# Patient Record
Sex: Female | Born: 1935 | Race: White | Hispanic: No | Marital: Married | Smoking: Never smoker
Health system: Southern US, Community
[De-identification: ages and names within clinical notes are randomized; demographics above are authoritative.]

## PROBLEM LIST (undated history)

## (undated) DIAGNOSIS — K589 Irritable bowel syndrome without diarrhea: Secondary | ICD-10-CM

## (undated) DIAGNOSIS — J984 Other disorders of lung: Secondary | ICD-10-CM

## (undated) DIAGNOSIS — M542 Cervicalgia: Secondary | ICD-10-CM

## (undated) DIAGNOSIS — I495 Sick sinus syndrome: Secondary | ICD-10-CM

## (undated) DIAGNOSIS — A150 Tuberculosis of lung: Secondary | ICD-10-CM

## (undated) DIAGNOSIS — M549 Dorsalgia, unspecified: Secondary | ICD-10-CM

## (undated) DIAGNOSIS — G43909 Migraine, unspecified, not intractable, without status migrainosus: Secondary | ICD-10-CM

## (undated) DIAGNOSIS — I4891 Unspecified atrial fibrillation: Secondary | ICD-10-CM

## (undated) DIAGNOSIS — K219 Gastro-esophageal reflux disease without esophagitis: Secondary | ICD-10-CM

## (undated) DIAGNOSIS — J449 Chronic obstructive pulmonary disease, unspecified: Secondary | ICD-10-CM

## (undated) DIAGNOSIS — R109 Unspecified abdominal pain: Secondary | ICD-10-CM

## (undated) DIAGNOSIS — M810 Age-related osteoporosis without current pathological fracture: Secondary | ICD-10-CM

## (undated) DIAGNOSIS — D649 Anemia, unspecified: Secondary | ICD-10-CM

## (undated) DIAGNOSIS — Z933 Colostomy status: Secondary | ICD-10-CM

## (undated) DIAGNOSIS — N289 Disorder of kidney and ureter, unspecified: Secondary | ICD-10-CM

## (undated) DIAGNOSIS — G8929 Other chronic pain: Secondary | ICD-10-CM

## (undated) DIAGNOSIS — F411 Generalized anxiety disorder: Secondary | ICD-10-CM

## (undated) DIAGNOSIS — K3184 Gastroparesis: Secondary | ICD-10-CM

## (undated) HISTORY — DX: Generalized anxiety disorder: F41.1

## (undated) HISTORY — PX: APPENDECTOMY: SHX54

## (undated) HISTORY — DX: Gastro-esophageal reflux disease without esophagitis: K21.9

## (undated) HISTORY — DX: Tuberculosis of lung: A15.0

## (undated) HISTORY — PX: COLOSTOMY: SHX63

## (undated) HISTORY — PX: ESOPHAGOGASTRODUODENOSCOPY: SHX1529

## (undated) HISTORY — PX: CHOLECYSTECTOMY: SHX55

## (undated) HISTORY — PX: TONSILLECTOMY: SUR1361

## (undated) HISTORY — DX: Age-related osteoporosis without current pathological fracture: M81.0

## (undated) HISTORY — DX: Disorder of kidney and ureter, unspecified: N28.9

## (undated) HISTORY — DX: Other disorders of lung: J98.4

## (undated) HISTORY — DX: Chronic obstructive pulmonary disease, unspecified: J44.9

## (undated) HISTORY — DX: Migraine, unspecified, not intractable, without status migrainosus: G43.909

## (undated) HISTORY — DX: Anemia, unspecified: D64.9

## (undated) HISTORY — PX: ABDOMINAL HYSTERECTOMY: SHX81

## (undated) HISTORY — DX: Irritable bowel syndrome, unspecified: K58.9

---

## 1965-07-15 HISTORY — PX: PARTIAL GASTRECTOMY: SHX2172

## 2000-10-23 ENCOUNTER — Encounter: Payer: Self-pay | Admitting: Podiatry

## 2000-10-23 ENCOUNTER — Ambulatory Visit (HOSPITAL_COMMUNITY): Admission: RE | Admit: 2000-10-23 | Discharge: 2000-10-23 | Payer: Self-pay | Admitting: Podiatry

## 2001-01-01 ENCOUNTER — Encounter: Payer: Self-pay | Admitting: Family Medicine

## 2001-01-01 ENCOUNTER — Ambulatory Visit (HOSPITAL_COMMUNITY): Admission: RE | Admit: 2001-01-01 | Discharge: 2001-01-01 | Payer: Self-pay | Admitting: Family Medicine

## 2002-02-16 ENCOUNTER — Ambulatory Visit (HOSPITAL_COMMUNITY): Admission: RE | Admit: 2002-02-16 | Discharge: 2002-02-16 | Payer: Self-pay | Admitting: Family Medicine

## 2002-02-16 ENCOUNTER — Encounter: Payer: Self-pay | Admitting: Family Medicine

## 2002-07-14 ENCOUNTER — Encounter: Payer: Self-pay | Admitting: Family Medicine

## 2002-07-14 ENCOUNTER — Inpatient Hospital Stay (HOSPITAL_COMMUNITY): Admission: EM | Admit: 2002-07-14 | Discharge: 2002-07-18 | Payer: Self-pay | Admitting: *Deleted

## 2002-07-15 ENCOUNTER — Encounter: Payer: Self-pay | Admitting: General Surgery

## 2002-07-16 ENCOUNTER — Encounter: Payer: Self-pay | Admitting: General Surgery

## 2002-07-17 ENCOUNTER — Encounter: Payer: Self-pay | Admitting: General Surgery

## 2002-12-29 ENCOUNTER — Encounter (HOSPITAL_COMMUNITY): Admission: RE | Admit: 2002-12-29 | Discharge: 2003-01-28 | Payer: Self-pay | Admitting: Orthopedic Surgery

## 2003-02-03 ENCOUNTER — Encounter: Payer: Self-pay | Admitting: Family Medicine

## 2003-02-03 ENCOUNTER — Ambulatory Visit (HOSPITAL_COMMUNITY): Admission: RE | Admit: 2003-02-03 | Discharge: 2003-02-03 | Payer: Self-pay | Admitting: Family Medicine

## 2003-03-01 ENCOUNTER — Ambulatory Visit (HOSPITAL_COMMUNITY): Admission: RE | Admit: 2003-03-01 | Discharge: 2003-03-01 | Payer: Self-pay | Admitting: Family Medicine

## 2003-03-01 ENCOUNTER — Encounter: Payer: Self-pay | Admitting: Family Medicine

## 2003-04-19 ENCOUNTER — Encounter: Payer: Self-pay | Admitting: Family Medicine

## 2003-04-19 ENCOUNTER — Ambulatory Visit (HOSPITAL_COMMUNITY): Admission: RE | Admit: 2003-04-19 | Discharge: 2003-04-19 | Payer: Self-pay | Admitting: Family Medicine

## 2003-04-20 ENCOUNTER — Ambulatory Visit (HOSPITAL_COMMUNITY): Admission: RE | Admit: 2003-04-20 | Discharge: 2003-04-20 | Payer: Self-pay | Admitting: Family Medicine

## 2003-04-20 ENCOUNTER — Encounter: Payer: Self-pay | Admitting: Family Medicine

## 2003-06-06 ENCOUNTER — Encounter (HOSPITAL_COMMUNITY): Admission: RE | Admit: 2003-06-06 | Discharge: 2003-07-06 | Payer: Self-pay | Admitting: Orthopaedic Surgery

## 2003-07-04 ENCOUNTER — Encounter (HOSPITAL_COMMUNITY): Admission: RE | Admit: 2003-07-04 | Discharge: 2003-08-03 | Payer: Self-pay | Admitting: Orthopaedic Surgery

## 2003-07-18 ENCOUNTER — Encounter (HOSPITAL_COMMUNITY): Admission: RE | Admit: 2003-07-18 | Discharge: 2003-08-18 | Payer: Self-pay | Admitting: Orthopaedic Surgery

## 2003-07-29 ENCOUNTER — Ambulatory Visit (HOSPITAL_COMMUNITY): Admission: RE | Admit: 2003-07-29 | Discharge: 2003-07-29 | Payer: Self-pay | Admitting: Family Medicine

## 2003-09-14 ENCOUNTER — Ambulatory Visit (HOSPITAL_COMMUNITY): Admission: RE | Admit: 2003-09-14 | Discharge: 2003-09-14 | Payer: Self-pay | Admitting: Family Medicine

## 2004-02-29 ENCOUNTER — Ambulatory Visit (HOSPITAL_COMMUNITY): Admission: RE | Admit: 2004-02-29 | Discharge: 2004-02-29 | Payer: Self-pay | Admitting: Family Medicine

## 2004-11-21 ENCOUNTER — Ambulatory Visit (HOSPITAL_COMMUNITY): Admission: RE | Admit: 2004-11-21 | Discharge: 2004-11-21 | Payer: Self-pay | Admitting: Family Medicine

## 2004-12-12 ENCOUNTER — Ambulatory Visit (HOSPITAL_COMMUNITY): Admission: RE | Admit: 2004-12-12 | Discharge: 2004-12-12 | Payer: Self-pay | Admitting: Family Medicine

## 2005-02-21 ENCOUNTER — Ambulatory Visit (HOSPITAL_COMMUNITY): Admission: RE | Admit: 2005-02-21 | Discharge: 2005-02-21 | Payer: Self-pay | Admitting: Urology

## 2005-04-30 ENCOUNTER — Ambulatory Visit (HOSPITAL_COMMUNITY): Admission: RE | Admit: 2005-04-30 | Discharge: 2005-04-30 | Payer: Self-pay | Admitting: Family Medicine

## 2005-08-13 ENCOUNTER — Ambulatory Visit: Payer: Self-pay | Admitting: Internal Medicine

## 2005-08-22 ENCOUNTER — Ambulatory Visit: Payer: Self-pay | Admitting: Internal Medicine

## 2005-08-22 ENCOUNTER — Ambulatory Visit (HOSPITAL_COMMUNITY): Admission: RE | Admit: 2005-08-22 | Discharge: 2005-08-22 | Payer: Self-pay | Admitting: Internal Medicine

## 2005-09-27 ENCOUNTER — Inpatient Hospital Stay (HOSPITAL_COMMUNITY): Admission: EM | Admit: 2005-09-27 | Discharge: 2005-10-07 | Payer: Self-pay | Admitting: Emergency Medicine

## 2005-10-23 ENCOUNTER — Ambulatory Visit: Payer: Self-pay | Admitting: Internal Medicine

## 2005-10-28 ENCOUNTER — Encounter (HOSPITAL_COMMUNITY): Admission: RE | Admit: 2005-10-28 | Discharge: 2005-10-28 | Payer: Self-pay | Admitting: Internal Medicine

## 2005-11-12 ENCOUNTER — Ambulatory Visit: Payer: Self-pay | Admitting: Internal Medicine

## 2005-12-23 ENCOUNTER — Ambulatory Visit: Payer: Self-pay | Admitting: Internal Medicine

## 2005-12-26 ENCOUNTER — Ambulatory Visit (HOSPITAL_COMMUNITY): Admission: RE | Admit: 2005-12-26 | Discharge: 2005-12-26 | Payer: Self-pay | Admitting: Internal Medicine

## 2006-02-24 ENCOUNTER — Ambulatory Visit (HOSPITAL_COMMUNITY): Admission: RE | Admit: 2006-02-24 | Discharge: 2006-02-24 | Payer: Self-pay | Admitting: Family Medicine

## 2006-03-07 ENCOUNTER — Ambulatory Visit (HOSPITAL_COMMUNITY): Admission: RE | Admit: 2006-03-07 | Discharge: 2006-03-07 | Payer: Self-pay | Admitting: Family Medicine

## 2006-03-26 ENCOUNTER — Ambulatory Visit: Payer: Self-pay | Admitting: Internal Medicine

## 2006-08-12 ENCOUNTER — Ambulatory Visit (HOSPITAL_COMMUNITY): Admission: RE | Admit: 2006-08-12 | Discharge: 2006-08-12 | Payer: Self-pay | Admitting: Family Medicine

## 2006-10-08 ENCOUNTER — Ambulatory Visit: Payer: Self-pay | Admitting: Internal Medicine

## 2006-10-21 ENCOUNTER — Ambulatory Visit (HOSPITAL_COMMUNITY): Admission: RE | Admit: 2006-10-21 | Discharge: 2006-10-21 | Payer: Self-pay | Admitting: Family Medicine

## 2007-01-21 ENCOUNTER — Ambulatory Visit (HOSPITAL_COMMUNITY): Admission: RE | Admit: 2007-01-21 | Discharge: 2007-01-22 | Payer: Self-pay | Admitting: Neurological Surgery

## 2007-07-29 ENCOUNTER — Ambulatory Visit (HOSPITAL_COMMUNITY): Admission: RE | Admit: 2007-07-29 | Discharge: 2007-07-29 | Payer: Self-pay | Admitting: Family Medicine

## 2007-07-29 ENCOUNTER — Encounter: Payer: Self-pay | Admitting: Orthopedic Surgery

## 2007-08-24 ENCOUNTER — Ambulatory Visit: Payer: Self-pay | Admitting: Orthopedic Surgery

## 2007-08-24 DIAGNOSIS — M5430 Sciatica, unspecified side: Secondary | ICD-10-CM

## 2007-09-29 ENCOUNTER — Ambulatory Visit: Payer: Self-pay | Admitting: Orthopedic Surgery

## 2007-09-30 ENCOUNTER — Telehealth: Payer: Self-pay | Admitting: Orthopedic Surgery

## 2007-10-22 ENCOUNTER — Ambulatory Visit (HOSPITAL_COMMUNITY): Admission: RE | Admit: 2007-10-22 | Discharge: 2007-10-22 | Payer: Self-pay | Admitting: Neurological Surgery

## 2007-10-27 ENCOUNTER — Encounter: Admission: RE | Admit: 2007-10-27 | Discharge: 2007-10-27 | Payer: Self-pay | Admitting: Neurological Surgery

## 2007-11-19 ENCOUNTER — Ambulatory Visit (HOSPITAL_COMMUNITY): Admission: RE | Admit: 2007-11-19 | Discharge: 2007-11-20 | Payer: Self-pay | Admitting: Neurological Surgery

## 2007-11-24 ENCOUNTER — Encounter: Admission: RE | Admit: 2007-11-24 | Discharge: 2007-11-24 | Payer: Self-pay | Admitting: Neurological Surgery

## 2007-11-27 ENCOUNTER — Encounter: Admission: RE | Admit: 2007-11-27 | Discharge: 2007-11-27 | Payer: Self-pay | Admitting: Neurological Surgery

## 2008-02-03 ENCOUNTER — Emergency Department (HOSPITAL_COMMUNITY): Admission: EM | Admit: 2008-02-03 | Discharge: 2008-02-03 | Payer: Self-pay | Admitting: Emergency Medicine

## 2008-02-12 ENCOUNTER — Ambulatory Visit (HOSPITAL_COMMUNITY): Admission: RE | Admit: 2008-02-12 | Discharge: 2008-02-12 | Payer: Self-pay | Admitting: Family Medicine

## 2008-06-20 ENCOUNTER — Ambulatory Visit (HOSPITAL_COMMUNITY): Admission: RE | Admit: 2008-06-20 | Discharge: 2008-06-20 | Payer: Self-pay | Admitting: Family Medicine

## 2008-06-29 ENCOUNTER — Inpatient Hospital Stay (HOSPITAL_COMMUNITY): Admission: EM | Admit: 2008-06-29 | Discharge: 2008-07-04 | Payer: Self-pay | Admitting: Emergency Medicine

## 2008-11-24 ENCOUNTER — Ambulatory Visit (HOSPITAL_COMMUNITY): Admission: RE | Admit: 2008-11-24 | Discharge: 2008-11-24 | Payer: Self-pay | Admitting: Family Medicine

## 2008-11-30 ENCOUNTER — Ambulatory Visit (HOSPITAL_COMMUNITY): Admission: RE | Admit: 2008-11-30 | Discharge: 2008-11-30 | Payer: Self-pay | Admitting: Family Medicine

## 2009-01-11 ENCOUNTER — Ambulatory Visit (HOSPITAL_COMMUNITY): Admission: RE | Admit: 2009-01-11 | Discharge: 2009-01-11 | Payer: Self-pay | Admitting: Pulmonary Disease

## 2009-08-11 ENCOUNTER — Emergency Department (HOSPITAL_COMMUNITY): Admission: EM | Admit: 2009-08-11 | Discharge: 2009-08-11 | Payer: Self-pay | Admitting: Emergency Medicine

## 2009-09-22 ENCOUNTER — Ambulatory Visit (HOSPITAL_COMMUNITY): Admission: RE | Admit: 2009-09-22 | Discharge: 2009-09-22 | Payer: Self-pay | Admitting: Family Medicine

## 2010-01-26 ENCOUNTER — Emergency Department (HOSPITAL_COMMUNITY): Admission: EM | Admit: 2010-01-26 | Discharge: 2010-01-26 | Payer: Self-pay | Admitting: Emergency Medicine

## 2010-02-23 ENCOUNTER — Ambulatory Visit (HOSPITAL_COMMUNITY): Admission: RE | Admit: 2010-02-23 | Discharge: 2010-02-23 | Payer: Self-pay | Admitting: Internal Medicine

## 2010-03-07 ENCOUNTER — Ambulatory Visit (HOSPITAL_COMMUNITY): Admission: RE | Admit: 2010-03-07 | Discharge: 2010-03-07 | Payer: Self-pay | Admitting: Family Medicine

## 2010-03-26 ENCOUNTER — Inpatient Hospital Stay (HOSPITAL_COMMUNITY): Admission: EM | Admit: 2010-03-26 | Discharge: 2010-04-04 | Payer: Self-pay | Admitting: Emergency Medicine

## 2010-04-24 ENCOUNTER — Ambulatory Visit: Payer: Self-pay | Admitting: Internal Medicine

## 2010-07-02 ENCOUNTER — Ambulatory Visit (HOSPITAL_COMMUNITY)
Admission: RE | Admit: 2010-07-02 | Discharge: 2010-07-02 | Payer: Self-pay | Source: Home / Self Care | Attending: Family Medicine | Admitting: Family Medicine

## 2010-08-05 ENCOUNTER — Encounter: Payer: Self-pay | Admitting: Family Medicine

## 2010-08-21 ENCOUNTER — Ambulatory Visit (INDEPENDENT_AMBULATORY_CARE_PROVIDER_SITE_OTHER): Payer: Medicare Other | Admitting: Internal Medicine

## 2010-08-21 DIAGNOSIS — R109 Unspecified abdominal pain: Secondary | ICD-10-CM

## 2010-08-21 DIAGNOSIS — K3184 Gastroparesis: Secondary | ICD-10-CM

## 2010-09-04 ENCOUNTER — Other Ambulatory Visit (HOSPITAL_COMMUNITY): Payer: Self-pay | Admitting: Family Medicine

## 2010-09-04 ENCOUNTER — Encounter (HOSPITAL_COMMUNITY): Payer: Self-pay

## 2010-09-04 ENCOUNTER — Ambulatory Visit (HOSPITAL_COMMUNITY)
Admission: RE | Admit: 2010-09-04 | Discharge: 2010-09-04 | Disposition: A | Discharge status: Home / Self Care | Payer: Medicare Other | Source: Ambulatory Visit | Attending: Family Medicine | Admitting: Family Medicine

## 2010-09-04 DIAGNOSIS — M545 Low back pain, unspecified: Secondary | ICD-10-CM | POA: Insufficient documentation

## 2010-09-04 DIAGNOSIS — M47817 Spondylosis without myelopathy or radiculopathy, lumbosacral region: Secondary | ICD-10-CM | POA: Insufficient documentation

## 2010-09-04 DIAGNOSIS — M899 Disorder of bone, unspecified: Secondary | ICD-10-CM | POA: Insufficient documentation

## 2010-09-04 DIAGNOSIS — M549 Dorsalgia, unspecified: Secondary | ICD-10-CM

## 2010-09-04 DIAGNOSIS — M949 Disorder of cartilage, unspecified: Secondary | ICD-10-CM | POA: Insufficient documentation

## 2010-09-14 ENCOUNTER — Inpatient Hospital Stay (HOSPITAL_COMMUNITY)
Admission: EM | Admit: 2010-09-14 | Discharge: 2010-09-19 | DRG: 194 | Disposition: A | Discharge status: Home Health | Payer: Medicare Other | Attending: Internal Medicine | Admitting: Internal Medicine

## 2010-09-14 ENCOUNTER — Emergency Department (HOSPITAL_COMMUNITY): Payer: Medicare Other

## 2010-09-14 DIAGNOSIS — E039 Hypothyroidism, unspecified: Secondary | ICD-10-CM | POA: Diagnosis present

## 2010-09-14 DIAGNOSIS — J189 Pneumonia, unspecified organism: Principal | ICD-10-CM | POA: Diagnosis present

## 2010-09-14 DIAGNOSIS — E119 Type 2 diabetes mellitus without complications: Secondary | ICD-10-CM | POA: Diagnosis present

## 2010-09-14 DIAGNOSIS — R000 Tachycardia, unspecified: Secondary | ICD-10-CM | POA: Diagnosis not present

## 2010-09-14 DIAGNOSIS — R5381 Other malaise: Secondary | ICD-10-CM | POA: Diagnosis present

## 2010-09-14 DIAGNOSIS — F3289 Other specified depressive episodes: Secondary | ICD-10-CM | POA: Diagnosis present

## 2010-09-14 DIAGNOSIS — E872 Acidosis, unspecified: Secondary | ICD-10-CM | POA: Diagnosis present

## 2010-09-14 DIAGNOSIS — R0902 Hypoxemia: Secondary | ICD-10-CM | POA: Diagnosis present

## 2010-09-14 DIAGNOSIS — F329 Major depressive disorder, single episode, unspecified: Secondary | ICD-10-CM | POA: Diagnosis present

## 2010-09-14 DIAGNOSIS — R031 Nonspecific low blood-pressure reading: Secondary | ICD-10-CM | POA: Diagnosis present

## 2010-09-14 DIAGNOSIS — R911 Solitary pulmonary nodule: Secondary | ICD-10-CM | POA: Diagnosis present

## 2010-09-14 DIAGNOSIS — N289 Disorder of kidney and ureter, unspecified: Secondary | ICD-10-CM | POA: Diagnosis present

## 2010-09-14 DIAGNOSIS — E876 Hypokalemia: Secondary | ICD-10-CM | POA: Diagnosis not present

## 2010-09-14 DIAGNOSIS — J0110 Acute frontal sinusitis, unspecified: Secondary | ICD-10-CM | POA: Diagnosis present

## 2010-09-14 DIAGNOSIS — J441 Chronic obstructive pulmonary disease with (acute) exacerbation: Secondary | ICD-10-CM | POA: Diagnosis present

## 2010-09-14 LAB — DIFFERENTIAL
Basophils Absolute: 0 10*3/uL (ref 0.0–0.1)
Basophils Relative: 0 % (ref 0–1)
Eosinophils Absolute: 0 10*3/uL (ref 0.0–0.7)
Eosinophils Relative: 0 % (ref 0–5)
Metamyelocytes Relative: 0 %
Monocytes Absolute: 1.8 10*3/uL — ABNORMAL HIGH (ref 0.1–1.0)
Monocytes Relative: 8 % (ref 3–12)
Myelocytes: 0 %

## 2010-09-14 LAB — BASIC METABOLIC PANEL
CO2: 19 mEq/L (ref 19–32)
Calcium: 9.2 mg/dL (ref 8.4–10.5)
GFR calc non Af Amer: 30 mL/min — ABNORMAL LOW (ref >60)
Glucose, Bld: 217 mg/dL — ABNORMAL HIGH (ref 70–99)
Potassium: 4.2 mEq/L (ref 3.5–5.1)
Sodium: 134 mEq/L — ABNORMAL LOW (ref 135–145)

## 2010-09-14 LAB — LACTIC ACID, PLASMA: Lactic Acid, Venous: 2.5 mmol/L — ABNORMAL HIGH (ref 0.5–2.2)

## 2010-09-14 LAB — GLUCOSE, CAPILLARY: Glucose-Capillary: 204 mg/dL — ABNORMAL HIGH (ref 70–99)

## 2010-09-14 LAB — CBC
Hemoglobin: 14.9 g/dL (ref 12.0–15.0)
MCH: 32.5 pg (ref 26.0–34.0)
MCHC: 33.3 g/dL (ref 30.0–36.0)
Platelets: 243 10*3/uL (ref 150–400)
RBC: 4.59 MIL/uL (ref 3.87–5.11)

## 2010-09-14 LAB — HEPATIC FUNCTION PANEL
AST: 24 U/L (ref 0–37)
Albumin: 3 g/dL — ABNORMAL LOW (ref 3.5–5.2)
Total Bilirubin: 1.2 mg/dL (ref 0.3–1.2)
Total Protein: 6.5 g/dL (ref 6.0–8.3)

## 2010-09-14 LAB — CARDIAC PANEL(CRET KIN+CKTOT+MB+TROPI): Relative Index: INVALID (ref 0.0–2.5)

## 2010-09-15 ENCOUNTER — Inpatient Hospital Stay (HOSPITAL_COMMUNITY): Payer: Medicare Other

## 2010-09-15 LAB — DIFFERENTIAL
Eosinophils Relative: 0 % (ref 0–5)
Monocytes Absolute: 2.4 10*3/uL — ABNORMAL HIGH (ref 0.1–1.0)
Monocytes Relative: 12 % (ref 3–12)
Neutrophils Relative %: 81 % — ABNORMAL HIGH (ref 43–77)
WBC Morphology: INCREASED

## 2010-09-15 LAB — CBC
HCT: 41.5 % (ref 36.0–46.0)
Platelets: 237 10*3/uL (ref 150–400)
RDW: 14.9 % (ref 11.5–15.5)
WBC: 20 10*3/uL — ABNORMAL HIGH (ref 4.0–10.5)

## 2010-09-15 LAB — TSH: TSH: 0.8 u[IU]/mL (ref 0.350–4.500)

## 2010-09-15 LAB — PHOSPHORUS: Phosphorus: 2.3 mg/dL (ref 2.3–4.6)

## 2010-09-15 LAB — CARDIAC PANEL(CRET KIN+CKTOT+MB+TROPI)
CK, MB: 1.9 ng/mL (ref 0.3–4.0)
Relative Index: INVALID (ref 0.0–2.5)
Relative Index: INVALID (ref 0.0–2.5)
Total CK: 37 U/L (ref 7–177)
Troponin I: 0.03 ng/mL (ref 0.00–0.06)

## 2010-09-15 LAB — BASIC METABOLIC PANEL
GFR calc Af Amer: 48 mL/min — ABNORMAL LOW (ref >60)
GFR calc non Af Amer: 40 mL/min — ABNORMAL LOW (ref >60)
Glucose, Bld: 159 mg/dL — ABNORMAL HIGH (ref 70–99)
Potassium: 3.8 mEq/L (ref 3.5–5.1)
Sodium: 140 mEq/L (ref 135–145)

## 2010-09-15 LAB — LACTIC ACID, PLASMA: Lactic Acid, Venous: 2.3 mmol/L — ABNORMAL HIGH (ref 0.5–2.2)

## 2010-09-15 LAB — T4, FREE: Free T4: 1.18 ng/dL (ref 0.80–1.80)

## 2010-09-15 LAB — GLUCOSE, CAPILLARY: Glucose-Capillary: 188 mg/dL — ABNORMAL HIGH (ref 70–99)

## 2010-09-16 LAB — DIFFERENTIAL
Basophils Absolute: 0.2 10*3/uL — ABNORMAL HIGH (ref 0.0–0.1)
Eosinophils Absolute: 0.3 10*3/uL (ref 0.0–0.7)
Lymphocytes Relative: 16 % (ref 12–46)
Monocytes Absolute: 2.3 10*3/uL — ABNORMAL HIGH (ref 0.1–1.0)
Neutrophils Relative %: 66 % (ref 43–77)

## 2010-09-16 LAB — MAGNESIUM: Magnesium: 1.4 mg/dL — ABNORMAL LOW (ref 1.5–2.5)

## 2010-09-16 LAB — GLUCOSE, CAPILLARY: Glucose-Capillary: 149 mg/dL — ABNORMAL HIGH (ref 70–99)

## 2010-09-16 LAB — CBC
Hemoglobin: 13.8 g/dL (ref 12.0–15.0)
MCHC: 32.6 g/dL (ref 30.0–36.0)
Platelets: 251 10*3/uL (ref 150–400)
RBC: 4.3 MIL/uL (ref 3.87–5.11)

## 2010-09-16 LAB — BASIC METABOLIC PANEL
BUN: 11 mg/dL (ref 6–23)
Chloride: 115 mEq/L — ABNORMAL HIGH (ref 96–112)
Creatinine, Ser: 0.91 mg/dL (ref 0.4–1.2)
GFR calc Af Amer: >60 mL/min (ref >60)
GFR calc non Af Amer: 60 mL/min — ABNORMAL LOW (ref >60)
Potassium: 4.3 mEq/L (ref 3.5–5.1)

## 2010-09-16 LAB — BRAIN NATRIURETIC PEPTIDE: Pro B Natriuretic peptide (BNP): 946 pg/mL — ABNORMAL HIGH (ref 0.0–100.0)

## 2010-09-17 LAB — DIFFERENTIAL
Eosinophils Relative: 3 % (ref 0–5)
Lymphocytes Relative: 16 % (ref 12–46)
Lymphs Abs: 2.5 10*3/uL (ref 0.7–4.0)
Neutrophils Relative %: 68 % (ref 43–77)

## 2010-09-17 LAB — GLUCOSE, CAPILLARY
Glucose-Capillary: 130 mg/dL — ABNORMAL HIGH (ref 70–99)
Glucose-Capillary: 243 mg/dL — ABNORMAL HIGH (ref 70–99)

## 2010-09-17 LAB — CBC
HCT: 38.9 % (ref 36.0–46.0)
Hemoglobin: 12.8 g/dL (ref 12.0–15.0)
RBC: 3.98 MIL/uL (ref 3.87–5.11)
WBC: 15.9 10*3/uL — ABNORMAL HIGH (ref 4.0–10.5)

## 2010-09-17 LAB — BASIC METABOLIC PANEL
GFR calc non Af Amer: >60 mL/min (ref >60)
Glucose, Bld: 135 mg/dL — ABNORMAL HIGH (ref 70–99)
Potassium: 3.4 mEq/L — ABNORMAL LOW (ref 3.5–5.1)
Sodium: 137 mEq/L (ref 135–145)

## 2010-09-18 LAB — BASIC METABOLIC PANEL
BUN: 10 mg/dL (ref 6–23)
Creatinine, Ser: 0.98 mg/dL (ref 0.4–1.2)
GFR calc non Af Amer: 55 mL/min — ABNORMAL LOW (ref >60)

## 2010-09-18 LAB — DIFFERENTIAL
Basophils Absolute: 0.4 10*3/uL — ABNORMAL HIGH (ref 0.0–0.1)
Eosinophils Absolute: 0.5 10*3/uL (ref 0.0–0.7)
Eosinophils Relative: 3 % (ref 0–5)
Monocytes Absolute: 1.6 10*3/uL — ABNORMAL HIGH (ref 0.1–1.0)

## 2010-09-18 LAB — GLUCOSE, CAPILLARY: Glucose-Capillary: 143 mg/dL — ABNORMAL HIGH (ref 70–99)

## 2010-09-18 LAB — CBC
HCT: 42.6 % (ref 36.0–46.0)
MCH: 32.1 pg (ref 26.0–34.0)
MCHC: 32.6 g/dL (ref 30.0–36.0)
RDW: 14.7 % (ref 11.5–15.5)

## 2010-09-18 LAB — VANCOMYCIN, TROUGH: Vancomycin Tr: 23.1 ug/mL — ABNORMAL HIGH (ref 10.0–20.0)

## 2010-09-18 LAB — MAGNESIUM: Magnesium: 1.5 mg/dL (ref 1.5–2.5)

## 2010-09-19 LAB — BLOOD GAS, ARTERIAL
Bicarbonate: 28.2 mEq/L — ABNORMAL HIGH (ref 20.0–24.0)
FIO2: 0.21 %
O2 Saturation: 88.8 %
pCO2 arterial: 45.3 mmHg — ABNORMAL HIGH (ref 35.0–45.0)
pH, Arterial: 7.411 — ABNORMAL HIGH (ref 7.350–7.400)
pO2, Arterial: 54.2 mmHg — ABNORMAL LOW (ref 80.0–100.0)

## 2010-09-19 LAB — DIFFERENTIAL
Eosinophils Absolute: 0.6 10*3/uL (ref 0.0–0.7)
Lymphocytes Relative: 19 % (ref 12–46)
Lymphs Abs: 3.1 10*3/uL (ref 0.7–4.0)
Monocytes Relative: 8 % (ref 3–12)
Neutro Abs: 11 10*3/uL — ABNORMAL HIGH (ref 1.7–7.7)
Neutrophils Relative %: 68 % (ref 43–77)

## 2010-09-19 LAB — BASIC METABOLIC PANEL
BUN: 14 mg/dL (ref 6–23)
Chloride: 103 mEq/L (ref 96–112)
GFR calc non Af Amer: >60 mL/min (ref >60)
Potassium: 4.9 mEq/L (ref 3.5–5.1)
Sodium: 140 mEq/L (ref 135–145)

## 2010-09-19 LAB — CULTURE, BLOOD (ROUTINE X 2): Culture: NO GROWTH

## 2010-09-19 LAB — GLUCOSE, CAPILLARY: Glucose-Capillary: 131 mg/dL — ABNORMAL HIGH (ref 70–99)

## 2010-09-19 LAB — CBC
HCT: 36.3 % (ref 36.0–46.0)
Hemoglobin: 12.2 g/dL (ref 12.0–15.0)
RBC: 3.73 MIL/uL — ABNORMAL LOW (ref 3.87–5.11)
WBC: 16.2 10*3/uL — ABNORMAL HIGH (ref 4.0–10.5)

## 2010-09-20 LAB — GLUCOSE, CAPILLARY: Glucose-Capillary: 126 mg/dL — ABNORMAL HIGH (ref 70–99)

## 2010-09-20 NOTE — H&P (Signed)
[name], [name]                ACCOUNT NO.:  [id number]  MEDICAL RECORD NO.:  [id number]           PATIENT TYPE:  I  LOCATION:  A307                          FACILITY:  [facility]  PHYSICIAN:  Karmah Potocki L. Lendell Caprice, MDDATE OF BIRTH:  1928-10-22  DATE OF ADMISSION:  09/14/2010 DATE OF DISCHARGE:  LH                             HISTORY & PHYSICAL   CHIEF COMPLAINT:  Sinus pressure and cough.  HISTORY OF PRESENT ILLNESS:  [name] is a 77 year old white female with a history of COPD, who presents with the above symptoms.  She has been feeling sick for a week.  She has frontal sinus pressure, pain, congestion.  She also has a cough productive of green sputum.  She has been short of breath with wheezing.  Her appetite has been poor.  She has had no vomiting or diarrhea.  She has had no sick contacts.  In the emergency room, she was found to have right upper lobe pneumonia and mild hypoxia when off oxygen.  Her room air saturations were about 88%, but 95% on 2 liters.  Also, her blood pressure initially was 86/50. After IV fluids, her pressure is 108/57.  She has no other complaints.  PAST MEDICAL HISTORY: 1. COPD. 2. Hypertension. 3. History of severe peptic ulcer disease, requiring multiple     procedures including colostomy and gastric resection. 4. Chronic headaches.  MEDICATIONS: 1. Metoprolol 50 mg twice a day. 2. KCl 20 mEq a day. 3. Furosemide 20 mg as needed for fluid retention. 4. Cenestin 0.625 mg a day. 5. Effexor XR 75 mg a day. 6. Prevacid 30 mg a day. 7. Atrovent 2 puffs q.i.d. 8. Domperidone 10 mg twice a day. 9. Fosamax 70 mg weekly. 10.Albuterol 2 puffs q.i.d.  ALLERGIES:  She is allergic to TETANUS SHOTS.  SOCIAL HISTORY:  She has a remote history of smoking.  She is here with her husband.  She does not drink or have a drug history.  A CNA comes to assist in her care several hours each day.  FAMILY HISTORY:  Noncontributory.  REVIEW OF SYSTEMS:  Systems  reviewed and as above, otherwise negative.  PHYSICAL EXAMINATION:  VITAL SIGNS:  Temperature is 98.6; blood pressure initially 86/50, currently 108/57; pulse 96; respiratory rate initially 40, currently about 20; oxygen saturations currently 95% on room air. GENERAL:  The patient is a chronically ill-appearing, thin white female, who is able to speak in short sentences. HEENT:  Pupils are equal, round, and reactive to light.  Sclerae nonicteric.  Normocephalic, atraumatic.  Moist mucous membranes. Oropharynx is without erythema or exudate. NECK:  Supple.  No carotid bruits.  No thyromegaly.  No JVD. LUNGS:  Diminished throughout without wheezes, rhonchi, or rales. CARDIOVASCULAR:  Regular rate and rhythm without murmurs, gallops, or rubs. ABDOMEN:  Soft, nontender.  Her colostomy bag was not removed. GU AND RECTAL:  Deferred. EXTREMITIES:  No clubbing, cyanosis, or edema.  Pulses are intact. SKIN:  No rash. PSYCHIATRIC:  Normal affect. NEUROLOGIC:  Alert and oriented.  Cranial nerves and sensory motor exam are intact.LABS:  White blood cell count is 22,900  with 81% neutrophils.  CBC is otherwise normal.  Complete metabolic panel significant for a glucose of 217, creatinine 1.65, BUN is 22, direct bilirubin 0.5, normal total bilirubin.  Venous lactic acid is 2.5.  Blood cultures are pending. Chest x-ray shows possible focal right upper lobe pneumonia versus mass, pleural calcification or scarring, contralateral density at the left apex is more typical of pleural parenchymal scarring.  ASSESSMENT AND PLAN: 1. Acute frontal sinusitis. 2. Community-acquired right upper lobe pneumonia:  The patient will be     on azithromycin and Rocephin for community-acquired pneumonia.  I     will add Flonase due to the sinusitis.  She is slightly hypoxic and     I will give oxygen and nebulizers. 3. Chronic obstructive pulmonary disease. 4. Colostomy. 5. Hypertension:  Hold her antihypertensives  due to transient     hypotension earlier. 6. Hyperglycemia:  The patient denies a history of diabetes.  I will     check a hemoglobin A1c and monitor blood glucoses twice a day for     now.  If her blood glucoses are consistently elevated, we can start     sliding scale insulin. 7. History of gastrectomy for peptic ulcer disease.  Continue proton     pump inhibitor and domperidone. 8. Chronic headaches:  She reports that this is why she takes Effexor.     I will continue this. 9. Transient hypotension.  She will get IV fluids.  Her creatinine is     elevated.  Two years ago, her creatinine was 1 and I suspect she     has acute renal insufficiency secondary to prerenal azotemia. 10.Mild venous lactic acidosis secondary to acute illness.  I will     check another level tomorrow.     Smriti Barkow L. Lendell Caprice, MD    CLS/MEDQ  D:  09/14/2010  T:  09/15/2010  Job:  [id number]  Electronically Signed by Crista Curb MD on 09/19/2010 07:48:49 PM

## 2010-09-21 NOTE — Discharge Summary (Signed)
NAMEAUSTRALIA, DROLL                ACCOUNT NO.:  [id number]  MEDICAL RECORD NO.:  [id number]           PATIENT TYPE:  I  LOCATION:  A307                          FACILITY:  [facility]  PHYSICIAN:  Elliot Cousin, M.D.    DATE OF BIRTH:  09/22/1928  DATE OF ADMISSION:  09/14/2010 DATE OF DISCHARGE:  03/07/2012LH                              DISCHARGE SUMMARY   DISCHARGE DIAGNOSES:    The patient has two medical record numbers. The first medical record number is [id number].  The second medical record number is [id number]. 1. Community-acquired pneumonia. 2. 2.1 cm spiculated lesion in the superior segment of the right upper     lobe, apparently stable compared with the CT scan of her chest in     December 2011 and in March 2011. 3. Diffuse and extensive tree-in-bud opacities in both lungs with     areas of parenchymal scarring. 4. Acute sinusitis. 5. Chronic obstructive pulmonary disease with mild exacerbation. 6. Hypoxemia.  The patient's ABG on room air on September 19, 2010,     revealed a pH of 7.4, pCO2 of 45.3, and pO2 of 54.2.  The patient     was discharged to home on oxygen. 7. Elevated D-dimer, secondary to acute sinusitis and pneumonia. 8. Elevated BNP without any evidence of pulmonary edema. 9. Hypotension, secondary to hypovolemia, resolved with IV fluids. 10.Hypertension with low normal blood pressures during the     hospitalization. 11.Sinus tachycardia, secondary to respiratory distress and     bronchodilator therapy. 12.Hypomagnesemia. 13.Hypokalemia. 14.Newly diagnosed type 2 diabetes mellitus.  The patient's hemoglobin     A1c was 6.7. 15.Mild lactic acidosis. 16.Hypokalemia. 17.Chronic debilitation. 18.Hypothyroidism. 19.Acute renal insufficiency.  DISCHARGE MEDICATIONS: 1. Albuterol inhaler 2 puffs four times daily. 2. Amoxicillin 500 mg one tablet b.i.d. for 5 more days. 3. Tessalon Perles 100 mg three times daily. 4. Tussionex 5 mL every 12 hours as needed for  cough. 5. Magnesium oxide 400 mg b.i.d. 6. Nasal cannula oxygen 2-3 liters per minute. 7. Z-Pak 5-day course to be taken as directed. 8. Furosemide 20 mg, the dose was reduced to half a tablet daily. 9. Artificial tears 1 drop daily as needed to both eyes. 10.Atrovent inhaler 2 puffs four times daily. 11.Domperidone 10 mg b.i.d. 12.Effexor XR 75 mg 3 tablets daily. 13.Estrogen 0.625 mg daily. 14.Fosamax 70 mg weekly. 15.Levothyroxine 50 mcg one tablet daily except half a tablet on     Mondays. 16.Metoprolol 25 mg b.i.d. 17.Namenda 10 mg b.i.d. 18.Protonix 40 mg b.i.d. 19.Potassium chloride 20 mEq daily. 20.Tylenol 325 mg one tablet every 6 hours as needed for pain. 21.Valium 10 mg half a tablet three times daily as needed.  DISCHARGE DISPOSITION:  The patient was discharged to home in improved and stable condition on September 19, 2010.  She will follow up with Dr. Lilyan Punt on September 25, 2010, at 8:10 a.m.  CONSULTATIONS:  Curbside consultation with Dr. Lilyan Punt.  PROCEDURE PERFORMED: 1. CT scan of the chest without contrast on September 15, 2010.  The     results revealed abnormalities seen on the recent chest x-ray  representing 2.1 cm spiculated lesion in the superior segment of     the right upper lobe.  There is associated diffuse and extensive     tree-in-bud opacities in both lungs and areas of parenchymal     scarring. 2. Chest x-ray on September 14, 2010.  The results revealed possible focal     right upper lobe pneumonia versus mass, pleural calcification or     scarring, contralateral density at the left apex is more typical of     pleural parenchymal scarring.  HISTORY OF PRESENT ILLNESS:  Of note, the patient does have two medical record numbers.  The patient is a 77 year old woman with a past medical history significant for COPD, stable right lung density, peptic ulcer disease, hypertension, depression, and chronic headaches.  She presented to the emergency  department on September 14, 2010, with a chief complaint of sinus pressure and cough.  When she was initially evaluated, her oxygen saturations on room air ranged in the 80s.  Her blood pressure was initially 86/50.  Her WBC was elevated 22.8.  Her lactic acid was mildly elevated at 2.5.  Her creatinine was 1.65 and her BUN was 22.  She was admitted for further evaluation and management.  HOSPITAL COURSE: 1. ACUTE FRONTAL SINUSITIS AND COMMUNITY-ACQUIRED PNEUMONIA.  Blood     cultures were ordered on admission.  They did remain negative     during the hospitalization.  She was started empirically on     Rocephin and azithromycin.  Albuterol and Atrovent nebulizers were     administered every 6 hours for mild bronchospasms.     Flonase and Afrin nasal spray were added empirically.  However when     she had a bout of hypertension and tachycardia, the Afrin nasal     spray was discontinued.  As needed Tussionex and     Tessalon Perles were administered for cough.  Oxygen was titrated     to keep her oxygen saturations greater than 90%.  Due to the     abnormality seen on the CXR, a CT scan of her chest was     ordered.  The results were dictated above.  There was a concern     about a neoplasm.  I discussed this finding with Dr. Lilyan Punt     as the patient told me that she had a CT scan that was performed     recently that showed stable findings.  However, I could not     find the results.  Dr. Gerda Diss informed me that the she     may have two medical record numbers and indeed she does.  I     read the report of the CT scan of the chest that was performed in     December 2011.  Apparently, the changes seen on the CT scan have     been stable.  Nevertheless, the patient did spike of fever on     Rocephin and azithromycin.  For this reason, Rocephin was     discontinued in favor of vancomycin along with cefepime.     Azithromycin was transitioned to the oral preparation.  The patient      improved symptomatically.  Her white blood cell count did decrease     to 16.2.  She became afebrile.  An ABG was ordered prior to     hospital discharge to assess for her need of oxygen.  The results     were dictated  above.  She was discharged to home on oxygen therapy.     She received a total of 5 days of antibiotic therapy intravenously.     She was discharged to home on 5 more days of amoxicillin and     azithromycin. 2. ACUTE RENAL FAILURE, HYPOMAGNESEMIA, HYPOKALEMIA, AND MILD LACTIC     ACIDOSIS.  The patient's electrolyte abnormalities were addressed.     She was repleted orally, intravenously, and in the IV fluids.     Prior to hospital discharge, her serum sodium was 140, her     potassium was 4.9, her CO2 was 30, her BUN was 14, and her     creatinine was 0.86. 3. HYPOTENSION.  The patient was hypotensive on admission.  Metoprolol     was withheld during the majority of the hospitalization.  However     when she became acutely tachycardic, it was restarted at a smaller     dose.  Upon discharge, when her blood pressure improved, she was     advised to restart metoprolol at 25 mg b.i.d., the dose which had     been prescribed prior to hospital admission. 4. Newly diagnosed type 2 diabetes mellitus.  The patient's venous     glucose was noted to be elevated.  A hemoglobin A1c was ordered.     It was 6.7.  I discussed this finding with her primary care     physician Dr. Lilyan Punt.  Given the patient's debility and     fragility and marginal p.o. intake, he recommended not to send the     patient home on insulin or an oral hypoglycemic agent.  He will     follow up and manage her diabetes in his office.  Nevertheless, the     patient was treated with sliding scale NovoLog during the     hospitalization.  The registered dietitian was also consulted.  She     provided the patient with diet instructions and generalized     education on diabetes mellitus.       Elliot Cousin, M.D.     DF/MEDQ  D:  09/19/2010  T:  09/20/2010  Job:  [id number]  cc:   Lorin Picket A. Gerda Diss, MD Fax: [phone]  Electronically Signed by Elliot Cousin M.D. on 09/20/2010 09:37:09 AM

## 2010-09-27 LAB — DIFFERENTIAL
Basophils Absolute: 0 10*3/uL (ref 0.0–0.1)
Basophils Absolute: 0 10*3/uL (ref 0.0–0.1)
Basophils Relative: 0 % (ref 0–1)
Basophils Relative: 0 % (ref 0–1)
Basophils Relative: 1 % (ref 0–1)
Basophils Relative: 0 % (ref 0–1)
Basophils Relative: 1 % (ref 0–1)
Basophils Relative: 1 % (ref 0–1)
Eosinophils Absolute: 0.1 10*3/uL (ref 0.0–0.7)
Eosinophils Absolute: 0.2 10*3/uL (ref 0.0–0.7)
Eosinophils Absolute: 0.2 10*3/uL (ref 0.0–0.7)
Eosinophils Absolute: 0.2 10*3/uL (ref 0.0–0.7)
Eosinophils Absolute: 0.4 10*3/uL (ref 0.0–0.7)
Eosinophils Absolute: 0.1 10*3/uL (ref 0.0–0.7)
Eosinophils Absolute: 0.1 10*3/uL (ref 0.0–0.7)
Eosinophils Relative: 1 % (ref 0–5)
Eosinophils Relative: 2 % (ref 0–5)
Eosinophils Relative: 6 % — ABNORMAL HIGH (ref 0–5)
Eosinophils Relative: 1 % (ref 0–5)
Lymphocytes Relative: 22 % (ref 12–46)
Lymphocytes Relative: 24 % (ref 12–46)
Lymphocytes Relative: 26 % (ref 12–46)
Lymphs Abs: 2.3 10*3/uL (ref 0.7–4.0)
Lymphs Abs: 2.4 10*3/uL (ref 0.7–4.0)
Lymphs Abs: 2.4 10*3/uL (ref 0.7–4.0)
Lymphs Abs: 2.5 10*3/uL (ref 0.7–4.0)
Lymphs Abs: 2.8 10*3/uL (ref 0.7–4.0)
Lymphs Abs: 1.8 10*3/uL (ref 0.7–4.0)
Monocytes Absolute: 1.2 10*3/uL — ABNORMAL HIGH (ref 0.1–1.0)
Monocytes Absolute: 1.3 10*3/uL — ABNORMAL HIGH (ref 0.1–1.0)
Monocytes Relative: 10 % (ref 3–12)
Monocytes Relative: 15 % — ABNORMAL HIGH (ref 3–12)
Monocytes Relative: 16 % — ABNORMAL HIGH (ref 3–12)
Monocytes Relative: 16 % — ABNORMAL HIGH (ref 3–12)
Monocytes Relative: 16 % — ABNORMAL HIGH (ref 3–12)
Monocytes Relative: 17 % — ABNORMAL HIGH (ref 3–12)
Monocytes Relative: 17 % — ABNORMAL HIGH (ref 3–12)
Monocytes Relative: 10 % (ref 3–12)
Monocytes Relative: 14 % — ABNORMAL HIGH (ref 3–12)
Neutro Abs: 4.9 10*3/uL (ref 1.7–7.7)
Neutro Abs: 6.6 10*3/uL (ref 1.7–7.7)
Neutro Abs: 6.9 10*3/uL (ref 1.7–7.7)
Neutrophils Relative %: 54 % (ref 43–77)
Neutrophils Relative %: 55 % (ref 43–77)
Neutrophils Relative %: 58 % (ref 43–77)
Neutrophils Relative %: 58 % (ref 43–77)
Neutrophils Relative %: 61 % (ref 43–77)
Neutrophils Relative %: 64 % (ref 43–77)
Neutrophils Relative %: 73 % (ref 43–77)

## 2010-09-27 LAB — CBC
HCT: 41.2 % (ref 36.0–46.0)
HCT: 43.1 % (ref 36.0–46.0)
HCT: 43.3 % (ref 36.0–46.0)
HCT: 43.7 % (ref 36.0–46.0)
HCT: 44.3 % (ref 36.0–46.0)
HCT: 47.6 % — ABNORMAL HIGH (ref 36.0–46.0)
Hemoglobin: 10.8 g/dL — ABNORMAL LOW (ref 12.0–15.0)
Hemoglobin: 14.1 g/dL (ref 12.0–15.0)
Hemoglobin: 14.2 g/dL (ref 12.0–15.0)
Hemoglobin: 14.7 g/dL (ref 12.0–15.0)
Hemoglobin: 15.4 g/dL — ABNORMAL HIGH (ref 12.0–15.0)
MCH: 34.1 pg — ABNORMAL HIGH (ref 26.0–34.0)
MCH: 34.4 pg — ABNORMAL HIGH (ref 26.0–34.0)
MCH: 33.7 pg (ref 26.0–34.0)
MCH: 33.8 pg (ref 26.0–34.0)
MCHC: 31.5 g/dL (ref 30.0–36.0)
MCHC: 32.5 g/dL (ref 30.0–36.0)
MCHC: 33.2 g/dL (ref 30.0–36.0)
MCHC: 33.3 g/dL (ref 30.0–36.0)
MCHC: 32.5 g/dL (ref 30.0–36.0)
MCHC: 32.5 g/dL (ref 30.0–36.0)
MCV: 103.2 fL — ABNORMAL HIGH (ref 78.0–100.0)
Platelets: 214 10*3/uL (ref 150–400)
Platelets: 228 10*3/uL (ref 150–400)
Platelets: 240 10*3/uL (ref 150–400)
RBC: 3.11 MIL/uL — ABNORMAL LOW (ref 3.87–5.11)
RBC: 4.12 MIL/uL (ref 3.87–5.11)
RBC: 4.14 MIL/uL (ref 3.87–5.11)
RBC: 4.29 MIL/uL (ref 3.87–5.11)
RBC: 4.17 MIL/uL (ref 3.87–5.11)
RBC: 4.54 MIL/uL (ref 3.87–5.11)
RDW: 14.4 % (ref 11.5–15.5)
RDW: 14.5 % (ref 11.5–15.5)
RDW: 14.6 % (ref 11.5–15.5)
RDW: 14.8 % (ref 11.5–15.5)
RDW: 14.8 % (ref 11.5–15.5)
WBC: 12.9 10*3/uL — ABNORMAL HIGH (ref 4.0–10.5)
WBC: 7.9 10*3/uL (ref 4.0–10.5)
WBC: 8.1 10*3/uL (ref 4.0–10.5)
WBC: 9.9 10*3/uL (ref 4.0–10.5)
WBC: 8.5 10*3/uL (ref 4.0–10.5)

## 2010-09-27 LAB — URINALYSIS, ROUTINE W REFLEX MICROSCOPIC
Bilirubin Urine: NEGATIVE
Hgb urine dipstick: NEGATIVE
Specific Gravity, Urine: 1.025 (ref 1.005–1.030)
pH: 5 (ref 5.0–8.0)

## 2010-09-27 LAB — COMPREHENSIVE METABOLIC PANEL
ALT: 12 U/L (ref 0–35)
ALT: 17 U/L (ref 0–35)
ALT: 19 U/L (ref 0–35)
ALT: 23 U/L (ref 0–35)
ALT: 25 U/L (ref 0–35)
AST: 17 U/L (ref 0–37)
AST: 28 U/L (ref 0–37)
AST: 28 U/L (ref 0–37)
AST: 32 U/L (ref 0–37)
AST: 47 U/L — ABNORMAL HIGH (ref 0–37)
Albumin: 2.9 g/dL — ABNORMAL LOW (ref 3.5–5.2)
Albumin: 2.9 g/dL — ABNORMAL LOW (ref 3.5–5.2)
Alkaline Phosphatase: 70 U/L (ref 39–117)
Alkaline Phosphatase: 77 U/L (ref 39–117)
Alkaline Phosphatase: 86 U/L (ref 39–117)
Alkaline Phosphatase: 91 U/L (ref 39–117)
Alkaline Phosphatase: 88 U/L (ref 39–117)
BUN: 2 mg/dL — ABNORMAL LOW (ref 6–23)
BUN: 7 mg/dL (ref 6–23)
BUN: 9 mg/dL (ref 6–23)
CO2: 28 mEq/L (ref 19–32)
CO2: 31 mEq/L (ref 19–32)
CO2: 32 mEq/L (ref 19–32)
CO2: 33 mEq/L — ABNORMAL HIGH (ref 19–32)
Calcium: 8.1 mg/dL — ABNORMAL LOW (ref 8.4–10.5)
Calcium: 8.2 mg/dL — ABNORMAL LOW (ref 8.4–10.5)
Calcium: 8.4 mg/dL (ref 8.4–10.5)
Calcium: 8.6 mg/dL (ref 8.4–10.5)
Calcium: 8.7 mg/dL (ref 8.4–10.5)
Chloride: 102 mEq/L (ref 96–112)
Chloride: 106 mEq/L (ref 96–112)
Creatinine, Ser: 0.73 mg/dL (ref 0.4–1.2)
Creatinine, Ser: 0.76 mg/dL (ref 0.4–1.2)
Creatinine, Ser: 1 mg/dL (ref 0.4–1.2)
GFR calc Af Amer: >60 mL/min (ref >60)
GFR calc Af Amer: >60 mL/min (ref >60)
GFR calc Af Amer: >60 mL/min (ref >60)
GFR calc Af Amer: >60 mL/min (ref >60)
GFR calc non Af Amer: 57 mL/min — ABNORMAL LOW (ref >60)
GFR calc non Af Amer: >60 mL/min (ref >60)
GFR calc non Af Amer: >60 mL/min (ref >60)
GFR calc non Af Amer: >60 mL/min (ref >60)
GFR calc non Af Amer: >60 mL/min (ref >60)
Glucose, Bld: 106 mg/dL — ABNORMAL HIGH (ref 70–99)
Glucose, Bld: 128 mg/dL — ABNORMAL HIGH (ref 70–99)
Glucose, Bld: 128 mg/dL — ABNORMAL HIGH (ref 70–99)
Glucose, Bld: 90 mg/dL (ref 70–99)
Glucose, Bld: 98 mg/dL (ref 70–99)
Potassium: 3.3 mEq/L — ABNORMAL LOW (ref 3.5–5.1)
Potassium: 3.9 mEq/L (ref 3.5–5.1)
Potassium: 3.9 mEq/L (ref 3.5–5.1)
Potassium: 5.3 mEq/L — ABNORMAL HIGH (ref 3.5–5.1)
Potassium: 3.6 mEq/L (ref 3.5–5.1)
Sodium: 136 mEq/L (ref 135–145)
Sodium: 138 mEq/L (ref 135–145)
Sodium: 139 mEq/L (ref 135–145)
Sodium: 139 mEq/L (ref 135–145)
Sodium: 140 mEq/L (ref 135–145)
Sodium: 142 mEq/L (ref 135–145)
Total Bilirubin: 1.9 mg/dL — ABNORMAL HIGH (ref 0.3–1.2)
Total Bilirubin: 1.7 mg/dL — ABNORMAL HIGH (ref 0.3–1.2)
Total Protein: 4.9 g/dL — ABNORMAL LOW (ref 6.0–8.3)
Total Protein: 4.9 g/dL — ABNORMAL LOW (ref 6.0–8.3)
Total Protein: 5.4 g/dL — ABNORMAL LOW (ref 6.0–8.3)
Total Protein: 5.5 g/dL — ABNORMAL LOW (ref 6.0–8.3)
Total Protein: 5.1 g/dL — ABNORMAL LOW (ref 6.0–8.3)

## 2010-09-27 LAB — HEPATIC FUNCTION PANEL
AST: 35 U/L (ref 0–37)
Bilirubin, Direct: 0.3 mg/dL (ref 0.0–0.3)
Total Bilirubin: 1 mg/dL (ref 0.3–1.2)

## 2010-09-27 LAB — BASIC METABOLIC PANEL
CO2: 28 mEq/L (ref 19–32)
CO2: 28 mEq/L (ref 19–32)
Calcium: 8.6 mg/dL (ref 8.4–10.5)
Creatinine, Ser: 1.08 mg/dL (ref 0.4–1.2)
GFR calc Af Amer: 59 mL/min — ABNORMAL LOW (ref >60)
Glucose, Bld: 111 mg/dL — ABNORMAL HIGH (ref 70–99)
Potassium: 4 mEq/L (ref 3.5–5.1)
Sodium: 138 mEq/L (ref 135–145)

## 2010-09-27 LAB — MAGNESIUM
Magnesium: 1.2 mg/dL — ABNORMAL LOW (ref 1.5–2.5)
Magnesium: 1.3 mg/dL — ABNORMAL LOW (ref 1.5–2.5)
Magnesium: 1.6 mg/dL (ref 1.5–2.5)
Magnesium: 1.6 mg/dL (ref 1.5–2.5)
Magnesium: 1.9 mg/dL (ref 1.5–2.5)
Magnesium: 2.2 mg/dL (ref 1.5–2.5)

## 2010-09-27 LAB — PHOSPHORUS
Phosphorus: 2 mg/dL — ABNORMAL LOW (ref 2.3–4.6)
Phosphorus: 2.1 mg/dL — ABNORMAL LOW (ref 2.3–4.6)
Phosphorus: 2.1 mg/dL — ABNORMAL LOW (ref 2.3–4.6)
Phosphorus: 2.3 mg/dL (ref 2.3–4.6)
Phosphorus: 2.4 mg/dL (ref 2.3–4.6)
Phosphorus: 3.3 mg/dL (ref 2.3–4.6)

## 2010-09-30 LAB — DIFFERENTIAL
Basophils Absolute: 0 10*3/uL (ref 0.0–0.1)
Basophils Relative: 0 % (ref 0–1)
Eosinophils Absolute: 0.4 10*3/uL (ref 0.0–0.7)
Eosinophils Relative: 5 % (ref 0–5)
Monocytes Absolute: 0.8 10*3/uL (ref 0.1–1.0)
Monocytes Relative: 9 % (ref 3–12)

## 2010-09-30 LAB — BASIC METABOLIC PANEL
BUN: 18 mg/dL (ref 6–23)
Chloride: 105 mEq/L (ref 96–112)
GFR calc non Af Amer: 47 mL/min — ABNORMAL LOW (ref >60)
Potassium: 4.5 mEq/L (ref 3.5–5.1)
Sodium: 138 mEq/L (ref 135–145)

## 2010-09-30 LAB — URINALYSIS, ROUTINE W REFLEX MICROSCOPIC
Glucose, UA: NEGATIVE mg/dL
Hgb urine dipstick: NEGATIVE
Protein, ur: NEGATIVE mg/dL
pH: 5 (ref 5.0–8.0)

## 2010-09-30 LAB — CBC
HCT: 51.4 % — ABNORMAL HIGH (ref 36.0–46.0)
Hemoglobin: 16.8 g/dL — ABNORMAL HIGH (ref 12.0–15.0)
MCV: 102.2 fL — ABNORMAL HIGH (ref 78.0–100.0)
Platelets: 300 10*3/uL (ref 150–400)
RBC: 5.03 MIL/uL (ref 3.87–5.11)
WBC: 9.1 10*3/uL (ref 4.0–10.5)

## 2010-09-30 LAB — URINE CULTURE: Colony Count: NO GROWTH

## 2010-09-30 LAB — URINE MICROSCOPIC-ADD ON

## 2010-10-22 LAB — CULTURE, BAL-QUANTITATIVE W GRAM STAIN: Colony Count: 50000

## 2010-10-22 LAB — FUNGUS CULTURE W SMEAR: Fungal Smear: NONE SEEN

## 2010-10-22 LAB — AFB CULTURE WITH SMEAR (NOT AT ARMC)

## 2010-11-27 NOTE — Op Note (Signed)
[name], [name]                ACCOUNT NO.:  [id number]   MEDICAL RECORD NO.:  [id number]          PATIENT TYPE:  AMB   LOCATION:  DAY                           FACILITY:  [facility]   PHYSICIAN:  Edward L. Juanetta Gosling, M.D.DATE OF BIRTH:  Jun 22, 1929   DATE OF PROCEDURE:  DATE OF DISCHARGE:  01/11/2009                               OPERATIVE REPORT   REASON FOR PROCEDURE:  Ms. Burling has an abnormal chest x-ray and  history of tuberculosis.  She is undergoing bronchoscopy to get  specimens to try to confirm the diagnosis of TB.   PREOPERATIVE DIAGNOSES:  1. Abnormal chest x-ray.  2. History of tuberculosis.   POSTOPERATIVE DIAGNOSES:  1. Abnormal chest x-ray.  2. History of tuberculosis.   PROCEDURE:  Fiberoptic bronchoscopy with washings.   SURGEON:  Edward L. Juanetta Gosling, MD   ANESTHESIA:  Conscious sedation.   BODY OF THE REPORT:  After satisfactory local anesthesia and 7.5 mg of  Versed intravenously, the bronchoscope was introduced through the right  nares.  The upper airway was inspected and found to be within normal  limits.  The vocal cords were identified and anesthetized with Xylocaine  and the bronchoscope was introduced into the trachea.  The tracheal  mucosa was erythematous suggesting some irritation.  She had some  bronchial pitting suggesting chronic bronchitis.  In both upper lobes,  she had some bronchiectatic changes suggesting old tuberculosis and in  the left upper lobe she had some impacted inspissated secretions, which  were suctioned.  Washings were made and then the bronchoscope was  withdrawn.  The patient tolerated the procedure well and was taken to  the recovery room in good condition.      Edward L. Juanetta Gosling, M.D.  Electronically Signed    ELH/MEDQ  D:  01/11/2009  T:  01/12/2009  Job:  [id number]   cc:   Lorin Picket A. Gerda Diss, MD  Fax: [phone]

## 2010-11-27 NOTE — Op Note (Signed)
NAMEKYARAH, ENAMORADO NO.:  [id number]   MEDICAL RECORD NO.:  [id number]          PATIENT TYPE:  INP   LOCATION:  3038                         FACILITY:  [facility]   PHYSICIAN:  Tia Alert, MD     DATE OF BIRTH:  1928/08/02   DATE OF PROCEDURE:  11/19/2007  DATE OF DISCHARGE:                               OPERATIVE REPORT   PREOPERATIVE DIAGNOSIS:  Spinal stenosis L4-L5 on the right with right  L5 radiculopathy.   POSTOPERATIVE DIAGNOSES:  Spinal stenosis L4-L5 on the right with right  L5 radiculopathy.   PROCEDURE:  1. Lumbar hemilaminectomy.  2. Medial facetectomy and foraminotomy at L4-L5 on the right side for      decompression of the right L4 and L5 nerve roots followed by      microdiskectomy at L4-L5 on the right utilizing microscopic      dissection.   SURGEON:  Tia Alert, MD   ASSISTANT:  Reinaldo Meeker, MD   ANESTHESIA:  General endotracheal.   COMPLICATIONS:  None apparent.   INDICATIONS FOR THE PROCEDURE:  Ms. Baxendale is a 77 year old female who  was referred with severe right leg pain that seem to fall in L5  distribution, but also hurt in the knee and anterolateral thigh.  She  had an MRI which showed lateral recess stenosis at L4-L5 on the right  side.  I recommended a lumbar hemilaminectomy on the right for  decompression of the right L4 and L5 nerve root.  She understood the  risks, benefits, expected outcome, and wished to proceed.   DESCRIPTION OF THE PROCEDURE:  The patient was taken operating room and  after induction of adequate generalized endotracheal anesthesia, she was  rolled into the prone position on the Wilson frame.  All pressure points  were padded.  Her lumbar region was prepped with DuraPrep and draped in  the usual sterile fashion.  A 5 mL of local anesthesia was injected and  a small dorsal midline incision was made through the old incision and  carried down to the lumbosacral fascia.  The fascia was opened on  the  patient's right side and taken down in a subperiosteal fashion to expose  L4-L5 on the right side.  Intraoperative x-ray confirmed my level and  then I used the high-speed drill and the Kerrison punches to perform a  hemilaminectomy, medial facetectomy, and foraminotomy at L4-L5 on the  right.  The underlying yellow ligament was opened and removed in a  piecemeal fashion to expose the underlying dura and L5 nerve root.  I  was able to march along the L5 nerve root along the medial pedicle wall  distally into the foramen.  I then marched upward, undercut the lateral  recess, identified the L4 nerve root, and then palpated with coronary  dilator to assure it was adequately decompressed.  The midline and the  lateral recess were well decompressed by the decompression.  I then  retracted the nerve root medially and noticed a subannular disk bulge,  but then I noticed a disk herniation that was sitting  under the L5 nerve  root on the right side.  I was able to sweep this up with a nerve hook  and remove it.  I was then able to incise the disk space utilizing  microscopic dissection under the operating microscope and perform a  thorough intradiskal diskectomy.  The nerve then hung freely over the  disk space and was well decompressed, palpated with coronary dilator  into the midline and into the foramen to assure adequate decompression.  I then irrigated with saline solution containing bacitracin, dried all  bleeding points with bipolar cautery and Gelfoam.  I then lined the dura  with DuraGen to prevent epidural fibrosis and then closed the fascia  with 0 Vicryl, closed the subcuticular tissues with 2-0 and 3-0 Vicryls  and closed skin with benzoin and Steri-Strips.  The drapes were removed.  A sterile dressing was applied.  The patient awakened from general  anesthesia and transferred to recovery room in stable condition.  At the  end of procedure, all sponge, needle, and instrument  counts were  correct.      Tia Alert, MD  Electronically Signed     DSJ/MEDQ  D:  11/19/2007  T:  11/20/2007  Job:  [id number]

## 2010-11-27 NOTE — Op Note (Signed)
[name], [name] NO.:  [id number]   MEDICAL RECORD NO.:  [id number]          PATIENT TYPE:  INP   LOCATION:  3025                         FACILITY:  [facility]   PHYSICIAN:  Tia Alert, MD     DATE OF BIRTH:  [birth year]   DATE OF PROCEDURE:  01/21/2007  DATE OF DISCHARGE:  01/22/2007                               OPERATIVE REPORT   PREOPERATIVE DIAGNOSIS:  Lumbar spinal stenosis L4-5 with recurrent  stenosis L5-S1 with spondylosis and left leg pain.   POSTOPERATIVE DIAGNOSIS:  Lumbar spinal stenosis L4-5 with recurrent  stenosis L5-S1 with spondylosis and left leg pain.   PROCEDURE:  Redo decompressive lumbar hemilaminectomy, medial  facetectomy, foraminotomy L5-S1 with decompressive laminectomy and  medial facetectomy, foraminotomy L4-5 on the left with sublaminar  decompression at L4-5 for central canal decompression utilizing  microscopic dissection.   SURGEON:  Dr. Marikay Alar.   ASSISTANT:  Donalee Citrin, M.D.   ANESTHESIA:  General endotracheal.   COMPLICATIONS:  None apparent.   INDICATIONS FOR PROCEDURE:  [name] is a very pleasant 77 year old  female who presented with severe left leg pain.  She had a previous  laminectomy at L5-S1 on the left side and a remote history.  She  complained of pain consistent with either L5 or an S1 distribution.  She  had tried medical management for some time without significant relief.  I recommended decompressive laminectomy L4-5, L5-S1 on the left side  with possible diskectomy.  She understood the risks, benefits, expected  outcome and wished to proceed.   DESCRIPTION OF PROCEDURE:  The patient was taken to operating room after  induction of adequate generalized endotracheal anesthesia, she was  rolled to prone position on the Wilson frame.  All pressure points  padded and the lumbar region was prepped DuraPrep and draped in the  usual sterile fashion.  5 mL local anesthesia was injected and her old  incision was reopened and carried down to the lumbosacral fascia.  The  fascia was opened on the left side and taken down in subperiosteal  fashion to expose L4-5, L5-S1 on the left.  Intraoperative x-ray  confirmed my level at both levels and then I used the Kerrison punch to  perform hemilaminectomies, medial facetectomy and foraminotomies at L4-  5, L5-S1.  I completed the laminectomy at L5-S1, identified the scar  tissue and detethered this from the medial facette, decompressed the  lateral recess at L5-S1 removing the remaining yellow ligament  identified the S1 nerve root retracted it medially to detether it from  the scar tissue.  I then at L4-5 decompressed the lateral recess by  removing yellow ligament to identify the underlying dura and L5 nerve  root.  I followed the L5 nerve root down to the pedicle, dissected out  to the medial pedicle wall and then completed the laminectomy at L4-5 by  following the nerve root out into the foramen.  I then performed a  sublaminar decompression with the Kerrison punch to decompress the  central canal.  I then brought in the operating microscope.  I  retracted  the dura medially at L4-5, L5-S1, identified the disk spaces, coagulated  the epidural venous vasculature.  I found small disk pulses at each  level but did not feel that those disks needed to be incised and  diskectomies completed.  Therefore I inspected my nerve roots once  again.  I palpated into the foramen along the nerve roots with coronary  dilators to make sure I had adequate decompression of L5 and S1 nerve  roots.  I felt like excellent decompression.  I irrigated with saline  solution containing bacitracin, dried all bleeding points, lined the  dura with Gelfoam and then closed the fascia with #1 Vicryl, closed the  subcutaneous subcuticular tissues with 2-0 and 3-0 Vicryl and closed  skin with Benzoin Steri-Strips.  The drapes removed.  Sterile dressing  was applied.  The  patient was awakened from general anesthesia and  transferred recovery room in stable condition.  At the end of the  procedure all sponge, needle and instrument counts were correct.      Tia Alert, MD  Electronically Signed     DSJ/MEDQ  D:  01/21/2007  T:  01/22/2007  Job:  [id number]

## 2010-11-27 NOTE — H&P (Signed)
NAMESEVYN, MARKHAM               ACCOUNT NO.:  [id number]   MEDICAL RECORD NO.:  [id number]          PATIENT TYPE:  AMB   LOCATION:  DAY                           FACILITY:  [facility]   PHYSICIAN:  Edward L. Juanetta Gosling, M.D.DATE OF BIRTH:  09-08-1928   DATE OF ADMISSION:  DATE OF DISCHARGE:  LH                              HISTORY & PHYSICAL   Set up for bronchoscopy on the 30th.   REASON FOR BRONCHOSCOPY:  Abnormal chest x-ray and history of  tuberculosis.   Ms. Cadet is a 77 year old who has had weight loss, cough, chest x-ray,  and CT scan.  The chest x-ray and CT are consistent with old  tuberculosis and perhaps some active disease as well.  She has lost  about 15-18 pounds, but she has not had any fever.  She has not had any  night sweats.  She has not had any sputum.   Her past medical history is positive for TB 25 years ago.  She has had a  gastrectomy in the past as well.   SOCIAL HISTORY:  She is a nonsmoker.  She does not use any alcohol.  She  is retired from [employer].   Her family history is positive for tuberculosis in her father.   Her review of systems except as mentioned is negative.   Her physical examination shows a thin female who is in no acute  distress.  Her chest is actually fairly clear.  Her heart is regular.  Her abdomen soft.  Extremities showed no edema.  Central nervous system  examination is grossly intact.  Her HEENT is unremarkable.   ASSESSMENT:  Abnormal chest x-ray and a history of tuberculosis.  We are  going to have her undergo a bronchoscopy with potential biopsy depending  on what is found on the bronchoscopy.  She has not been able to obtain  sputum from the health department; so, she is going to obtain sputums as  well.      Edward L. Juanetta Gosling, M.D.  Electronically Signed     ELH/MEDQ  D:  01/03/2009  T:  01/04/2009  Job:  [id number]   cc:   Lorin Picket A. Gerda Diss, MD  Fax: [phone]

## 2010-11-30 NOTE — Group Therapy Note (Signed)
[name], [name]                ACCOUNT NO.:  [id number]   MEDICAL RECORD NO.:  [id number]          PATIENT TYPE:  INP   LOCATION:  A213                          FACILITY:  [facility]   PHYSICIAN:  Margaretmary Dys, M.D.DATE OF BIRTH:  November 02, 1928   DATE OF PROCEDURE:  10/06/2005  DATE OF DISCHARGE:                                   PROGRESS NOTE   SUBJECTIVE:  Patient feels slightly better, although she still has some left-  sided abdominal pain.  She rates it about a 6 to 7/10.  Appetite is  improved.  She overall feels well, but she does not feel she is back to her  baseline.  She has had no fever or chills.  No nausea or vomiting.   OBJECTIVE:  Conscious, alert, comfortable, not in acute distress.  Pleasant  lady, well oriented to time, place and person.  VITAL SIGNS:  Blood pressure 121/66, pulse 82, respirations 22, temperature  98.3, blood sugar range between 46 to 229.  Patient inadvertently received  NovoLog which was discontinued yesterday, causing her to have hypoglycemic,  this has been reported in the incident report.  HEENT:  Normocephalic and atraumatic.  Oral mucosa moist.  NECK:  Supple, no JVD, no lymphadenopathy.  LUNGS:  Clear clinically.  CARDIOVASCULAR:  S1 and S2 regular.  No S3, S4, gallops or rubs.  ABDOMEN:  Soft.  Patient did have some mild tenderness in the epigastrium  and the left upper quadrant area.  She does have a right-sided colostomy  which is draining fairly well.  Bowel sounds were positive.  EXTREMITIES:  No edema.  CNS:  Grossly intact.   LABORATORY DATA:  White blood cell count was slightly up today at 19.9,  hemoglobin 13.7, hematocrit 41.3, platelet count 460; neutrophils of 91%.   ASSESSMENT/PLAN:  1.  Acute abdominal pain, likely secondary to pancreatitis.  I did not have      an amylase and lipase level today.  Will repeat in the morning.  The      trend has been improving over time.  2.  She does have a leukocytosis today, which was  slightly worse from two      days ago, from 17,000 to 19,000.  I would repeat in the morning.  She      does not have any evidence of infection at this time and will continue      to monitor her.  We may sent a urinalysis or a septic work-up if she      spikes a fever.   DISPOSITION:  I will continue current management.  Repeat blood counts in  the morning.  Monitor for any evidence of fever.  Patient will be seen by  Dr. Gerda Diss in the morning.      Margaretmary Dys, M.D.  Electronically Signed     AM/MEDQ  D:  10/06/2005  T:  10/06/2005  Job:  [id number]

## 2010-11-30 NOTE — Discharge Summary (Signed)
[name], [name] NO.:  [id number]   MEDICAL RECORD NO.:  [id number]          PATIENT TYPE:  INP   LOCATION:  A309                          FACILITY:  [facility]   PHYSICIAN:  Tilford Pillar, MD      DATE OF BIRTH:  1929-02-02   DATE OF ADMISSION:  06/29/2008  DATE OF DISCHARGE:  12/21/2009LH                               DISCHARGE SUMMARY   ADMISSION DIAGNOSIS:  Partial small bowel obstruction.   DISCHARGE DIAGNOSES:  1. Resolution of partial small-bowel obstruction.  2. Chronic obstructive pulmonary disease.  3. Hypertension.  4. Depression.   DISPOSITION:  Home.   ADMITTING SURGEON:  Tilford Pillar, MD   PROCEDURES:  None.   BRIEF HISTORY AND PHYSICAL:  Please see the admission history and  physical for complete history and physical.  The patient is a 77-year-  old female with a history of previous colectomy and gastric resection,  who has presented with increasing nausea and vomiting and abdominal  discomfort.  On evaluation in the emergency department, it was  suspicious if she did have a bowel obstruction as she has had some bowel  function.  This was noted to be a partial bowel obstruction.  She was  admitted for continued management and treatment.   HOSPITAL COURSE:  The patient was admitted on June 29, 2008.  She  was maintained on bowel rest, IV fluid hydration, and was continued to  be monitored closely.  She increased her activity.  She did begin to  increase the amount of flatus and bowel and stooling.  Therefore, she  was started back on a diet.  She was advanced to a low-residual diet,  and on July 04, 2008, the patient was tolerating a regular diet, was  ambulatory with no admitting symptomatology.  The patient was discharged  on that date.   DISCHARGE INSTRUCTIONS:  The patient was instructed to continue a low-  sodium, heart-healthy diet.  She is to increase her activity as  tolerated.  She may walk up steps.  She may shower and  bathe.  She is  return to my office as needed.  She is to return and see Dr. Gerda Diss in 3-  4 weeks or as previously scheduled.   DISCHARGE MEDICATIONS:  The patient is to resume all previously  prescribed home medications including Cenestin, Effexor XR, potassium  chloride supplementation, metoprolol, Prevacid, Zyrtec, Fosamax, Valium,  ipratropium, calcium, domperidone, tramadol.       Tilford Pillar, MD  Electronically Signed     BZ/MEDQ  D:  08/11/2008  T:  08/12/2008  Job:  [id number]

## 2010-11-30 NOTE — Op Note (Signed)
[name], [name]                ACCOUNT NO.:  [id number]   MEDICAL RECORD NO.:  [id number]          PATIENT TYPE:  AMB   LOCATION:  DAY                           FACILITY:  [facility]   PHYSICIAN:  Lionel December, M.D.    DATE OF BIRTH:  [birth year]   DATE OF PROCEDURE:  08/22/2005  DATE OF DISCHARGE:                                 OPERATIVE REPORT   PROCEDURE:  Esophagogastroduodenoscopy.   INDICATIONS:  [name] is a 77 year old Caucasian female with complicated GI  history which is summarized in consultation note who was found to have heme-  positive stools. She also has early satiety and progressive weight loss  despite good appetite. Her B12 level was normal. She also gives history of  intermittent epigastric pain. She remains on a PPI. She is also on Fosamax  once a day. Procedure and risks were reviewed with the patient. She is also  on weekly Fosamax for osteoporosis.   Procedure and risks were reviewed with the patient, and informed consent was  obtained.   PREMEDICATION:  Cetacaine spray for pharyngeal topical anesthesia, Demerol  50 mg IV, Versed 6 mg IV.   FINDINGS:  Procedure performed in endoscopy suite. The patient's vital signs  and O2 saturation were monitored during the procedure and remained stable.  The patient was placed in left lateral position. Olympus videoscope was  passed via oropharynx without any difficulty into esophagus.   Esophagus. Mucosa of the esophagus normal. Distally, at and just above the  GE junction was saccular dilation on the right side, possibly indicative of  a large-mouthed diverticulum. However, there was not a definite or equivocal  neck or mouth to this diverticulum. GE junction was at 35 cm. GE junction  was at 34 cm from the incisors.   Stomach. Moderate sized gastric pouch with scant amount of food debris. This  was easily pushed into the small bowel passed the anastomosis. Anastomosis  was wide open. Gastric mucosa was unremarkable.  Fundus and cardia were also  normal.   Scope was passed into loop of jejunum where mucosa and folds were normal.  Bile was noted at about 12 cm distal to gastrojejunostomy; however, I did  not see Roux-en-Y anastomosis at this level. Endoscope was withdrawn. The  patient tolerated the procedure well.   FINAL DIAGNOSIS:  Localized saccular dilation to the distal esophagus which  may be indicative of a diverticulum.   No evidence of peptic ulcer disease or anastomotic ulcer.   Proximal segment of small bowel was normal. Note, the patient is status post  a Roux-en-Y gastrostomy.   RECOMMENDATIONS:  The patient advised to eat at least six meals a day.   We will proceed with 24-hour fecal collection for volume and fecal fat  analysis.      Lionel December, M.D.  Electronically Signed     NR/MEDQ  D:  08/22/2005  T:  08/22/2005  Job:  [id number]   cc:   Lorin Picket A. Gerda Diss, MD  Fax: [phone]

## 2010-11-30 NOTE — Group Therapy Note (Signed)
[name], CRIBB                ACCOUNT NO.:  [id number]   MEDICAL RECORD NO.:  [id number]          PATIENT TYPE:  INP   LOCATION:  A213                          FACILITY:  [facility]   PHYSICIAN:  Scott A. Gerda Diss, MD    DATE OF BIRTH:  07-Nov-1928   DATE OF PROCEDURE:  09/28/2005  DATE OF DISCHARGE:                                   PROGRESS NOTE   SUBJECTIVE:  Her white count went up to 23,000 but she is not running a  fever.  She states her pain is under decent control.  Her creatinine has  improved to 1.3 and her potassium is at 3.5.  Her lipase and amylase are  still elevated but are better than what they were with an amylase of 221 and  lipase of 410.  She has a little bit of a mild headache, is tolerating  liquids well and denies any fever or chills.   OBJECTIVE:  Her examination today is benign, lungs clear, abdomen is soft,  subjective discomfort in the mid abdomen not severe.   ASSESSMENT AND PLAN:  We will go forward with the current therapy, recheck  blood count again tomorrow and then gear toward doing a CT scan on Monday if  her creatinine comes down a little bit further.  Warning signs discussed  with the patient thoroughly.      Scott A. Gerda Diss, MD  Electronically Signed     SAL/MEDQ  D:  09/28/2005  T:  09/29/2005  Job:  [id number]

## 2010-11-30 NOTE — Discharge Summary (Signed)
NAMESHERESA, [name]                ACCOUNT NO.:  [id number]   MEDICAL RECORD NO.:  [id number]          PATIENT TYPE:  INP   LOCATION:  A213                          FACILITY:  [facility]   PHYSICIAN:  Scott A. Gerda Diss, MD    DATE OF BIRTH:  December 27, 1928   DATE OF ADMISSION:  09/27/2005  DATE OF DISCHARGE:  03/26/2007LH                                 DISCHARGE SUMMARY   DISCHARGE DIAGNOSES:  1.  Pancreatitis.  2.  Hypocalcemia.  3.  Hypomagnesemia.  4.  Hypophosphatemia.  5.  Left sided rib discomfort.  6.  Sinus tachycardia.   HOSPITAL COURSE:  This patient was admitted in with severe pancreatitis,  abdominal pain, vomiting, was treated.  White count went up, calcium went  down, glucose went up.  The patient was felt at significant risk of some  serious complications, but the patient was able to come through all these  okay and gradually improved.  Amylase/lipase came down, and in addition to  this, her calcemia/magnesemia was corrected as well as phosphatemia.  The  patient gradually improved to the point where she could go home on March 26.  She did run into a couple of brief complications of mild tachycardia felt  secondary due to her malnutrition as well as core physical conditioning.  In  addition to this, also had a low glucose spell that was secondary to a  sugar.  GI felt like patient could go home as soon as she was feeling  improved. On March 26, her abdomen was nontender.  She had left rib pain  which was taken care of by medication.  She also had no particular other  troubles.  Examination at time of discharge was stable.  Lab work stable.  She was felt stable to be discharge with follow up in one week.  Off of  insulin and recheck in one week.      Scott A. Gerda Diss, MD  Electronically Signed     SAL/MEDQ  D:  10/07/2005  T:  10/08/2005  Job:  [id number]

## 2010-11-30 NOTE — H&P (Signed)
NAMEJANNINE, [name]                ACCOUNT NO.:  [id number]   MEDICAL RECORD NO.:  [id number]          PATIENT TYPE:  INP   LOCATION:  A330                          FACILITY:  [facility]   PHYSICIAN:  Kingsley Callander. Ouida Sills, MD       DATE OF BIRTH:  05-02-1929   DATE OF ADMISSION:  09/26/2005  DATE OF DISCHARGE:  LH                                HISTORY & PHYSICAL   CHIEF COMPLAINT:  Abdominal pain and vomiting.   HISTORY OF PRESENT ILLNESS:  This patient is a 77 year old white female who  presented to the emergency room with a one-day history of upper abdominal  pain and vomiting. She denied hematemesis. She had been in her usual state  of health one day earlier. She developed repeated episodes of vomiting. She  was unable to eat or drink. She had drive heaves. There is no hematemesis.  She has a colostomy. She has had no colostomy bleeding or melena. She has a  history of peptic ulcer disease and has had a partial gastrectomy. She takes  Prevacid daily. She has also had a partial colectomy and has a right sided  colostomy in place. On evaluation in the emergency room, she was found to  have an elevated lipase of 856. She does not drink alcohol. She has had a  cholecystectomy. She has recently taken a course of metronidazole after  having an evaluation by the gastroenterologist.   PAST MEDICAL HISTORY:  1.  Migraine headaches.  2.  Partial gastrectomy.  3.  Partial colectomy.  4.  Peptic ulcer disease.  5.  Appendectomy.  6.  Back surgery.  7.  Tonsillectomy.  8.  Cholecystectomy.  9.  Hysterectomy.   MEDICATIONS:  1.  K-Dur 20 mEq daily.  2.  Prevacid 30 mg daily.  3.  Effexor XR 225 mg daily.  4.  Atrovent inhaler.  5.  Fosamax 70 mg weekly.  6.  Valium 5 mg daily.  7.  Ambien 5 mg nightly.  8.  Phenergan 25 mg p.r.n.  9.  Lasix 40 mg p.r.n.  10. Lopressor 50 mg daily.  11. Vicodin approximately 20 per month.   ALLERGIES:  TETANUS.   SOCIAL HISTORY:  She does not smoke or  drink. She lives with her husband.  Today is their 50th anniversary.   REVIEW OF SYSTEMS:  Noncontributory.   PHYSICAL EXAMINATION:  VITAL SIGNS:  Temperature 96.6, blood pressure  131/87, pulse 89, respirations 20, oxygen saturation 99.  GENERAL:  Alert and oriented.  HEENT:  No scleral icterus. Oropharynx is mildly dry.  NECK:  No JVD or thyromegaly.  LUNGS:  Clear.  HEART:  Regular with no murmurs.  ABDOMEN:  Tender in the epigastrium. No hepatosplenomegaly or palpable mass.  She has a right sided colostomy and well healed surgical wounds.  EXTREMITIES:  Pulses are normal. No clubbing, cyanosis, or edema.  NEUROLOGICAL:  Grossly intact.   LABORATORY DATA:  White count 13.4, hemoglobin 17.4, platelets 277,000, 85  segs, 10 lymphs. Sodium 136, potassium 4.1, chloride 99, bicarb 26, glucose  207, BUN 52, creatinine  1.6, calcium 10.6, albumin 3.9, SGOT 99, SGPT 62,  alkaline phosphatase 112, bilirubin 1.3, lipase 856. Urinalysis reveals no  ketones, leukocyte esterase is negative. Acute abdominal series reveals no  acute abdominal findings. She has chronic bronchitic changes. No  granulomatous changes in the lungs.   IMPRESSION:  1.  Acute pancreatitis, possibly related to metronidazole. Metronidazole is      being stopped. She will be treated with IV fluids, IV Protonix and      p.r.n. Zofran. Will obtain an ultrasound of the abdomen in the morning.  2.  Renal insufficiency. Baseline BUN and creatinine are not known.  3.  Hyperglycemia. Glucose is 207. Will obtain Accu-Cheks and treat with      NovoLog as needed.  4.  Elevated LFTs. As above, will assess with an ultrasound.  5.  Hypercalcemia. Will recheck after IV fluids have been administered.  6.  Migraines. Continue Effexor XR.      Kingsley Callander. Ouida Sills, MD  Electronically Signed     ROF/MEDQ  D:  09/27/2005  T:  09/27/2005  Job:  [id number]   cc:   Lorin Picket A. Gerda Diss, MD  Fax: [phone]

## 2010-11-30 NOTE — Group Therapy Note (Signed)
[name], [name]                ACCOUNT NO.:  [id number]   MEDICAL RECORD NO.:  [id number]          PATIENT TYPE:  INP   LOCATION:  A330                          FACILITY:  [facility]   PHYSICIAN:  Scott A. Gerda Diss, MD    DATE OF BIRTH:  [birth year]   DATE OF PROCEDURE:  09/26/2005  DATE OF DISCHARGE:                                   PROGRESS NOTE   SUBJECTIVE:  The patient is still having some intermittent abdominal  discomforts.  Her repeat on her lab work shows that her potassium is hanging  tight, but her BUN is still up quite a bit and creatinine 1.7, so she  continues the need to have fluids.  Her liver enzymes looked improved  compared to where it was and her lipase has come down slightly to 830 and  her ultrasound did not show an obstruction, did not show any enlargement in  the common bile duct, but showed the possibility of a pancreatic head mass  versus pancreatitis.   PLAN:  I discussed the case with Dr. Jena Gauss.  The best treatment of action  currently is to continue on with IV fluids, recheck the labs in the morning  and we are geared toward doing a CT on Monday if her creatinine comes down  and she is still having problems with this.      Scott A. Gerda Diss, MD  Electronically Signed     SAL/MEDQ  D:  09/27/2005  T:  09/29/2005  Job:  [id number]

## 2010-11-30 NOTE — Consult Note (Signed)
NAMELUCAS, [name]                ACCOUNT NO.:  [id number]   MEDICAL RECORD NO.:  [id number]          PATIENT TYPE:  INP   LOCATION:  A330                          FACILITY:  [facility]   PHYSICIAN:  R. Roetta Sessions, M.D. DATE OF BIRTH:  08-Apr-1929   DATE OF CONSULTATION:  09/28/2005  DATE OF DISCHARGE:                                   CONSULTATION   REASON FOR CONSULTATION:  Pancreatitis.   HISTORY OF PRESENT ILLNESS:  Ms. [name] is a pleasant 77 year old lady  with a history of multiple abdominal surgeries who was in her usual state of  fair health until September 25, 2005 when she started having band-like upper  abdominal pain and vomiting. She presented to the emergency department. She  was found to have an elevated amylase and lipase. Ultrasound of the abdomen  demonstrated abnormalities in the foot and the head of the pancreas  consistent with inflammatory process. Bile duct measured 1 to 2 mm. There  was intrahepatic biliary dilation. The gallbladder was gone. She was  admitted to the hospital September 26, 2005 and treated with antiemetics and  analgesic therapy in the way of morphine. She has significantly improved  over the past 24 hours but is still having some abdominal pain. It is  notable that on admission that her AST and ALT were slightly elevated at 99  and 62. Yesterday, they were 45 and 39 and today 27 and 27.  Her serum  calcium was a tenth of a point above normal at 10.6 on admission, 7.1 today.  It was 8.5 yesterday. Amylase and lipase were 221 and 410 today. White count  was 13,400 on March 15; it is 23,700 today with 86% neutrophils. She has  remained afebrile and is not tachycardic. She is hungry and wants to eat.  She has been n.p.o. except ice chips.   It is notable that by description Ms. Bellisario underwent a flexible  sigmoidoscopy through her colostomy by Dr. Karilyn Cota a month ago, and he  started her on Flagyl (those records are not available). It sounds like  she  was being treated for bacterial overgrowth, and indeed she tells me that for  the past couple of weeks, her rather loose colostomy output has decreased in  volume and has firmed up.   She had elevated BUN and creatinine on admission. Those numbers are  improving with hydration. She tells her baseline weight is around 103 to 104  pounds. I do not see admission weight in the medical record. Her  triglyceride status is unknown to me. She does not consume alcohol. There is  no prior history of pancreatitis.   PAST MEDICAL HISTORY:  Chronic diarrhea. She has had a very extensive  evaluation by Dr. Karilyn Cota previously. Small-bowel biopsy was previously  negative for villous atrophy. Sigmoidoscopy, incomplete colonoscopy and  barium enema previously demonstrated only advanced diverticular disease. It  is reported that she still has her rectum and some of her distal sigmoid  colon. She has a distant history of colonic adenomas. By patient report, she  had a flexible sigmoidoscopy one  month ago by Dr. Karilyn Cota which did not  reveal any abnormalities and finally stool collection for fecal fat was  completed, and she recalls being told that she did not have excess fat in  her stool. Again, those records are not available to me at this time. She  has a complicated history of peptic ulcer disease. She is status post  Billroth-II hemigastrectomy and Roux-en-Y redo subsequently, history of  small-bowel obstruction secondary to adhesions. She has had actually  multiple laparotomies for bowel obstruction. She had a history of gangrenous  cecal obstruction requiring right hemicolectomy previously. She has also had  a left hemicolectomy. Her gallbladder and appendix had been removed  previously. She had a history of back surgery, history of incisional hernia  repair, history of bronchitis, IBS, gastroesophageal reflux disease.   She underwent EGD by Dr. Karilyn Cota on August 22, 2005. This was done for   Hemoccult-positive stool. She has chronic early satiety and apparently had  some weight loss which was concerning. B12 level was normal. EGD revealed  localized saccular dilation of the distal esophagus concerning for early  diverticular formation. No evidence of peptic ulcer or anastomosis ulcer.  Proximal segment of small bowel appeared to be normal. The patient was  status post Roux-en-Y gastrostomy. Dr. Karilyn Cota noted bile was seen in the  efferent limb approximately 12 cm distal to the surgical anastomosis.   In the old records, it was reported that she was treated with Flagyl  previously for bacterial overgrowth.   MEDICATIONS ON ADMISSION:  1.  K-Dur.  2.  Prevacid 30 mg daily.  3.  Effexor 225 mg daily.  4.  Atrovent inhaler.  5.  Fosamax 70 mg weekly.  6.  Valium 5 mg daily.  7.  Ambien 5 mg nightly.  8.  Phenergan.  9.  Lasix.  10. Lopressor.  11. Vicodin.   ALLERGIES:  TETANUS TOXOID.   SOCIAL HISTORY:  The patient lives on [address] here in [place]  with her husband. She does not consume alcohol or tobacco products. She just  recently had her 50th wedding anniversary.   REVIEW OF SYSTEMS:  Reflux symptoms well controlled. She has chronic early  satiety. No recent nausea and vomiting as outlined above. She has not had  any gross blood or melena through her colostomy.   PHYSICAL EXAMINATION:  GENERAL:  Reveals an alert, conversant, elderly  female in no acute distress resting in her hospital bed.  VITAL SIGNS:  Temperature 98.6, pulse 81, respiratory rate 10, BP 127/71.  SKIN:  Warm and dry. There is no jaundice. No continuous stigmata of chronic  liver disease.  HEENT:  No scleral icterus. Conjunctivae pink.  CHEST:  Lungs are clear to auscultation.  CARDIAC:  Regular rate and rhythm without murmur, gallop or rub.  BREASTS:  Deferred.  ABDOMEN:  She has numerous surgical scars and what appears to be a colostomy versus ileoscopy in her right mid abdomen.  Bowel sounds are present. Abdomen  is nondistended. Bowel sounds are present. She does have some periumbilical  tenderness to palpation. No appreciable mass.  EXTREMITIES:  No edema.   ADDITIONAL LABORATORY DATA:  Today March 17, sodium 141, potassium 3.5,  chloride 111, CO2 24, glucose 188, BUN 36 down from 52, creatinine 1.3 down  from 1.7. Total bilirubin 0.6, direct 0.2, indirect 0.4, alkaline  phosphatase 84, AST 27, ALT 27, albumin 2.4. Urinalysis negative.   IMPRESSION:  Ms. Tyashia Morrisette is a pleasant 77 year old lady  admitted to the  hospital with pancreatitis. She has extensive GI history as far as surgeries  are concerned as alluded to above. She has a focal area of abnormality noted  on the ultrasound. She has a tiny CBD with mild intrahepatic dilation.   Etiology of her pancreas is not well defined at this time.   I do note that she did have a mild bump in her transaminases on admission  that subsequently normalized. This brings to mind the possibility of a  transient obstruction via small calculus producing mild gallstone  pancreatitis, but as stated above, her CBD is tiny.   She was started on Flagyl for presumed bacterial overgrowth one month ago,  and indeed, this seems to have helped her bowel symptoms. It is intriguing  that she now has pancreatitis. Question has been raised as to whether or not  this could be Flagyl-induced pancreatitis. Briefly reviewed the literature,  and as of 2002, there had been seven cases reported in the literature of  Flagyl-induced pancreatitis (a number of these cases a causative  relationship was established as patients were rechallenged and got  pancreatitis once again), although Flagyl-induced pancreatitis must be  exceedingly rare.   She had a minimally elevated calcium. I doubt she has hypercalcemia-induced  pancreatitis.   Triglyceride status unknown, but I doubt she has significant lipid dyscrasia  as well, but we will confirm  her lipid status.   We must also keep in mind the possibility that she has an occult tumor in  her pancreas producing a focal pancreatitis.   At this time, Ms. Sylva's pancreatitis appears to be running an  uncomplicated case thus far.   RECOMMENDATIONS:  1.  Would like to switch her from morphine to Dilaudid for analgesia as      there is theoretical concerns that morphine may exacerbate pancreatitis.  2.  Will allow sips of clear liquids today.  3.  Fasting lipid profile.  4.  Assuming she has a normal creatinine, would advocate going on ahead the      first of the week getting a pancreatic protocol CT.   As a separate issue, it sounds that her bowel sounds did get better  empirically with Flagyl. The good news is there are multiple other  antibiotic choices that would likely give [place] the same clinical effect. We  can visit those options in the near future.   I have discussed my impression and recommendations with Dr. Lilyan Punt at length at the nursing station today.   I would like to thank Dr. Lilyan Punt for allowing me to see this  delightful lady in consultation.      Jonathon Bellows, M.D.  Electronically Signed     RMR/MEDQ  D:  09/28/2005  T:  09/28/2005  Job:  [id number]

## 2010-11-30 NOTE — Consult Note (Signed)
[name], [name]                ACCOUNT NO.:  [id number]   MEDICAL RECORD NO.:  [id number]           PATIENT TYPE:  AMB   LOCATION:  DAY                           FACILITY:  [facility]   PHYSICIAN:  Dennie Maizes, M.D.   DATE OF BIRTH:  26-Apr-1929   DATE OF CONSULTATION:  DATE OF DISCHARGE:                                   CONSULTATION   CHIEF COMPLAINT:  Voiding difficulty, suprapubic pain, ureteral stenosis.   HISTORY OF PRESENT ILLNESS:  This 77 year old female has been referred to me  by Dr. Lilyan Punt.  She complains of voiding difficulty and she has to  strain to empty the bladder.  She has intermittent suprapubic pain and  urinary urgency.  She did not have any urinary incontinence, hematuria,  dysuria.  There is no past history of urolithiasis.  She has urinary  frequency x4-5 and nocturia x1.  She has undergone ureteral dilation in the  office several years ago.   PAST MEDICAL HISTORY:  1.  History of depression.  2.  Hypertension.  3.  Gastroesophageal reflux disease.  4.  She has undergone gastrectomy.  5.  Hysterectomy.  6.  Appendectomy.  7.  Tonsillectomy.  8.  Abdominal surgery x3, right lower quadrant colostomy.  9.  Status post back surgery.   MEDICATIONS:  Effexor, Prevacid, hormone replacement therapy, Zyrtec,  Lopressor, Fosamax, Valium p.r.n., Benadryl p.r.n., Lasix, Topamax, Ativan,  Flonase inhaler.   ALLERGIES:  She has allregy to Tetanus toxoid .   PHYSICAL EXAMINATION:  VITAL SIGNS:  Height 5 feet, weight 109 pounds.  HEENT:  Nose and throat normal.  NECK:  Normal.  No masses.  LUNGS:  Clear to auscultation.  HEART:  Regular rate and rhythm.  No murmur.  ABDOMEN:  Soft.  No palpable flank mass.  No CVA tenderness.  Colostomy is  noted.  Bladder not palpable.  PELVIC:  Negative.   IMPRESSION:  Voiding difficulty, ureteral stenosis.   PLAN:  Cystoscopy, ureteral dilation  in [facility].  I have  discussed with the patient regarding the  diagnosis, operative details,  alternate treatments, outcome possibilities, and complications.  She has  agreed for the procedure to be done.       SK/MEDQ  D:  02/21/2005  T:  02/21/2005  Job:  [id number]   cc:   Lorin Picket A. Gerda Diss, MD  [address], [address]  [place]  [place] [address]  Fax: [phone]   [facility]  Fax: [phone]

## 2010-11-30 NOTE — Procedures (Signed)
[name], [name]                ACCOUNT NO.:  [id number]   MEDICAL RECORD NO.:  [id number]           PATIENT TYPE:  INP   LOCATION:  A213                          FACILITY:  [facility]   PHYSICIAN:  Donna Bernard, M.D.DATE OF BIRTH:  [birth year]   DATE OF PROCEDURE:  10/04/2005  DATE OF DISCHARGE:  10/07/2005                                EKG INTERPRETATION   Electrocardiogram reveals sinus tachycardia with nonspecific ST-T changes  noted.      Donna Bernard, M.D.  Electronically Signed     WSL/MEDQ  D:  12/11/2005  T:  12/11/2005  Job:  [id number]

## 2010-11-30 NOTE — Op Note (Signed)
NAMEAMARIONNA, ARCA                ACCOUNT NO.:  [id number]   MEDICAL RECORD NO.:  [id number]          PATIENT TYPE:  AMB   LOCATION:  DAY                           FACILITY:  [facility]   PHYSICIAN:  Dennie Maizes, M.D.   DATE OF BIRTH:  11/23/1928   DATE OF PROCEDURE:  02/21/2005  DATE OF DISCHARGE:                                 OPERATIVE REPORT   PREOPERATIVE DIAGNOSIS:  Voiding difficulty, ureteral stenosis.   POSTOPERATIVE DIAGNOSIS:  Voiding difficulty, ureteral stenosis.   OPERATIVE PROCEDURE:  Cystoscopy and urethral dilation.   ANESTHESIA:  General.   SURGEON:  Dr. Rito Ehrlich.   COMPLICATIONS:  None.   DRAINS:  None.   INDICATIONS FOR PROCEDURE:  A 77 year old female who has a past history of  ureteral stenosis. She developed voiding difficulty associated with  suprapubic pain. She was taken to the OR today for cystoscopy and ureteral  dilation under anesthesia.   DESCRIPTION OF PROCEDURE:  General anesthesia was induced, and the patient  was placed on the OR table in the dorsolithotomy position. The lower abdomen  and genitalia were prepped and draped in a sterile fashion. Cystoscopy was  done with a 17-French scope. The urethra, bladder neck, trigone and ureteral  orifices were normal. There was mild trabeculations of the bladder mucosa.  The bladder capacity was adequate. No abnormality was noted inside the  bladder. The cystoscope was then removed.   Urethra calibrated to 18-French, and dilation was done up to 30-French with  straight metal sounds. The patient tolerated the procedure well. She was  transferred to the PACU in a satisfactory condition.       SK/MEDQ  D:  02/21/2005  T:  02/21/2005  Job:  [id number]

## 2010-11-30 NOTE — Discharge Summary (Signed)
   NAME:  [name], [name]                          ACCOUNT NO.:  [id number]   MEDICAL RECORD NO.:  [id number]                   PATIENT TYPE:  INP   LOCATION:  A326                                 FACILITY:  [facility]   PHYSICIAN:  Scott A. Gerda Diss, M.D.               DATE OF BIRTH:  1929-03-15   DATE OF ADMISSION:  07/14/2002  DATE OF DISCHARGE:  07/18/2001                                 DISCHARGE SUMMARY   DISCHARGE DIAGNOSES:  1. Small-bowel obstruction.  2. Urinary tract infection.  3. Dehydration.   HOSPITAL COURSE:  This 77 year old white female was admitted after three day  history of abdominal pain, vomiting, inability to keep anything down,  bilious vomiting, no production from her colostomy. She has had previous  gastric surgery x2 for abdominal adhesions and presented on 12/31 with a  swollen abdomen, pain, discomfort, and presumed small-bowel obstruction.  Regular x-rays showed air fluid levels. Followup CAT scan the next morning  showed a small-bowel obstruction, and then on the evening of the second, the  patient started having several movements from her colostomy along with doing  well on a retrograde Gastrografin enema which showed a resolution of the  small-bowel obstruction. It is felt that the small-bowel obstruction was  partly due to fecal matter, partly due to twisting of the gut with the  adhesions. Because it resolved spontaneously, surgery was not necessary. The  patient was able to tolerate regular meal on 1/3 and was discharged to home  on 1/4 with regular diet. Of note, previous surgeries showed a Roux-en-Y  gastrojejunostomy in 7/00 along with left colectomy and also in 7/00 a right  hemicolectomy due to adhesions and 6/01 had an abdominal and intra-abdominal  abscess that was drained, and patient has had exploratory laparotomy with  adhesions lysis x3 prior to 4/00. So as conceived by this history, the  patient has had extensive abdominal surgery, and further  surgery would not  be ideal. This part was dictated into this discharge summary as more of a  reference for any future admissions for abdominal obstruction. The patient's  white count stayed stable during hospitalization; as well, potassium stayed  stable.   DISCHARGE INSTRUCTIONS:  The patient discharged on usual medicines. Follow  up in office within two weeks. Follow up with Dr. Katrinka Blazing if further troubles.  Recheck if problems sooner.                                               Scott A. Gerda Diss, M.D.    Linus Orn  D:  07/18/2002  T:  07/19/2002  Job:  [id number]

## 2010-11-30 NOTE — Consult Note (Signed)
[name], [name]                ACCOUNT NO.:  [id number]   MEDICAL RECORD NO.:  [id number]          PATIENT TYPE:  OUT   LOCATION:  RAD                           FACILITY:  [facility]   PHYSICIAN:  R. Roetta Sessions, M.D. DATE OF BIRTH:  August 15, 1928   DATE OF CONSULTATION:  08/13/2005  DATE OF DISCHARGE:  04/30/2005                                   CONSULTATION   REASON FOR CONSULTATION:  Heme positive stools.   HISTORY OF PRESENT ILLNESS:  Mrs.  [name] is a 77 year old Caucasian female  with complicated GI history due to multiple surgeries who presents today for  further evaluation of heme positive stools.  She saw Dr. Gerda Diss for  progressive weakness as well as weight loss.  This was back in December.  Stool hemoccults were obtained which were all three positive.  Her  hemoglobin was normal at 14.2, hematocrit 45.4, MCV was elevated at 101.3.  INR was 1.0.  B-12 was normal at 575.  She complains of fatigue and  weakness.  She also has had epigastric pain for about 3-4 days.  She feels  like she may have an ulcer.  She took Mylanta with some relief.  She is on  Prevacid chronically.  She says she lost about 10 pounds total with a gain  of about 5 pounds in the last few weeks.  Denies any chronic abdominal pain.  Denies any heartburn, on Prevacid.  No dysphagia or odynophagia.  She has  had no change in her ileostomy output.  Denies any melena or rectal  bleeding.  Complains of early satiety every since her Billroth 2 gastrectomy  in the remote past.  Peptic ulcer disease.   CURRENT MEDICATIONS:  1  Soy Care b.i.d.  1.  Cenestin 0.9 mg daily.  2.  Prevacid 30 mg daily.  3.  Lopressor 50 mg daily.  4.  Phazyme p.r.n. gas.  5.  Lactaid p.r.n.  6.  Soma p.r.n.  7.  Ambien 10 mg q.h.s.  8.  Mylanta p.r.n.  9.  K-Dur 20 mEq daily.  10. Effexor 75 mg daily.  11. Atrovent inhaler p.r.n.  12. Fosamax once weekly.  13. Valium 5 mg daily.  14. Phenergan 25 mg p.r.n.  15. Lasix p.r.n.  16. Vicodin p.r.n.   ALLERGIES:  TETANUS INJECTION.   PAST MEDICAL HISTORY:  1.  Extensive GI history.  She was evaluated exhaustively for diarrhea in      the past by Dr Karilyn Cota.  Previously felt that possibly had malabsorptive      diarrhea.  Was treated with Flagyl empirically.  Small bowel biopsy was      negative for celiac disease.  Qualitative fecal fat collection can never      be accomplished.  Prior imaging of her lower GI tract via flexible      sigmoidoscopy, incomplete colonoscopy or contrast barium enema revealed      extensive diverticular disease.  She has a history of rectosigmoid and      that was removed in 1995.  In February 1999, she had her last EGD  which      revealed a normal esophagus.  Esophagus ws stretched to a 56-French      Maloney dilator for history of dysphagia.  She had surgically altered      stomach consistent with her history of Billroth II hemigastrectomy for      peptic ulcer disease.  This was over 35 years ago.  She had friable      gastric mucosa anastomosis which was biopsied and was benign.      Colonoscopy was incomplete due to a fixed left colon from adhesions from      prior surgeries.  Study was limited to 40 cm.  This was complimented      with air contrast Barium enema which again showed progressive      diverticulosis and following sigmoid colon, distending colon and splenic      flexure.  Right colon was not well seen due to retained stool.  Previous      Clo-test has been negative.  In 1997, she had exploratory laparotomy      with lysis of adhesions __________.  She states she has had at last two      other small bowel obstructions requiring lysis of adhesions.  In April      2000, she had right colectomy with ileostomy because of stercoraceous      obstruction with gangrenous cecum in the right colon.  She had a near      complete obstruction of the small bowel due to extensive adhesions.  In      July 2000, she had a left colectomy,  although, it appears part of her      sigmoid colon and rectum remain.  She had a segment of her small bowel      resection and a Roux-en-Y gastrojejunostomy.  She also had a      cholecystectomy at that time.  She has had a prior appendectomy.  2.  Prior back surgery.  3.  History of incisional hernia repair previously.  4.  Body neurosis.  5.  Bronchitis.  6.  Irregular heart rate.  7.  IBS.  8.  Gastroesophageal reflux disease.   FAMILY HISTORY:  Mother and father both died of stroke.   SOCIAL HISTORY:  She is married.  She is retired from [employer].  She is a nonsmoker.  No alcohol use.   REVIEW OF SYSTEMS:  See HPI for GI and CONSTITUTIONAL.  CARDIOPULMONARY:  Denies any chest pain or shortness of breath.  GENITOURINARY:  No dysuria.   PHYSICAL EXAMINATION:  VITAL SIGNS:  Weight 108.5, height 5 feet 1/2 inch,  temperature 98.2, blood pressure 110/64, pulse 76.  GENERAL APPEARANCE:  Pleasant, thin, Caucasian female in no acute distress.  SKIN:  Warm and dry, no jaundice.  HEENT:  Pupils equal, round and reactive to light.  Conjunctivae are pink.  Sclerae are nonicteric.  Oropharyngeal mucosa moist and pink.  No lesions,  erythema or exudate.  No lymphadenopathy or thyromegaly.  CHEST:  Lungs are clear to auscultation.  CARDIAC:  Regular rate and rhythm.  Normal S1, S2.  No murmurs, rubs or  gallops.  ABDOMEN:  Positive bowel sounds, soft, nondistended.  She has multiple well-  healed incisions.  Ileostomy in the right mid abdomen.  Mild epigastric  tenderness, otherwise, abdomen is unremarkable.  No abdominal hernias or  bruits noted.  EXTREMITIES:  No edema.   IMPRESSION:  [name] is a 77 year old lady with  extensive GI history  with multiple abdominal surgery who presents for further evaluation of  hemoccult positive stool.  Stool is obtained from her ileostomy.  This would indicate a source of GI bleeding from her ileostomy and the above.  She has  a  complicated history of prior peptic ulcer disease status post Billroth II,  hemigastrectomy.  Having some mild epigastric pain.  __________ to evaluate  for upper GI tract.  In addition, according to her prior operative report.  She has very limited colon left.  It appears that she has primarily part of  her sigmoid colon and rectum remaining.  This has not been evaluated since  1991.  She has a history of adenomatous polyps and suspect she will need to  have a flexible sigmoidoscopy as well.  I will discuss this further with Dr.  Bethann Goo. Karilyn Cota prior to scheduling, however.   PLAN:  Will go ahead and schedule upper endoscopy.  The patient requests  this to be done with Dr.  Karilyn Cota, and she is aware he is out of town and it  will be a couple of weeks before we can get her on schedule.  We will  discuss with Dr. Jena Gauss and Dr. Karilyn Cota whether she will need to have a  flexible sigmoidoscopy as well primarily for surveillance given history of  adenomatous polyps.      Tana Coast, P.AJonathon Bellows, M.D.  Electronically Signed    LL/MEDQ  D:  08/13/2005  T:  08/14/2005  Job:  [id number]

## 2010-11-30 NOTE — H&P (Signed)
NAMESENIE, [name]                          ACCOUNT NO.:  [id number]   MEDICAL RECORD NO.:  [id number]                   PATIENT TYPE:  EMS   LOCATION:  ED                                   FACILITY:  [facility]   PHYSICIAN:  Scott A. Gerda Diss, M.D.               DATE OF BIRTH:  08-02-1928   DATE OF ADMISSION:  07/14/2002  DATE OF DISCHARGE:                                HISTORY & PHYSICAL   CHIEF COMPLAINT:  Vomiting, abdominal pain.   HISTORY OF PRESENT ILLNESS:  This is a 77 year old white female with a  history of a colostomy status post a hemicolectomy secondary to repeated  small bowel obstruction.  She has had a couple of different surgeries over  the past several years - one in 1997, one in 2000.  The one in 2000 was  complicated by a GI bleed which was unable to be controlled here and she was  transferred down to [facility], underwent significant intensive care therapy  there, and it was felt after that surgery that if she needed further surgery  it would need to be done at [facility] - partly because of the lack of ability  to take care of the significant adhesions that she has here.  Her colostomy  has been followed by Dr. Elpidio Anis who has done the previous surgeries,  and she has been followed by [place] medically.  She relates about three days ago  she started having nausea, vomiting, intermittent abdominal pain, now  becoming more of a constant abdominal pain.  She denies blood in the  vomitus, denies any blood in the colostomy bag.  Denies high fevers, chills.  She relates some urinary frequency but no dysuria.  She relates decreased  activity, decreased oral intake.  Denies difficulty breathing or chest  discomfort.   PAST MEDICAL HISTORY:  1. Small bowel adhesions with surgery in 1997 and July 2000.  2. GI bleed in July 2000.  3. COPD.  4. Hyperlipidemia.  5. Migraines.  6. Irritable bowels.  7. Generalized anxiety disorder.  8. Anemia.   MEDICATION LIST:  1. Effexor  150 mg daily.  2. Cenestin one daily.  3. K-Dur 20 mEq one daily.  4. Prevacid 30 mg one daily.  5. Lopressor 50 mg one daily.  6. One-A-Day vitamin daily.  7. Valium 5 mg two daily.  8. Ambien one q.h.s.  9. Fosamax 70 mg once weekly.  10.      Darvocet p.r.n.  11.      Atrovent q.i.d. p.r.n.   FAMILY HISTORY:  Pertinent for HT and diabetes.   SOCIAL HISTORY:  Does not smoke or drink.   ALLERGIES:  TETANUS SHOT.   REVIEW OF SYSTEMS:  See per above.   PHYSICAL EXAMINATION:  GENERAL:  NAD.  Looks to feel ill but not toxic.  HEENT:  Benign.  CHEST:  CTA.  HEART:  Tachycardic  but regular.  ABDOMEN:  Soft, slight abdominal distention.  Generalized tenderness but no  guarding or rebound.  Decreased bowel sounds are noted.  No blood in the  colostomy bag.  EXTREMITIES:  No edema.  SKIN:  Warm and dry.  VITAL SIGNS:  Orthostatic in the office.   LABORATORY DATA:  Including MET-7, liver profile, lipase overall pretty  decent.  White count slightly elevated, no shift.  Urinalysis shows bacteria  and wbc's.  X-ray shows air-fluid levels in small bowel pattern.   ASSESSMENT AND PLAN:  1. Urinary tract infection.  Rocephin daily.  2. Dehydration.  IV fluids as per orders.  3. Small bowel obstruction - partial versus complete.  The patient is not a     good surgical candidate currently.  Will try conservative care, see if     this resolves over the next few days.  If it does then will reintroduce     oral feedings over the course of the next several days.  If it does not     resolve, the patient then will need potential surgery at [facility].  Dr.     Elpidio Anis has done a very good job of taking care of her here, but on     her last surgery it was felt because of the severe amount of adhesions,     he stated that he thought further surgery would need to be done at     [facility], but for now, will admit the patient here and follow closely.                                                Scott A. Gerda Diss, M.D.    Linus Orn  D:  07/14/2002  T:  07/15/2002  Job:  [id number]

## 2011-01-01 ENCOUNTER — Other Ambulatory Visit: Payer: Self-pay | Admitting: Family Medicine

## 2011-01-01 DIAGNOSIS — R911 Solitary pulmonary nodule: Secondary | ICD-10-CM

## 2011-01-11 ENCOUNTER — Encounter (HOSPITAL_COMMUNITY)
Admission: RE | Admit: 2011-01-11 | Discharge: 2011-01-11 | Disposition: A | Discharge status: Home / Self Care | Payer: Medicare Other | Source: Ambulatory Visit | Attending: Family Medicine | Admitting: Family Medicine

## 2011-01-11 ENCOUNTER — Encounter (HOSPITAL_COMMUNITY): Payer: Self-pay

## 2011-01-11 DIAGNOSIS — K449 Diaphragmatic hernia without obstruction or gangrene: Secondary | ICD-10-CM | POA: Insufficient documentation

## 2011-01-11 DIAGNOSIS — Z79899 Other long term (current) drug therapy: Secondary | ICD-10-CM | POA: Insufficient documentation

## 2011-01-11 DIAGNOSIS — Z90710 Acquired absence of both cervix and uterus: Secondary | ICD-10-CM | POA: Insufficient documentation

## 2011-01-11 DIAGNOSIS — Z932 Ileostomy status: Secondary | ICD-10-CM | POA: Insufficient documentation

## 2011-01-11 DIAGNOSIS — R911 Solitary pulmonary nodule: Secondary | ICD-10-CM

## 2011-01-11 DIAGNOSIS — J984 Other disorders of lung: Secondary | ICD-10-CM | POA: Insufficient documentation

## 2011-01-11 DIAGNOSIS — R222 Localized swelling, mass and lump, trunk: Secondary | ICD-10-CM | POA: Insufficient documentation

## 2011-01-11 MED ORDER — FLUDEOXYGLUCOSE F - 18 (FDG) INJECTION
15.6000 | Freq: Once | INTRAVENOUS | Status: AC | PRN
  Administered 2011-01-11: 15.6 via INTRAVENOUS

## 2011-02-21 ENCOUNTER — Encounter (INDEPENDENT_AMBULATORY_CARE_PROVIDER_SITE_OTHER): Payer: Self-pay

## 2011-03-14 ENCOUNTER — Other Ambulatory Visit (INDEPENDENT_AMBULATORY_CARE_PROVIDER_SITE_OTHER): Payer: Self-pay | Admitting: *Deleted

## 2011-03-14 NOTE — Telephone Encounter (Signed)
Fax rec'd for a refill on Domperidone 10 mg Capsule #60 Capsule, Take 1 Capsule By Mouth Twice Daily. This was last filled on 02-18-11. The request was faxed back to [facility], approved by Delrae Rend

## 2011-03-26 ENCOUNTER — Other Ambulatory Visit: Payer: Self-pay | Admitting: Family Medicine

## 2011-03-26 DIAGNOSIS — R9389 Abnormal findings on diagnostic imaging of other specified body structures: Secondary | ICD-10-CM

## 2011-03-28 ENCOUNTER — Other Ambulatory Visit: Payer: Self-pay | Admitting: Family Medicine

## 2011-03-28 ENCOUNTER — Ambulatory Visit (HOSPITAL_COMMUNITY)
Admission: RE | Admit: 2011-03-28 | Discharge: 2011-03-28 | Disposition: A | Discharge status: Home / Self Care | Payer: Medicare Other | Source: Ambulatory Visit | Attending: Family Medicine | Admitting: Family Medicine

## 2011-03-28 DIAGNOSIS — R222 Localized swelling, mass and lump, trunk: Secondary | ICD-10-CM | POA: Insufficient documentation

## 2011-03-28 DIAGNOSIS — M542 Cervicalgia: Secondary | ICD-10-CM

## 2011-03-28 DIAGNOSIS — R9389 Abnormal findings on diagnostic imaging of other specified body structures: Secondary | ICD-10-CM

## 2011-03-28 DIAGNOSIS — J984 Other disorders of lung: Secondary | ICD-10-CM | POA: Insufficient documentation

## 2011-03-28 MED ORDER — IOHEXOL 300 MG/ML  SOLN
64.0000 mL | Freq: Once | INTRAMUSCULAR | Status: AC | PRN
  Administered 2011-03-28: 64 mL via INTRAVENOUS

## 2011-04-08 ENCOUNTER — Ambulatory Visit (INDEPENDENT_AMBULATORY_CARE_PROVIDER_SITE_OTHER): Payer: Medicare Other | Admitting: Internal Medicine

## 2011-04-09 LAB — CREATININE, SERUM
Creatinine, Ser: 1.14
GFR calc Af Amer: 56 — ABNORMAL LOW
GFR calc non Af Amer: 46 — ABNORMAL LOW

## 2011-04-12 LAB — BASIC METABOLIC PANEL
BUN: 11
CO2: 24
Calcium: 9.4
Chloride: 109
Creatinine, Ser: 0.93
GFR calc Af Amer: >60
GFR calc non Af Amer: 47 — ABNORMAL LOW
Glucose, Bld: 133 — ABNORMAL HIGH
Glucose, Bld: 80
Potassium: 6.2 — ABNORMAL HIGH
Sodium: 136
Sodium: 138

## 2011-04-12 LAB — URINALYSIS, ROUTINE W REFLEX MICROSCOPIC
Glucose, UA: NEGATIVE
Ketones, ur: NEGATIVE
Nitrite: NEGATIVE
Specific Gravity, Urine: >1.03 — ABNORMAL HIGH
pH: 5

## 2011-04-12 LAB — CBC
Platelets: 237
RDW: 15.1
WBC: 9.6

## 2011-04-12 LAB — DIFFERENTIAL
Basophils Absolute: 0
Lymphocytes Relative: 47 — ABNORMAL HIGH
Lymphs Abs: 4.5 — ABNORMAL HIGH
Neutro Abs: 3.8
Neutrophils Relative %: 40 — ABNORMAL LOW

## 2011-04-19 LAB — DIFFERENTIAL
Basophils Absolute: 0.1 10*3/uL (ref 0.0–0.1)
Basophils Relative: 1 % (ref 0–1)
Basophils Relative: 1 % (ref 0–1)
Eosinophils Absolute: 0.1 10*3/uL (ref 0.0–0.7)
Eosinophils Relative: 1 % (ref 0–5)
Eosinophils Relative: 1 % (ref 0–5)
Eosinophils Relative: 3 % (ref 0–5)
Lymphocytes Relative: 21 % (ref 12–46)
Lymphocytes Relative: 43 % (ref 12–46)
Lymphs Abs: 5.1 10*3/uL — ABNORMAL HIGH (ref 0.7–4.0)
Monocytes Absolute: 0.7 10*3/uL (ref 0.1–1.0)
Monocytes Absolute: 0.9 10*3/uL (ref 0.1–1.0)
Monocytes Absolute: 1.4 10*3/uL — ABNORMAL HIGH (ref 0.1–1.0)
Monocytes Relative: 7 % (ref 3–12)
Neutro Abs: 7 10*3/uL (ref 1.7–7.7)

## 2011-04-19 LAB — CBC
HCT: 47.7 % — ABNORMAL HIGH (ref 36.0–46.0)
HCT: 48.7 % — ABNORMAL HIGH (ref 36.0–46.0)
Hemoglobin: 15.4 g/dL — ABNORMAL HIGH (ref 12.0–15.0)
Hemoglobin: 15.9 g/dL — ABNORMAL HIGH (ref 12.0–15.0)
MCHC: 32.3 g/dL (ref 30.0–36.0)
MCHC: 32.6 g/dL (ref 30.0–36.0)
MCV: 104 fL — ABNORMAL HIGH (ref 78.0–100.0)
MCV: 104.4 fL — ABNORMAL HIGH (ref 78.0–100.0)
Platelets: 256 10*3/uL (ref 150–400)
RBC: 4.57 MIL/uL (ref 3.87–5.11)
RDW: 13.9 % (ref 11.5–15.5)
WBC: 11.8 10*3/uL — ABNORMAL HIGH (ref 4.0–10.5)

## 2011-04-19 LAB — BASIC METABOLIC PANEL
CO2: 25 mEq/L (ref 19–32)
CO2: 31 mEq/L (ref 19–32)
Chloride: 102 mEq/L (ref 96–112)
GFR calc Af Amer: >60 mL/min (ref >60)
Glucose, Bld: 118 mg/dL — ABNORMAL HIGH (ref 70–99)
Glucose, Bld: 84 mg/dL (ref 70–99)
Potassium: 3.4 mEq/L — ABNORMAL LOW (ref 3.5–5.1)
Potassium: 4.3 mEq/L (ref 3.5–5.1)
Sodium: 139 mEq/L (ref 135–145)
Sodium: 140 mEq/L (ref 135–145)

## 2011-04-19 LAB — URINALYSIS, ROUTINE W REFLEX MICROSCOPIC
Bilirubin Urine: NEGATIVE
Glucose, UA: NEGATIVE mg/dL
Hgb urine dipstick: NEGATIVE
Nitrite: NEGATIVE
Protein, ur: NEGATIVE mg/dL
Specific Gravity, Urine: 1.02 (ref 1.005–1.030)
Specific Gravity, Urine: >1.03 — ABNORMAL HIGH (ref 1.005–1.030)
Urobilinogen, UA: 0.2 mg/dL (ref 0.0–1.0)

## 2011-04-19 LAB — URINE CULTURE: Colony Count: 2000

## 2011-04-19 LAB — HEPATIC FUNCTION PANEL
AST: 28 U/L (ref 0–37)
Bilirubin, Direct: 0.2 mg/dL (ref 0.0–0.3)
Indirect Bilirubin: 0.9 mg/dL (ref 0.3–0.9)
Total Bilirubin: 1.1 mg/dL (ref 0.3–1.2)

## 2011-04-19 LAB — COMPREHENSIVE METABOLIC PANEL
AST: 23 U/L (ref 0–37)
Albumin: 3.1 g/dL — ABNORMAL LOW (ref 3.5–5.2)
Calcium: 9.1 mg/dL (ref 8.4–10.5)
Chloride: 102 mEq/L (ref 96–112)
Creatinine, Ser: 0.91 mg/dL (ref 0.4–1.2)
GFR calc Af Amer: >60 mL/min (ref >60)
Total Bilirubin: 1.1 mg/dL (ref 0.3–1.2)

## 2011-04-30 LAB — DIFFERENTIAL
Band Neutrophils: 1
Lymphocytes Relative: 48 — ABNORMAL HIGH
Neutrophils Relative %: 37 — ABNORMAL LOW

## 2011-04-30 LAB — BASIC METABOLIC PANEL
BUN: 9
Calcium: 9.5
Creatinine, Ser: 1.04
GFR calc non Af Amer: 52 — ABNORMAL LOW
Glucose, Bld: 84
Sodium: 138

## 2011-04-30 LAB — CBC
Hemoglobin: 15.8 — ABNORMAL HIGH
MCHC: 32.8
Platelets: 300
RDW: 14.3 — ABNORMAL HIGH

## 2011-04-30 LAB — APTT: aPTT: 38 — ABNORMAL HIGH

## 2011-04-30 LAB — PROTIME-INR: INR: 1

## 2011-05-27 ENCOUNTER — Ambulatory Visit (INDEPENDENT_AMBULATORY_CARE_PROVIDER_SITE_OTHER): Payer: Medicare Other | Admitting: Internal Medicine

## 2011-05-27 ENCOUNTER — Encounter (INDEPENDENT_AMBULATORY_CARE_PROVIDER_SITE_OTHER): Payer: Self-pay | Admitting: Internal Medicine

## 2011-05-27 DIAGNOSIS — K6389 Other specified diseases of intestine: Secondary | ICD-10-CM

## 2011-05-27 DIAGNOSIS — K219 Gastro-esophageal reflux disease without esophagitis: Secondary | ICD-10-CM

## 2011-05-27 MED ORDER — LANSOPRAZOLE 30 MG PO CPDR: 30.0000 mg | DELAYED_RELEASE_CAPSULE | Freq: Every day | ORAL | Status: DC

## 2011-05-27 MED ORDER — RIFAXIMIN 200 MG PO TABS: 200.0000 mg | ORAL_TABLET | Freq: Three times a day (TID) | ORAL | Status: AC

## 2011-05-27 NOTE — Patient Instructions (Signed)
Xifaxan 200 mg 3 times a day for 2 weeks. Resume lansoprazole 30 mg by mouth daily before breakfast. Call us with a progress report when you finish Xifaxan.

## 2011-05-27 NOTE — Progress Notes (Signed)
Presenting complaint; followup for gastroparesis; patient complains of intractable flatulence. Subjective; patient is a 77-year-old Caucasian female who is here for scheduled visit. She was last seen in February 2012. She is experiencing more nausea lately but denies vomiting. She also complains of excessive flatulence. She is having to get up in the middle of night to empty her back. He also has noted some change in consistency of her stool is more liquids. She is having gas pains. She denies eating or tarry stools. She denies rectal discharge. She's not sure why her PPI was discontinued. He is also wondering how she will be able to get domperidone since its not available locally anymore. She has lost 4 pounds since her last visit Current medications Current Outpatient Prescriptions on File Prior to Visit  Medication Sig Dispense Refill  . alendronate (FOSAMAX) 70 MG tablet Take 70 mg by mouth every 7 (seven) days. Take with a full glass of water on an empty stomach.       . bismuth subsalicylate (PEPTO BISMOL) 262 MG chewable tablet Chew 524 mg by mouth as needed.        . diazepam (VALIUM) 5 MG tablet Take 5 mg by mouth every 6 (six) hours as needed.        Marland Kitchen estrogens conjugated, synthetic A, (CENESTIN) 0.625 MG tablet Take 0.625 mg by mouth daily.        . furosemide (LASIX) 20 MG tablet Take 20 mg by mouth as needed.       Marland Kitchen HYDROcodone-acetaminophen (VICODIN) 5-500 MG per tablet Take 1 tablet by mouth every 6 (six) hours as needed.        . memantine (NAMENDA) 10 MG tablet Take 10 mg by mouth 2 (two) times daily.        . metoprolol (TOPROL-XL) 50 MG 24 hr tablet Take 50 mg by mouth daily.        . ondansetron (ZOFRAN) 4 MG tablet Take 4 mg by mouth every 8 (eight) hours as needed.        . Simethicone (PHAZYME PO) Take by mouth.       . venlafaxine (EFFEXOR) 75 MG tablet Take 75 mg by mouth. 3 pills a day       Objective; BP 96/54  Pulse 72  Temp 98.4 F (36.9 C)  Ht 4\' 11"  (1.499 m)   Wt 91 lb (41.277 kg)  BMI 18.38 kg/m2 Conjunctiva is pink. Sclerae nonicteric. Oropharyngeal mucosa is normal. No neck masses or thyromegaly noted. Abdomen is flat. Bowel sounds are hyperactive. No organomegaly or masses noted. Ileostomy bag has gas in it. No LE edema noted. Assessment 1. Gastroparesis. She has done very well with combination of PPI and low-dose domperidone. She'll have to find that if [place]. 2.    Excessive flatulence and change in consistency of her stools suggest small bowel bacterial overgrowth. Plan  Resume lansoprazole at 30 mg by mouth every morning. Xifaxan 200 mg 3 times a day for 2 weeks. Should advised to call us with a progress report when she finishes antibiotic. Her nursing aide will find that if patient [place]. Office visit in 6 months.

## 2011-06-25 ENCOUNTER — Telehealth (INDEPENDENT_AMBULATORY_CARE_PROVIDER_SITE_OTHER): Payer: Self-pay | Admitting: *Deleted

## 2011-06-25 NOTE — Telephone Encounter (Signed)
LM stating she needed to speak with [name]. Please return the call to [phone].

## 2011-07-03 NOTE — Telephone Encounter (Signed)
I have spoke with the patient. She says that [facility] had faxed her request for medications to Dr. Cathlyn Parsons office. Some of these were prescriptions that Dr. Karilyn Cota had ordered. She ask that I check with them and see if I could get this done. I have tried to call [facility] but apparently they are having trouble with their phones. I will keep trying and will keep patient updated.

## 2011-07-24 DIAGNOSIS — M545 Low back pain: Secondary | ICD-10-CM | POA: Diagnosis not present

## 2011-07-24 DIAGNOSIS — M999 Biomechanical lesion, unspecified: Secondary | ICD-10-CM | POA: Diagnosis not present

## 2011-07-24 DIAGNOSIS — M5137 Other intervertebral disc degeneration, lumbosacral region: Secondary | ICD-10-CM | POA: Diagnosis not present

## 2011-07-25 DIAGNOSIS — M999 Biomechanical lesion, unspecified: Secondary | ICD-10-CM | POA: Diagnosis not present

## 2011-07-25 DIAGNOSIS — M5137 Other intervertebral disc degeneration, lumbosacral region: Secondary | ICD-10-CM | POA: Diagnosis not present

## 2011-07-25 DIAGNOSIS — M545 Low back pain: Secondary | ICD-10-CM | POA: Diagnosis not present

## 2011-08-13 ENCOUNTER — Other Ambulatory Visit (HOSPITAL_COMMUNITY): Payer: Self-pay | Admitting: Neurological Surgery

## 2011-08-13 DIAGNOSIS — M48061 Spinal stenosis, lumbar region without neurogenic claudication: Secondary | ICD-10-CM | POA: Diagnosis not present

## 2011-08-13 DIAGNOSIS — M545 Low back pain: Secondary | ICD-10-CM

## 2011-08-13 DIAGNOSIS — IMO0002 Reserved for concepts with insufficient information to code with codable children: Secondary | ICD-10-CM | POA: Diagnosis not present

## 2011-08-16 ENCOUNTER — Other Ambulatory Visit (HOSPITAL_COMMUNITY): Payer: Self-pay | Admitting: Neurological Surgery

## 2011-08-16 ENCOUNTER — Ambulatory Visit (HOSPITAL_COMMUNITY)
Admission: RE | Admit: 2011-08-16 | Discharge: 2011-08-16 | Disposition: A | Discharge status: Home / Self Care | Payer: Medicare Other | Source: Ambulatory Visit | Attending: Neurological Surgery | Admitting: Neurological Surgery

## 2011-08-16 DIAGNOSIS — M5380 Other specified dorsopathies, site unspecified: Secondary | ICD-10-CM | POA: Diagnosis not present

## 2011-08-16 DIAGNOSIS — M545 Low back pain, unspecified: Secondary | ICD-10-CM | POA: Insufficient documentation

## 2011-08-16 DIAGNOSIS — M47817 Spondylosis without myelopathy or radiculopathy, lumbosacral region: Secondary | ICD-10-CM | POA: Diagnosis not present

## 2011-08-16 DIAGNOSIS — M5137 Other intervertebral disc degeneration, lumbosacral region: Secondary | ICD-10-CM | POA: Diagnosis not present

## 2011-08-16 DIAGNOSIS — M5126 Other intervertebral disc displacement, lumbar region: Secondary | ICD-10-CM | POA: Diagnosis not present

## 2011-08-16 DIAGNOSIS — M79609 Pain in unspecified limb: Secondary | ICD-10-CM | POA: Insufficient documentation

## 2011-08-16 LAB — CREATININE, SERUM
GFR calc Af Amer: 38 mL/min — ABNORMAL LOW (ref >90)
GFR calc non Af Amer: 33 mL/min — ABNORMAL LOW (ref >90)

## 2011-08-16 LAB — GLUCOSE, CAPILLARY: Glucose-Capillary: 72 mg/dL (ref 70–99)

## 2011-08-27 DIAGNOSIS — M48061 Spinal stenosis, lumbar region without neurogenic claudication: Secondary | ICD-10-CM | POA: Diagnosis not present

## 2011-08-27 DIAGNOSIS — M47812 Spondylosis without myelopathy or radiculopathy, cervical region: Secondary | ICD-10-CM | POA: Diagnosis not present

## 2011-09-05 DIAGNOSIS — IMO0002 Reserved for concepts with insufficient information to code with codable children: Secondary | ICD-10-CM | POA: Diagnosis not present

## 2011-09-05 DIAGNOSIS — M48061 Spinal stenosis, lumbar region without neurogenic claudication: Secondary | ICD-10-CM | POA: Diagnosis not present

## 2011-10-16 DIAGNOSIS — K589 Irritable bowel syndrome without diarrhea: Secondary | ICD-10-CM | POA: Diagnosis not present

## 2011-10-21 DIAGNOSIS — M545 Low back pain: Secondary | ICD-10-CM | POA: Diagnosis not present

## 2011-10-21 DIAGNOSIS — J301 Allergic rhinitis due to pollen: Secondary | ICD-10-CM | POA: Diagnosis not present

## 2011-10-22 DIAGNOSIS — H1045 Other chronic allergic conjunctivitis: Secondary | ICD-10-CM | POA: Diagnosis not present

## 2011-10-22 DIAGNOSIS — H02059 Trichiasis without entropian unspecified eye, unspecified eyelid: Secondary | ICD-10-CM | POA: Diagnosis not present

## 2011-10-22 DIAGNOSIS — Z961 Presence of intraocular lens: Secondary | ICD-10-CM | POA: Diagnosis not present

## 2011-11-18 ENCOUNTER — Ambulatory Visit (HOSPITAL_COMMUNITY)
Admission: RE | Admit: 2011-11-18 | Discharge: 2011-11-18 | Disposition: A | Discharge status: Home / Self Care | Payer: Medicare Other | Source: Ambulatory Visit | Attending: Family Medicine | Admitting: Family Medicine

## 2011-11-18 ENCOUNTER — Other Ambulatory Visit: Payer: Self-pay | Admitting: Family Medicine

## 2011-11-18 DIAGNOSIS — R5383 Other fatigue: Secondary | ICD-10-CM | POA: Diagnosis not present

## 2011-11-18 DIAGNOSIS — J9819 Other pulmonary collapse: Secondary | ICD-10-CM | POA: Insufficient documentation

## 2011-11-18 DIAGNOSIS — R05 Cough: Secondary | ICD-10-CM

## 2011-11-18 DIAGNOSIS — J438 Other emphysema: Secondary | ICD-10-CM | POA: Diagnosis not present

## 2011-11-18 DIAGNOSIS — J90 Pleural effusion, not elsewhere classified: Secondary | ICD-10-CM | POA: Diagnosis not present

## 2011-11-18 DIAGNOSIS — J984 Other disorders of lung: Secondary | ICD-10-CM | POA: Diagnosis not present

## 2011-11-18 DIAGNOSIS — J209 Acute bronchitis, unspecified: Secondary | ICD-10-CM | POA: Diagnosis not present

## 2011-11-18 DIAGNOSIS — R509 Fever, unspecified: Secondary | ICD-10-CM | POA: Diagnosis not present

## 2011-11-18 DIAGNOSIS — R5381 Other malaise: Secondary | ICD-10-CM | POA: Diagnosis not present

## 2011-11-20 ENCOUNTER — Other Ambulatory Visit: Payer: Self-pay | Admitting: Family Medicine

## 2011-11-20 DIAGNOSIS — R911 Solitary pulmonary nodule: Secondary | ICD-10-CM

## 2011-11-20 DIAGNOSIS — J42 Unspecified chronic bronchitis: Secondary | ICD-10-CM | POA: Diagnosis not present

## 2011-11-26 ENCOUNTER — Ambulatory Visit (INDEPENDENT_AMBULATORY_CARE_PROVIDER_SITE_OTHER): Payer: Medicare Other | Admitting: Internal Medicine

## 2011-11-26 ENCOUNTER — Encounter (INDEPENDENT_AMBULATORY_CARE_PROVIDER_SITE_OTHER): Payer: Self-pay | Admitting: Internal Medicine

## 2011-11-26 VITALS — BP 108/70 | HR 72 | Temp 98.5°F | Resp 18 | Ht 59.0 in | Wt 91.3 lb

## 2011-11-26 DIAGNOSIS — R142 Eructation: Secondary | ICD-10-CM | POA: Diagnosis not present

## 2011-11-26 DIAGNOSIS — M810 Age-related osteoporosis without current pathological fracture: Secondary | ICD-10-CM | POA: Insufficient documentation

## 2011-11-26 DIAGNOSIS — R143 Flatulence: Secondary | ICD-10-CM | POA: Insufficient documentation

## 2011-11-26 DIAGNOSIS — K3184 Gastroparesis: Secondary | ICD-10-CM

## 2011-11-26 DIAGNOSIS — R141 Gas pain: Secondary | ICD-10-CM | POA: Diagnosis not present

## 2011-11-26 DIAGNOSIS — Z932 Ileostomy status: Secondary | ICD-10-CM | POA: Insufficient documentation

## 2011-11-26 MED ORDER — ALIGN PO CAPS: 1.0000 | ORAL_CAPSULE | Freq: Every day | ORAL | Status: AC

## 2011-11-26 NOTE — Patient Instructions (Signed)
Align 1 capsule by mouth daily. If no improvement noted can stop after 30 days

## 2011-12-05 ENCOUNTER — Other Ambulatory Visit (HOSPITAL_COMMUNITY): Payer: Medicare Other

## 2011-12-05 NOTE — Progress Notes (Signed)
Presenting complaint;  Followup for GERD and flatulence. Subjective:  [name] is a year-old Caucasian female who is here for scheduled visit. She was last seen 6 months ago. 3 months ago she was treated with xifaxan for intractable flatulence. Now she has the same problem again. This is associated with cramps. Pepto-Bismol has helped some. Her appetite is good and her weight is stable. She is having difficulty in chewing her food because of potential problems. She states this is a third set doesn't fit. She reports no change in ileostomy output or melena. She has occasional heartburn and regurgitation. She was treated with antibiotic for bronchitis and not sure if it helped her flatulence. She is taking pain med on average once a day.  Current Medications: Current Outpatient Prescriptions on File Prior to Visit  Medication Sig Dispense Refill  . alendronate (FOSAMAX) 70 MG tablet Take 70 mg by mouth every 7 (seven) days. Take with a full glass of water on an empty stomach.       . bismuth subsalicylate (PEPTO BISMOL) 262 MG chewable tablet Chew 524 mg by mouth as needed.        . diazepam (VALIUM) 5 MG tablet Take 5 mg by mouth every 6 (six) hours as needed.        . furosemide (LASIX) 20 MG tablet Take 20 mg by mouth as needed.       Marland Kitchen HYDROcodone-acetaminophen (VICODIN) 5-500 MG per tablet Take 1 tablet by mouth every 6 (six) hours as needed.        . memantine (NAMENDA) 10 MG tablet Take 10 mg by mouth 2 (two) times daily.        . metoprolol (TOPROL-XL) 50 MG 24 hr tablet Take 50 mg by mouth daily.        . ondansetron (ZOFRAN) 4 MG tablet Take 4 mg by mouth every 8 (eight) hours as needed.        . Simethicone (PHAZYME PO) Take by mouth.       . venlafaxine (EFFEXOR) 75 MG tablet Take 75 mg by mouth. 3 pills a day         Objective: Blood pressure 108/70, pulse 72, temperature 98.5 F (36.9 C), temperature source Oral, resp. rate 18, height 4\' 11"  (1.499 m), weight 91 lb 4.8 oz (41.413  kg). Conjunctiva is pink. Sclera is nonicteric Oropharyngeal mucosa is normal. No neck masses or thyromegaly noted. Abdomen is flat. Ileostomy is located in right lower quadrant of her abdomen bowel sounds are normal. Soft abdomen without tenderness again a megaly or masses. Percussion note is somewhat tympanitic.  No LE edema or clubbing noted.   Assessment:  #1. She has history of gastroparesis he is tender at his she is doing well off domperidone which is currently not available locally. I believe watching her diet is helping. #2. Excessive flatulence. Suspect this may be related to use of antibiotic for bronchitis. She may also have small bowel bacterial overgrowth. If she does not respond to robotic voice consider retreating her with Xifaxan.   Plan: Align 1 capsule by mouth daily. Eat yogurt one cup daily. Call with progress report in one month. Office visit in 4 months.

## 2011-12-06 ENCOUNTER — Ambulatory Visit (HOSPITAL_COMMUNITY)
Admission: RE | Admit: 2011-12-06 | Discharge: 2011-12-06 | Disposition: A | Discharge status: Home / Self Care | Payer: Medicare Other | Source: Ambulatory Visit | Attending: Family Medicine | Admitting: Family Medicine

## 2011-12-06 DIAGNOSIS — J438 Other emphysema: Secondary | ICD-10-CM | POA: Diagnosis not present

## 2011-12-06 DIAGNOSIS — R911 Solitary pulmonary nodule: Secondary | ICD-10-CM

## 2011-12-06 DIAGNOSIS — J984 Other disorders of lung: Secondary | ICD-10-CM | POA: Insufficient documentation

## 2011-12-06 DIAGNOSIS — R918 Other nonspecific abnormal finding of lung field: Secondary | ICD-10-CM | POA: Diagnosis not present

## 2011-12-06 MED ORDER — IOHEXOL 300 MG/ML  SOLN
80.0000 mL | Freq: Once | INTRAMUSCULAR | Status: AC | PRN
  Administered 2011-12-06: 80 mL via INTRAVENOUS

## 2011-12-10 DIAGNOSIS — J449 Chronic obstructive pulmonary disease, unspecified: Secondary | ICD-10-CM | POA: Diagnosis not present

## 2011-12-10 DIAGNOSIS — D7289 Other specified disorders of white blood cells: Secondary | ICD-10-CM | POA: Diagnosis not present

## 2012-02-07 IMAGING — CT CT CHEST W/ CM
2 of 4 series · 14 of 36 positions shown, 17 images · IV contrast (Omnipaque 300)
Comparison: 09/22/2009, 11/30/2008

CLINICAL DATA: COPD, pulmonary nodules, follow-up

CT CHEST WITH CONTRAST
TECHNIQUE: Multidetector CT imaging of the chest was performed
following the standard protocol during bolus administration of
intravenous contrast. Breast shield utilized.  Sagittal and coronal
MPR images reconstructed from axial data set.
Contrast: 80 ml Amnipaque-S11 IV

[Series 3: mpr coronal chest 3mm · coronal · 0.49mm/px · 3 of 64 slices shown]
[im 13/64  lung]
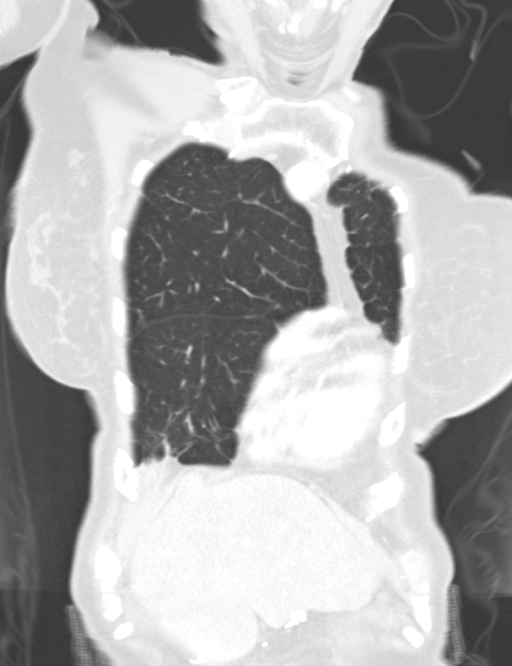
[im 26/64  lung]
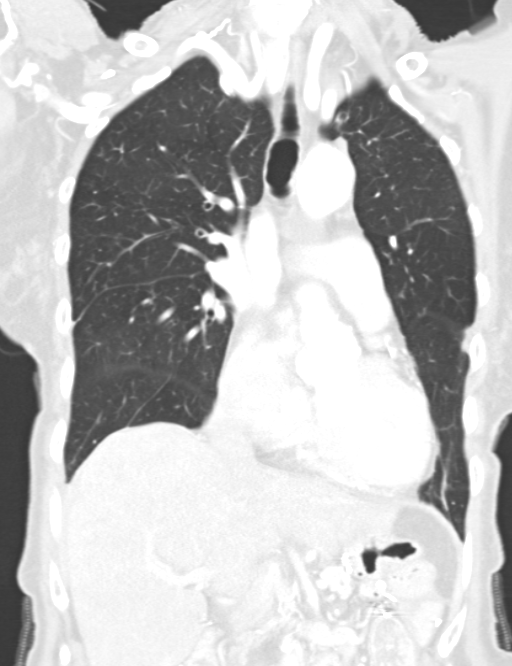
[im 38/64  lung]
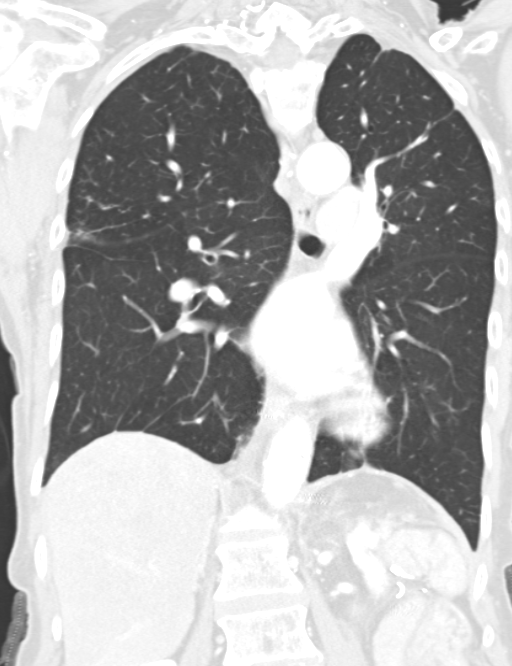

[Series 8: chestroutine 5.0 b40f · axial · 0.50mm/px · z∈[-262,+22]mm · 11 of 66 slices shown, 14 images]
[im 6/66  mediastinal]
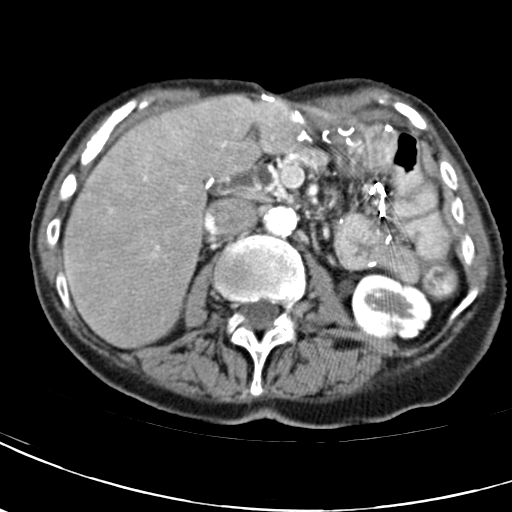
[im 6/66  lung]
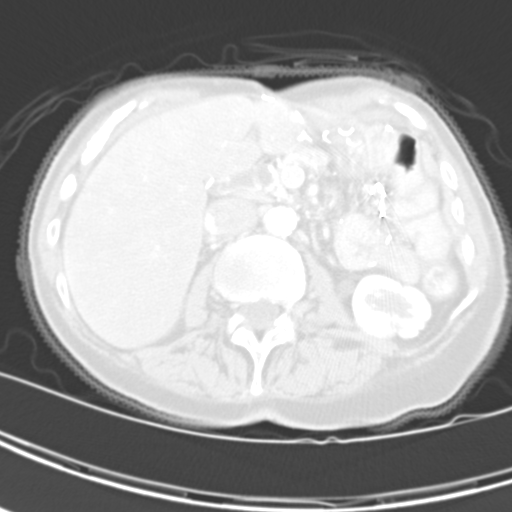
[im 12/66  lung]
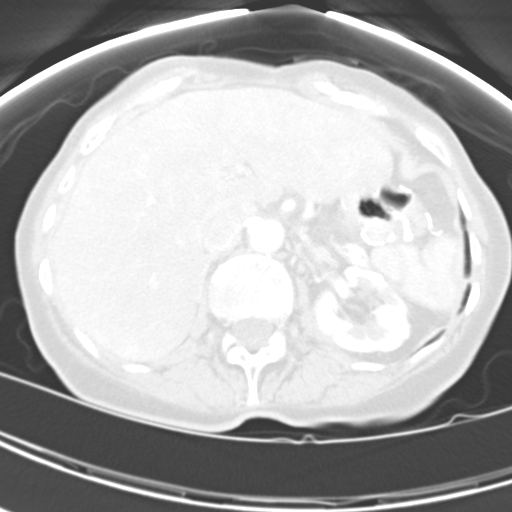
[im 17/66  lung]
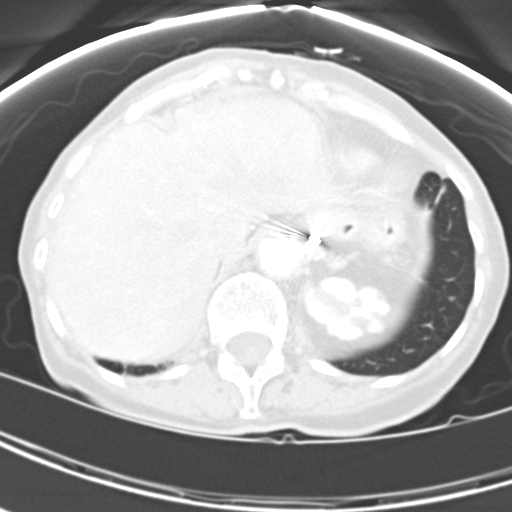
[im 23/66  lung]
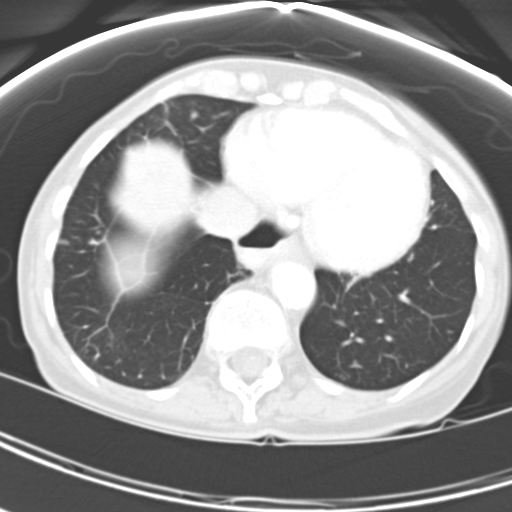
[im 29/66  mediastinal]
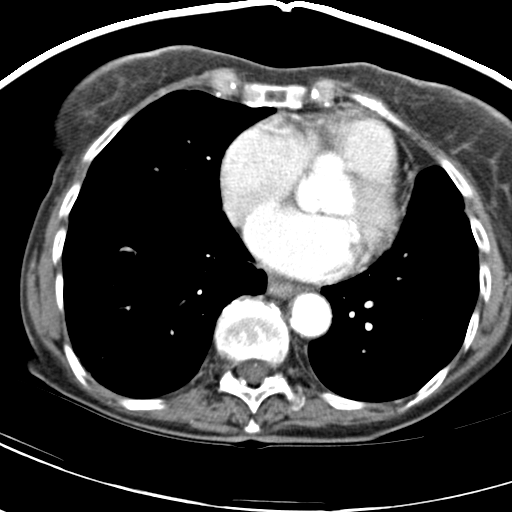
[im 29/66  lung]
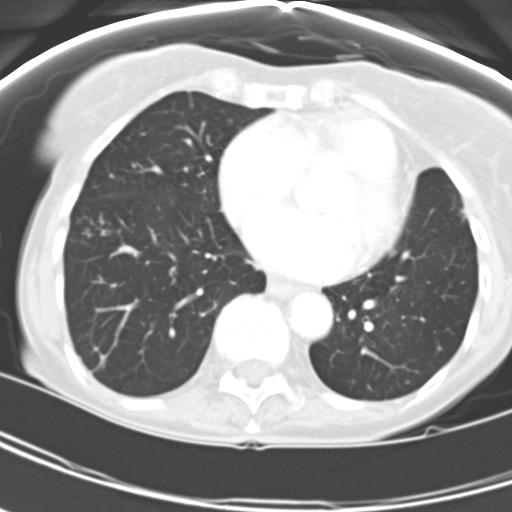
[im 34/66  lung]
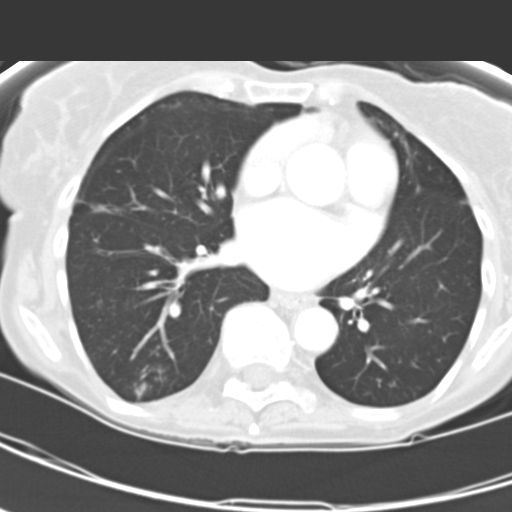
[im 40/66  lung]
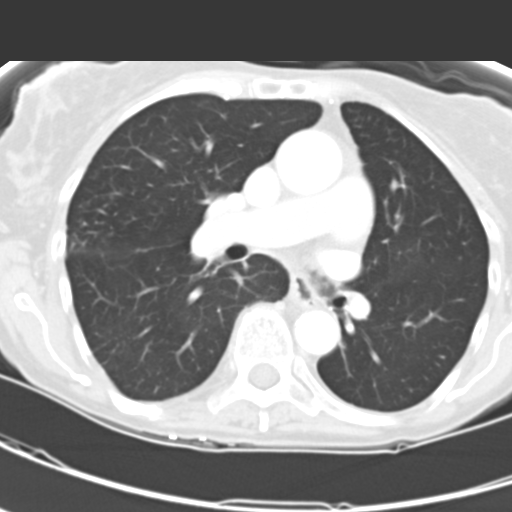
[im 46/66  lung]
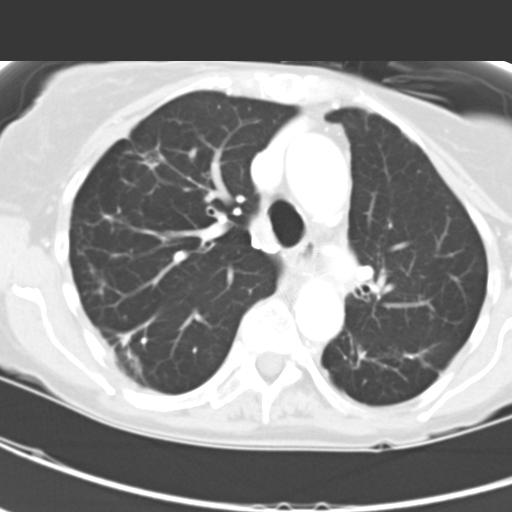
[im 51/66  mediastinal]
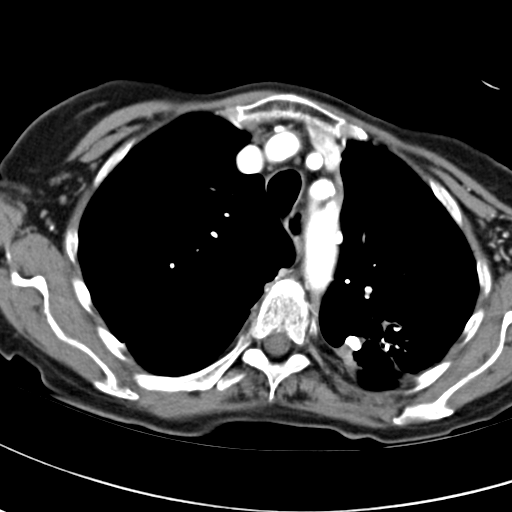
[im 51/66  lung]
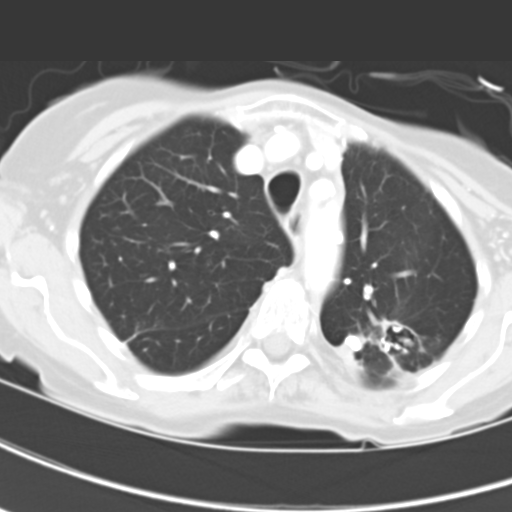
[im 57/66  lung]
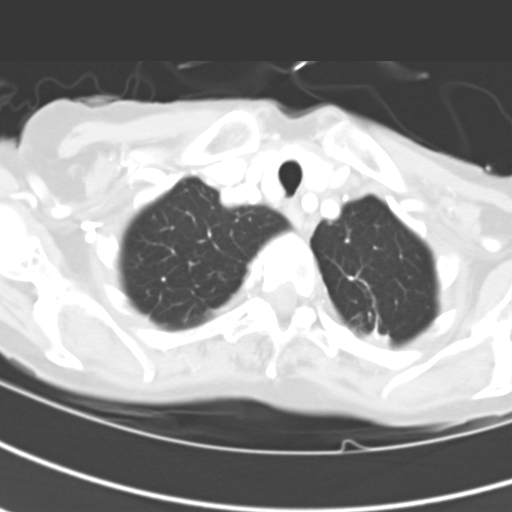
[im 63/66  lung]
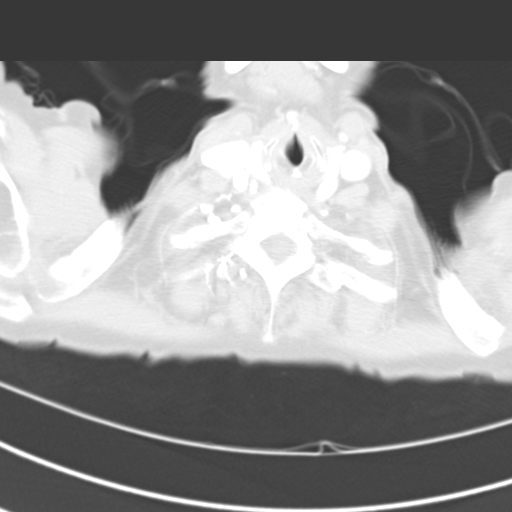

[14 of 36 positions shown; findings below may reference images not displayed]

FINDINGS: The aorta normal caliber without aneurysm or dissection.
Scattered atherosclerotic changes.
Pulmonary arteries grossly patent on non dedicated exam.
Few coronary arterial calcifications noted.
Small hiatal hernia.
No thoracic adenopathy.
Tiny of line 3 mm diameter left thyroid nodules stable.
Scarring upper pole left kidney.
Postsurgical changes at stomach, question of distal gastrectomy and
gastrojejunostomy.
Spleen surgically absent.

Cavitary focus with scarring calcification identified at left apex,
with associated pleural thickening, and suggesting prior
tuberculosis.
5 mm left upper lobe nodule image 14 with stable.
Minimal lingular scarring.
New tiny nodular density 4 mm diameter left lower lobe image 46.
The multiple foci of peripheral scarring in right upper lobe.
Stellate minimal density at the were superior segment of the lower
pole of the right lower lobe, abutting the major fissure, with the
measures approximately 1.6 x 1.4 cm image 23 and extending with
cm craniocaudal length.
No significant growth is noted, though margins remain stellate and
irregular.
New 4 mm diameter right lower lobe nodule posteriorly with minimal
surrounding infiltrate.
Interval improvement in "tree in bud" opacities in right lung since
previous exam.
No pleural effusion.
IMPRESSION: Question old tuberculosis of left upper lobe.
Scattered areas of parenchymal scarring and "tree in bud" opacities
in the right lung, question atypical mycobacterial infection or
bronchiolitis.
Stellate density 1.6 x 1.4 x 2.3 cm in size superior segment right
lower lobe, potentially postinflammatory as well, though there are
several new additional tiny nodular densities in both lungs which
are nonspecific.
The tiny nodules could potentially be additional areas of
developing scarring, though other etiologies including tumor are
not excluded.
While these tiny nodules are too small for PET characterization, it
may be of benefit to perform a PET CT to confirm the absence of
metabolic activity/tumor in the stellate right lung density in
order to exclude an indolent neoplasm.
If PET CT is not performed, then follow-up CT chest in 4-6 months
would be required to demonstrate continued stability of the
stellate right lung opacity.

## 2012-02-10 DIAGNOSIS — J019 Acute sinusitis, unspecified: Secondary | ICD-10-CM | POA: Diagnosis not present

## 2012-02-12 DIAGNOSIS — J019 Acute sinusitis, unspecified: Secondary | ICD-10-CM | POA: Diagnosis not present

## 2012-03-02 ENCOUNTER — Ambulatory Visit (HOSPITAL_COMMUNITY)
Admission: RE | Admit: 2012-03-02 | Discharge: 2012-03-02 | Disposition: A | Discharge status: Home / Self Care | Payer: Medicare Other | Source: Ambulatory Visit | Attending: Family Medicine | Admitting: Family Medicine

## 2012-03-02 ENCOUNTER — Other Ambulatory Visit: Payer: Self-pay | Admitting: Family Medicine

## 2012-03-02 DIAGNOSIS — J189 Pneumonia, unspecified organism: Secondary | ICD-10-CM | POA: Diagnosis not present

## 2012-03-02 DIAGNOSIS — J309 Allergic rhinitis, unspecified: Secondary | ICD-10-CM | POA: Diagnosis not present

## 2012-03-02 DIAGNOSIS — R059 Cough, unspecified: Secondary | ICD-10-CM | POA: Insufficient documentation

## 2012-03-02 DIAGNOSIS — R918 Other nonspecific abnormal finding of lung field: Secondary | ICD-10-CM | POA: Diagnosis not present

## 2012-03-02 DIAGNOSIS — I700 Atherosclerosis of aorta: Secondary | ICD-10-CM | POA: Diagnosis not present

## 2012-03-02 DIAGNOSIS — R05 Cough: Secondary | ICD-10-CM | POA: Insufficient documentation

## 2012-03-09 ENCOUNTER — Other Ambulatory Visit: Payer: Self-pay | Admitting: Family Medicine

## 2012-03-09 DIAGNOSIS — M79609 Pain in unspecified limb: Secondary | ICD-10-CM | POA: Diagnosis not present

## 2012-03-09 DIAGNOSIS — R7989 Other specified abnormal findings of blood chemistry: Secondary | ICD-10-CM

## 2012-03-09 DIAGNOSIS — M79661 Pain in right lower leg: Secondary | ICD-10-CM

## 2012-03-09 DIAGNOSIS — M25569 Pain in unspecified knee: Secondary | ICD-10-CM | POA: Diagnosis not present

## 2012-03-10 ENCOUNTER — Ambulatory Visit (HOSPITAL_COMMUNITY)
Admission: RE | Admit: 2012-03-10 | Discharge: 2012-03-10 | Disposition: A | Discharge status: Home / Self Care | Payer: Medicare Other | Source: Ambulatory Visit | Attending: Family Medicine | Admitting: Family Medicine

## 2012-03-10 DIAGNOSIS — M79609 Pain in unspecified limb: Secondary | ICD-10-CM | POA: Insufficient documentation

## 2012-03-10 DIAGNOSIS — M79661 Pain in right lower leg: Secondary | ICD-10-CM

## 2012-03-10 DIAGNOSIS — M79662 Pain in left lower leg: Secondary | ICD-10-CM

## 2012-03-10 DIAGNOSIS — R7989 Other specified abnormal findings of blood chemistry: Secondary | ICD-10-CM | POA: Diagnosis not present

## 2012-03-12 ENCOUNTER — Other Ambulatory Visit: Payer: Self-pay | Admitting: Family Medicine

## 2012-03-12 DIAGNOSIS — M79609 Pain in unspecified limb: Secondary | ICD-10-CM | POA: Diagnosis not present

## 2012-03-12 DIAGNOSIS — R7989 Other specified abnormal findings of blood chemistry: Secondary | ICD-10-CM

## 2012-03-12 DIAGNOSIS — M79606 Pain in leg, unspecified: Secondary | ICD-10-CM

## 2012-03-17 ENCOUNTER — Ambulatory Visit (HOSPITAL_COMMUNITY)
Admission: RE | Admit: 2012-03-17 | Discharge: 2012-03-17 | Disposition: A | Discharge status: Home / Self Care | Payer: Medicare Other | Source: Ambulatory Visit | Attending: Family Medicine | Admitting: Family Medicine

## 2012-03-17 DIAGNOSIS — M79609 Pain in unspecified limb: Secondary | ICD-10-CM | POA: Diagnosis not present

## 2012-03-17 DIAGNOSIS — R791 Abnormal coagulation profile: Secondary | ICD-10-CM | POA: Diagnosis not present

## 2012-03-17 DIAGNOSIS — M79606 Pain in leg, unspecified: Secondary | ICD-10-CM

## 2012-03-17 DIAGNOSIS — R7989 Other specified abnormal findings of blood chemistry: Secondary | ICD-10-CM

## 2012-04-07 DIAGNOSIS — R5381 Other malaise: Secondary | ICD-10-CM | POA: Diagnosis not present

## 2012-04-07 DIAGNOSIS — R110 Nausea: Secondary | ICD-10-CM | POA: Diagnosis not present

## 2012-04-07 DIAGNOSIS — Z79899 Other long term (current) drug therapy: Secondary | ICD-10-CM | POA: Diagnosis not present

## 2012-04-07 DIAGNOSIS — E785 Hyperlipidemia, unspecified: Secondary | ICD-10-CM | POA: Diagnosis not present

## 2012-04-07 DIAGNOSIS — R1084 Generalized abdominal pain: Secondary | ICD-10-CM | POA: Diagnosis not present

## 2012-04-07 DIAGNOSIS — R5383 Other fatigue: Secondary | ICD-10-CM | POA: Diagnosis not present

## 2012-04-07 DIAGNOSIS — E0590 Thyrotoxicosis, unspecified without thyrotoxic crisis or storm: Secondary | ICD-10-CM | POA: Diagnosis not present

## 2012-04-09 ENCOUNTER — Telehealth (INDEPENDENT_AMBULATORY_CARE_PROVIDER_SITE_OTHER): Payer: Self-pay | Admitting: *Deleted

## 2012-04-09 NOTE — Telephone Encounter (Signed)
[name] states that this past weekend she started having abdominal pain and it has continued this week. She is requesting something for Pain and for the Gas. She has been using TUMS and Phazyme. She uses [facility]. Her call back number is [phone]

## 2012-04-10 NOTE — Telephone Encounter (Signed)
Patient was called and made aware. Prescriptions were called to [facility]. They will deliver it today to the patient

## 2012-04-10 NOTE — Telephone Encounter (Signed)
.  She can try Cipro 500 mg by mouth twice a day for 10 day. Tylenol 5 500 twice a day when necessary;can call 20 doses

## 2012-05-05 ENCOUNTER — Other Ambulatory Visit: Payer: Self-pay | Admitting: Family Medicine

## 2012-05-05 ENCOUNTER — Ambulatory Visit (HOSPITAL_COMMUNITY)
Admission: RE | Admit: 2012-05-05 | Discharge: 2012-05-05 | Disposition: A | Discharge status: Home / Self Care | Payer: Medicare Other | Source: Ambulatory Visit | Attending: Family Medicine | Admitting: Family Medicine

## 2012-05-05 DIAGNOSIS — R634 Abnormal weight loss: Secondary | ICD-10-CM | POA: Diagnosis not present

## 2012-05-05 DIAGNOSIS — M4120 Other idiopathic scoliosis, site unspecified: Secondary | ICD-10-CM | POA: Diagnosis not present

## 2012-05-05 DIAGNOSIS — M5020 Other cervical disc displacement, unspecified cervical region: Secondary | ICD-10-CM | POA: Diagnosis not present

## 2012-05-05 DIAGNOSIS — M5030 Other cervical disc degeneration, unspecified cervical region: Secondary | ICD-10-CM | POA: Diagnosis not present

## 2012-05-05 DIAGNOSIS — M47814 Spondylosis without myelopathy or radiculopathy, thoracic region: Secondary | ICD-10-CM | POA: Diagnosis not present

## 2012-05-05 DIAGNOSIS — IMO0002 Reserved for concepts with insufficient information to code with codable children: Secondary | ICD-10-CM | POA: Diagnosis not present

## 2012-05-05 DIAGNOSIS — M549 Dorsalgia, unspecified: Secondary | ICD-10-CM

## 2012-05-05 DIAGNOSIS — D1430 Benign neoplasm of unspecified bronchus and lung: Secondary | ICD-10-CM | POA: Diagnosis not present

## 2012-05-05 DIAGNOSIS — M546 Pain in thoracic spine: Secondary | ICD-10-CM | POA: Diagnosis not present

## 2012-05-05 DIAGNOSIS — Z79899 Other long term (current) drug therapy: Secondary | ICD-10-CM | POA: Diagnosis not present

## 2012-05-11 ENCOUNTER — Ambulatory Visit (INDEPENDENT_AMBULATORY_CARE_PROVIDER_SITE_OTHER): Payer: Medicare Other | Admitting: Internal Medicine

## 2012-05-11 ENCOUNTER — Encounter (INDEPENDENT_AMBULATORY_CARE_PROVIDER_SITE_OTHER): Payer: Self-pay | Admitting: Internal Medicine

## 2012-05-11 VITALS — BP 128/72 | HR 74 | Temp 99.3°F | Resp 18 | Ht 59.0 in | Wt 85.9 lb

## 2012-05-11 DIAGNOSIS — R197 Diarrhea, unspecified: Secondary | ICD-10-CM

## 2012-05-11 DIAGNOSIS — R141 Gas pain: Secondary | ICD-10-CM | POA: Diagnosis not present

## 2012-05-11 DIAGNOSIS — R143 Flatulence: Secondary | ICD-10-CM

## 2012-05-11 DIAGNOSIS — R142 Eructation: Secondary | ICD-10-CM | POA: Diagnosis not present

## 2012-05-11 MED ORDER — LOPERAMIDE HCL 2 MG PO TABS: 2.0000 mg | ORAL_TABLET | Freq: Every day | ORAL | Status: DC

## 2012-05-11 NOTE — Progress Notes (Signed)
Presenting complaint;  Abdominal pain and flatulence.  Subjective:  Patient is 78 year old Caucasian female who is here for scheduled visit accompanied by her helper. One month ago she was called in prescription for Cipro for intractable flatulence. Previously she was treated with probiotic and Xifaxan but these medications provided no relief. She says her bloating is gradually coming back but nowhere near she was prior to taking antibiotics. She also has not experienced any pain in the last 2 weeks. She has pain when she's bloated. Pain is generally across upper abdomen. It is not associated with nausea vomiting or fever. She also complains of diarrhea with liquid stools. She has had few occasions there ileostomy bag ruptured or fell off resulting in mess. She also has had these episodes at night.  she denies bleeding or melena into ileostomy. She is not having rectal discharge. Her appetite is fair. She has lost 5 pounds since her last visit which is felt to be related to levothyroxine which was started every week ago.  Current Medications: Current Outpatient Prescriptions  Medication Sig Dispense Refill  . alendronate (FOSAMAX) 70 MG tablet Take 70 mg by mouth every 7 (seven) days. Take with a full glass of water on an empty stomach.       . bifidobacterium infantis (ALIGN) capsule Take 1 capsule by mouth daily.  30 capsule  1  . bismuth subsalicylate (PEPTO BISMOL) 262 MG chewable tablet Chew 524 mg by mouth as needed.        . diazepam (VALIUM) 5 MG tablet Take 5 mg by mouth every 6 (six) hours as needed.        . furosemide (LASIX) 20 MG tablet Take 20 mg by mouth as needed.       Marland Kitchen HYDROcodone-acetaminophen (VICODIN) 5-500 MG per tablet Take 1 tablet by mouth every 6 (six) hours as needed.        Marland Kitchen levothyroxine (SYNTHROID, LEVOTHROID) 25 MCG tablet Take 25 mcg by mouth daily.      . memantine (NAMENDA) 10 MG tablet Take 10 mg by mouth 2 (two) times daily.        . metoprolol (TOPROL-XL)  50 MG 24 hr tablet Take 50 mg by mouth daily.        . ondansetron (ZOFRAN) 4 MG tablet Take 4 mg by mouth every 8 (eight) hours as needed.        . Simethicone (PHAZYME PO) Take by mouth.       . venlafaxine (EFFEXOR) 75 MG tablet Take 75 mg by mouth. 3 pills a day         Objective: Blood pressure 128/72, pulse 74, temperature 99.3 F (37.4 C), temperature source Oral, resp. rate 18, height 4\' 11"  (1.499 m), weight 85 lb 14.4 oz (38.964 kg).  patient is alert and in no acute distress. Conjunctiva is pink. Sclera is nonicteric Oropharyngeal mucosa is normal. No neck masses or thyromegaly noted.. Abdomen.ileostomy bag in right lower quadrant has scant amount of gas in it. Bowel sounds are normal. Abdomen is soft and nontender without organomegaly or masses  No LE edema or clubbing noted.   Assessment:   #1. Excessive flatulence associated with abdominal pain. She responded to 10 day course of Cipro. She possibly has small bowel bacterial overgrowth and may need confirmation with hydrogen breath test. Will consider testing prior to next course of antibiotic therapy. #2. History of gastroparesis. She appears to be doing well with dietary measures and PPI.   Plan:  Imodium  OTC 2 mg by mouth every morning. Add Pepto-Bismol 2 tablets by mouth each bedtime after 2 weeks. Call with progress report in 4 weeks. Office visit in 6 months unless symptoms relapse.

## 2012-05-11 NOTE — Patient Instructions (Signed)
Take Imodium OTC on loperamide 2 mg by mouth every morning. Can take Pepto-Bismol 2 tablets by mouth daily at bedtime starting in 2 weeks

## 2012-05-25 ENCOUNTER — Ambulatory Visit (HOSPITAL_COMMUNITY)
Admission: RE | Admit: 2012-05-25 | Discharge: 2012-05-25 | Disposition: A | Discharge status: Home / Self Care | Payer: Medicare Other | Source: Ambulatory Visit | Attending: Family Medicine | Admitting: Family Medicine

## 2012-05-25 DIAGNOSIS — M549 Dorsalgia, unspecified: Secondary | ICD-10-CM | POA: Diagnosis not present

## 2012-05-25 DIAGNOSIS — J984 Other disorders of lung: Secondary | ICD-10-CM | POA: Diagnosis not present

## 2012-05-25 DIAGNOSIS — R918 Other nonspecific abnormal finding of lung field: Secondary | ICD-10-CM | POA: Diagnosis not present

## 2012-05-25 DIAGNOSIS — J8410 Pulmonary fibrosis, unspecified: Secondary | ICD-10-CM | POA: Diagnosis not present

## 2012-05-25 MED ORDER — IOHEXOL 300 MG/ML  SOLN
80.0000 mL | Freq: Once | INTRAMUSCULAR | Status: AC | PRN
  Administered 2012-05-25: 80 mL via INTRAVENOUS

## 2012-05-28 ENCOUNTER — Encounter (INDEPENDENT_AMBULATORY_CARE_PROVIDER_SITE_OTHER): Payer: Self-pay

## 2012-06-03 DIAGNOSIS — E039 Hypothyroidism, unspecified: Secondary | ICD-10-CM | POA: Diagnosis not present

## 2012-06-03 DIAGNOSIS — R5383 Other fatigue: Secondary | ICD-10-CM | POA: Diagnosis not present

## 2012-06-03 DIAGNOSIS — D649 Anemia, unspecified: Secondary | ICD-10-CM | POA: Diagnosis not present

## 2012-06-22 DIAGNOSIS — J449 Chronic obstructive pulmonary disease, unspecified: Secondary | ICD-10-CM | POA: Diagnosis not present

## 2012-06-22 DIAGNOSIS — K279 Peptic ulcer, site unspecified, unspecified as acute or chronic, without hemorrhage or perforation: Secondary | ICD-10-CM | POA: Diagnosis not present

## 2012-06-22 DIAGNOSIS — R911 Solitary pulmonary nodule: Secondary | ICD-10-CM | POA: Diagnosis not present

## 2012-07-28 DIAGNOSIS — E039 Hypothyroidism, unspecified: Secondary | ICD-10-CM | POA: Diagnosis not present

## 2012-08-24 DIAGNOSIS — H01009 Unspecified blepharitis unspecified eye, unspecified eyelid: Secondary | ICD-10-CM | POA: Diagnosis not present

## 2012-08-24 DIAGNOSIS — Z961 Presence of intraocular lens: Secondary | ICD-10-CM | POA: Diagnosis not present

## 2012-08-24 DIAGNOSIS — H40019 Open angle with borderline findings, low risk, unspecified eye: Secondary | ICD-10-CM | POA: Diagnosis not present

## 2012-09-21 ENCOUNTER — Encounter (INDEPENDENT_AMBULATORY_CARE_PROVIDER_SITE_OTHER): Payer: Self-pay | Admitting: Internal Medicine

## 2012-09-21 ENCOUNTER — Ambulatory Visit (INDEPENDENT_AMBULATORY_CARE_PROVIDER_SITE_OTHER): Payer: Medicare Other | Admitting: Internal Medicine

## 2012-09-21 VITALS — BP 130/70 | HR 72 | Temp 98.2°F | Resp 18 | Ht 59.0 in | Wt 83.7 lb

## 2012-09-21 DIAGNOSIS — K6389 Other specified diseases of intestine: Secondary | ICD-10-CM

## 2012-09-21 DIAGNOSIS — R634 Abnormal weight loss: Secondary | ICD-10-CM

## 2012-09-21 DIAGNOSIS — K3184 Gastroparesis: Secondary | ICD-10-CM | POA: Diagnosis not present

## 2012-09-21 MED ORDER — RIFAXIMIN 550 MG PO TABS: 550.0000 mg | ORAL_TABLET | Freq: Two times a day (BID) | ORAL | Status: DC

## 2012-09-21 NOTE — Patient Instructions (Addendum)
Take Xifaxan as discussed. Call office when you are able to obtain domperidone.

## 2012-09-21 NOTE — Progress Notes (Signed)
Presenting complaint;  Postprandial fullness and weight loss.  Subjective:  Patient is 79 year old Caucasian female who is here for scheduled visit. She has lost another couple of pounds since her last visit in October 2013. All in all she has lost 7 pounds in the last 10 months. She complains of early satiety and gets filled up very quickly. She feels nauseated. She had emesis about 3 weeks ago. She has intermittent heartburn with certain foods but she denies dysphagia. She also complains of excessive flatulence. At times she passes excessive amount of foul-smelling gas. She denies melena or bleeding into ileostomy. She denies abdominal pain fever or chills. She is using Pepto-Bismol 2-3 times a week which seems to help.  Current Medications: Current Outpatient Prescriptions  Medication Sig Dispense Refill  . alendronate (FOSAMAX) 70 MG tablet Take 70 mg by mouth every 7 (seven) days. Take with a full glass of water on an empty stomach.       . bifidobacterium infantis (ALIGN) capsule Take 1 capsule by mouth daily.  30 capsule  1  . bismuth subsalicylate (PEPTO BISMOL) 262 MG chewable tablet Chew 524 mg by mouth as needed.        . diazepam (VALIUM) 5 MG tablet Take 5 mg by mouth every 6 (six) hours as needed.        . furosemide (LASIX) 20 MG tablet Take 20 mg by mouth as needed.       Marland Kitchen HYDROcodone-acetaminophen (VICODIN) 5-500 MG per tablet Take 1 tablet by mouth every 6 (six) hours as needed.        Marland Kitchen levothyroxine (SYNTHROID, LEVOTHROID) 25 MCG tablet Take 25 mcg by mouth daily.      . memantine (NAMENDA) 10 MG tablet Take 10 mg by mouth 2 (two) times daily.        . metoprolol (TOPROL-XL) 50 MG 24 hr tablet Take 50 mg by mouth daily.        . ondansetron (ZOFRAN) 4 MG tablet Take 4 mg by mouth every 8 (eight) hours as needed.        . Simethicone (PHAZYME PO) Take by mouth.       . loperamide (IMODIUM A-D) 2 MG tablet Take 1 tablet (2 mg total) by mouth daily.  30 tablet  0   No  current facility-administered medications for this visit.     Objective: Blood pressure 130/70, pulse 72, temperature 98.2 F (36.8 C), temperature source Oral, resp. rate 18, height 4\' 11"  (1.499 m), weight 83 lb 11.2 oz (37.966 kg). Patient is alert and in no acute distress. Conjunctiva is pink. Sclera is nonicteric Oropharyngeal mucosa is normal. No neck masses or thyromegaly noted. Cardiac exam with regular rhythm normal S1 and S2. No murmur or gallop noted. Lungs are clear to auscultation. Abdomen is flat. Ileostomy bag as small amount of gas. Bowel sounds are normal. No bruits noted. Abdomen is soft and nontender without organomegaly or masses. No LE edema or clubbing noted.  Assessment:   #1 Early satiety and postprandial fullness appears to be secondary to gastroparesis which she has history of and had been on domperidone until it was no more available locally. I would not recommend metoclopramide because of significant potential for side effects. #2. Weight loss secondary to diminished oral intake. #3. Excessive flatulence. Suspect she may have small bowel bacterial overgrowth. In the past she has responded to antibiotic therapy.   Plan:  Xifaxan 550 mg by mouth twice a day for 2 weeks. She  will go back on domperidone at 10 mg by mouth twice a day if [place]. Office visit in 2 months.

## 2012-10-05 ENCOUNTER — Telehealth (INDEPENDENT_AMBULATORY_CARE_PROVIDER_SITE_OTHER): Payer: Self-pay | Admitting: *Deleted

## 2012-10-05 NOTE — Telephone Encounter (Signed)
Patient rec'd Domperidone on Saturday. Per Dr.Rehman the patient is to take 1 tablet by mouth twice a day. I advised the patient of this

## 2012-10-05 NOTE — Telephone Encounter (Signed)
LM stating she received the medication that was ordered on Monday, 10/05/12 at 1:00 pm. Tried to call patient back to get the name of medicine, line Bz 3 times. The return phone number is [phone].

## 2012-10-23 ENCOUNTER — Other Ambulatory Visit (HOSPITAL_COMMUNITY): Payer: Self-pay | Admitting: Family Medicine

## 2012-11-09 ENCOUNTER — Ambulatory Visit (INDEPENDENT_AMBULATORY_CARE_PROVIDER_SITE_OTHER): Payer: Medicare Other | Admitting: Internal Medicine

## 2012-11-09 ENCOUNTER — Telehealth (INDEPENDENT_AMBULATORY_CARE_PROVIDER_SITE_OTHER): Payer: Self-pay | Admitting: *Deleted

## 2012-11-09 NOTE — Telephone Encounter (Addendum)
Cancelled today's apt. Asked [name] to call her back. She has a bloody nose and needs to know what to do. The return phone number is [phone].

## 2012-11-11 ENCOUNTER — Ambulatory Visit (INDEPENDENT_AMBULATORY_CARE_PROVIDER_SITE_OTHER): Payer: Medicare Other | Admitting: Family Medicine

## 2012-11-11 ENCOUNTER — Encounter: Payer: Self-pay | Admitting: Family Medicine

## 2012-11-11 VITALS — BP 102/60 | Temp 98.0°F | Ht 59.0 in | Wt 82.2 lb

## 2012-11-11 DIAGNOSIS — Z79899 Other long term (current) drug therapy: Secondary | ICD-10-CM | POA: Diagnosis not present

## 2012-11-11 DIAGNOSIS — R040 Epistaxis: Secondary | ICD-10-CM | POA: Diagnosis not present

## 2012-11-11 DIAGNOSIS — R9389 Abnormal findings on diagnostic imaging of other specified body structures: Secondary | ICD-10-CM

## 2012-11-11 DIAGNOSIS — N289 Disorder of kidney and ureter, unspecified: Secondary | ICD-10-CM

## 2012-11-11 DIAGNOSIS — R51 Headache: Secondary | ICD-10-CM

## 2012-11-11 DIAGNOSIS — G8929 Other chronic pain: Secondary | ICD-10-CM | POA: Insufficient documentation

## 2012-11-11 DIAGNOSIS — D62 Acute posthemorrhagic anemia: Secondary | ICD-10-CM

## 2012-11-11 NOTE — Progress Notes (Signed)
  Subjective:    Patient ID: [name], female    DOB: 1929/02/16, 79 y.o.   MRN: [id number]  Epistaxis  The bleeding has been from the left nare. This is a new problem. The current episode started in the past 7 days. The problem has been resolved. The bleeding is associated with nothing. She has tried nothing for the symptoms. The treatment provided significant relief.  patient relates a nose bleed was very severe was bad enough to where she called EMS when EMS came they were able to stop the bleeding. She describes it as left-sided nasal bleeding she denied problems with it before. No shortness of breath nausea vomiting or dizziness associated with it.  She also relates intermittent headaches although they're not severe they do not wake her up for the middle of her sleep. No nausea or vomiting with it.  Occasional intermittent abdominal discomfort although not severe her colostomy bag is working fine no vomiting with it her aide states that she eats breakfast well but does not eat as much throughout the course of the day but she does stay active. She has lost a little bit await and denies any blood in the stool. She has a history of multiple different abdominal surgery adhesions colostomy bag and resection. His been difficult for her to hold her weight recently. She has chronic muscle discomfort and headaches. Is on numerous medications. Family history social history noncontributory.    Review of Systems  HENT: Positive for nosebleeds.   she denies chest tightness pressure pain and swelling in the legs to mind denies hematochezia hematuria. No vomiting. She does need a aide at home to help her with her physical needs.  She had a CT scan of the chest a while back which shows an area that was felt to be stable but she will try need repeat CT scan in July.    Objective:   Physical Exam Vital signs stable, lungs are clear no crackles heart is regular. Pulses normal abdomen is soft with  colostomy bag extremities no edema       Assessment & Plan:  #1 headache-functional. I don't recommend further pain medications or other measures  #2 intermittent abdominal discomfort probably related to gastroparesis no sign of blockages encourage patient if having any bloody stools or diarrhea to followup  #3 epistaxis-saline spray as and followup if progressive troubles. Referral to ENT for evaluation  #4 weight loss-encouraged patient to do a good job of eating. Recheck her in one month.  Repeat CT scan of the chest in July. Sooner if any problems.

## 2012-11-11 NOTE — Patient Instructions (Signed)
Saline nasal spray twice a day  If severe bleed- pinch for 5 minutes -if still bleeding then go to er  Follow up in 1 month.  We will set a appointment in with ENT-Dr.Teoh

## 2012-11-11 NOTE — Telephone Encounter (Signed)
I have talked with [name]. On Sunday she had a horrible nose bleed and EMS was called. [name] states that she has been having terrible headaches , along with sinus trouble. EMS got the nose bleed to stop and told her that they could not see anything in her nose that would have caused the bleed. She was to weak to keep her appointment with Dr.Rehman this week but will make another appointment soon. Patient was advised to call PCP to be further evaluated per Dr.Rehman. I ask that she call me and let me know about her appointment.

## 2012-11-12 ENCOUNTER — Encounter: Payer: Self-pay | Admitting: Family Medicine

## 2012-11-12 LAB — HEPATIC FUNCTION PANEL
AST: 26 U/L (ref 0–37)
Albumin: 4 g/dL (ref 3.5–5.2)
Alkaline Phosphatase: 73 U/L (ref 39–117)
Total Bilirubin: 0.4 mg/dL (ref 0.3–1.2)

## 2012-11-12 LAB — CBC WITH DIFFERENTIAL/PLATELET
Basophils Absolute: 0.1 10*3/uL (ref 0.0–0.1)
HCT: 42.5 % (ref 36.0–46.0)
Hemoglobin: 14 g/dL (ref 12.0–15.0)
Lymphocytes Relative: 43 % (ref 12–46)
Lymphs Abs: 3.7 10*3/uL (ref 0.7–4.0)
Monocytes Absolute: 0.9 10*3/uL (ref 0.1–1.0)
Monocytes Relative: 11 % (ref 3–12)
Neutro Abs: 3.8 10*3/uL (ref 1.7–7.7)
RBC: 4.24 MIL/uL (ref 3.87–5.11)
WBC: 8.7 10*3/uL (ref 4.0–10.5)

## 2012-11-12 LAB — BASIC METABOLIC PANEL
BUN: 14 mg/dL (ref 6–23)
Chloride: 104 mEq/L (ref 96–112)
Glucose, Bld: 86 mg/dL (ref 70–99)
Potassium: 4.9 mEq/L (ref 3.5–5.3)

## 2012-11-13 ENCOUNTER — Encounter: Payer: Self-pay | Admitting: *Deleted

## 2012-11-18 ENCOUNTER — Encounter: Payer: Medicare Other | Admitting: Family Medicine

## 2012-11-18 ENCOUNTER — Telehealth: Payer: Self-pay | Admitting: Family Medicine

## 2012-11-18 ENCOUNTER — Other Ambulatory Visit: Payer: Self-pay | Admitting: Family Medicine

## 2012-11-18 DIAGNOSIS — R040 Epistaxis: Secondary | ICD-10-CM

## 2012-11-18 NOTE — Telephone Encounter (Signed)
We will go ahead with referral based on nosebleeds. It is fine to be with Dr. Seward Meth

## 2012-11-18 NOTE — Telephone Encounter (Signed)
Pt prefers afternoons but not Thursdays

## 2012-11-18 NOTE — Telephone Encounter (Signed)
Patient states we were supposed to refer her to ENT (Dr. Suszanne Conners), never received referral in my workqueue, please initiate in the system so that I may proceed

## 2012-11-18 NOTE — Progress Notes (Signed)
This encounter was created in error - please disregard.

## 2012-11-20 ENCOUNTER — Other Ambulatory Visit: Payer: Self-pay | Admitting: Family Medicine

## 2012-11-23 ENCOUNTER — Ambulatory Visit (INDEPENDENT_AMBULATORY_CARE_PROVIDER_SITE_OTHER): Payer: Medicare Other | Admitting: Internal Medicine

## 2012-11-24 ENCOUNTER — Other Ambulatory Visit: Payer: Self-pay | Admitting: Family Medicine

## 2012-12-03 ENCOUNTER — Ambulatory Visit (INDEPENDENT_AMBULATORY_CARE_PROVIDER_SITE_OTHER): Payer: Medicare Other | Admitting: Otolaryngology

## 2012-12-03 DIAGNOSIS — R040 Epistaxis: Secondary | ICD-10-CM

## 2012-12-08 ENCOUNTER — Encounter (INDEPENDENT_AMBULATORY_CARE_PROVIDER_SITE_OTHER): Payer: Self-pay | Admitting: Internal Medicine

## 2012-12-08 ENCOUNTER — Ambulatory Visit (INDEPENDENT_AMBULATORY_CARE_PROVIDER_SITE_OTHER): Payer: Medicare Other | Admitting: Internal Medicine

## 2012-12-08 VITALS — BP 100/70 | HR 42 | Ht <= 58 in | Wt 81.9 lb

## 2012-12-08 DIAGNOSIS — R634 Abnormal weight loss: Secondary | ICD-10-CM

## 2012-12-08 DIAGNOSIS — K3184 Gastroparesis: Secondary | ICD-10-CM

## 2012-12-08 NOTE — Progress Notes (Signed)
Subjective:     Patient ID: [name], female   DOB: Jun 18, 1929, 80 y.o.   MRN: [id number]  HPI Here today for a scheduled visit. Last seen in march for weight loss and early satiety. Hx of gastroparesis. She has lost one pain since her last  Visit.  She c/o a lot of gas. Her appetite is good. Caregiver says she does not eat a lot at one meal.  She has early satiety. She does have some nausea.  There has been no vomiting. She has occasionally acid reflux. She is not taking the Protonix for this.  When she drinks she sometimes chokes.  She has a lot of flatus and foul smelling. She has a colostomy. No melena or bleeding into her ileostomy.  She does use Pepto Bismol 2-3 times a week.  She  Presently taking Domperidone 10mg  once a day.  She is eating 3 meals a day and an occasionally snack.       :    Review of Systems see hpi Current Outpatient Prescriptions  Medication Sig Dispense Refill  . alendronate (FOSAMAX) 70 MG tablet Take 70 mg by mouth every 7 (seven) days. Take with a full glass of water on an empty stomach.       . bismuth subsalicylate (PEPTO BISMOL) 262 MG chewable tablet Chew 524 mg by mouth as needed.        . diazepam (VALIUM) 5 MG tablet Take 5 mg by mouth every 6 (six) hours as needed.        . furosemide (LASIX) 20 MG tablet Take 20 mg by mouth as needed.       Marland Kitchen HYDROcodone-acetaminophen (VICODIN) 5-500 MG per tablet Take 1 tablet by mouth every 6 (six) hours as needed.        Marland Kitchen levothyroxine (SYNTHROID, LEVOTHROID) 25 MCG tablet TAKE (1) TABLET BY MOUTH ONCE DAILY.  30 tablet  3  . metoprolol (TOPROL-XL) 50 MG 24 hr tablet Take 50 mg by mouth daily.        Marland Kitchen NAMENDA 10 MG tablet TAKE 1 TABLET TWICE A DAY FOR DEMENTIA.  60 tablet  5  . ondansetron (ZOFRAN) 4 MG tablet Take 4 mg by mouth every 8 (eight) hours as needed.        . ondansetron (ZOFRAN) 8 MG tablet TAKE 1/2 TABLET THREE TIMES DAILY AS NEEDED FOR NAUSEA  12 tablet  0  . venlafaxine XR (EFFEXOR-XR) 150 MG 24  hr capsule TAKE 1 CAPSULE ONCE DAILY FOR DEPRESSION.  30 capsule  5  . loperamide (IMODIUM A-D) 2 MG tablet Take 1 tablet (2 mg total) by mouth daily.  30 tablet  0  . rifaximin (XIFAXAN) 550 MG TABS Take 1 tablet (550 mg total) by mouth 2 (two) times daily.  28 tablet  0   No current facility-administered medications for this visit.   Past Medical History  Diagnosis Date  . Lower abdominal pain   . Gastroesophageal reflux disease   . IBS (irritable bowel syndrome)   . Migraines   . COPD (chronic obstructive pulmonary disease)   . GAD (generalized anxiety disorder)   . Anemia   . Osteoporosis   . Renal insufficiency   . Tachycardia    Past Surgical History  Procedure Laterality Date  . Stomach surgery for a bleeding ulcer when she was in her 30s    . Gastrectomy    . Tonsillectomy    . Appendectomy    . Cholecystectomy    .  Abdominal hysterectomy    . Esophagogastroduodenoscopy     Allergies  Allergen Reactions  . Tetanus Toxoid   . Zolpidem Tartrate         Objective:   Physical Exam  Filed Vitals:   12/08/12 1448  BP: 100/70  Pulse: 42  Height: 4\' 7"  (1.397 m)  Weight: 81 lb 14.4 oz (37.15 kg)  Alert and oriented. Skin warm and dry. Oral mucosa is moist.   . Sclera anicteric, conjunctivae is pink. Thyroid not enlarged. No cervical lymphadenopathy. Lungs clear. Heart regular rate and rhythm.  Abdomen is soft. Bowel sounds are positive. No hepatomegaly. No abdominal masses felt. No tenderness. Abdomen is thin.  No edema to lower extremities.        Assessment:     Gastroparesis: Presently taking Domperidone 10mg  daily.    Weight loss: She is eating 3 meals a day but in small amts. Plan:     Domperidone 10mg  BID.  Continue to eat 3 meals a day and snack in between meals.

## 2012-12-08 NOTE — Patient Instructions (Addendum)
Domperidone 10mg  twice a day. OV in 3 months.

## 2012-12-09 ENCOUNTER — Ambulatory Visit: Payer: Medicare Other | Admitting: Family Medicine

## 2012-12-14 ENCOUNTER — Other Ambulatory Visit: Payer: Self-pay | Admitting: Family Medicine

## 2012-12-15 ENCOUNTER — Encounter: Payer: Self-pay | Admitting: Family Medicine

## 2012-12-15 ENCOUNTER — Ambulatory Visit (INDEPENDENT_AMBULATORY_CARE_PROVIDER_SITE_OTHER): Payer: Medicare Other | Admitting: Family Medicine

## 2012-12-15 ENCOUNTER — Other Ambulatory Visit: Payer: Self-pay

## 2012-12-15 VITALS — BP 112/80 | HR 60 | Ht 58.75 in | Wt 82.4 lb

## 2012-12-15 DIAGNOSIS — Z79899 Other long term (current) drug therapy: Secondary | ICD-10-CM | POA: Diagnosis not present

## 2012-12-15 DIAGNOSIS — R9389 Abnormal findings on diagnostic imaging of other specified body structures: Secondary | ICD-10-CM | POA: Diagnosis not present

## 2012-12-15 DIAGNOSIS — R51 Headache: Secondary | ICD-10-CM | POA: Diagnosis not present

## 2012-12-15 DIAGNOSIS — R634 Abnormal weight loss: Secondary | ICD-10-CM

## 2012-12-15 LAB — BASIC METABOLIC PANEL
CO2: 22 mEq/L (ref 19–32)
Calcium: 9.2 mg/dL (ref 8.4–10.5)
Creat: 1.37 mg/dL — ABNORMAL HIGH (ref 0.50–1.10)
Glucose, Bld: 55 mg/dL — ABNORMAL LOW (ref 70–99)

## 2012-12-15 MED ORDER — HYDROCODONE-ACETAMINOPHEN 5-325 MG PO TABS: 1.0000 | ORAL_TABLET | ORAL | Status: DC | PRN

## 2012-12-15 NOTE — Progress Notes (Signed)
  Subjective:    Patient ID: [name], female    DOB: Nov 17, 1928, 79 y.o.   MRN: [id number]  Epistaxis  This is a new problem. The current episode started more than 1 month ago. The problem has been resolved. The bleeding is associated with nothing. The treatment provided significant relief. Her past medical history is significant for sinus problems.   Patient is in today in order to have her note from the ENT were reviewed. She states she has not had any nosebleeds since been seen there. She's not had to have any procedures done. She feels very good about. In addition to this she also relates needs refills on her pain medications. She denies abusing it and states it helps she also states she is trying her best to keep her weight up and trying eat properly. She denies any complications recently. PMH noted [facility] noted social doesn't smoke  Review of Systems  HENT: Positive for nosebleeds.        Objective:   Physical Exam Lungs are clear, heart regular, abdomen soft extremities no edema       Assessment & Plan:  Chronic back pain use Vicodin sparingly cautioned drowsiness Epistaxes resolved. Use Vaseline at night I encourage her to try to gain up to 85 pounds total. She ought to be seen again in 3 months time. Finally CT exam of the chest was ordered she is due for this to reevaluate the nodule that was present on previous CT 6 months ago

## 2012-12-16 ENCOUNTER — Encounter: Payer: Self-pay | Admitting: Family Medicine

## 2012-12-21 ENCOUNTER — Telehealth (INDEPENDENT_AMBULATORY_CARE_PROVIDER_SITE_OTHER): Payer: Self-pay | Admitting: *Deleted

## 2012-12-21 NOTE — Telephone Encounter (Signed)
Patient called asking to speak with [name]. She is needing more samples of Domperidone 10 mg. Her return phone number is [phone].

## 2012-12-22 NOTE — Telephone Encounter (Signed)
I have called the patient's number several times and the phone line is busy,busy. I called Bryson Dames , former daughter in law , who is the POA. I made her aware of the times I had tried to reach the patient, she felt that patient's husband had been out on tractor and cut the phone line again. She ask that we mail to her the information about the Domperidone and she would take care of it. She was going to send her husband to the patient's house to check the phone line.

## 2012-12-22 NOTE — Telephone Encounter (Signed)
Patient called back 3 times and would like to speak with [name]. Mady was adivsed that the message was given to [name]. Daughter, Teodora Medici POA, was not aware her mother has called so many times. Her mother has a lot of issues going on. Please ask [name] to please return her mother's call at [phone]. Susan's return phone number is needed is [phone] or [phone].

## 2012-12-23 ENCOUNTER — Ambulatory Visit (HOSPITAL_COMMUNITY)
Admission: RE | Admit: 2012-12-23 | Discharge: 2012-12-23 | Disposition: A | Discharge status: Home / Self Care | Payer: Medicare Other | Source: Ambulatory Visit | Attending: Family Medicine | Admitting: Family Medicine

## 2012-12-23 DIAGNOSIS — R091 Pleurisy: Secondary | ICD-10-CM | POA: Diagnosis not present

## 2012-12-23 DIAGNOSIS — R918 Other nonspecific abnormal finding of lung field: Secondary | ICD-10-CM | POA: Diagnosis not present

## 2012-12-23 DIAGNOSIS — R222 Localized swelling, mass and lump, trunk: Secondary | ICD-10-CM | POA: Diagnosis not present

## 2012-12-23 MED ORDER — IOHEXOL 300 MG/ML  SOLN
80.0000 mL | Freq: Once | INTRAMUSCULAR | Status: AC | PRN
  Administered 2012-12-23: 64 mL via INTRAVENOUS

## 2013-01-01 ENCOUNTER — Other Ambulatory Visit: Payer: Self-pay | Admitting: Family Medicine

## 2013-01-25 ENCOUNTER — Other Ambulatory Visit: Payer: Self-pay | Admitting: Family Medicine

## 2013-02-01 ENCOUNTER — Other Ambulatory Visit: Payer: Self-pay | Admitting: *Deleted

## 2013-02-01 ENCOUNTER — Telehealth (INDEPENDENT_AMBULATORY_CARE_PROVIDER_SITE_OTHER): Payer: Self-pay | Admitting: *Deleted

## 2013-02-01 MED ORDER — HYDROCODONE-ACETAMINOPHEN 5-325 MG PO TABS: 1.0000 | ORAL_TABLET | ORAL | Status: DC | PRN

## 2013-02-01 NOTE — Telephone Encounter (Signed)
[name] would like to know how many times daily should she to take the Donperdone 10 mg. Per Charissa Bash, NP, take it twice daily and do a call report in 2 weeks.

## 2013-02-02 ENCOUNTER — Other Ambulatory Visit: Payer: Self-pay | Admitting: Family Medicine

## 2013-02-22 ENCOUNTER — Other Ambulatory Visit: Payer: Self-pay | Admitting: Family Medicine

## 2013-02-23 DIAGNOSIS — E119 Type 2 diabetes mellitus without complications: Secondary | ICD-10-CM | POA: Diagnosis not present

## 2013-02-23 DIAGNOSIS — Z961 Presence of intraocular lens: Secondary | ICD-10-CM | POA: Diagnosis not present

## 2013-03-08 ENCOUNTER — Encounter (INDEPENDENT_AMBULATORY_CARE_PROVIDER_SITE_OTHER): Payer: Self-pay | Admitting: Internal Medicine

## 2013-03-08 ENCOUNTER — Ambulatory Visit (INDEPENDENT_AMBULATORY_CARE_PROVIDER_SITE_OTHER): Payer: Medicare Other | Admitting: Internal Medicine

## 2013-03-08 VITALS — BP 128/70 | HR 68 | Temp 98.0°F | Resp 18 | Ht 59.0 in | Wt 86.5 lb

## 2013-03-08 DIAGNOSIS — K3184 Gastroparesis: Secondary | ICD-10-CM | POA: Diagnosis not present

## 2013-03-08 DIAGNOSIS — K6389 Other specified diseases of intestine: Secondary | ICD-10-CM | POA: Diagnosis not present

## 2013-03-08 NOTE — Patient Instructions (Signed)
Call if bloating becomes intractable.

## 2013-03-08 NOTE — Progress Notes (Signed)
Presenting complaint;  Followup for bloating and gastroparesis.  Subjective:  Patient is 79 year old Caucasian female who presents for scheduled visit. She was last seen on 09/21/2012 for excessive flatulence early satiety and postprandial fullness. She was given 2 week course of Xifaxan and asked to go back on domperidone for gastroparesis. She felt a lot better with antibiotic but now having intermittent bloating. She states Pepto-Bismol tablets seem to help. Her appetite has improved. She has multiple snacks rather than regular meals. She denies nausea vomiting heartburn. She also denies bleeding or melena into ileostomy bag.  Current Medications: Current Outpatient Prescriptions  Medication Sig Dispense Refill  . alendronate (FOSAMAX) 70 MG tablet TAKE 1 TAB WEEKLY 30 MINS. BEFORE BREAKFAST WITH 8 OZ OF WATER FOR OSTEOPOROSIS.  4 tablet  5  . benzonatate (TESSALON) 100 MG capsule Take 100 mg by mouth 3 (three) times daily as needed for cough.      . bismuth subsalicylate (PEPTO BISMOL) 262 MG chewable tablet Chew 524 mg by mouth as needed.        . diazepam (VALIUM) 5 MG tablet Take 5 mg by mouth every 6 (six) hours as needed.        . furosemide (LASIX) 20 MG tablet Take 20 mg by mouth as needed.       Marland Kitchen HYDROcodone-acetaminophen (NORCO/VICODIN) 5-325 MG per tablet Take 1 tablet by mouth every 4 (four) hours as needed for pain.  60 tablet  4  . ipratropium (ATROVENT HFA) 17 MCG/ACT inhaler Inhale 2 puffs into the lungs 4 (four) times daily.      Marland Kitchen levothyroxine (SYNTHROID, LEVOTHROID) 25 MCG tablet TAKE (1) TABLET BY MOUTH ONCE DAILY.  30 tablet  5  . loperamide (IMODIUM A-D) 2 MG tablet Take 1 tablet (2 mg total) by mouth daily.  30 tablet  0  . metoprolol (TOPROL-XL) 50 MG 24 hr tablet Take 50 mg by mouth daily.        Marland Kitchen NAMENDA 10 MG tablet TAKE 1 TABLET TWICE A DAY FOR DEMENTIA.  60 tablet  5  . nystatin (MYCOSTATIN) 100000 UNIT/ML suspension TAKE AS DIRECTED.  240 mL  2  .  ondansetron (ZOFRAN) 8 MG tablet TAKE 1/2 TABLET THREE TIMES DAILY AS NEEDED FOR NAUSEA  12 tablet  12  . PRESCRIPTION MEDICATION Take by mouth 2 (two) times daily. Domperidone 10 mg daily      . rifaximin (XIFAXAN) 550 MG TABS Take 1 tablet (550 mg total) by mouth 2 (two) times daily.  28 tablet  0  . venlafaxine XR (EFFEXOR-XR) 150 MG 24 hr capsule TAKE 1 CAPSULE ONCE DAILY FOR DEPRESSION.  30 capsule  5   No current facility-administered medications for this visit.     Objective: Blood pressure 128/70, pulse 68, temperature 98 F (36.7 C), temperature source Oral, resp. rate 18, height 4\' 11"  (1.499 m), weight 86 lb 8 oz (39.236 kg).  patient is alert and in no acute distress. Conjunctiva is pink. Sclera is nonicteric Oropharyngeal mucosa is normal. No neck masses or thyromegaly noted.. Abdomen is flat. Ileostomy is located in right lower quadrant. There is scant amount of pasty stool in the back. Abdomen is soft and nontender without organomegaly or masses to  No LE edema or clubbing noted.   Assessment:  #1. Gastroparesis. She is doing well with low dose domperidone. She did not do well off medication. [place] #2. Bloating secondary to small bowel bacterial overgrowth. Will consider  another course of antibiotic if symptoms become intractable.    Plan:  Continue domperidone 10 mg by mouth twice a day. Call if bloating becomes intolerable. Office visit in 6 months.

## 2013-03-17 ENCOUNTER — Other Ambulatory Visit: Payer: Self-pay | Admitting: *Deleted

## 2013-03-17 ENCOUNTER — Encounter: Payer: Self-pay | Admitting: Family Medicine

## 2013-03-17 ENCOUNTER — Ambulatory Visit (INDEPENDENT_AMBULATORY_CARE_PROVIDER_SITE_OTHER): Payer: Medicare Other | Admitting: Family Medicine

## 2013-03-17 VITALS — BP 142/72 | Ht 59.0 in | Wt 84.2 lb

## 2013-03-17 DIAGNOSIS — R222 Localized swelling, mass and lump, trunk: Secondary | ICD-10-CM | POA: Diagnosis not present

## 2013-03-17 DIAGNOSIS — R634 Abnormal weight loss: Secondary | ICD-10-CM

## 2013-03-17 DIAGNOSIS — R918 Other nonspecific abnormal finding of lung field: Secondary | ICD-10-CM | POA: Insufficient documentation

## 2013-03-17 MED ORDER — MOMETASONE FUROATE 0.1 % EX CREA: TOPICAL_CREAM | CUTANEOUS | Status: DC

## 2013-03-17 NOTE — Progress Notes (Signed)
  Subjective:    Patient ID: [name], female    DOB: April 03, 1929, 79 y.o.   MRN: [id number]  HPI Here today for check up. Patient relates he is taking her medications her colostomy is working well she states she's also doing a good job of trying to keep good need traction. She is maintaining her weight has not dropped any.  She also has a history of a pulmonary growth that has been followed multiple times with a CAT scan so far and normal by the end of the year we'll need repeat CT scan in  Also concerned about a rash that she has had for a long time now. She said it comes and goes. It is dry and itchy on her arm, back, and chest. She would like a cream for it.   No other concerns.    Review of Systems Patient denies any shortness of breath chest pain she is still very weak she does not do much of her own activities she depends on her aide for help.    Objective:   Physical Exam Lungs are clear heart regular patient has significant dry skin on her upper back I believe that it's causing her itching in steroid cream along with lotion should benefit extremities no edema       Assessment & Plan:   dry skin steroid cream and  Patient holding her weight Pulmonary growth repeat CT scan at the end of the year followup 3 monthslotion as above

## 2013-03-22 ENCOUNTER — Telehealth: Payer: Self-pay | Admitting: Family Medicine

## 2013-03-22 MED ORDER — DIAZEPAM 5 MG PO TABS: 5.0000 mg | ORAL_TABLET | Freq: Three times a day (TID) | ORAL | Status: DC | PRN

## 2013-03-22 NOTE — Telephone Encounter (Signed)
See fax , doseage at 5 mg tid valium

## 2013-03-22 NOTE — Telephone Encounter (Signed)
Patient needs Rx for diazepam to [facility] call patient when complete.

## 2013-03-22 NOTE — Telephone Encounter (Signed)
Rx refill on diazepam printed and faxed to [facility]. Patient was notified.

## 2013-03-25 ENCOUNTER — Other Ambulatory Visit: Payer: Self-pay | Admitting: Family Medicine

## 2013-05-03 ENCOUNTER — Encounter: Payer: Self-pay | Admitting: Family Medicine

## 2013-05-03 ENCOUNTER — Ambulatory Visit (INDEPENDENT_AMBULATORY_CARE_PROVIDER_SITE_OTHER): Payer: Medicare Other | Admitting: Family Medicine

## 2013-05-03 VITALS — BP 150/90 | Ht 59.0 in | Wt 80.8 lb

## 2013-05-03 DIAGNOSIS — R531 Weakness: Secondary | ICD-10-CM | POA: Insufficient documentation

## 2013-05-03 DIAGNOSIS — Z23 Encounter for immunization: Secondary | ICD-10-CM

## 2013-05-03 DIAGNOSIS — R197 Diarrhea, unspecified: Secondary | ICD-10-CM | POA: Diagnosis not present

## 2013-05-03 DIAGNOSIS — R252 Cramp and spasm: Secondary | ICD-10-CM | POA: Diagnosis not present

## 2013-05-03 DIAGNOSIS — R634 Abnormal weight loss: Secondary | ICD-10-CM

## 2013-05-03 DIAGNOSIS — Z79899 Other long term (current) drug therapy: Secondary | ICD-10-CM

## 2013-05-03 DIAGNOSIS — R5381 Other malaise: Secondary | ICD-10-CM

## 2013-05-03 LAB — CBC
MCH: 33.6 pg (ref 26.0–34.0)
MCHC: 33.7 g/dL (ref 30.0–36.0)
Platelets: 344 10*3/uL (ref 150–400)

## 2013-05-03 NOTE — Progress Notes (Signed)
  Subjective:    Patient ID: [name], female    DOB: 04-29-1929, 79 y.o.   MRN: [id number]  Leg Pain  The incident occurred more than 1 week ago. The incident occurred at home. There was no injury mechanism. The pain is present in the left leg and right leg. The quality of the pain is described as cramping. The pain has been intermittent since onset. She reports no foreign bodies present. The symptoms are aggravated by weight bearing.   This patient also relates that she is having significant fatigue tiredness weakness in the leg in addition to this he notes that her toes feel cold and sometimes get darker looking in addition to this she also relates intermittent muscle cramps she denies sweats chills nausea vomiting diarrhea. She denies any fevers. She states her appetite is doing okay her weight is down 2 pounds  Extensive past medical history this was reviewed family history reviewed patient does not smoke   Review of Systems See above    Objective:   Physical Exam Lungs are clear no crackles heart is regular pulse normal abdomen soft extremities cool toes has venous stasis changes in the feet slight diminished pulses in both feet       Assessment & Plan:  Probable peripheral vascular disease might need ultrasound of arteries [name] will do extensive lab need to rule out other problems warnings discussed no medications indicated currently History hypothyroidism check TSH History renal insufficiency check metastases 7

## 2013-05-04 ENCOUNTER — Telehealth: Payer: Self-pay | Admitting: Family Medicine

## 2013-05-04 LAB — BASIC METABOLIC PANEL
CO2: 29 mEq/L (ref 19–32)
Chloride: 94 mEq/L — ABNORMAL LOW (ref 96–112)
Sodium: 136 mEq/L (ref 135–145)

## 2013-05-04 LAB — HEPATIC FUNCTION PANEL
ALT: 15 U/L (ref 0–35)
AST: 22 U/L (ref 0–37)
Albumin: 4.7 g/dL (ref 3.5–5.2)
Alkaline Phosphatase: 100 U/L (ref 39–117)
Bilirubin, Direct: 0.1 mg/dL (ref 0.0–0.3)
Indirect Bilirubin: 0.3 mg/dL (ref 0.0–0.9)
Total Bilirubin: 0.4 mg/dL (ref 0.3–1.2)
Total Protein: 7.7 g/dL (ref 6.0–8.3)

## 2013-05-04 NOTE — Telephone Encounter (Signed)
She is mistaken. Yesterday I saw her for weakness. She already has plenty of medicines. She does not need more medicines. She ought to use what she has.

## 2013-05-04 NOTE — Telephone Encounter (Signed)
Patient thought she was going to have something called in for headaches from yesterdays visit, but pharmacy does not have anything.    [facility]

## 2013-05-04 NOTE — Telephone Encounter (Signed)
Discussed with patient. Patient verbalized understanding. 

## 2013-05-07 ENCOUNTER — Other Ambulatory Visit: Payer: Self-pay | Admitting: *Deleted

## 2013-05-07 DIAGNOSIS — Z79899 Other long term (current) drug therapy: Secondary | ICD-10-CM

## 2013-05-07 NOTE — Progress Notes (Signed)
Spoke with patient regarding: Patient's lab test shows that she's not drinking enough liquids. Encourage patient to drink more liquids. Repeat metabolic 7 Monday or Tuesday of next week. Followup office visit in one to 2 days later. Bring all medicines with next visit. Pt verbalized understanding. Has appt Thursday at 1:45pm

## 2013-05-10 ENCOUNTER — Other Ambulatory Visit: Payer: Self-pay | Admitting: *Deleted

## 2013-05-10 DIAGNOSIS — Z79899 Other long term (current) drug therapy: Secondary | ICD-10-CM | POA: Diagnosis not present

## 2013-05-11 LAB — BASIC METABOLIC PANEL
BUN: 29 mg/dL — ABNORMAL HIGH (ref 6–23)
Chloride: 98 mEq/L (ref 96–112)
Glucose, Bld: 102 mg/dL — ABNORMAL HIGH (ref 70–99)
Potassium: 4.9 mEq/L (ref 3.5–5.3)

## 2013-05-12 ENCOUNTER — Ambulatory Visit: Payer: Medicare Other | Admitting: Family Medicine

## 2013-05-13 ENCOUNTER — Ambulatory Visit (INDEPENDENT_AMBULATORY_CARE_PROVIDER_SITE_OTHER): Payer: Medicare Other | Admitting: Family Medicine

## 2013-05-13 ENCOUNTER — Encounter: Payer: Self-pay | Admitting: Family Medicine

## 2013-05-13 VITALS — BP 110/70 | Ht 59.0 in | Wt 85.2 lb

## 2013-05-13 DIAGNOSIS — N289 Disorder of kidney and ureter, unspecified: Secondary | ICD-10-CM | POA: Diagnosis not present

## 2013-05-13 DIAGNOSIS — R634 Abnormal weight loss: Secondary | ICD-10-CM | POA: Diagnosis not present

## 2013-05-13 NOTE — Progress Notes (Signed)
  Subjective:    Patient ID: [name], female    DOB: 02-14-1929, 79 y.o.   MRN: [id number]  HPI Patient is here today for a follow up visit on dehydration. Patient had bloodwork repeated on 05/10/13 and would like to discuss the results.   Patient states that she is still experiencing headaches. It has been present for a few months now. She has had chronic headaches for years. She's are ready on pain medication. She denies these headaches waking her up no double vision or vomiting. It should be noted that she has a small growth in the lung but she has had frequent CT scans which have not shown any progression pulmonologist did not feel it was cancer. She does try to eat to keep her weight up. She does have a colostomy she'll be seen a gastroenterologist in  PMH reviewed   Review of Systems Denies fever sweats chills nausea vomiting    Objective:   Physical Exam Her lungs are clear hearts regular extremities no edema her weight is stable Her lab work was reviewed in detail shows an improvement in the kidney function. No need for any type of additional measures.       Assessment & Plan:  #1 recent renal insufficiency actually improved compared where was continue current measures #2 chronic headaches she is already on pain medication I do not want to add additional medicine. #3 weight is stable recheck her again in 6 weeks #4 pulmonary growth no need for repeat CT at this point

## 2013-05-19 ENCOUNTER — Other Ambulatory Visit: Payer: Self-pay | Admitting: Family Medicine

## 2013-05-21 ENCOUNTER — Other Ambulatory Visit: Payer: Self-pay | Admitting: Family Medicine

## 2013-06-21 ENCOUNTER — Other Ambulatory Visit: Payer: Self-pay | Admitting: Family Medicine

## 2013-06-23 ENCOUNTER — Ambulatory Visit: Payer: Medicare Other | Admitting: Family Medicine

## 2013-06-24 ENCOUNTER — Ambulatory Visit: Payer: Medicare Other | Admitting: Family Medicine

## 2013-07-01 ENCOUNTER — Telehealth: Payer: Self-pay | Admitting: Family Medicine

## 2013-07-01 NOTE — Telephone Encounter (Signed)
Pt has been taking BA's (her spouse) potassium pills for no apparent reason. Home Health Nurse wanted to let us know so we can address this situation. She has a appt on 07/06/13

## 2013-07-01 NOTE — Telephone Encounter (Signed)
Very interesting. Please ask the patient to bring all of her medicines with her next visit.

## 2013-07-01 NOTE — Telephone Encounter (Signed)
Discussed with the home health aid.

## 2013-07-06 ENCOUNTER — Encounter: Payer: Self-pay | Admitting: Family Medicine

## 2013-07-06 ENCOUNTER — Ambulatory Visit (INDEPENDENT_AMBULATORY_CARE_PROVIDER_SITE_OTHER): Payer: Medicare Other | Admitting: Family Medicine

## 2013-07-06 VITALS — BP 110/72 | Ht 59.0 in | Wt 87.4 lb

## 2013-07-06 DIAGNOSIS — R634 Abnormal weight loss: Secondary | ICD-10-CM | POA: Diagnosis not present

## 2013-07-06 DIAGNOSIS — E876 Hypokalemia: Secondary | ICD-10-CM | POA: Diagnosis not present

## 2013-07-06 DIAGNOSIS — R222 Localized swelling, mass and lump, trunk: Secondary | ICD-10-CM

## 2013-07-06 DIAGNOSIS — R918 Other nonspecific abnormal finding of lung field: Secondary | ICD-10-CM

## 2013-07-06 DIAGNOSIS — R51 Headache: Secondary | ICD-10-CM | POA: Diagnosis not present

## 2013-07-06 MED ORDER — HYDROCODONE-ACETAMINOPHEN 5-325 MG PO TABS: 1.0000 | ORAL_TABLET | ORAL | Status: DC | PRN

## 2013-07-06 MED ORDER — POTASSIUM CHLORIDE ER 10 MEQ PO TBCR: 10.0000 meq | EXTENDED_RELEASE_TABLET | Freq: Every day | ORAL | Status: DC

## 2013-07-06 MED ORDER — FLUTICASONE PROPIONATE 50 MCG/ACT NA SUSP: 2.0000 | Freq: Every day | NASAL | Status: DC

## 2013-07-06 MED ORDER — DIAZEPAM 5 MG PO TABS: 5.0000 mg | ORAL_TABLET | Freq: Three times a day (TID) | ORAL | Status: DC | PRN

## 2013-07-06 NOTE — Progress Notes (Signed)
   Subjective:    Patient ID: [name], female    DOB: [birth year], 79 y.o.   MRN: [id number]  HPI Patient is here today for a follow up visit on weight loss. States she is having allergy symptoms (runny nose, sneezing, etc). She would like this looked at also.  This patient has had some episodes of hypokalemia. She does need this to be followed  She also has intermittent episodes of headaches. She wonders if there's anything she can take for it. In the past she has taken Tylenol and she states it doesn't help she wondered if she can take hydrocodone she uses is currently twice a day for abdominal pains and back pains I told her it would not be wise to continue adding additional narcotic meds  Patient also has gained a couple pounds she is trying eat healthier. She is watching how she does with breathing and try to improve the amount of calories she takes in  She does have a pulmonary lesion that there were some characteristics consistent with possibility of tumor but she also states that it's been stable for a while the radiologist recommended a followup 16 months which would be now  Past medical history she does have a history of gastric surgery colostomy. History of adhesions chronic headaches chronic back pain She doesn't smoke  Review of Systems  Constitutional: Negative for activity change, appetite change and fatigue.  HENT: Negative for congestion.   Respiratory: Positive for cough. Negative for choking, chest tightness and shortness of breath.   Cardiovascular: Negative for chest pain.  Gastrointestinal: Negative for abdominal pain.  Endocrine: Negative for polydipsia and polyphagia.  Genitourinary: Negative for frequency.  Neurological: Positive for headaches. Negative for weakness.  Psychiatric/Behavioral: Negative for confusion.       Objective:   Physical Exam  Vitals reviewed. Constitutional: She appears well-nourished. No distress.  Cardiovascular: Normal rate,  regular rhythm and normal heart sounds.   No murmur heard. Pulmonary/Chest: Effort normal and breath sounds normal. No respiratory distress.  Musculoskeletal: She exhibits no edema.  Lymphadenopathy:    She has no cervical adenopathy.  Neurological: She is alert. She exhibits normal muscle tone.  Psychiatric: Her behavior is normal.          Assessment & Plan:  Weight loss  Lung mass - Plan: CT Chest Wo Contrast  Chronic headaches  Hypokalemia  I am glad to see that she's gained a couple pounds she needs to watch her diet closely we will do a CT scan to make sure the lung mass has not grown I don't recommend additional hydrocodone no more than 2 per day 3 prescriptions were given. She needs a followup in 3 months also add potassium 10 mEq daily plus recheck metabolic 7 in a few months time followup sooner if any problems

## 2013-07-19 ENCOUNTER — Telehealth: Payer: Self-pay | Admitting: Family Medicine

## 2013-07-19 ENCOUNTER — Ambulatory Visit (HOSPITAL_COMMUNITY)
Admission: RE | Admit: 2013-07-19 | Discharge: 2013-07-19 | Disposition: A | Discharge status: Home / Self Care | Payer: Medicare Other | Source: Ambulatory Visit | Attending: Family Medicine | Admitting: Family Medicine

## 2013-07-19 DIAGNOSIS — R911 Solitary pulmonary nodule: Secondary | ICD-10-CM | POA: Diagnosis not present

## 2013-07-19 DIAGNOSIS — R059 Cough, unspecified: Secondary | ICD-10-CM | POA: Insufficient documentation

## 2013-07-19 DIAGNOSIS — J479 Bronchiectasis, uncomplicated: Secondary | ICD-10-CM | POA: Insufficient documentation

## 2013-07-19 DIAGNOSIS — R05 Cough: Secondary | ICD-10-CM | POA: Insufficient documentation

## 2013-07-19 NOTE — Telephone Encounter (Signed)
Patient notified and explained to her that we gave her 3 months worth in Dec and that she had [facility] pick up the prescriptions

## 2013-07-19 NOTE — Telephone Encounter (Signed)
HYDROcodone-acetaminophen (NORCO/VICODIN) 5-325 MG per tablet  Needs refill please

## 2013-07-21 ENCOUNTER — Other Ambulatory Visit: Payer: Self-pay | Admitting: Family Medicine

## 2013-07-21 NOTE — Progress Notes (Signed)
Patient notified and verbalized understanding of results

## 2013-08-18 ENCOUNTER — Telehealth: Payer: Self-pay | Admitting: Family Medicine

## 2013-08-18 NOTE — Telephone Encounter (Signed)
Tried to obtain prior auth for pt's [facility] 01mg , [facility] states that this medicine is covered and NO prior auth needed due to pt's dose being within their quantity limits

## 2013-08-19 ENCOUNTER — Other Ambulatory Visit: Payer: Self-pay | Admitting: Family Medicine

## 2013-09-07 ENCOUNTER — Ambulatory Visit (INDEPENDENT_AMBULATORY_CARE_PROVIDER_SITE_OTHER): Payer: Medicare Other | Admitting: Internal Medicine

## 2013-09-08 ENCOUNTER — Other Ambulatory Visit: Payer: Self-pay | Admitting: *Deleted

## 2013-09-08 MED ORDER — CARISOPRODOL 350 MG PO TABS: 350.0000 mg | ORAL_TABLET | Freq: Two times a day (BID) | ORAL | Status: DC | PRN

## 2013-09-14 ENCOUNTER — Ambulatory Visit (INDEPENDENT_AMBULATORY_CARE_PROVIDER_SITE_OTHER): Payer: Medicare Other | Admitting: Internal Medicine

## 2013-09-14 ENCOUNTER — Encounter (INDEPENDENT_AMBULATORY_CARE_PROVIDER_SITE_OTHER): Payer: Self-pay | Admitting: Internal Medicine

## 2013-09-14 VITALS — BP 104/52 | HR 64 | Temp 98.8°F | Ht 59.0 in | Wt 88.1 lb

## 2013-09-14 DIAGNOSIS — K219 Gastro-esophageal reflux disease without esophagitis: Secondary | ICD-10-CM | POA: Diagnosis not present

## 2013-09-14 DIAGNOSIS — K3184 Gastroparesis: Secondary | ICD-10-CM | POA: Diagnosis not present

## 2013-09-14 NOTE — Addendum Note (Signed)
Addended by: Butch Penny on: 09/14/2013 05:05 PM   Modules accepted: Orders

## 2013-09-14 NOTE — Patient Instructions (Signed)
Continue the Domperidone.. OV in 6 months.

## 2013-09-14 NOTE — Progress Notes (Addendum)
Subjective:     Patient ID: [name], female   DOB: 1929-01-21, 80 y.o.   MRN: [id number]  HPI  Here today or follow. Hx of gastroparesis. She says her appetite is good. She occasionally has nausea. No vomiting.  She occasionally has acid reflux.  She empties her ileostomy bag about 5 times a day. No melena or bright red rectal bleeding/ She is taking Domperidone 10mg  twice a day. She is not having any side effects from the medication. She denies any bloating. She has gained 2 pounds since her last visit in August.  Patient will bring an updated medication list  Tomorrow.     Review of Systems Past Medical History  Diagnosis Date  . Lower abdominal pain   . Gastroesophageal reflux disease   . IBS (irritable bowel syndrome)   . Migraines   . COPD (chronic obstructive pulmonary disease)   . GAD (generalized anxiety disorder)   . Anemia   . Osteoporosis   . Renal insufficiency   . Tachycardia     Past Surgical History  Procedure Laterality Date  . Stomach surgery for a bleeding ulcer when she was in her 19s    . Gastrectomy    . Tonsillectomy    . Appendectomy    . Cholecystectomy    . Abdominal hysterectomy    . Esophagogastroduodenoscopy      Allergies  Allergen Reactions  . Tetanus Toxoid   . Zolpidem Tartrate     Current Outpatient Prescriptions on File Prior to Visit  Medication Sig Dispense Refill  . alendronate (FOSAMAX) 70 MG tablet TAKE 1 TAB WEEKLY 30 MINS. BEFORE BREAKFAST WITH 8 OZ OF WATER FOR OSTEOPOROSIS.  4 tablet  5  . benzonatate (TESSALON) 100 MG capsule TAKE (1) CAPSULE BY MOUTH THREE TIMES DAILY AS NEEDED.  21 capsule  6  . bismuth subsalicylate (PEPTO BISMOL) 262 MG chewable tablet Chew 524 mg by mouth as needed.        . carisoprodol (SOMA) 350 MG tablet Take 1 tablet (350 mg total) by mouth 2 (two) times daily as needed.  20 tablet  0  . diazepam (VALIUM) 5 MG tablet Take 1 tablet (5 mg total) by mouth 3 (three) times daily as needed.  90  tablet  5  . fluticasone (FLONASE) 50 MCG/ACT nasal spray Place 2 sprays into both nostrils daily.  16 g  5  . furosemide (LASIX) 20 MG tablet Take 20 mg by mouth as needed.       Marland Kitchen HYDROcodone-acetaminophen (NORCO/VICODIN) 5-325 MG per tablet Take 1 tablet by mouth every 4 (four) hours as needed.  60 tablet  0  . ipratropium (ATROVENT HFA) 17 MCG/ACT inhaler Inhale 2 puffs into the lungs 4 (four) times daily.      Marland Kitchen levothyroxine (SYNTHROID, LEVOTHROID) 25 MCG tablet TAKE (1) TABLET BY MOUTH ONCE DAILY.  30 tablet  5  . loperamide (IMODIUM A-D) 2 MG tablet Take 2 mg by mouth daily as needed.      . metoprolol (TOPROL-XL) 50 MG 24 hr tablet Take 50 mg by mouth daily.        . metoprolol tartrate (LOPRESSOR) 25 MG tablet TAKE (1) TABLET TWICE A DAY FOR HIGH BLOOD PRESSURE.  60 tablet  5  . mometasone (ELOCON) 0.1 % cream Apply to affected area daily  45 g  1  . NAMENDA 10 MG tablet TAKE 1 TABLET TWICE A DAY FOR DEMENTIA.  60 tablet  5  .  nystatin (MYCOSTATIN) 100000 UNIT/ML suspension TAKE AS DIRECTED.  240 mL  2  . ondansetron (ZOFRAN) 8 MG tablet TAKE 1/2 TABLET THREE TIMES DAILY AS NEEDED FOR NAUSEA  12 tablet  12  . pantoprazole (PROTONIX) 40 MG tablet TAKE ONE TABLET DAILY FOR ACID REFLUX.  30 tablet  5  . potassium chloride (K-DUR) 10 MEQ tablet Take 1 tablet (10 mEq total) by mouth daily.  30 tablet  6  . PRESCRIPTION MEDICATION Take by mouth 2 (two) times daily. Domperidone 10 mg daily      . venlafaxine XR (EFFEXOR-XR) 150 MG 24 hr capsule TAKE 1 CAPSULE ONCE DAILY FOR DEPRESSION.  30 capsule  5   No current facility-administered medications on file prior to visit.        Objective:   Physical Exam  Filed Vitals:   09/14/13 1547  BP: 104/52  Pulse: 64  Temp: 98.8 F (37.1 C)  Height: 4\' 11"  (1.499 m)  Weight: 88 lb 1.6 oz (39.962 kg)   Alert and oriented. Skin warm and dry. Oral mucosa is moist.   . Sclera anicteric, conjunctivae is pink. Thyroid not enlarged. No cervical  lymphadenopathy. Lungs clear. Heart regular rate and rhythm.  Abdomen is soft. Bowel sounds are positive. No hepatomegaly. No abdominal masses felt. No tenderness. Ileostomy bag in place.  No edema to lower extremities.       Assessment:     Gastroparesis. She seems to be doing well. She has gained 2 pounds since her last visit.    Plan:    Continue Domperidone 10mg  BID. OV in 6 months.  Addendum: Pharmacy faxed list of her medications.

## 2013-09-23 ENCOUNTER — Other Ambulatory Visit: Payer: Self-pay | Admitting: Family Medicine

## 2013-10-04 ENCOUNTER — Ambulatory Visit (INDEPENDENT_AMBULATORY_CARE_PROVIDER_SITE_OTHER): Payer: Medicare Other | Admitting: Family Medicine

## 2013-10-04 ENCOUNTER — Encounter: Payer: Self-pay | Admitting: Family Medicine

## 2013-10-04 VITALS — BP 118/82 | Ht 59.0 in | Wt 87.8 lb

## 2013-10-04 DIAGNOSIS — E039 Hypothyroidism, unspecified: Secondary | ICD-10-CM

## 2013-10-04 DIAGNOSIS — N289 Disorder of kidney and ureter, unspecified: Secondary | ICD-10-CM

## 2013-10-04 DIAGNOSIS — M5430 Sciatica, unspecified side: Secondary | ICD-10-CM

## 2013-10-04 DIAGNOSIS — M810 Age-related osteoporosis without current pathological fracture: Secondary | ICD-10-CM | POA: Diagnosis not present

## 2013-10-04 DIAGNOSIS — R252 Cramp and spasm: Secondary | ICD-10-CM

## 2013-10-04 DIAGNOSIS — R634 Abnormal weight loss: Secondary | ICD-10-CM | POA: Diagnosis not present

## 2013-10-04 MED ORDER — HYDROCODONE-ACETAMINOPHEN 5-325 MG PO TABS: 1.0000 | ORAL_TABLET | ORAL | Status: DC | PRN

## 2013-10-04 MED ORDER — HYDROCODONE-ACETAMINOPHEN 5-325 MG PO TABS
1.0000 | ORAL_TABLET | ORAL | Status: DC | PRN
Start: 2013-10-04 — End: 2013-10-04

## 2013-10-04 NOTE — Progress Notes (Signed)
   Subjective:    Patient ID: [name], female    DOB: [birth year], 80 y.o.   MRN: [id number]  HPI Patient arrives for a follow up on pain meds. Patient currently averages 2 hydrocodone a day for back pain. Patient would also like a spot on her back checked. This patient was seen today for chronic pain  The medication list was reviewed and updated.  Discussion was held with the patient regarding compliance with pain medication. The patient was advised the importance of maintaining medication and not using illegal substances with these. The patient was educated that we can provide 3 monthly scripts for their medication, it is their responsibility to follow the instructions. Discussion was held with the patient to make sure they're not having significant side effects. Patient is aware that pain medications are meant to minimize the severity of the pain to allow their pain levels to improve to allow for better function. They are aware of that pain medications cannot totally remove their pain.   She does try to eat healthy. She does try to get increase calories. Review of Systems She denies chest pain she does relate cough denies fever chills sweats. She does relate intermittent back pain and joint pain. She states her colostomy the bag is functioning fine.    Objective:   Physical Exam  On examination lungs clear hearts regular extremities no edema skin warm dry pulses are normal in addition to this patient has a lipoma that measures approximately 1-3/4 inch across in the upper left shoulder blade region near the spinal cord and left shoulder blade. Is soft freely movable.      Assessment & Plan:  Lipoma-we will monitor this see her back again in 3 months Chronic pain hydrocodone she uses it responsibly no more than 2 per day. 3 prescriptions were written  weight loss-weight is stable currently I encourage her with dietary measures to keep things going well Lung lesion-on last CAT scan  it was stable no need to repeat  25 minutes spent with patient. Bone density ordered thyroid ordered

## 2013-10-11 ENCOUNTER — Ambulatory Visit (HOSPITAL_COMMUNITY)
Admission: RE | Admit: 2013-10-11 | Discharge: 2013-10-11 | Disposition: A | Discharge status: Home / Self Care | Payer: Medicare Other | Source: Ambulatory Visit | Attending: Family Medicine | Admitting: Family Medicine

## 2013-10-11 DIAGNOSIS — Z780 Asymptomatic menopausal state: Secondary | ICD-10-CM | POA: Insufficient documentation

## 2013-10-11 DIAGNOSIS — M818 Other osteoporosis without current pathological fracture: Secondary | ICD-10-CM | POA: Diagnosis not present

## 2013-10-11 DIAGNOSIS — M069 Rheumatoid arthritis, unspecified: Secondary | ICD-10-CM | POA: Insufficient documentation

## 2013-10-11 DIAGNOSIS — M810 Age-related osteoporosis without current pathological fracture: Secondary | ICD-10-CM | POA: Diagnosis not present

## 2013-10-13 NOTE — Progress Notes (Signed)
Patient notified and verbalized understanding of the test results. No further questions. 

## 2013-10-15 DIAGNOSIS — R634 Abnormal weight loss: Secondary | ICD-10-CM | POA: Diagnosis not present

## 2013-10-15 DIAGNOSIS — E039 Hypothyroidism, unspecified: Secondary | ICD-10-CM | POA: Diagnosis not present

## 2013-10-15 DIAGNOSIS — R252 Cramp and spasm: Secondary | ICD-10-CM | POA: Diagnosis not present

## 2013-10-15 LAB — TSH: TSH: 4.918 u[IU]/mL — ABNORMAL HIGH (ref 0.350–4.500)

## 2013-10-15 LAB — LIPID PANEL
CHOLESTEROL: 154 mg/dL (ref 0–200)
HDL: 50 mg/dL (ref >39)
LDL Cholesterol: 69 mg/dL (ref 0–99)
TRIGLYCERIDES: 177 mg/dL — AB (ref <150)
Total CHOL/HDL Ratio: 3.1 Ratio
VLDL: 35 mg/dL (ref 0–40)

## 2013-10-15 LAB — BASIC METABOLIC PANEL
BUN: 26 mg/dL — AB (ref 6–23)
CHLORIDE: 96 meq/L (ref 96–112)
CO2: 31 mEq/L (ref 19–32)
CREATININE: 1.08 mg/dL (ref 0.50–1.10)
Calcium: 9.6 mg/dL (ref 8.4–10.5)
GLUCOSE: 95 mg/dL (ref 70–99)
POTASSIUM: 3.7 meq/L (ref 3.5–5.3)
Sodium: 139 mEq/L (ref 135–145)

## 2013-10-18 ENCOUNTER — Other Ambulatory Visit: Payer: Self-pay | Admitting: *Deleted

## 2013-10-18 MED ORDER — LEVOTHYROXINE SODIUM 25 MCG PO TABS: ORAL_TABLET | ORAL | Status: DC

## 2013-11-08 ENCOUNTER — Telehealth: Payer: Self-pay | Admitting: Family Medicine

## 2013-11-08 NOTE — Telephone Encounter (Signed)
Please see attached to chart a form for Parking Placard  Mail to pts address when done

## 2013-11-09 NOTE — Telephone Encounter (Signed)
done

## 2013-11-18 ENCOUNTER — Other Ambulatory Visit: Payer: Self-pay | Admitting: Family Medicine

## 2013-11-25 ENCOUNTER — Ambulatory Visit (INDEPENDENT_AMBULATORY_CARE_PROVIDER_SITE_OTHER): Payer: Medicare Other | Admitting: Family Medicine

## 2013-11-25 ENCOUNTER — Encounter: Payer: Self-pay | Admitting: Family Medicine

## 2013-11-25 ENCOUNTER — Ambulatory Visit (HOSPITAL_COMMUNITY)
Admission: RE | Admit: 2013-11-25 | Discharge: 2013-11-25 | Disposition: A | Discharge status: Home / Self Care | Payer: Medicare Other | Source: Ambulatory Visit | Attending: Family Medicine | Admitting: Family Medicine

## 2013-11-25 VITALS — BP 100/60 | Temp 98.5°F | Ht 59.0 in | Wt 86.5 lb

## 2013-11-25 DIAGNOSIS — R05 Cough: Secondary | ICD-10-CM | POA: Diagnosis not present

## 2013-11-25 DIAGNOSIS — R634 Abnormal weight loss: Secondary | ICD-10-CM | POA: Diagnosis not present

## 2013-11-25 DIAGNOSIS — R059 Cough, unspecified: Secondary | ICD-10-CM

## 2013-11-25 DIAGNOSIS — J4489 Other specified chronic obstructive pulmonary disease: Secondary | ICD-10-CM | POA: Insufficient documentation

## 2013-11-25 DIAGNOSIS — R0989 Other specified symptoms and signs involving the circulatory and respiratory systems: Secondary | ICD-10-CM | POA: Diagnosis not present

## 2013-11-25 DIAGNOSIS — J449 Chronic obstructive pulmonary disease, unspecified: Secondary | ICD-10-CM | POA: Insufficient documentation

## 2013-11-25 DIAGNOSIS — J019 Acute sinusitis, unspecified: Secondary | ICD-10-CM

## 2013-11-25 DIAGNOSIS — M4120 Other idiopathic scoliosis, site unspecified: Secondary | ICD-10-CM | POA: Insufficient documentation

## 2013-11-25 LAB — CBC WITH DIFFERENTIAL/PLATELET
Basophils Absolute: 0 10*3/uL (ref 0.0–0.1)
Basophils Relative: 0 % (ref 0–1)
EOS PCT: 0 % (ref 0–5)
Eosinophils Absolute: 0 10*3/uL (ref 0.0–0.7)
HEMATOCRIT: 48.3 % — AB (ref 36.0–46.0)
HEMOGLOBIN: 15.9 g/dL — AB (ref 12.0–15.0)
LYMPHS ABS: 5.1 10*3/uL — AB (ref 0.7–4.0)
Lymphocytes Relative: 24 % (ref 12–46)
MCH: 32.2 pg (ref 26.0–34.0)
MCHC: 32.9 g/dL (ref 30.0–36.0)
MCV: 97.8 fL (ref 78.0–100.0)
MONOS PCT: 11 % (ref 3–12)
Monocytes Absolute: 2.3 10*3/uL — ABNORMAL HIGH (ref 0.1–1.0)
NEUTROS PCT: 65 % (ref 43–77)
Neutro Abs: 13.7 10*3/uL — ABNORMAL HIGH (ref 1.7–7.7)
Platelets: 305 10*3/uL (ref 150–400)
RBC: 4.94 MIL/uL (ref 3.87–5.11)
RDW: 13.8 % (ref 11.5–15.5)
WBC: 21.1 10*3/uL — ABNORMAL HIGH (ref 4.0–10.5)

## 2013-11-25 MED ORDER — CEFTRIAXONE SODIUM 1 G IJ SOLR
500.0000 mg | Freq: Once | INTRAMUSCULAR | Status: AC
  Administered 2013-11-25: 500 mg via INTRAMUSCULAR

## 2013-11-25 MED ORDER — CEFPROZIL 500 MG PO TABS: 500.0000 mg | ORAL_TABLET | Freq: Two times a day (BID) | ORAL | Status: DC

## 2013-11-25 NOTE — Progress Notes (Signed)
   Subjective:    Patient ID: Tenna Child, female    DOB: Oct 31, 1928, 80 y.o.   MRN: [id number]  Sinusitis This is a new problem. The current episode started in the past 7 days. The problem is unchanged. Maximum temperature: low grade fever. The pain is moderate. Associated symptoms include congestion, coughing, headaches, sinus pressure and a sore throat. Past treatments include oral decongestants. The treatment provided no relief.  Patient states she has no other concerns at this time.     Review of Systems  HENT: Positive for congestion, sinus pressure and sore throat.   Respiratory: Positive for cough.   Neurological: Positive for headaches.       Objective:   Physical Exam Mild sinus tenderness eardrums normal neck is supple lungs clear heart regular       Assessment & Plan:  Acute sinusitis antibiotics prescribed warning signs discussed shot of antibiotics given, lab work and x-rays ordered, followup sooner problems  Mouth headaches-related sinusitis I do not recommend adding more pain medicine  Patient was encouraged dietary intake to improve to help gain weight.  Patient was encouraged to followup in 4-6 weeks

## 2013-11-26 LAB — BASIC METABOLIC PANEL
BUN: 16 mg/dL (ref 6–23)
CALCIUM: 9.8 mg/dL (ref 8.4–10.5)
CO2: 23 meq/L (ref 19–32)
Chloride: 97 mEq/L (ref 96–112)
Creat: 0.96 mg/dL (ref 0.50–1.10)
GLUCOSE: 157 mg/dL — AB (ref 70–99)
Potassium: 4.2 mEq/L (ref 3.5–5.3)
SODIUM: 136 meq/L (ref 135–145)

## 2013-11-26 LAB — TSH: TSH: 2.384 u[IU]/mL (ref 0.350–4.500)

## 2013-11-26 NOTE — Progress Notes (Signed)
Patients husband notified and verbalized understanding 

## 2013-12-17 ENCOUNTER — Ambulatory Visit (INDEPENDENT_AMBULATORY_CARE_PROVIDER_SITE_OTHER): Payer: Medicare Other | Admitting: Family Medicine

## 2013-12-17 ENCOUNTER — Encounter: Payer: Self-pay | Admitting: Family Medicine

## 2013-12-17 ENCOUNTER — Other Ambulatory Visit: Payer: Self-pay | Admitting: Family Medicine

## 2013-12-17 VITALS — BP 140/90 | Temp 98.5°F | Ht 59.0 in | Wt 89.0 lb

## 2013-12-17 DIAGNOSIS — R7309 Other abnormal glucose: Secondary | ICD-10-CM

## 2013-12-17 DIAGNOSIS — R350 Frequency of micturition: Secondary | ICD-10-CM | POA: Diagnosis not present

## 2013-12-17 DIAGNOSIS — R7303 Prediabetes: Secondary | ICD-10-CM

## 2013-12-17 DIAGNOSIS — R739 Hyperglycemia, unspecified: Secondary | ICD-10-CM

## 2013-12-17 LAB — POCT URINALYSIS DIPSTICK
SPEC GRAV UA: 1.01
pH, UA: 5

## 2013-12-17 LAB — POCT GLYCOSYLATED HEMOGLOBIN (HGB A1C): Hemoglobin A1C: 6.4

## 2013-12-17 NOTE — Patient Instructions (Signed)
  Avoid excessive sugars   DASH Diet The DASH diet stands for "Dietary Approaches to Stop Hypertension." It is a healthy eating plan that has been shown to reduce high blood pressure (hypertension) in as little as 14 days, while also possibly providing other significant health benefits. These other health benefits include reducing the risk of breast cancer after menopause and reducing the risk of type 2 diabetes, heart disease, colon cancer, and stroke. Health benefits also include weight loss and slowing kidney failure in patients with chronic kidney disease.  DIET GUIDELINES  Limit salt (sodium). Your diet should contain less than 1500 mg of sodium daily.  Limit refined or processed carbohydrates. Your diet should include mostly whole grains. Desserts and added sugars should be used sparingly.  Include small amounts of heart-healthy fats. These types of fats include nuts, oils, and tub margarine. Limit saturated and trans fats. These fats have been shown to be harmful in the body. CHOOSING FOODS  The following food groups are based on a 2000 calorie diet. See your Registered Dietitian for individual calorie needs. Grains and Grain Products (6 to 8 servings daily)  Eat More Often: Whole-wheat bread, brown rice, whole-grain or wheat pasta, quinoa, popcorn without added fat or salt (air popped).  Eat Less Often: White bread, white pasta, white rice, cornbread. Vegetables (4 to 5 servings daily)  Eat More Often: Fresh, frozen, and canned vegetables. Vegetables may be raw, steamed, roasted, or grilled with a minimal amount of fat.  Eat Less Often/Avoid: Creamed or fried vegetables. Vegetables in a cheese sauce. Fruit (4 to 5 servings daily)  Eat More Often: All fresh, canned (in natural juice), or frozen fruits. Dried fruits without added sugar. One hundred percent fruit juice ( cup [237 mL] daily).  Eat Less Often: Dried fruits with added sugar. Canned fruit in light or heavy syrup. [employer], Fish, and Poultry (2 servings or less daily. One serving is 3 to 4 oz [85-114 g]).  Eat More Often: Ninety percent or leaner ground beef, tenderloin, sirloin. Round cuts of beef, chicken breast, [place] breast. All fish. Grill, bake, or broil your meat. Nothing should be fried.  Eat Less Often/Avoid: Fatty cuts of meat, [place], or chicken leg, thigh, or wing. Fried cuts of meat or fish. Dairy (2 to 3 servings)  Eat More Often: Low-fat or fat-free milk, low-fat plain or light yogurt, reduced-fat or part-skim cheese.  Eat Less Often/Avoid: Milk (whole, 2%).Whole milk yogurt. Full-fat cheeses. Nuts, Seeds, and Legumes (4 to 5 servings per week)  Eat More Often: All without added salt.  Eat Less Often/Avoid: Salted nuts and seeds, canned beans with added salt. Fats and Sweets (limited)  Eat More Often: Vegetable oils, tub margarines without trans fats, sugar-free gelatin. Mayonnaise and salad dressings.  Eat Less Often/Avoid: Coconut oils, palm oils, butter, stick margarine, cream, half and half, cookies, candy, pie. FOR MORE INFORMATION The Dash Diet Eating Plan: www.dashdiet.org Document Released: 06/20/2011 Document Revised: 09/23/2011 Document Reviewed: 06/20/2011 [facility] Patient Information 2014 [place], [place].

## 2013-12-17 NOTE — Progress Notes (Signed)
   Subjective:    Patient ID: [name], female    DOB: 11/06/1928, 80 y.o.   MRN: [id number]  HPI Patient is here today for a f/u on her cough from 5/14 visit.  She states she feels better, but she is still coughing and has a sinus headache.   Pt having urinary frequency. This has been happening off and on for about 2 weeks now.     Review of Systems  Constitutional: Negative for activity change, appetite change and fatigue.  HENT: Negative for congestion.   Respiratory: Positive for cough and shortness of breath. Negative for wheezing and stridor.   Cardiovascular: Negative for chest pain and leg swelling.  Gastrointestinal: Negative for abdominal pain.  Endocrine: Negative for polydipsia and polyphagia.  Genitourinary: Negative for frequency.  Neurological: Negative for weakness.  Psychiatric/Behavioral: Negative for confusion.       Objective:   Physical Exam  Vitals reviewed. Constitutional: She appears well-nourished. No distress.  Cardiovascular: Normal rate, regular rhythm and normal heart sounds.   No murmur heard. Pulmonary/Chest: Effort normal and breath sounds normal. No respiratory distress.  Musculoskeletal: She exhibits no edema.  Lymphadenopathy:    She has no cervical adenopathy.  Neurological: She is alert. She exhibits normal muscle tone.  Psychiatric: Her behavior is normal.          Assessment & Plan:  #1 patient is holding her weight which I am glad. #2 mild sinus symptoms I really don't feel the patient needs further antibiotics currently #3 urine under the microscope looks good we will do a culture #4 prediabetes patient watch the starches in her diet we will reload A1c again in approximately 4-6 months Patient followup within 2 months

## 2013-12-20 LAB — URINE CULTURE

## 2013-12-21 MED ORDER — CEFPROZIL 500 MG PO TABS: 500.0000 mg | ORAL_TABLET | Freq: Two times a day (BID) | ORAL | Status: AC

## 2013-12-21 NOTE — Progress Notes (Signed)
Med sent in and pt notified

## 2013-12-21 NOTE — Addendum Note (Signed)
Addended byCharolotte Capuchin D on: 12/21/2013 11:34 AM   Modules accepted: Orders

## 2014-01-04 ENCOUNTER — Ambulatory Visit (INDEPENDENT_AMBULATORY_CARE_PROVIDER_SITE_OTHER): Payer: Medicare Other | Admitting: Family Medicine

## 2014-01-04 ENCOUNTER — Encounter: Payer: Self-pay | Admitting: Family Medicine

## 2014-01-04 ENCOUNTER — Ambulatory Visit: Payer: Medicare Other | Admitting: Family Medicine

## 2014-01-04 VITALS — Temp 98.2°F | Ht 59.0 in | Wt 89.0 lb

## 2014-01-04 DIAGNOSIS — F411 Generalized anxiety disorder: Secondary | ICD-10-CM | POA: Diagnosis not present

## 2014-01-04 DIAGNOSIS — J019 Acute sinusitis, unspecified: Secondary | ICD-10-CM

## 2014-01-04 MED ORDER — CITALOPRAM HYDROBROMIDE 10 MG PO TABS
10.0000 mg | ORAL_TABLET | Freq: Every day | ORAL | Status: DC
Start: 2014-01-04 — End: 2014-03-16

## 2014-01-04 MED ORDER — DOXYCYCLINE HYCLATE 100 MG PO TABS: 100.0000 mg | ORAL_TABLET | Freq: Two times a day (BID) | ORAL | Status: DC

## 2014-01-04 NOTE — Progress Notes (Signed)
   Subjective:    Patient ID: [name], female    DOB: 1928-07-28, 80 y.o.   MRN: [id number]  HPI She complains of intermittent depression at times feels anxious and worried and feels stressed out her husband has done well on Celexa she would like to try that she no longer wants to take Effexor  She relates progressive sinus pressure congestion coughing drainage. She denies high fever chills.   Review of Systems See above    Objective:   Physical Exam  Her lungs clear heart regular neck no masses sinuses and minimal tenderness      Assessment & Plan:  Sinusitis Antibiotics prescribed followup if ongoing troubles warning signs discussed if high fevers or worse followup  Patient also has mild depression she would like to try something other than Effexor stop the Effexor try Celexa 10 mg followup in 4 weeks. Followup sooner problems.

## 2014-01-11 ENCOUNTER — Other Ambulatory Visit: Payer: Self-pay

## 2014-01-11 MED ORDER — DIAZEPAM 5 MG PO TABS: 5.0000 mg | ORAL_TABLET | Freq: Three times a day (TID) | ORAL | Status: DC | PRN

## 2014-01-14 ENCOUNTER — Other Ambulatory Visit: Payer: Self-pay | Admitting: Family Medicine

## 2014-02-01 ENCOUNTER — Encounter: Payer: Self-pay | Admitting: Family Medicine

## 2014-02-01 ENCOUNTER — Ambulatory Visit (INDEPENDENT_AMBULATORY_CARE_PROVIDER_SITE_OTHER): Payer: Medicare Other | Admitting: Family Medicine

## 2014-02-01 VITALS — BP 112/78 | Ht 59.0 in | Wt 87.8 lb

## 2014-02-01 DIAGNOSIS — Z23 Encounter for immunization: Secondary | ICD-10-CM

## 2014-02-01 DIAGNOSIS — R7303 Prediabetes: Secondary | ICD-10-CM

## 2014-02-01 DIAGNOSIS — R7309 Other abnormal glucose: Secondary | ICD-10-CM

## 2014-02-01 NOTE — Progress Notes (Signed)
   Subjective:    Patient ID: [name], female    DOB: 1929-03-23, 80 y.o.   MRN: [id number]  HPI Patient arrives to follow up on Celexa. Patient states she is doing well on med but having problems with gas- Dr Laural Golden  rec OTC gas med. Patient relates energy level fair. Denies bad headaches. Denies abdominal pain or chest pains. Her moods are doing better.  Review of Systems Patient denies vomiting bloody stools dysuria denies sweats chills nausea    Objective:   Physical Exam  Lungs are clear hearts regular abdomen soft extremities no edema weight is noted      Assessment & Plan:  Cachexia-her appetite is okay. She has gained 1 pound. I encourage her to try to eat rich foods to try to gain weight into the mid 90s  Depression doing better with her new nurses aide. She is also doing well on Celexa.  Prediabetes she knows to avoid high sugars

## 2014-02-09 ENCOUNTER — Telehealth: Payer: Self-pay | Admitting: Family Medicine

## 2014-02-09 NOTE — Telephone Encounter (Signed)
pts CNA Wells Guiles is calling to report a elevated BP on [name]  Her AM BP was 168/87, her BP for PM 179/101  She also has a PNA shot an the sight is warm to the touch  And red ..is this normal?   Please advise

## 2014-02-09 NOTE — Telephone Encounter (Signed)
Pt was seen by Dr. Nicki Reaper on 7/21 for a check up.  Prevnar 13 was given

## 2014-02-09 NOTE — Telephone Encounter (Signed)
Red spot fine and normal, ck bp twice per wk and call us in two weeks with more numbers, no change for now

## 2014-02-10 NOTE — Telephone Encounter (Signed)
Discussed with rebecca.

## 2014-03-16 ENCOUNTER — Other Ambulatory Visit: Payer: Self-pay | Admitting: Family Medicine

## 2014-03-22 ENCOUNTER — Encounter (INDEPENDENT_AMBULATORY_CARE_PROVIDER_SITE_OTHER): Payer: Self-pay | Admitting: Internal Medicine

## 2014-03-22 ENCOUNTER — Ambulatory Visit (INDEPENDENT_AMBULATORY_CARE_PROVIDER_SITE_OTHER): Payer: Medicare Other | Admitting: Internal Medicine

## 2014-03-22 VITALS — BP 100/68 | HR 66 | Temp 98.3°F | Resp 16 | Ht 59.0 in | Wt 84.1 lb

## 2014-03-22 DIAGNOSIS — K6389 Other specified diseases of intestine: Secondary | ICD-10-CM

## 2014-03-22 DIAGNOSIS — K3184 Gastroparesis: Secondary | ICD-10-CM | POA: Diagnosis not present

## 2014-03-22 MED ORDER — RIFAXIMIN 550 MG PO TABS: 550.0000 mg | ORAL_TABLET | Freq: Two times a day (BID) | ORAL | Status: DC

## 2014-03-22 NOTE — Patient Instructions (Signed)
Use Xifaxan as recommended; Please call the office with progress report in 2 weeks.

## 2014-03-22 NOTE — Progress Notes (Signed)
Presenting complaint;  Followup for gastroparesis. Patient complains of bloating and excessive burping.  Subjective:  Patient is an 80 year-old Caucasian female who has history of gastroparesis, GERD history of small bowel bacterial overgrowth who presents for scheduled visit. She was last seen in her office 6 months ago. Patient says she does not feel well. She has noted progressive abdominal bloating frequent burping and she believes she is passing too much gas and her ileostomy bag. She feels stool output has increased and stool is always liquid. She has intermittent epigastric pain. She has had more heartburn the last 2 weeks. She also has noticed some nausea and had single episode of emesis postprandially one week ago. She denies hematemesis tarry stools or bleeding and endoscopy. She tries to eat small meals her multiple snacks. Her weight has decreased by 4 pounds since her last visit.   Current Medications: Outpatient Encounter Prescriptions as of 03/22/2014  Medication Sig  . alendronate (FOSAMAX) 70 MG tablet TAKE 1 TAB WEEKLY 30 MINS. BEFORE BREAKFAST WITH 8 OZ OF WATER FOR OSTEOPOROSIS.  Marland Kitchen ATROVENT HFA 17 MCG/ACT inhaler 2 PUFFS FOUR TIMES DAILY.  Marland Kitchen bismuth subsalicylate (PEPTO BISMOL) 262 MG chewable tablet Chew 524 mg by mouth as needed.    . citalopram (CELEXA) 10 MG tablet TAKE (1) TABLET BY MOUTH ONCE DAILY.  . diazepam (VALIUM) 5 MG tablet Take 1 tablet (5 mg total) by mouth 3 (three) times daily as needed.  . fluticasone (FLONASE) 50 MCG/ACT nasal spray Place 2 sprays into both nostrils daily.  Marland Kitchen HYDROcodone-acetaminophen (NORCO/VICODIN) 5-325 MG per tablet Take 1 tablet by mouth every 4 (four) hours as needed.  Marland Kitchen levothyroxine (SYNTHROID, LEVOTHROID) 25 MCG tablet TAKE 2 TABLETS ON MONDAY AND FRIDAY AND ONE TABLET ALL OTHER DAYS.  Marland Kitchen metoprolol tartrate (LOPRESSOR) 25 MG tablet TAKE (1) TABLET TWICE A DAY FOR HIGH BLOOD PRESSURE.  Marland Kitchen NAMENDA 10 MG tablet TAKE 1 TABLET TWICE A  DAY FOR DEMENTIA.  Marland Kitchen ondansetron (ZOFRAN) 8 MG tablet TAKE 1/2 TABLET THREE TIMES DAILY AS NEEDED FOR NAUSEA  . pantoprazole (PROTONIX) 40 MG tablet TAKE ONE TABLET DAILY FOR ACID REFLUX.  Marland Kitchen potassium chloride (K-DUR) 10 MEQ tablet TAKE (1) TABLET BY MOUTH ONCE DAILY.  Marland Kitchen PRESCRIPTION MEDICATION Take by mouth 2 (two) times daily. Domperidone 10 mg daily  . [DISCONTINUED] carisoprodol (SOMA) 350 MG tablet Take 1 tablet (350 mg total) by mouth 2 (two) times daily as needed.  . [DISCONTINUED] doxycycline (VIBRA-TABS) 100 MG tablet Take 1 tablet (100 mg total) by mouth 2 (two) times daily.     Objective: Blood pressure 100/68, pulse 66, temperature 98.3 F (36.8 C), temperature source Oral, resp. rate 16, height 4\' 11"  (1.499 m), weight 84 lb 1.6 oz (38.148 kg). Patient is alert and in no acute distress. Conjunctiva is pink. Sclera is nonicteric Oropharyngeal mucosa is normal. No neck masses or thyromegaly noted. Cardiac exam with regular rhythm normal S1 and S2. No murmur or gallop noted. Lungs are clear to auscultation. Abdomen exam reveals ileostomy in right lower quadrant. Ileostomy bag has liquid stool in flatus. Bowel sounds are hyperactive. Abdomen is soft with mild epigastric tenderness. No organomegaly or masses.  No LE edema or clubbing noted.   Assessment:  #1. Intractable flatulence associated with weight loss and change in stool consistency most likely secondary to small bowel bacterial overgrowth. #2. Gastroparesis. She is tolerating low dose domperidone. However she has had more heartburn lately which may be due to problem #1. Marland Kitchen  Plan:  Xifaxan 366 mg by mouth 3 times a day for 2 weeks at which time she will call with progress report. If symptoms are unchanged we'll consider switching to a different PPI. Office visit in 3 months.

## 2014-03-23 ENCOUNTER — Encounter: Payer: Self-pay | Admitting: Family Medicine

## 2014-03-23 ENCOUNTER — Ambulatory Visit (INDEPENDENT_AMBULATORY_CARE_PROVIDER_SITE_OTHER): Payer: Medicare Other | Admitting: Family Medicine

## 2014-03-23 ENCOUNTER — Ambulatory Visit: Payer: Medicare Other | Admitting: Family Medicine

## 2014-03-23 VITALS — BP 100/68 | Ht 59.0 in | Wt 82.0 lb

## 2014-03-23 DIAGNOSIS — J019 Acute sinusitis, unspecified: Secondary | ICD-10-CM

## 2014-03-23 MED ORDER — CEFTRIAXONE SODIUM 1 G IJ SOLR
500.0000 mg | Freq: Once | INTRAMUSCULAR | Status: AC
  Administered 2014-03-23: 500 mg via INTRAMUSCULAR

## 2014-03-23 MED ORDER — AMOXICILLIN 500 MG PO CAPS: 500.0000 mg | ORAL_CAPSULE | Freq: Three times a day (TID) | ORAL | Status: DC

## 2014-03-23 NOTE — Progress Notes (Signed)
   Subjective:    Patient ID: [name], female    DOB: [birth year], 80 y.o.   MRN: [id number]  Sinusitis This is a new problem. The current episode started in the past 7 days. The problem is unchanged. There has been no fever. The pain is moderate. Associated symptoms include congestion, coughing and headaches. Pertinent negatives include no ear pain or shortness of breath. Past treatments include spray decongestants. The treatment provided no relief.      Review of Systems  Constitutional: Negative for fever and activity change.  HENT: Positive for congestion and rhinorrhea. Negative for ear pain.   Eyes: Negative for discharge.  Respiratory: Positive for cough. Negative for shortness of breath and wheezing.   Cardiovascular: Negative for chest pain.  Neurological: Positive for headaches.       Objective:   Physical Exam  Nursing note and vitals reviewed. Constitutional: She appears well-developed.  HENT:  Head: Normocephalic.  Nose: Nose normal.  Mouth/Throat: Oropharynx is clear and moist. No oropharyngeal exudate.  Neck: Neck supple.  Cardiovascular: Normal rate and normal heart sounds.   No murmur heard. Pulmonary/Chest: Effort normal and breath sounds normal. She has no wheezes.  Lymphadenopathy:    She has no cervical adenopathy.  Skin: Skin is warm and dry.          Assessment & Plan:  Moderate sinusitis with moderate frontal sinus tenderness I recommend a shot of antibiotics and a short course of antibiotics.  Situational stress with her son hopefully things will get better  Patient is also under the care of gastroenterology they're currently trying to work with her regarding bacterial overgrowth of the intestines  She will followup for her regular checkups

## 2014-04-15 ENCOUNTER — Other Ambulatory Visit: Payer: Self-pay | Admitting: Family Medicine

## 2014-04-18 ENCOUNTER — Other Ambulatory Visit: Payer: Self-pay | Admitting: *Deleted

## 2014-04-18 MED ORDER — POTASSIUM CHLORIDE ER 10 MEQ PO TBCR: EXTENDED_RELEASE_TABLET | ORAL | Status: DC

## 2014-04-19 ENCOUNTER — Ambulatory Visit (INDEPENDENT_AMBULATORY_CARE_PROVIDER_SITE_OTHER): Payer: Medicare Other | Admitting: Family Medicine

## 2014-04-19 ENCOUNTER — Encounter: Payer: Self-pay | Admitting: Family Medicine

## 2014-04-19 VITALS — BP 138/84 | Temp 98.0°F | Wt 85.2 lb

## 2014-04-19 DIAGNOSIS — R7309 Other abnormal glucose: Secondary | ICD-10-CM

## 2014-04-19 DIAGNOSIS — R7303 Prediabetes: Secondary | ICD-10-CM

## 2014-04-19 DIAGNOSIS — R040 Epistaxis: Secondary | ICD-10-CM | POA: Diagnosis not present

## 2014-04-19 LAB — CBC WITH DIFFERENTIAL/PLATELET
BASOS ABS: 0.1 10*3/uL (ref 0.0–0.1)
Basophils Relative: 1 % (ref 0–1)
Eosinophils Absolute: 0.2 10*3/uL (ref 0.0–0.7)
Eosinophils Relative: 3 % (ref 0–5)
HCT: 43.4 % (ref 36.0–46.0)
Hemoglobin: 14.3 g/dL (ref 12.0–15.0)
LYMPHS PCT: 32 % (ref 12–46)
Lymphs Abs: 2.6 10*3/uL (ref 0.7–4.0)
MCH: 32.5 pg (ref 26.0–34.0)
MCHC: 32.9 g/dL (ref 30.0–36.0)
MCV: 98.6 fL (ref 78.0–100.0)
Monocytes Absolute: 0.6 10*3/uL (ref 0.1–1.0)
Monocytes Relative: 7 % (ref 3–12)
NEUTROS ABS: 4.6 10*3/uL (ref 1.7–7.7)
NEUTROS PCT: 57 % (ref 43–77)
PLATELETS: 315 10*3/uL (ref 150–400)
RBC: 4.4 MIL/uL (ref 3.87–5.11)
RDW: 14 % (ref 11.5–15.5)
WBC: 8.1 10*3/uL (ref 4.0–10.5)

## 2014-04-19 LAB — HEMOGLOBIN A1C
Hgb A1c MFr Bld: 6.8 % — ABNORMAL HIGH (ref <5.7)
MEAN PLASMA GLUCOSE: 148 mg/dL — AB (ref <117)

## 2014-04-19 LAB — PROTIME-INR
INR: 1.04 (ref <1.50)
PROTHROMBIN TIME: 13.6 s (ref 11.6–15.2)

## 2014-04-19 LAB — APTT: APTT: 30 s (ref 24–37)

## 2014-04-19 NOTE — Progress Notes (Signed)
   Subjective:    Patient ID: [name], female    DOB: 1928-08-08, 80 y.o.   MRN: [id number]  Epistaxis  The bleeding has been from the left nare. This is a recurrent problem. She has tried pressure for the symptoms. Her past medical history is significant for sinus problems.  Has seen Dr. Benjamine Mola in the past.  Pt also having headache and left ear pain.     Review of Systems  HENT: Positive for nosebleeds.    denies cough wheezing vomiting     Objective:   Physical Exam  Lungs are clear heart regular pulse normal eardrums normal throat is normal neck no masses nasal septum is raw on the left side with evidence of previous bleeding but no active bleeding      Assessment & Plan:  Epistaxis-saline nasal spray, Vaseline at night, lab work ordered, referral to ENT for cauterization, warning signs discussed regarding severe bleeds. Proper technique to minimize bleeding was discussed. ER visit if the extensive bleeding.

## 2014-04-20 ENCOUNTER — Telehealth: Payer: Self-pay | Admitting: *Deleted

## 2014-04-20 NOTE — Telephone Encounter (Signed)
May have diazepam and 4 refills. As for hydrocodone may have 50, 1 q6 hours prn pain use sparingly.

## 2014-04-20 NOTE — Telephone Encounter (Signed)
rx request from [facility] for diazepam 5mg  #90 one tid prn and hydrocodone 5/325 #60 one q 4 hours prn. Last seen 04/19/14

## 2014-04-21 ENCOUNTER — Ambulatory Visit (INDEPENDENT_AMBULATORY_CARE_PROVIDER_SITE_OTHER): Payer: Medicare Other | Admitting: Otolaryngology

## 2014-04-21 DIAGNOSIS — R040 Epistaxis: Secondary | ICD-10-CM | POA: Diagnosis not present

## 2014-04-21 MED ORDER — DIAZEPAM 5 MG PO TABS: 5.0000 mg | ORAL_TABLET | Freq: Three times a day (TID) | ORAL | Status: DC | PRN

## 2014-04-21 MED ORDER — HYDROCODONE-ACETAMINOPHEN 5-325 MG PO TABS: 1.0000 | ORAL_TABLET | ORAL | Status: DC | PRN

## 2014-04-21 NOTE — Telephone Encounter (Signed)
Rx up front for pick up. Pharmacy notified.

## 2014-04-26 ENCOUNTER — Telehealth: Payer: Self-pay | Admitting: Family Medicine

## 2014-04-26 ENCOUNTER — Other Ambulatory Visit: Payer: Self-pay | Admitting: *Deleted

## 2014-04-26 MED ORDER — CEFPROZIL 500 MG PO TABS: 500.0000 mg | ORAL_TABLET | Freq: Two times a day (BID) | ORAL | Status: DC

## 2014-04-26 NOTE — Telephone Encounter (Signed)
Pt.notified

## 2014-04-26 NOTE — Telephone Encounter (Signed)
No fever, coughing up yellow phelm, no wheezing, headache. Had sinus symptoms at last visit just getting worse.

## 2014-04-26 NOTE — Telephone Encounter (Signed)
pts CNA called to say that [name] is feeling bad, very stopped up  At this time, sinus infection is what she is sure she has. Says she spoke  To you about it at her last visit. Too weak to come in for a OV  Seen 04/19/14  [facility]

## 2014-04-26 NOTE — Telephone Encounter (Signed)
Med sent to [email or web address] to let pt know

## 2014-04-26 NOTE — Telephone Encounter (Signed)
Nurses, talk with family make sure patient is not in significant trouble. If difficulty breathing in the lungs or high fevers she often be seen otherwise Cefzil 250 mg 1 twice a day for 10 days followup if ongoing issues

## 2014-05-11 ENCOUNTER — Ambulatory Visit (INDEPENDENT_AMBULATORY_CARE_PROVIDER_SITE_OTHER): Payer: Medicare Other | Admitting: Family Medicine

## 2014-05-11 ENCOUNTER — Encounter: Payer: Self-pay | Admitting: Family Medicine

## 2014-05-11 VITALS — BP 110/80 | Ht 59.0 in | Wt 85.2 lb

## 2014-05-11 DIAGNOSIS — R918 Other nonspecific abnormal finding of lung field: Secondary | ICD-10-CM

## 2014-05-11 DIAGNOSIS — R7309 Other abnormal glucose: Secondary | ICD-10-CM | POA: Diagnosis not present

## 2014-05-11 DIAGNOSIS — Z23 Encounter for immunization: Secondary | ICD-10-CM | POA: Diagnosis not present

## 2014-05-11 DIAGNOSIS — E038 Other specified hypothyroidism: Secondary | ICD-10-CM | POA: Diagnosis not present

## 2014-05-11 DIAGNOSIS — G44021 Chronic cluster headache, intractable: Secondary | ICD-10-CM | POA: Diagnosis not present

## 2014-05-11 DIAGNOSIS — R7303 Prediabetes: Secondary | ICD-10-CM

## 2014-05-11 NOTE — Patient Instructions (Signed)
Fasting blood sugar finger stick once a week  If greater than 200 then you would need to call the office  We will recheck your A1C in 3 months  Diabetes Mellitus and Food It is important for you to manage your blood sugar (glucose) level. Your blood glucose level can be greatly affected by what you eat. Eating healthier foods in the appropriate amounts throughout the day at about the same time each day will help you control your blood glucose level. It can also help slow or prevent worsening of your diabetes mellitus. Healthy eating may even help you improve the level of your blood pressure and reach or maintain a healthy weight.  HOW CAN FOOD AFFECT ME? Carbohydrates Carbohydrates affect your blood glucose level more than any other type of food. Your dietitian will help you determine how many carbohydrates to eat at each meal and teach you how to count carbohydrates. Counting carbohydrates is important to keep your blood glucose at a healthy level, especially if you are using insulin or taking certain medicines for diabetes mellitus. Alcohol Alcohol can cause sudden decreases in blood glucose (hypoglycemia), especially if you use insulin or take certain medicines for diabetes mellitus. Hypoglycemia can be a life-threatening condition. Symptoms of hypoglycemia (sleepiness, dizziness, and disorientation) are similar to symptoms of having too much alcohol.  If your health care provider has given you approval to drink alcohol, do so in moderation and use the following guidelines:  Women should not have more than one drink per day, and men should not have more than two drinks per day. One drink is equal to:  12 oz of beer.  5 oz of wine.  1 oz of hard liquor.  Do not drink on an empty stomach.  Keep yourself hydrated. Have water, diet soda, or unsweetened iced tea.  Regular soda, juice, and other mixers might contain a lot of carbohydrates and should be counted. WHAT FOODS ARE NOT  RECOMMENDED? As you make food choices, it is important to remember that all foods are not the same. Some foods have fewer nutrients per serving than other foods, even though they might have the same number of calories or carbohydrates. It is difficult to get your body what it needs when you eat foods with fewer nutrients. Examples of foods that you should avoid that are high in calories and carbohydrates but low in nutrients include:  Trans fats (most processed foods list trans fats on the Nutrition Facts label).  Regular soda.  Juice.  Candy.  Sweets, such as cake, pie, doughnuts, and cookies.  Fried foods. WHAT FOODS CAN I EAT? Have nutrient-rich foods, which will nourish your body and keep you healthy. The food you should eat also will depend on several factors, including:  The calories you need.  The medicines you take.  Your weight.  Your blood glucose level.  Your blood pressure level.  Your cholesterol level. You also should eat a variety of foods, including:  Protein, such as meat, poultry, fish, tofu, nuts, and seeds (lean animal proteins are best).  Fruits.  Vegetables.  Dairy products, such as milk, cheese, and yogurt (low fat is best).  Breads, grains, pasta, cereal, rice, and beans.  Fats such as olive oil, trans fat-free margarine, canola oil, avocado, and olives. DOES EVERYONE WITH DIABETES MELLITUS HAVE THE SAME MEAL PLAN? Because every person with diabetes mellitus is different, there is not one meal plan that works for everyone. It is very important that you meet with a  dietitian who will help you create a meal plan that is just right for you. Document Released: 03/28/2005 Document Revised: 07/06/2013 Document Reviewed: 05/28/2013 [facility] Patient Information 2015 [place], [place]. This information is not intended to replace advice given to you by your health care provider. Make sure you discuss any questions you have with your health care provider.

## 2014-05-11 NOTE — Progress Notes (Signed)
   Subjective:    Patient ID: [name], female    DOB: 08/10/1928, 80 y.o.   MRN: [id number]  Hypertension This is a chronic problem. The current episode started more than 1 year ago. The problem has been gradually improving since onset. The problem is controlled. Pertinent negatives include no chest pain or shortness of breath. There are no associated agents to hypertension. There are no known risk factors for coronary artery disease. Treatments tried: metoprolol. The current treatment provides significant improvement. There are no compliance problems.    Patient's home health nurse states that patient has a fluid filled blister on her back between her shoulder blades.   Patient relates intermittent headaches intermittent low back pain uses hydrocodone for this denies abusing it  She states she is trying eat larger amounts to keep her weight. Previous CT scan of the chest was reviewed.    Review of Systems  Constitutional: Negative for activity change, appetite change and fatigue.  HENT: Negative for congestion.   Respiratory: Positive for cough. Negative for choking, chest tightness and shortness of breath.   Cardiovascular: Negative for chest pain.  Gastrointestinal: Negative for abdominal pain.  Endocrine: Negative for polydipsia and polyphagia.  Genitourinary: Negative for frequency.  Musculoskeletal: Positive for arthralgias and back pain.  Neurological: Negative for weakness.  Psychiatric/Behavioral: Negative for confusion.       Objective:   Physical Exam  Vitals reviewed. Constitutional: She appears well-nourished. No distress.  Cardiovascular: Normal rate, regular rhythm and normal heart sounds.   No murmur heard. Pulmonary/Chest: Effort normal and breath sounds normal. No respiratory distress.  Musculoskeletal: She exhibits no edema.  Lymphadenopathy:    She has no cervical adenopathy.  Neurological: She is alert. She exhibits normal muscle tone.  Psychiatric:  Her behavior is normal.          Assessment & Plan:  Hypertension blood pressure stable.  Lipoma noted on left upper back I don't don't feel that this is a cyst on Felix infection we will follow this up again in 3-4 months  Chronic headaches and back pain she may use hydrocodone as needed but it was encouraged not to take multiple tablets per day.  Pulmonary status stable.  Reflux status

## 2014-05-17 ENCOUNTER — Other Ambulatory Visit: Payer: Self-pay | Admitting: Family Medicine

## 2014-05-17 ENCOUNTER — Other Ambulatory Visit: Payer: Self-pay | Admitting: *Deleted

## 2014-05-19 ENCOUNTER — Ambulatory Visit (INDEPENDENT_AMBULATORY_CARE_PROVIDER_SITE_OTHER): Payer: Medicare Other | Admitting: Family Medicine

## 2014-05-19 ENCOUNTER — Telehealth: Payer: Self-pay | Admitting: Family Medicine

## 2014-05-19 ENCOUNTER — Ambulatory Visit (INDEPENDENT_AMBULATORY_CARE_PROVIDER_SITE_OTHER): Payer: Medicare Other | Admitting: Otolaryngology

## 2014-05-19 ENCOUNTER — Ambulatory Visit (HOSPITAL_COMMUNITY)
Admission: RE | Admit: 2014-05-19 | Discharge: 2014-05-19 | Disposition: A | Discharge status: Home / Self Care | Payer: Medicare Other | Source: Ambulatory Visit | Attending: Family Medicine | Admitting: Family Medicine

## 2014-05-19 VITALS — Temp 99.0°F | Ht 59.0 in | Wt 83.0 lb

## 2014-05-19 DIAGNOSIS — J189 Pneumonia, unspecified organism: Secondary | ICD-10-CM | POA: Diagnosis not present

## 2014-05-19 DIAGNOSIS — R509 Fever, unspecified: Secondary | ICD-10-CM | POA: Insufficient documentation

## 2014-05-19 DIAGNOSIS — R0989 Other specified symptoms and signs involving the circulatory and respiratory systems: Secondary | ICD-10-CM | POA: Insufficient documentation

## 2014-05-19 DIAGNOSIS — J984 Other disorders of lung: Secondary | ICD-10-CM | POA: Diagnosis not present

## 2014-05-19 LAB — POCT URINALYSIS DIPSTICK
PROTEIN UA: POSITIVE
SPEC GRAV UA: 1.015
pH, UA: 5

## 2014-05-19 MED ORDER — CEFTRIAXONE SODIUM 1 G IJ SOLR
1.0000 g | Freq: Once | INTRAMUSCULAR | Status: AC
  Administered 2014-05-19: 1 g via INTRAMUSCULAR

## 2014-05-19 MED ORDER — CEFPROZIL 500 MG PO TABS: 500.0000 mg | ORAL_TABLET | Freq: Two times a day (BID) | ORAL | Status: DC

## 2014-05-19 MED ORDER — CEFTRIAXONE SODIUM 1 G IJ SOLR: 1.0000 g | INTRAMUSCULAR | Status: DC

## 2014-05-19 NOTE — Telephone Encounter (Signed)
Patient told to come in at 2:15 for an appointment. Patient verbalized understanding.

## 2014-05-19 NOTE — Progress Notes (Signed)
   Subjective:    Patient ID: [name], female    DOB: 1928/08/04, 80 y.o.   MRN: [id number]  HPI Patient presents today because of fever chills not feeling good a little bit of runny nose according to the caretaker she's been coughing off and on for the past few days. In addition to this there was concern for the possibility of UTI she is given urine specimen today she does have a history of pulmonary infections as well as a benign growth The patient did not want to go to the hospital. We had to convince her thoroughly to come here today.  she relates she feels weak over the past couple days causing fever and chills Temperature is actually okay currently at 98.9/99  Review of Systems See above.    Objective:   Physical Exam  Eardrums normal throat is normal mucous murmurs moist neck no masses lungs are crackle on the right side left side clear heart regular extremities no edema skin warm dry      Assessment & Plan:  Febrile illness respiratory illness probable early pneumonia patient does not 1 be in the hospital Rocephin 1 g along with antibiotics prescribed plus follow-up in 24 hours if worse will need to go to the ER.  Chest x-ray was ordered. It did show that there is aincrease in the solid mass that was felt to be benign by the specialists. Her last scan was back in January. We will set up another CAT scan of the chest. This does not have to be done emergently. It could be done anywhere in the next 2 weeks. The patient husband was called regarding this.I made it clear to him that if she got worse through the night she needs to go to the emergency department.

## 2014-05-19 NOTE — Telephone Encounter (Signed)
She has fever, shakey, no abd pain, no head pain, does have the chills Was high temp but low grade fever now   [name]

## 2014-05-20 ENCOUNTER — Ambulatory Visit (INDEPENDENT_AMBULATORY_CARE_PROVIDER_SITE_OTHER): Payer: Medicare Other | Admitting: Family Medicine

## 2014-05-20 ENCOUNTER — Encounter: Payer: Self-pay | Admitting: Family Medicine

## 2014-05-20 VITALS — BP 108/70 | Temp 98.1°F | Ht 59.0 in | Wt 83.0 lb

## 2014-05-20 DIAGNOSIS — R0989 Other specified symptoms and signs involving the circulatory and respiratory systems: Secondary | ICD-10-CM

## 2014-05-20 DIAGNOSIS — R918 Other nonspecific abnormal finding of lung field: Secondary | ICD-10-CM

## 2014-05-20 MED ORDER — CEFTRIAXONE SODIUM 1 G IJ SOLR
500.0000 mg | Freq: Once | INTRAMUSCULAR | Status: AC
  Administered 2014-05-20: 500 mg via INTRAMUSCULAR

## 2014-05-20 NOTE — Progress Notes (Signed)
   Subjective:    Patient ID: [name], female    DOB: Jul 13, 1929, 80 y.o.   MRN: [id number]  HPI Patient is here today for a recheck on yesterday's visit with Dr. Nicki Reaper.  She received a Rocephin inj yesterday.  Had xray done.  Has not started the antibiotics yet.  Pt states she feels a lot better today.   No high fever no chills. Energy level improving. Starting to get appetite back.  See prior note.  Review of Systems    no vomiting no diarrhea no chills no rash ROS otherwise negative Objective:   Physical Exam Alert hydration good talkative H&T mom his condition lungs no wheezes no tachypnea heart regular rate and rhythm.       Assessment & Plan:  #1 impression resolving pneumonia patient request one more shot #2 chest mass per x-ray.Dr. Nicki Reaper discussed with family yesterday need to press on with CT scan. Plan CT of chest rationale discussed. One more Rocephin injection. Start oral antibiotics tomorrow. Warning signs discussed. Further recommendations based on scan. WSL

## 2014-05-25 ENCOUNTER — Other Ambulatory Visit: Payer: Self-pay | Admitting: *Deleted

## 2014-05-25 DIAGNOSIS — R918 Other nonspecific abnormal finding of lung field: Secondary | ICD-10-CM

## 2014-05-27 ENCOUNTER — Ambulatory Visit (HOSPITAL_COMMUNITY): Admission: RE | Admit: 2014-05-27 | Payer: Medicare Other | Source: Ambulatory Visit

## 2014-05-27 ENCOUNTER — Ambulatory Visit (HOSPITAL_COMMUNITY)
Admission: RE | Admit: 2014-05-27 | Discharge: 2014-05-27 | Disposition: A | Discharge status: Home / Self Care | Payer: Medicare Other | Source: Ambulatory Visit | Attending: Family Medicine | Admitting: Family Medicine

## 2014-05-27 DIAGNOSIS — R0989 Other specified symptoms and signs involving the circulatory and respiratory systems: Secondary | ICD-10-CM | POA: Diagnosis not present

## 2014-05-27 DIAGNOSIS — R05 Cough: Secondary | ICD-10-CM | POA: Insufficient documentation

## 2014-05-27 DIAGNOSIS — J189 Pneumonia, unspecified organism: Secondary | ICD-10-CM | POA: Insufficient documentation

## 2014-05-27 DIAGNOSIS — J181 Lobar pneumonia, unspecified organism: Secondary | ICD-10-CM | POA: Diagnosis not present

## 2014-06-01 ENCOUNTER — Ambulatory Visit (INDEPENDENT_AMBULATORY_CARE_PROVIDER_SITE_OTHER): Payer: Medicare Other | Admitting: Family Medicine

## 2014-06-01 ENCOUNTER — Encounter: Payer: Self-pay | Admitting: Family Medicine

## 2014-06-01 VITALS — BP 118/88 | Ht 59.0 in | Wt 87.0 lb

## 2014-06-01 DIAGNOSIS — R0989 Other specified symptoms and signs involving the circulatory and respiratory systems: Secondary | ICD-10-CM | POA: Diagnosis not present

## 2014-06-01 NOTE — Progress Notes (Signed)
   Subjective:    Patient ID: [name], female    DOB: [birth year], 80 y.o.   MRN: [id number]  HPI Patient is here today to go over the results of her CT scan of her chest.  She said she is feeling much better.   Please see previous note result of CAT scan gone over in detail patient states she's feeling better eating better energy better   Review of Systems    she denies fever she does state little bit of coughing denies nausea vomiting diarrhea Objective:   Physical Exam  Lungs are clear hearts regular pulse normal her weight is now up which is good      Assessment & Plan:  This patient pneumonia is improving.  I am concerned about the findings on her CAT scan. I really doubt there is anything that can be done at this point. I do believe it is reasonable to get consultation with pulmonology to see if further studies are warranted. It is quite possible they will state it is not  Patient follow-up in 3 months

## 2014-06-15 ENCOUNTER — Other Ambulatory Visit: Payer: Self-pay | Admitting: Family Medicine

## 2014-06-20 ENCOUNTER — Ambulatory Visit (INDEPENDENT_AMBULATORY_CARE_PROVIDER_SITE_OTHER): Payer: Medicare Other | Admitting: Internal Medicine

## 2014-06-20 ENCOUNTER — Encounter (INDEPENDENT_AMBULATORY_CARE_PROVIDER_SITE_OTHER): Payer: Self-pay | Admitting: Internal Medicine

## 2014-06-20 VITALS — BP 100/66 | HR 64 | Temp 98.4°F | Resp 16 | Ht 59.0 in | Wt 82.3 lb

## 2014-06-20 DIAGNOSIS — K9411 Enterostomy hemorrhage: Secondary | ICD-10-CM | POA: Diagnosis not present

## 2014-06-20 DIAGNOSIS — R634 Abnormal weight loss: Secondary | ICD-10-CM | POA: Diagnosis not present

## 2014-06-20 DIAGNOSIS — K9413 Enterostomy malfunction: Secondary | ICD-10-CM | POA: Diagnosis not present

## 2014-06-20 DIAGNOSIS — K3184 Gastroparesis: Secondary | ICD-10-CM | POA: Diagnosis not present

## 2014-06-20 NOTE — Patient Instructions (Signed)
Physician will call with results of blood work. Do not take vitamin E. Try domperidone at 5 mg twice daily and see if it's as good as 10 mg Consultation with ostomy nurse to be arranged

## 2014-06-20 NOTE — Progress Notes (Signed)
Presenting complaint;  Bleeding into the ileostomy and bloating. Having issues with ileostomy bag.  Subjective:  Patient is 80 year old Caucasian female who is here for scheduled visit accompanied by dorrie who is her CNA. She was last seen 3 months ago and was given prescription for Xifaxan. She did not call with a progress report as recommended. She says she is having much less bloating. Her appetite is fair. She eats multiple small meals. If she tries to eat regular meals she gets full quickly. Since her last visit she has passed some stool per rectum. She also complains of passing blood and clots into ileostomy yesterday and she saw some blood today. She denies abdominal pain nausea or vomiting. She is also having problems with ileostomy and unable to keep back in optimal position. She also complains of knot below the ileostomy.   Current Medications: Outpatient Encounter Prescriptions as of 06/20/2014  Medication Sig  . acetaminophen (TYLENOL) 500 MG tablet Take 500 mg by mouth as needed for headache.  . alendronate (FOSAMAX) 70 MG tablet TAKE 1 TAB WEEKLY 30 MINS. BEFORE BREAKFAST WITH 8 OZ OF WATER FOR OSTEOPOROSIS.  Marland Kitchen ATROVENT HFA 17 MCG/ACT inhaler 2 PUFFS FOUR TIMES DAILY.  Marland Kitchen bismuth subsalicylate (PEPTO BISMOL) 262 MG chewable tablet Chew 524 mg by mouth as needed.    . diazepam (VALIUM) 5 MG tablet Take 1 tablet (5 mg total) by mouth 3 (three) times daily as needed.  Marland Kitchen HYDROcodone-acetaminophen (NORCO/VICODIN) 5-325 MG per tablet Take 1 tablet by mouth every 4 (four) hours as needed.  Marland Kitchen levothyroxine (SYNTHROID, LEVOTHROID) 25 MCG tablet TAKE 2 TABLETS ON MONDAY AND FRIDAY AND ONE TABLET ALL OTHER DAYS.  Marland Kitchen memantine (NAMENDA) 10 MG tablet TAKE 1 TABLET TWICE A DAY FOR DEMENTIA.  Marland Kitchen metoprolol tartrate (LOPRESSOR) 25 MG tablet TAKE (1) TABLET TWICE A DAY FOR HIGH BLOOD PRESSURE.  . ondansetron (ZOFRAN) 8 MG tablet TAKE 1/2 TABLET THREE TIMES DAILY AS NEEDED FOR NAUSEA  .  pantoprazole (PROTONIX) 40 MG tablet TAKE ONE TABLET DAILY FOR ACID REFLUX.  Marland Kitchen potassium chloride (K-DUR) 10 MEQ tablet TAKE (1) TABLET BY MOUTH ONCE DAILY.  Marland Kitchen PRESCRIPTION MEDICATION Take by mouth 2 (two) times daily. Domperidone 10 mg daily  . Pseudoephedrine-Acetaminophen (SINUS HEADACHE/NON-DROWSY PO) Take by mouth as needed.  . venlafaxine XR (EFFEXOR-XR) 150 MG 24 hr capsule TAKE 1 CAPSULE ONCE DAILY FOR DEPRESSION.  . rifaximin (XIFAXAN) 550 MG TABS tablet Take 1 tablet (550 mg total) by mouth 2 (two) times daily. (Patient not taking: Reported on 06/20/2014)  . [DISCONTINUED] citalopram (CELEXA) 10 MG tablet TAKE (1) TABLET BY MOUTH ONCE DAILY. (Patient not taking: Reported on 06/20/2014)     Objective: Blood pressure 100/66, pulse 64, temperature 98.4 F (36.9 C), temperature source Oral, resp. rate 16, height 4\' 11"  (1.499 m), weight 82 lb 4.8 oz (37.331 kg). Patient is alert and in no acute distress. Conjunctiva is pink. Sclera is nonicteric Oropharyngeal mucosa is normal. No neck masses or thyromegaly noted. Cardiac exam with regular rhythm normal S1 and S2. No murmur or gallop noted. Lungs are clear to auscultation. Abdomen is flat. Bowel sounds are normal. Abdomen is soft. Area around ileostomy bag was palpated and no nodules noted. Ileostomy bag is greenish yellowish stool. No LE edema or clubbing noted.  Labs/studies Results: H&H on 04/19/2014 was 14.3 and 43.4    Assessment:  #1. Bleeding into ileostomy possibly secondary to GI injury from Fosamax and vitamin D may be contribution. #2. Bloating secondary  to small intestinal bacterial overgrowth. He was treated with Xifaxan 3 months ago. #3. Gastroparesis. #4.  Ileostomy issues.   Plan:  Consultation with ostomy nurse. CBC. Discontinue vitamin E and Fosamax. Will ask Dr. [name] to consider other alternatives for treatment for osteoporosis. She possibly will need ileoscopy further evaluate GI  bleeding.

## 2014-06-21 LAB — CBC
HEMATOCRIT: 42.5 % (ref 36.0–46.0)
HEMOGLOBIN: 13.6 g/dL (ref 12.0–15.0)
MCH: 32.5 pg (ref 26.0–34.0)
MCHC: 32 g/dL (ref 30.0–36.0)
MCV: 101.4 fL — ABNORMAL HIGH (ref 78.0–100.0)
MPV: 11.8 fL (ref 9.4–12.4)
Platelets: 308 10*3/uL (ref 150–400)
RBC: 4.19 MIL/uL (ref 3.87–5.11)
RDW: 15.2 % (ref 11.5–15.5)
WBC: 9.3 10*3/uL (ref 4.0–10.5)

## 2014-07-13 ENCOUNTER — Other Ambulatory Visit: Payer: Self-pay | Admitting: Family Medicine

## 2014-07-13 DIAGNOSIS — J449 Chronic obstructive pulmonary disease, unspecified: Secondary | ICD-10-CM | POA: Diagnosis not present

## 2014-07-13 DIAGNOSIS — J470 Bronchiectasis with acute lower respiratory infection: Secondary | ICD-10-CM | POA: Diagnosis not present

## 2014-07-13 DIAGNOSIS — J189 Pneumonia, unspecified organism: Secondary | ICD-10-CM | POA: Diagnosis not present

## 2014-07-20 DIAGNOSIS — H40013 Open angle with borderline findings, low risk, bilateral: Secondary | ICD-10-CM | POA: Diagnosis not present

## 2014-07-20 DIAGNOSIS — H04123 Dry eye syndrome of bilateral lacrimal glands: Secondary | ICD-10-CM | POA: Diagnosis not present

## 2014-07-20 DIAGNOSIS — Z961 Presence of intraocular lens: Secondary | ICD-10-CM | POA: Diagnosis not present

## 2014-07-20 DIAGNOSIS — H43813 Vitreous degeneration, bilateral: Secondary | ICD-10-CM | POA: Diagnosis not present

## 2014-07-20 DIAGNOSIS — H02059 Trichiasis without entropian unspecified eye, unspecified eyelid: Secondary | ICD-10-CM | POA: Diagnosis not present

## 2014-08-02 ENCOUNTER — Other Ambulatory Visit: Payer: Self-pay | Admitting: Family Medicine

## 2014-08-02 NOTE — Telephone Encounter (Signed)
I approve this prescription for 35 tablets

## 2014-08-20 ENCOUNTER — Other Ambulatory Visit: Payer: Self-pay | Admitting: Family Medicine

## 2014-08-22 NOTE — Telephone Encounter (Signed)
Both medicines may be refill 4

## 2014-08-30 ENCOUNTER — Emergency Department (HOSPITAL_COMMUNITY): Payer: Medicare Other

## 2014-08-30 ENCOUNTER — Emergency Department (HOSPITAL_COMMUNITY)
Admission: EM | Admit: 2014-08-30 | Discharge: 2014-08-31 | Disposition: A | Discharge status: Home / Self Care | Payer: Medicare Other | Attending: Emergency Medicine | Admitting: Emergency Medicine

## 2014-08-30 ENCOUNTER — Encounter (HOSPITAL_COMMUNITY): Payer: Self-pay | Admitting: Emergency Medicine

## 2014-08-30 ENCOUNTER — Telehealth: Payer: Self-pay | Admitting: *Deleted

## 2014-08-30 DIAGNOSIS — K219 Gastro-esophageal reflux disease without esophagitis: Secondary | ICD-10-CM | POA: Diagnosis not present

## 2014-08-30 DIAGNOSIS — Z87448 Personal history of other diseases of urinary system: Secondary | ICD-10-CM | POA: Insufficient documentation

## 2014-08-30 DIAGNOSIS — Z862 Personal history of diseases of the blood and blood-forming organs and certain disorders involving the immune mechanism: Secondary | ICD-10-CM | POA: Diagnosis not present

## 2014-08-30 DIAGNOSIS — Z79899 Other long term (current) drug therapy: Secondary | ICD-10-CM | POA: Insufficient documentation

## 2014-08-30 DIAGNOSIS — Z8679 Personal history of other diseases of the circulatory system: Secondary | ICD-10-CM | POA: Insufficient documentation

## 2014-08-30 DIAGNOSIS — Z8739 Personal history of other diseases of the musculoskeletal system and connective tissue: Secondary | ICD-10-CM | POA: Insufficient documentation

## 2014-08-30 DIAGNOSIS — K589 Irritable bowel syndrome without diarrhea: Secondary | ICD-10-CM | POA: Diagnosis not present

## 2014-08-30 DIAGNOSIS — R4182 Altered mental status, unspecified: Secondary | ICD-10-CM | POA: Diagnosis not present

## 2014-08-30 DIAGNOSIS — J984 Other disorders of lung: Secondary | ICD-10-CM | POA: Diagnosis not present

## 2014-08-30 LAB — CBC WITH DIFFERENTIAL/PLATELET
BASOS ABS: 0 10*3/uL (ref 0.0–0.1)
Basophils Relative: 0 % (ref 0–1)
EOS ABS: 0.1 10*3/uL (ref 0.0–0.7)
Eosinophils Relative: 1 % (ref 0–5)
HCT: 41.7 % (ref 36.0–46.0)
HEMOGLOBIN: 13.8 g/dL (ref 12.0–15.0)
LYMPHS PCT: 16 % (ref 12–46)
Lymphs Abs: 2 10*3/uL (ref 0.7–4.0)
MCH: 33.2 pg (ref 26.0–34.0)
MCHC: 33.1 g/dL (ref 30.0–36.0)
MCV: 100.2 fL — ABNORMAL HIGH (ref 78.0–100.0)
Monocytes Absolute: 1.1 10*3/uL — ABNORMAL HIGH (ref 0.1–1.0)
Monocytes Relative: 9 % (ref 3–12)
NEUTROS ABS: 8.8 10*3/uL — AB (ref 1.7–7.7)
NEUTROS PCT: 74 % (ref 43–77)
Platelets: 238 10*3/uL (ref 150–400)
RBC: 4.16 MIL/uL (ref 3.87–5.11)
RDW: 14.4 % (ref 11.5–15.5)
WBC: 12 10*3/uL — ABNORMAL HIGH (ref 4.0–10.5)

## 2014-08-30 LAB — COMPREHENSIVE METABOLIC PANEL
ALBUMIN: 3.9 g/dL (ref 3.5–5.2)
ALK PHOS: 120 U/L — AB (ref 39–117)
ALT: 16 U/L (ref 0–35)
AST: 21 U/L (ref 0–37)
Anion gap: 9 (ref 5–15)
BUN: 14 mg/dL (ref 6–23)
CO2: 29 mmol/L (ref 19–32)
Calcium: 9.6 mg/dL (ref 8.4–10.5)
Chloride: 99 mmol/L (ref 96–112)
Creatinine, Ser: 1 mg/dL (ref 0.50–1.10)
GFR calc non Af Amer: 51 mL/min — ABNORMAL LOW (ref >90)
GFR, EST AFRICAN AMERICAN: 59 mL/min — AB (ref >90)
GLUCOSE: 165 mg/dL — AB (ref 70–99)
Potassium: 4.1 mmol/L (ref 3.5–5.1)
Sodium: 137 mmol/L (ref 135–145)
TOTAL PROTEIN: 7.5 g/dL (ref 6.0–8.3)
Total Bilirubin: 0.7 mg/dL (ref 0.3–1.2)

## 2014-08-30 LAB — URINALYSIS, ROUTINE W REFLEX MICROSCOPIC
BILIRUBIN URINE: NEGATIVE
Glucose, UA: NEGATIVE mg/dL
Ketones, ur: NEGATIVE mg/dL
Leukocytes, UA: NEGATIVE
Nitrite: NEGATIVE
Protein, ur: NEGATIVE mg/dL
SPECIFIC GRAVITY, URINE: 1.025 (ref 1.005–1.030)
Urobilinogen, UA: 0.2 mg/dL (ref 0.0–1.0)
pH: 5.5 (ref 5.0–8.0)

## 2014-08-30 LAB — URINE MICROSCOPIC-ADD ON

## 2014-08-30 LAB — TROPONIN I: Troponin I: <0.03 ng/mL (ref <0.031)

## 2014-08-30 NOTE — Telephone Encounter (Signed)
[name]'s caregiver called to get appt for [name]. She has been confused for the past couple days. Worse today. She does not know what day it is, throwing stuff all over the house looking for things. States she is seeing people that are not there. Discussed with Dr. Nicki Reaper. Pt needs to go directly to the ED. Discussed with caregiver Mrs. Adah Perl and she agreed.

## 2014-08-30 NOTE — Discharge Instructions (Signed)
°Emergency Department Resource Guide °1) Find a Doctor and Pay Out of Pocket °Although you won't have to find out who is covered by your insurance plan, it is a good idea to ask around and get recommendations. You will then need to call the office and see if the doctor you have chosen will accept you as a new patient and what types of options they offer for patients who are self-pay. Some doctors offer discounts or will set up payment plans for their patients who do not have insurance, but you will need to ask so you aren't surprised when you get to your appointment. ° °2) Contact Your Local Health Department °Not all health departments have doctors that can see patients for sick visits, but many do, so it is worth a call to see if yours does. If you don't know where your local health department is, you can check in your phone book. The CDC also has a tool to help you locate your state's health department, and many state websites also have listings of all of their local health departments. ° °3) Find a Walk-in Clinic °If your illness is not likely to be very severe or complicated, you may want to try a walk in clinic. These are popping up all over the country in pharmacies, drugstores, and shopping centers. They're usually staffed by nurse practitioners or physician assistants that have been trained to treat common illnesses and complaints. They're usually fairly quick and inexpensive. However, if you have serious medical issues or chronic medical problems, these are probably not your best option. ° °No Primary Care Doctor: °- Call [facility] at  832-8000 - they can help you locate a primary care doctor that  accepts your insurance, provides certain services, etc. °- Physician Referral Service- 1-800-533-3463 ° °Chronic Pain Problems: °Organization         Address  Phone   Notes  °[facility]  (336) 297-2271 Patients need to be referred by their primary care doctor.  ° °Medication  Assistance: °Organization         Address  Phone   Notes  °[place] [facility] [address] °[place], [place] [address] (336) 641-8030 --Must be a resident of [place] °-- Must have NO insurance coverage whatsoever (no Medicaid/ Medicare, etc.) °-- The pt. MUST have a primary care doctor that directs their care regularly and follows them in the community °  °[facility]  (866) 331-1348   °[employer]  (888) 892-1162   ° °Agencies that provide inexpensive medical care: °Organization         Address  Phone   Notes  °[facility]  (336) 832-8035   °[facility]    (336) 832-7272   °[facility] [address] °[place], [place] [address] (336) 832-4777   °[facility] [address], °[place] (336) 271-4999   °[facility]    (336) 373-0678   °[facility]    (336) 272-1050   °[facility] ° [address], [place] Phone:  (336) 832-4444, Fax:  (336) 832-4440 Hours of Operation:  9 am - 6 pm, M-F.  Also accepts Medicaid/Medicare and self-pay.  °[facility] ° [address], [place] Phone: (336) 832-3150, Fax: (336) [phone]. Hours of Operation:  8:30 am - 5:30 pm, M-F.  Also accepts Medicaid and self-pay.  °[facility] [place] [address], [place] Phone: (336) 878-6027   °[facility] [address], [place], [place] (336)723-1848, Ext. 123 Mondays & Thursdays: 7-9 AM.  First 15 patients are seen on a first come, first serve basis. °  ° °Medicaid-accepting [place] Providers: ° °Organization         Address  Phone   Notes  °[facility] [address], [place] (336) 641-2100 Also accepts self-pay patients.  °[facility] [address], [place] ° (336) 856-9996   °[facility] [address], [place]  (336) 288-8857   °[facility] [address] [place] [address], [place] (336) 299-7000   °Veita Bland [address], [place]  ° (336) 373-1557 Only accepts  Access Medicaid patients after they have their name applied to their card.  ° °Self-Pay (no insurance) in [place]: ° °Organization         Address  Phone   Notes  °Sickle Cell Patients, [facility] [address], [place] (336) 832-1970   °[facility] [address], [place] (336) 832-4400   ° [facility] ° [address], [place] (336) 992-4800   °[facility]/Dr. Osei-Bonsu ° [address] [place] [address], [place] or [address], [place] (336) [phone] Phone number for both [place] and [place] locations is the same.  °[facility] [address], [place] (336) 299-0000   °[facility] [address] [place] [address], [place] or [address] (336) 852-7530 °(336) 878-2260   °[facility] [address], [place] (336) 350-1642, phone; (336) 294-5005, fax Sees patients 1st and 3rd Saturday of every month.  Must not qualify for public or private insurance (i.e. Medicaid, Medicare, [place] Health Choice, Veterans' Benefits) • Household income should be no more than 200% of the poverty level •The clinic cannot treat you if you are pregnant or think you are pregnant • Sexually transmitted diseases are not treated at the clinic.  ° ° °Dental Care: °Organization         Address  Phone  Notes  °[place] [facility] [address], [place] (336) 641-6152 Accepts children up to age 21 who are enrolled in Medicaid or [place] Health Choice; pregnant women with a Medicaid card; and children who have applied for Medicaid or [place] Health Choice, but were declined, whose parents can pay a reduced fee at time of service.  °[place]  [facility] [place]  [address], [place] (336) 641-7733 Accepts children up to age 21 who are enrolled in Medicaid or [place] Health Choice; pregnant women with a Medicaid card; and children who have applied for Medicaid or [place] Health Choice, but were declined, whose parents can pay a reduced fee at time of service.  °[facility] ° [address], [place] (336) 641-4533 Patients are seen by appointment only. Walk-ins are not accepted. [facility] will see patients 18 years of age and older. °Monday - Tuesday (8am-5pm) °Most Wednesdays (8:30-5pm) °$30 per visit, cash only  °[facility] ° [address], [place] (336) 641-4533 Patients are seen by appointment only. Walk-ins are not accepted. [facility] will see patients 18 years of age and older. °One   Wednesday Evening (Monthly: Volunteer Based).  $30 per visit, cash only  °[facility]  (919) 537-3737 for adults; Children under age 4, call Graduate Pediatric Dentistry at (919) [phone]. Children aged 4-14, please call (919) [phone] to request a pediatric application. ° Dental services are provided in all areas of dental care including fillings, crowns and bridges, complete and partial dentures, implants, gum treatment, root canals, and extractions. Preventive care is also provided. Treatment is provided to both adults and children. °Patients are selected via a lottery and there is often a waiting list. °  °[facility] [address], °[place] ° (336) 763-8833 [email or web address] °  °[facility] [address], [place], [place] (336)723-1848, Ext. 123 Second and Fourth Thursday of each month, opens at 6:30 AM; Clinic ends at 9 AM.  Patients are seen on a first-come first-served basis, and a limited number are seen during each clinic.  ° °[facility] ° [address], [place], [place] (336) 723-7904    Eligibility Requirements °You must have lived in [place], or [place] counties for at least the last three months. °  You cannot be eligible for state or federal sponsored healthcare insurance, including Veterans Administration, Medicaid, or Medicare. °  You generally cannot be eligible for healthcare insurance through your employer.  °  How to apply: °Eligibility screenings are held every Tuesday and Wednesday afternoon from 1:00 pm until 4:00 pm. You do not need an appointment for the interview!  °[facility] [address], [place], [place] 336-631-2330   °[facility]  336-342-8273   °[facility]  336-703-3100   °[facility]  336-570-6415   ° °Behavioral Health Resources in the Community: °Intensive Outpatient Programs °Organization         Address  Phone  Notes  °[facility] [address], [place], [place] 336-878-6098   °[facility] [address], [place], [place] 336-832-9800   °[facility] [address], [place], [place] ° 336-882-2125   °[facility] [address],  °[place], [place] 1-800-853-5163 or 336-641-4981   °Substance Abuse Resources °Organization         Address  Phone  Notes  °[facility]  336-882-2125   °[facility]  336-784-9470   °[facility]  336-285-9073   °[facility]  336-845-3988   °Residential & Outpatient Substance Abuse Program  1-800-659-3381   °Psychological Services °Organization         Address  Phone  Notes  °[facility]  336- 832-9600   °[facility]  336- 378-7881   °[facility] [address], [place] 1-800-853-5163 or 336-641-4981   ° °Mobile Crisis Teams °Organization         Address  Phone  Notes  °[facility], Mobile Crisis Care Unit  1-877-626-1772   °Assertive °[facility] ° [address]  [place], [place] 336-834-9664   °Sharon DeEsch [address] °[place]  336-554-5454   ° °Self-Help/Support Groups °Organization         Address  Phone             Notes  °[facility] [place] - variety of support groups  336- 373-1402 Call for more information  °Narcotics Anonymous (NA), [facility] [address], °[place]   2 meetings at this location  ° °  Residential Treatment Programs Organization         Address  Phone  Notes  [facility] [address],    [place]  1-(365)629-3731   [facility]  [address] [place] [address], [place], [place]   [place], [place] [phone] Admissions: 8am-3pm M-F  [facility] [place] [address]    [place], [place] 353-299-2426   [facility] [address] [place], [place], [place]   [facility] [address]  [place], [place]   [facility] Intensive Outpatient [address], [place], [place]   [facility] [place]  [place], [place] 1-386-056-9146 or (330) 319-3110   [facility] [address], [place], [place] Accepts Medicaid  [facility] [address]  [place] 1-(714) 313-5061 Substance Abuse/Addiction Treatment   [facility]         Address  Phone  Notes  [facility]  208-390-0603   Domenic Schwab, PhD [address] [place], [place]   902-129-1000 or 3305498027   [place], [place] [phone]   [facility] [address] [place], [place] [phone] Insurance/Medicaid/sponsorship through [facility] and Families [address]                                    [place], [place] [phone] [place] [address], [place] [phone]    Dr. Adele Schilder  ([phone]) [phone]   [facility] Dept. 1) [address] [place] 2) [place] 3)  [address], [place] [phone]  [phone]   [place] [phone] or [phone] (After Hours)      Take your usual prescriptions as previously directed.  Call your regular medical doctor in the morning to schedule a follow up appointment within the next 2 days.  Return to the Emergency Department immediately sooner if worsening.

## 2014-08-30 NOTE — ED Notes (Signed)
PT family reports altered mental status with hallucinations x1 week and not sleeping well at night. PT denies any pain at this time. PT disoriented to time but oriented to person and place.

## 2014-08-31 NOTE — ED Provider Notes (Signed)
CSN: [id number]     Arrival date & time 08/30/14  1732 History   First MD Initiated Contact with Patient 08/30/14 2144     Chief Complaint  Patient presents with  . Altered Mental Status      Patient is a 81 y.o. female presenting with altered mental status. The history is provided by a caregiver, a relative and the patient. The history is limited by the condition of the patient (AMS).  Altered Mental Status Pt was seen at 2210.  Per pt's caregiver, family and pt: c/o pt with gradual onset and worsening of persistent AMS for the past 5 days. Pt's caregiver states pt "is talking to people that aren't there" and "not sleeping well at night." Denies any specific CP/SOB, no cough, no abd pain, no N/V/D, no falls, no focal motor weakness, no syncope.    Past Medical History  Diagnosis Date  . Lower abdominal pain   . Gastroesophageal reflux disease   . IBS (irritable bowel syndrome)   . Migraines   . GAD (generalized anxiety disorder)   . Anemia   . Osteoporosis   . Renal insufficiency   . Tachycardia    Past Surgical History  Procedure Laterality Date  . Stomach surgery for a bleeding ulcer when she was in her 38s    . Gastrectomy    . Tonsillectomy    . Appendectomy    . Cholecystectomy    . Abdominal hysterectomy    . Esophagogastroduodenoscopy     Family History  Problem Relation Age of Onset  . Stroke Mother   . Stroke Father   . Diabetes Sister   . Diabetes Brother   . Stroke Brother   . Lung cancer Sister   . Diabetes Sister   . Emphysema Sister   . Diabetes Brother   . Stroke Brother   . Diabetes Son   . Irritable bowel syndrome Son   . Diabetes Son   . Hypertension Son    History  Substance Use Topics  . Smoking status: Never Smoker   . Smokeless tobacco: Never Used  . Alcohol Use: No    Review of Systems  Unable to perform ROS: Mental status change      Allergies  Tetanus toxoid; Zolpidem tartrate; and Benadryl  Home Medications   Prior to  Admission medications   Medication Sig Start Date End Date Taking? Authorizing Provider  acetaminophen (TYLENOL) 500 MG tablet Take 500 mg by mouth daily as needed for headache.    Yes Historical Provider, MD  ATROVENT HFA 17 MCG/ACT inhaler 2 PUFFS FOUR TIMES DAILY. Patient taking differently: TAKE TWO PUFFS VIA INHALATION TWICE DAILY 08/23/14  Yes Kathyrn Drown, MD  bismuth subsalicylate (PEPTO BISMOL) 262 MG/15ML suspension Take 30 mLs by mouth every 6 (six) hours as needed for indigestion or diarrhea or loose stools.   Yes Historical Provider, MD  diazepam (VALIUM) 5 MG tablet Take 1 tablet (5 mg total) by mouth 3 (three) times daily as needed. Patient taking differently: Take 5 mg by mouth 3 (three) times daily as needed for anxiety.  04/21/14  Yes Kathyrn Drown, MD  HYDROcodone-acetaminophen (NORCO/VICODIN) 5-325 MG per tablet TAKE (1) TABLET EVERY FOUR HOURS AS NEEDED. Patient taking differently: TAKE (1) TABLET EVERY FOUR HOURS AS NEEDED FOR PAIN 08/03/14  Yes Kathyrn Drown, MD  levothyroxine (SYNTHROID, LEVOTHROID) 25 MCG tablet TAKE 2 TABLETS ON MONDAY AND FRIDAY AND ONE TABLET ALL OTHER DAYS. 06/15/14  Yes Scott A  Luking, MD  memantine (NAMENDA) 10 MG tablet TAKE 1 TABLET TWICE A DAY FOR DEMENTIA. 05/17/14  Yes Kathyrn Drown, MD  metoprolol tartrate (LOPRESSOR) 25 MG tablet TAKE (1) TABLET TWICE A DAY FOR HIGH BLOOD PRESSURE. 07/13/14  Yes Kathyrn Drown, MD  nystatin (MYCOSTATIN) 100000 UNIT/ML suspension TAKE AS DIRECTED 08/23/14  Yes Kathyrn Drown, MD  ondansetron (ZOFRAN) 8 MG tablet TAKE 1/2 TABLET THREE TIMES DAILY AS NEEDED FOR NAUSEA 12/14/12  Yes Kathyrn Drown, MD  pantoprazole (PROTONIX) 40 MG tablet TAKE ONE TABLET DAILY FOR ACID REFLUX. Patient taking differently: TAKE ONE TO TWO TABLETS DAILY AS NEEDED FOR ACID REFLUX. 05/17/14  Yes Kathyrn Drown, MD  potassium chloride (K-DUR) 10 MEQ tablet TAKE (1) TABLET BY MOUTH ONCE DAILY. 04/18/14  Yes Kathyrn Drown, MD  PRESCRIPTION  MEDICATION Take 10 mg by mouth 4 (four) times daily. Domperidone 10 mg daily   Yes Historical Provider, MD  venlafaxine XR (EFFEXOR-XR) 150 MG 24 hr capsule TAKE 1 CAPSULE ONCE DAILY FOR DEPRESSION. 05/17/14  Yes Kathyrn Drown, MD   BP 149/58 mmHg  Pulse 90  Temp(Src) 99.9 F (37.7 C) (Oral)  Resp 21  Ht 5' (1.524 m)  Wt 82 lb (37.195 kg)  BMI 16.01 kg/m2  SpO2 93% Physical Exam  2215: Physical examination:  Nursing notes reviewed; Vital signs and O2 SAT reviewed;  Constitutional: Thin. Well hydrated, In no acute distress; Head:  Normocephalic, atraumatic; Eyes: EOMI, PERRL, No scleral icterus; ENMT: Mouth and pharynx normal, Mucous membranes moist; Neck: Supple, Full range of motion, No lymphadenopathy; Cardiovascular: Regular rate and rhythm, No gallop; Respiratory: Breath sounds clear & equal bilaterally, No wheezes.  Speaking full sentences with ease, Normal respiratory effort/excursion; Chest: Nontender, Movement normal; Abdomen: Soft, Nontender, Nondistended, Normal bowel sounds; Genitourinary: No CVA tenderness; Extremities: Pulses normal, No tenderness, No edema, No calf edema or asymmetry.; Neuro: Awake, alert, confused re: events. Major CN grossly intact.  No facial droop. Speech clear. Grips equal. Strength 5/5 equal bilat UE's and LE's. Moves all extremities spontaneously and to command without apparent gross focal motor deficits.; Skin: Color normal, Warm, Dry.   ED Course  Procedures     EKG Interpretation   Date/Time:  Tuesday August 30 2014 22:06:09 EST Ventricular Rate:  92 PR Interval:  197 QRS Duration: 126 QT Interval:  411 QTC Calculation: 508 R Axis:   102 Text Interpretation:  Sinus rhythm RAE, consider biatrial enlargement  Nonspecific intraventricular conduction delay Anteroseptal infarct , age  undetermined Lateral infarct , age undetermined Nonspecific ST and T wave  abnormality When compared with ECG of 09/14/2010 No significant change was  found  Confirmed by [facility]  MD, Nunzio Cory [phone]) on 08/30/2014 10:54:20 PM      MDM  MDM Reviewed: previous chart, nursing note and vitals Reviewed previous: labs and ECG Interpretation: labs, ECG, x-ray and CT scan     Results for orders placed or performed during the hospital encounter of 08/30/14  CBC with Differential  Result Value Ref Range   WBC 12.0 (H) 4.0 - 10.5 K/uL   RBC 4.16 3.87 - 5.11 MIL/uL   Hemoglobin 13.8 12.0 - 15.0 g/dL   HCT 41.7 36.0 - 46.0 %   MCV 100.2 (H) 78.0 - 100.0 fL   MCH 33.2 26.0 - 34.0 pg   MCHC 33.1 30.0 - 36.0 g/dL   RDW 14.4 11.5 - 15.5 %   Platelets 238 150 - 400 K/uL   Neutrophils Relative %  74 43 - 77 %   Neutro Abs 8.8 (H) 1.7 - 7.7 K/uL   Lymphocytes Relative 16 12 - 46 %   Lymphs Abs 2.0 0.7 - 4.0 K/uL   Monocytes Relative 9 3 - 12 %   Monocytes Absolute 1.1 (H) 0.1 - 1.0 K/uL   Eosinophils Relative 1 0 - 5 %   Eosinophils Absolute 0.1 0.0 - 0.7 K/uL   Basophils Relative 0 0 - 1 %   Basophils Absolute 0.0 0.0 - 0.1 K/uL  Comprehensive metabolic panel  Result Value Ref Range   Sodium 137 135 - 145 mmol/L   Potassium 4.1 3.5 - 5.1 mmol/L   Chloride 99 96 - 112 mmol/L   CO2 29 19 - 32 mmol/L   Glucose, Bld 165 (H) 70 - 99 mg/dL   BUN 14 6 - 23 mg/dL   Creatinine, Ser 1.00 0.50 - 1.10 mg/dL   Calcium 9.6 8.4 - 10.5 mg/dL   Total Protein 7.5 6.0 - 8.3 g/dL   Albumin 3.9 3.5 - 5.2 g/dL   AST 21 0 - 37 U/L   ALT 16 0 - 35 U/L   Alkaline Phosphatase 120 (H) 39 - 117 U/L   Total Bilirubin 0.7 0.3 - 1.2 mg/dL   GFR calc non Af Amer 51 (L) >90 mL/min   GFR calc Af Amer 59 (L) >90 mL/min   Anion gap 9 5 - 15  Urinalysis, Routine w reflex microscopic  Result Value Ref Range   Color, Urine YELLOW YELLOW   APPearance CLEAR CLEAR   Specific Gravity, Urine 1.025 1.005 - 1.030   pH 5.5 5.0 - 8.0   Glucose, UA NEGATIVE NEGATIVE mg/dL   Hgb urine dipstick SMALL (A) NEGATIVE   Bilirubin Urine NEGATIVE NEGATIVE   Ketones, ur NEGATIVE  NEGATIVE mg/dL   Protein, ur NEGATIVE NEGATIVE mg/dL   Urobilinogen, UA 0.2 0.0 - 1.0 mg/dL   Nitrite NEGATIVE NEGATIVE   Leukocytes, UA NEGATIVE NEGATIVE  Troponin I  Result Value Ref Range   Troponin I <0.03 <0.031 ng/mL  Urine microscopic-add on  Result Value Ref Range   RBC / HPF 0-2 <3 RBC/hpf   Dg Chest 2 View 08/30/2014   CLINICAL DATA:  Altered mental status for 1 week. Shortness of breath. Right side chest pain.  EXAM: CHEST  2 VIEW  COMPARISON:  CT chest 05/27/2014.  PA and lateral chest 05/19/2014.  FINDINGS: Multiple calcifications and scar are seen in the left lung apex, unchanged. Linear opacity and retraction of the right hilum compatible with scarring in the right upper lobe appears unchanged. Heart size is upper normal. No pneumothorax or pleural effusion is identified.  IMPRESSION: No acute disease.  Bilateral upper lobe scarring.   Electronically Signed   By: Inge Rise M.D.   On: 08/30/2014 18:17   Ct Head Wo Contrast 08/30/2014   CLINICAL DATA:  Altered mental status for 1 week.  EXAM: CT HEAD WITHOUT CONTRAST  TECHNIQUE: Contiguous axial images were obtained from the base of the skull through the vertex without intravenous contrast.  COMPARISON:  None.  FINDINGS: Chronic microvascular ischemic change is identified. There is no evidence of acute intracranial abnormality including hemorrhage, infarct, mass lesion, mass effect, midline shift or abnormal extra-axial fluid collection. No hydrocephalus or pneumocephalus. The calvarium is intact. Mild mucosal thickening left sphenoid sinus is incidentally noted.  IMPRESSION: No acute abnormality.  Chronic microvascular ischemic change.   Electronically Signed   By: Inge Rise M.D.  On: 08/30/2014 22:50    2355:  Pt is not orthostatic on VS. Pt has eaten a full meal while in the ED without N/V. Pt does take namenda, question if pt does have hx of mild dementia at her baseline. EKG unchanged from previous and labs are  unremarkable; no clear indication for admission at this time.  T/C to [facility] Dr. Darrick Meigs, case discussed, including:  HPI, pertinent PM/SHx, VS/PE, dx testing, ED course and treatment:  Agrees there is no clear indication for admission at this time, and pt can be d/c to f/u with her PMD. Dx and testing, as well as d/w [facility] MD, d/w pt and family.  Questions answered.  Verb understanding, agreeable to d/c home with outpt f/u with her PMD on Thursday as previously scheduled.    Francine Graven, DO 09/02/14 [phone]

## 2014-09-01 ENCOUNTER — Ambulatory Visit: Payer: Medicare Other | Admitting: Family Medicine

## 2014-09-06 DIAGNOSIS — M542 Cervicalgia: Secondary | ICD-10-CM | POA: Diagnosis not present

## 2014-09-06 DIAGNOSIS — M5413 Radiculopathy, cervicothoracic region: Secondary | ICD-10-CM | POA: Diagnosis not present

## 2014-09-06 DIAGNOSIS — M9902 Segmental and somatic dysfunction of thoracic region: Secondary | ICD-10-CM | POA: Diagnosis not present

## 2014-09-06 DIAGNOSIS — M9901 Segmental and somatic dysfunction of cervical region: Secondary | ICD-10-CM | POA: Diagnosis not present

## 2014-09-08 ENCOUNTER — Other Ambulatory Visit: Payer: Self-pay | Admitting: Family Medicine

## 2014-09-08 DIAGNOSIS — M9901 Segmental and somatic dysfunction of cervical region: Secondary | ICD-10-CM | POA: Diagnosis not present

## 2014-09-08 DIAGNOSIS — M5413 Radiculopathy, cervicothoracic region: Secondary | ICD-10-CM | POA: Diagnosis not present

## 2014-09-08 DIAGNOSIS — M542 Cervicalgia: Secondary | ICD-10-CM | POA: Diagnosis not present

## 2014-09-08 DIAGNOSIS — M9902 Segmental and somatic dysfunction of thoracic region: Secondary | ICD-10-CM | POA: Diagnosis not present

## 2014-09-12 DIAGNOSIS — M9901 Segmental and somatic dysfunction of cervical region: Secondary | ICD-10-CM | POA: Diagnosis not present

## 2014-09-12 DIAGNOSIS — M542 Cervicalgia: Secondary | ICD-10-CM | POA: Diagnosis not present

## 2014-09-12 DIAGNOSIS — M5413 Radiculopathy, cervicothoracic region: Secondary | ICD-10-CM | POA: Diagnosis not present

## 2014-09-12 DIAGNOSIS — M9902 Segmental and somatic dysfunction of thoracic region: Secondary | ICD-10-CM | POA: Diagnosis not present

## 2014-09-13 ENCOUNTER — Ambulatory Visit (INDEPENDENT_AMBULATORY_CARE_PROVIDER_SITE_OTHER): Payer: Medicare Other | Admitting: Family Medicine

## 2014-09-13 ENCOUNTER — Encounter: Payer: Self-pay | Admitting: Family Medicine

## 2014-09-13 VITALS — BP 110/64 | Wt 79.2 lb

## 2014-09-13 DIAGNOSIS — R109 Unspecified abdominal pain: Secondary | ICD-10-CM | POA: Diagnosis not present

## 2014-09-13 DIAGNOSIS — R634 Abnormal weight loss: Secondary | ICD-10-CM | POA: Diagnosis not present

## 2014-09-13 DIAGNOSIS — D510 Vitamin B12 deficiency anemia due to intrinsic factor deficiency: Secondary | ICD-10-CM | POA: Diagnosis not present

## 2014-09-13 DIAGNOSIS — E038 Other specified hypothyroidism: Secondary | ICD-10-CM | POA: Diagnosis not present

## 2014-09-13 MED ORDER — METOPROLOL TARTRATE 25 MG PO TABS: ORAL_TABLET | ORAL | Status: DC

## 2014-09-13 MED ORDER — DIAZEPAM 5 MG PO TABS: ORAL_TABLET | ORAL | Status: DC

## 2014-09-13 MED ORDER — VENLAFAXINE HCL ER 75 MG PO CP24: 75.0000 mg | ORAL_CAPSULE | Freq: Every day | ORAL | Status: DC

## 2014-09-13 NOTE — Progress Notes (Signed)
   Subjective:    Patient ID: [name], female    DOB: 10-18-1928, 81 y.o.   MRN: [id number]  HPIMed check up.   Concerns about back pain. She has intermittent back pain. She does have history of osteoporosis. We're recommending to go toward IV therapy.  Having neck pain for the past 3 weeks, this patient has osteoarthritis in multiple joints she doesn't denies any type or radiation down the arms I suspect is more arthritic changes in her neck are recommend acetaminophen .  Headaches since 2004.  Have been waking up with headaches. This patient is taking way too much Tylenol with caffeine. She needs a taper off of this. Her headaches are probably get worse before they get better. Taking extra strength tylenol.  Pt states she had fever a few days ago. Runny nose. Pt states headaches may be coming from sinus. I encouraged patient to use plain decongestants when necessary also saline nasal spray and Flonase.  Pt would like for you to go through all her meds and see which ones she should be taking and if there is any she should stop. Along per time spent going through all the multiple medicines and over-the-counter supplements Review of Systems  Constitutional: Negative for activity change, appetite change and fatigue.  HENT: Negative for congestion.   Respiratory: Negative for cough.   Cardiovascular: Negative for chest pain.  Gastrointestinal: Negative for abdominal pain.  Endocrine: Negative for polydipsia and polyphagia.  Neurological: Negative for weakness.  Psychiatric/Behavioral: Negative for confusion.       Objective:   Physical Exam  Constitutional: She appears well-nourished. No distress.  Cardiovascular: Normal rate, regular rhythm and normal heart sounds.   No murmur heard. Pulmonary/Chest: Effort normal and breath sounds normal. No respiratory distress.  Musculoskeletal: She exhibits no edema.  Lymphadenopathy:    She has no cervical adenopathy.  Neurological: She is  alert. She exhibits normal muscle tone.  Psychiatric: Her behavior is normal.  Vitals reviewed.  no edema in the legs is noted. Significant venous stasis is noted. Diminished pulses in the feet is noted.        Assessment & Plan:  Venous stasis-try to keep feet elevated when possible keep feet warm. Weight loss-poor nutritional intake increase caloric intake including supplements recheck weight in 4-6 weeks Significant amount of time was spent reviewing all of her medicines a whole bag full of medicines was brought including numerous outpatient medicines we thin this down to safer levels of medications and discussed in detail. Decrease Valium 5 mg tablet use a half in the morning half in the afternoon when necessary a full one at night for sleep Decrease metoprolol Decrease Effexor Eliminate commendation medications that is giving the patient way too much decongestant antihistamines and acetaminophen 40 minutes spent with patient today multiple questions spent on medications discussing her weight discussing venous stasis as well as the need to do some lab work looking at her thyroid, greater than half the amount of time was spent discussing the multitudes of questions and medications Because of thyroid issue and elevated MCV check B12 level and TSH T4  Patient to follow-up in 2 months to review over how she is doing in her weight sooner problems

## 2014-09-14 DIAGNOSIS — M9902 Segmental and somatic dysfunction of thoracic region: Secondary | ICD-10-CM | POA: Diagnosis not present

## 2014-09-14 DIAGNOSIS — M5413 Radiculopathy, cervicothoracic region: Secondary | ICD-10-CM | POA: Diagnosis not present

## 2014-09-14 DIAGNOSIS — M9901 Segmental and somatic dysfunction of cervical region: Secondary | ICD-10-CM | POA: Diagnosis not present

## 2014-09-14 DIAGNOSIS — M542 Cervicalgia: Secondary | ICD-10-CM | POA: Diagnosis not present

## 2014-09-14 NOTE — Addendum Note (Signed)
Addended byCharolotte Capuchin D on: 09/14/2014 01:48 PM   Modules accepted: Orders

## 2014-09-16 DIAGNOSIS — M542 Cervicalgia: Secondary | ICD-10-CM | POA: Diagnosis not present

## 2014-09-16 DIAGNOSIS — M9902 Segmental and somatic dysfunction of thoracic region: Secondary | ICD-10-CM | POA: Diagnosis not present

## 2014-09-16 DIAGNOSIS — M5413 Radiculopathy, cervicothoracic region: Secondary | ICD-10-CM | POA: Diagnosis not present

## 2014-09-16 DIAGNOSIS — M9901 Segmental and somatic dysfunction of cervical region: Secondary | ICD-10-CM | POA: Diagnosis not present

## 2014-09-21 ENCOUNTER — Telehealth: Payer: Self-pay | Admitting: *Deleted

## 2014-09-21 ENCOUNTER — Encounter (INDEPENDENT_AMBULATORY_CARE_PROVIDER_SITE_OTHER): Payer: Self-pay | Admitting: *Deleted

## 2014-09-21 ENCOUNTER — Other Ambulatory Visit: Payer: Self-pay | Admitting: *Deleted

## 2014-09-21 DIAGNOSIS — M542 Cervicalgia: Secondary | ICD-10-CM

## 2014-09-21 DIAGNOSIS — M9901 Segmental and somatic dysfunction of cervical region: Secondary | ICD-10-CM | POA: Diagnosis not present

## 2014-09-21 DIAGNOSIS — M5413 Radiculopathy, cervicothoracic region: Secondary | ICD-10-CM | POA: Diagnosis not present

## 2014-09-21 DIAGNOSIS — M9902 Segmental and somatic dysfunction of thoracic region: Secondary | ICD-10-CM | POA: Diagnosis not present

## 2014-09-21 NOTE — Telephone Encounter (Signed)
Order is  In. Discussed with pt's husband that Camora can go over to [facility] to do xray.

## 2014-09-21 NOTE — Telephone Encounter (Signed)
Pt calling wanting an order for neck xray. Pt states she talked with you about this at her last visit. If u approve pt needs a call back to let her know.

## 2014-09-21 NOTE — Telephone Encounter (Signed)
c spine xray REA: neck pain

## 2014-09-22 ENCOUNTER — Ambulatory Visit (HOSPITAL_COMMUNITY)
Admission: RE | Admit: 2014-09-22 | Discharge: 2014-09-22 | Disposition: A | Discharge status: Home / Self Care | Payer: Medicare Other | Source: Ambulatory Visit | Attending: Family Medicine | Admitting: Family Medicine

## 2014-09-22 DIAGNOSIS — M542 Cervicalgia: Secondary | ICD-10-CM | POA: Insufficient documentation

## 2014-09-22 DIAGNOSIS — M5032 Other cervical disc degeneration, mid-cervical region: Secondary | ICD-10-CM | POA: Diagnosis not present

## 2014-09-26 DIAGNOSIS — M9901 Segmental and somatic dysfunction of cervical region: Secondary | ICD-10-CM | POA: Diagnosis not present

## 2014-09-26 DIAGNOSIS — M9902 Segmental and somatic dysfunction of thoracic region: Secondary | ICD-10-CM | POA: Diagnosis not present

## 2014-09-26 DIAGNOSIS — M5413 Radiculopathy, cervicothoracic region: Secondary | ICD-10-CM | POA: Diagnosis not present

## 2014-09-26 DIAGNOSIS — M542 Cervicalgia: Secondary | ICD-10-CM | POA: Diagnosis not present

## 2014-09-28 DIAGNOSIS — M542 Cervicalgia: Secondary | ICD-10-CM | POA: Diagnosis not present

## 2014-09-28 DIAGNOSIS — M5413 Radiculopathy, cervicothoracic region: Secondary | ICD-10-CM | POA: Diagnosis not present

## 2014-09-28 DIAGNOSIS — M9901 Segmental and somatic dysfunction of cervical region: Secondary | ICD-10-CM | POA: Diagnosis not present

## 2014-09-28 DIAGNOSIS — M9902 Segmental and somatic dysfunction of thoracic region: Secondary | ICD-10-CM | POA: Diagnosis not present

## 2014-10-03 ENCOUNTER — Ambulatory Visit (INDEPENDENT_AMBULATORY_CARE_PROVIDER_SITE_OTHER): Payer: Medicare Other | Admitting: Internal Medicine

## 2014-10-03 DIAGNOSIS — M5413 Radiculopathy, cervicothoracic region: Secondary | ICD-10-CM | POA: Diagnosis not present

## 2014-10-03 DIAGNOSIS — M542 Cervicalgia: Secondary | ICD-10-CM | POA: Diagnosis not present

## 2014-10-03 DIAGNOSIS — M9901 Segmental and somatic dysfunction of cervical region: Secondary | ICD-10-CM | POA: Diagnosis not present

## 2014-10-03 DIAGNOSIS — M9902 Segmental and somatic dysfunction of thoracic region: Secondary | ICD-10-CM | POA: Diagnosis not present

## 2014-10-05 DIAGNOSIS — M542 Cervicalgia: Secondary | ICD-10-CM | POA: Diagnosis not present

## 2014-10-05 DIAGNOSIS — M9902 Segmental and somatic dysfunction of thoracic region: Secondary | ICD-10-CM | POA: Diagnosis not present

## 2014-10-05 DIAGNOSIS — M5413 Radiculopathy, cervicothoracic region: Secondary | ICD-10-CM | POA: Diagnosis not present

## 2014-10-05 DIAGNOSIS — M9901 Segmental and somatic dysfunction of cervical region: Secondary | ICD-10-CM | POA: Diagnosis not present

## 2014-10-10 DIAGNOSIS — M5413 Radiculopathy, cervicothoracic region: Secondary | ICD-10-CM | POA: Diagnosis not present

## 2014-10-10 DIAGNOSIS — M9901 Segmental and somatic dysfunction of cervical region: Secondary | ICD-10-CM | POA: Diagnosis not present

## 2014-10-10 DIAGNOSIS — M9902 Segmental and somatic dysfunction of thoracic region: Secondary | ICD-10-CM | POA: Diagnosis not present

## 2014-10-10 DIAGNOSIS — M542 Cervicalgia: Secondary | ICD-10-CM | POA: Diagnosis not present

## 2014-10-12 ENCOUNTER — Encounter (HOSPITAL_COMMUNITY)
Admission: RE | Admit: 2014-10-12 | Discharge: 2014-10-12 | Disposition: A | Discharge status: Home / Self Care | Payer: Medicare Other | Source: Ambulatory Visit | Attending: Family Medicine | Admitting: Family Medicine

## 2014-10-12 DIAGNOSIS — M810 Age-related osteoporosis without current pathological fracture: Secondary | ICD-10-CM | POA: Diagnosis not present

## 2014-10-12 LAB — RENAL FUNCTION PANEL
ALBUMIN: 3.8 g/dL (ref 3.5–5.2)
Anion gap: 8 (ref 5–15)
BUN: 17 mg/dL (ref 6–23)
CHLORIDE: 103 mmol/L (ref 96–112)
CO2: 27 mmol/L (ref 19–32)
Calcium: 9.7 mg/dL (ref 8.4–10.5)
Creatinine, Ser: 1.16 mg/dL — ABNORMAL HIGH (ref 0.50–1.10)
GFR calc non Af Amer: 42 mL/min — ABNORMAL LOW (ref >90)
GFR, EST AFRICAN AMERICAN: 49 mL/min — AB (ref >90)
Glucose, Bld: 166 mg/dL — ABNORMAL HIGH (ref 70–99)
PHOSPHORUS: 4.4 mg/dL (ref 2.3–4.6)
Potassium: 4.7 mmol/L (ref 3.5–5.1)
Sodium: 138 mmol/L (ref 135–145)

## 2014-10-12 MED ORDER — IBANDRONATE SODIUM 3 MG/3ML IV SOLN
3.0000 mg | Freq: Once | INTRAVENOUS | Status: AC
  Administered 2014-10-12: 3 mg via INTRAVENOUS

## 2014-10-12 MED ORDER — IBANDRONATE SODIUM 3 MG/3ML IV SOLN
INTRAVENOUS | Status: AC
  Filled 2014-10-12: qty 3

## 2014-10-12 NOTE — Progress Notes (Signed)
Here for labs and boniva injection. Dx M81.0

## 2014-10-12 NOTE — Progress Notes (Signed)
Results for [name], [name] (MRN [id number]) as of 10/12/2014 16:00  Ref. Range 10/12/2014 14:04  Sodium Latest Range: 135-145 mmol/L 138  Potassium Latest Range: 3.5-5.1 mmol/L 4.7  Chloride Latest Range: 96-112 mmol/L 103  CO2 Latest Range: 19-32 mmol/L 27  BUN Latest Range: 6-23 mg/dL 17  Creatinine Latest Range: 0.50-1.10 mg/dL 1.16 (H)  Calcium Latest Range: 8.4-10.5 mg/dL 9.7  GFR calc non Af Amer Latest Range: >90 mL/min 42 (L)  GFR calc Af Amer Latest Range: >90 mL/min 49 (L)  Glucose Latest Range: 70-99 mg/dL 166 (H)  Anion gap Latest Range: 5-15  8  Phosphorus Latest Range: 2.3-4.6 mg/dL 4.4  Albumin Latest Range: 3.5-5.2 g/dL 3.8

## 2014-10-18 ENCOUNTER — Other Ambulatory Visit (HOSPITAL_COMMUNITY): Payer: Self-pay | Admitting: Pulmonary Disease

## 2014-10-18 ENCOUNTER — Encounter (INDEPENDENT_AMBULATORY_CARE_PROVIDER_SITE_OTHER): Payer: Self-pay | Admitting: Internal Medicine

## 2014-10-18 ENCOUNTER — Ambulatory Visit (INDEPENDENT_AMBULATORY_CARE_PROVIDER_SITE_OTHER): Payer: Medicare Other | Admitting: Internal Medicine

## 2014-10-18 VITALS — BP 108/66 | HR 64 | Temp 96.8°F | Resp 16 | Ht 59.0 in | Wt 81.3 lb

## 2014-10-18 DIAGNOSIS — K3184 Gastroparesis: Secondary | ICD-10-CM

## 2014-10-18 DIAGNOSIS — J850 Gangrene and necrosis of lung: Secondary | ICD-10-CM

## 2014-10-18 DIAGNOSIS — J189 Pneumonia, unspecified organism: Secondary | ICD-10-CM

## 2014-10-18 DIAGNOSIS — R634 Abnormal weight loss: Secondary | ICD-10-CM | POA: Diagnosis not present

## 2014-10-18 NOTE — Patient Instructions (Addendum)
Decreased domperidone to 5 mg twice daily; if you have more nausea and/or vomiting can go back to 10 mg twice daily or 5 mg before each meal Remember to exercise for few minutes daily while he was sitting or lying flat.

## 2014-10-18 NOTE — Progress Notes (Signed)
Presenting complaint;  Follow-up for gastroparesis and colostomy issues  Subjective:  Patient is a 81-year-old Caucasian female who is here for scheduled visit accompanied by her niece Ms. Nile Dear. She was last seen 4 months ago. She was evaluated by ostomy nurse at [facility]. She is not using different antibiotic and she is quite pleased. She states subcutaneous nodule lateral to ostomy site has increased in size. She is not having any discomfort. She remains with intermittent nausea and sporadic vomiting. She had emesis last week when she vomited food and fluid. She does not have good appetite. She eats 3 meals and one snack. Her niece states her meals are very small she denies bleeding into ileostomy bag. Only time stool is black when she has taken Pepto-Bismol which is not often. She takes pain medication only when she has headache. She has lost 7 pounds in one year. She states she lost down to 78 pounds her neck she has gained 3 pounds. Her niece states she was briefly hospitalized for degree in thought to be related to medications.   Current Medications: Outpatient Encounter Prescriptions as of 10/18/2014  Medication Sig  . acetaminophen (TYLENOL) 500 MG tablet Take 500 mg by mouth daily as needed for headache.   . ATROVENT HFA 17 MCG/ACT inhaler 2 PUFFS FOUR TIMES DAILY. (Patient taking differently: TAKE TWO PUFFS VIA INHALATION TWICE DAILY)  . beta carotene w/minerals (OCUVITE) tablet Take 1 tablet by mouth daily.  Marland Kitchen bismuth subsalicylate (PEPTO BISMOL) 262 MG/15ML suspension Take 30 mLs by mouth every 6 (six) hours as needed for indigestion or diarrhea or loose stools.  . calcium carbonate (OS-CAL) 600 MG TABS tablet Take by mouth daily.  . citalopram (CELEXA) 10 MG tablet TAKE (1) TABLET BY MOUTH ONCE DAILY.  . diazepam (VALIUM) 5 MG tablet Take one half tablet every 6 hours during the day as needed and one qhs.  . fluticasone (FLONASE) 50 MCG/ACT nasal spray Place into both nostrils  daily. Patient states that she uses as needed  . levothyroxine (SYNTHROID, LEVOTHROID) 25 MCG tablet TAKE 2 TABLETS ON MONDAY AND FRIDAY AND ONE TABLET ALL OTHER DAYS.  Marland Kitchen memantine (NAMENDA) 10 MG tablet TAKE 1 TABLET TWICE A DAY FOR DEMENTIA.  Marland Kitchen metoprolol tartrate (LOPRESSOR) 25 MG tablet Take one half tablet BID  . Multiple Vitamin (MULTIVITAMIN) tablet Take 1 tablet by mouth daily.  . NONFORMULARY OR COMPOUNDED ITEM Take 10 mg by mouth 2 (two) times daily.  . pantoprazole (PROTONIX) 40 MG tablet TAKE ONE TABLET DAILY FOR ACID REFLUX. (Patient taking differently: TAKE ONE TO TWO TABLETS DAILY AS NEEDED FOR ACID REFLUX.)  . venlafaxine XR (EFFEXOR-XR) 75 MG 24 hr capsule Take 1 capsule (75 mg total) by mouth daily with breakfast.  . vitamin E 400 UNIT capsule Take 400 Units by mouth daily.  Marland Kitchen HYDROcodone-acetaminophen (NORCO/VICODIN) 5-325 MG per tablet TAKE (1) TABLET EVERY FOUR HOURS AS NEEDED. (Patient not taking: Reported on 10/18/2014)  . loratadine (CLARITIN) 10 MG tablet Take 10 mg by mouth daily as needed for allergies.  Marland Kitchen nystatin (MYCOSTATIN) 100000 UNIT/ML suspension   . [DISCONTINUED] OVER THE COUNTER MEDICATION Vit d3 400 iu one a day  . [DISCONTINUED] potassium chloride (MICRO-K) 10 MEQ CR capsule Take 10 mEq by mouth daily.     Objective: Blood pressure 108/66, pulse 64, temperature 96.8 F (36 C), temperature source Oral, resp. rate 16, height 4\' 11"  (1.499 m), weight 81 lb 4.8 oz (36.877 kg). Patient is alert and in no  acute distress. She has generalized wasting. Conjunctiva is pink. Sclera is nonicteric Oropharyngeal mucosa is normal. No neck masses or thyromegaly noted. Cardiac exam with regular rhythm normal S1 and S2. No murmur or gallop noted. Lungs are clear to auscultation. Abdomen. Ileostomy is located in right lower quadrant. Back has pasty brownish stool. Skin was palpated through the colostomy bag but no nodule felt. No tenderness noted. She has mild  midepigastric tenderness but no organomegaly or masses.  No LE edema or clubbing noted.  Labs/studies Results: CBC from 08/30/2014 reviewed; H&H 13.8 and 41.7   Mistry panel also reviewed from same date. Glucose 165 serum calcium 9.6 and albumin 3.9.  Assessment:  #1. Idiopathic gastroparesis. She appears to be doing well with domperidone. Would like to decrease dose and see how she does #2. Peristomal subcutaneous nodule. #3. Gradual weight loss. This appears to be due to many shoulder and intake.   Plan:  Patient advised to eat 6 meals daily. Decrease domperidone to 5 mg by mouth twice a day. If nausea and vomiting recurs can go back to 5 mg 3 times a day or 10 mg twice a day. Patient encouraged to do extremity exercises while at rest. She will call if that is stomal subcutaneous nodule enlarges Office visit in 4 months.

## 2014-10-19 DIAGNOSIS — M9902 Segmental and somatic dysfunction of thoracic region: Secondary | ICD-10-CM | POA: Diagnosis not present

## 2014-10-19 DIAGNOSIS — M9901 Segmental and somatic dysfunction of cervical region: Secondary | ICD-10-CM | POA: Diagnosis not present

## 2014-10-19 DIAGNOSIS — M5413 Radiculopathy, cervicothoracic region: Secondary | ICD-10-CM | POA: Diagnosis not present

## 2014-10-19 DIAGNOSIS — M542 Cervicalgia: Secondary | ICD-10-CM | POA: Diagnosis not present

## 2014-10-25 ENCOUNTER — Other Ambulatory Visit: Payer: Self-pay | Admitting: *Deleted

## 2014-10-26 DIAGNOSIS — M542 Cervicalgia: Secondary | ICD-10-CM | POA: Diagnosis not present

## 2014-10-26 DIAGNOSIS — M5413 Radiculopathy, cervicothoracic region: Secondary | ICD-10-CM | POA: Diagnosis not present

## 2014-10-26 DIAGNOSIS — M9901 Segmental and somatic dysfunction of cervical region: Secondary | ICD-10-CM | POA: Diagnosis not present

## 2014-10-26 DIAGNOSIS — M9902 Segmental and somatic dysfunction of thoracic region: Secondary | ICD-10-CM | POA: Diagnosis not present

## 2014-10-26 NOTE — Telephone Encounter (Signed)
May have this +4 refills 

## 2014-10-27 MED ORDER — DIAZEPAM 5 MG PO TABS: ORAL_TABLET | ORAL | Status: DC

## 2014-10-28 ENCOUNTER — Other Ambulatory Visit: Payer: Self-pay | Admitting: Internal Medicine

## 2014-10-28 DIAGNOSIS — R52 Pain, unspecified: Secondary | ICD-10-CM

## 2014-11-01 ENCOUNTER — Ambulatory Visit (HOSPITAL_COMMUNITY)
Admission: RE | Admit: 2014-11-01 | Discharge: 2014-11-01 | Disposition: A | Discharge status: Home / Self Care | Payer: Medicare Other | Source: Ambulatory Visit | Attending: Pulmonary Disease | Admitting: Pulmonary Disease

## 2014-11-01 DIAGNOSIS — J850 Gangrene and necrosis of lung: Secondary | ICD-10-CM | POA: Diagnosis not present

## 2014-11-01 DIAGNOSIS — J984 Other disorders of lung: Secondary | ICD-10-CM | POA: Diagnosis not present

## 2014-11-01 DIAGNOSIS — J189 Pneumonia, unspecified organism: Secondary | ICD-10-CM

## 2014-11-01 DIAGNOSIS — R911 Solitary pulmonary nodule: Secondary | ICD-10-CM | POA: Diagnosis not present

## 2014-11-03 ENCOUNTER — Other Ambulatory Visit (HOSPITAL_COMMUNITY): Payer: Self-pay | Admitting: Pulmonary Disease

## 2014-11-03 DIAGNOSIS — R918 Other nonspecific abnormal finding of lung field: Secondary | ICD-10-CM

## 2014-11-08 DIAGNOSIS — E46 Unspecified protein-calorie malnutrition: Secondary | ICD-10-CM | POA: Diagnosis not present

## 2014-11-08 DIAGNOSIS — J470 Bronchiectasis with acute lower respiratory infection: Secondary | ICD-10-CM | POA: Diagnosis not present

## 2014-11-08 DIAGNOSIS — J449 Chronic obstructive pulmonary disease, unspecified: Secondary | ICD-10-CM | POA: Diagnosis not present

## 2014-11-11 ENCOUNTER — Ambulatory Visit (HOSPITAL_COMMUNITY)
Admission: RE | Admit: 2014-11-11 | Discharge: 2014-11-11 | Disposition: A | Discharge status: Home / Self Care | Payer: Medicare Other | Source: Ambulatory Visit | Attending: Pulmonary Disease | Admitting: Pulmonary Disease

## 2014-11-11 DIAGNOSIS — R918 Other nonspecific abnormal finding of lung field: Secondary | ICD-10-CM

## 2014-11-11 DIAGNOSIS — C3490 Malignant neoplasm of unspecified part of unspecified bronchus or lung: Secondary | ICD-10-CM | POA: Insufficient documentation

## 2014-11-11 LAB — GLUCOSE, CAPILLARY: GLUCOSE-CAPILLARY: 97 mg/dL (ref 70–99)

## 2014-11-11 MED ORDER — FLUDEOXYGLUCOSE F - 18 (FDG) INJECTION
4.9000 | Freq: Once | INTRAVENOUS | Status: AC | PRN
Start: 2014-11-11 — End: 2014-11-11

## 2014-11-14 ENCOUNTER — Encounter: Payer: Self-pay | Admitting: Family Medicine

## 2014-11-14 ENCOUNTER — Ambulatory Visit (INDEPENDENT_AMBULATORY_CARE_PROVIDER_SITE_OTHER): Payer: Medicare Other | Admitting: Family Medicine

## 2014-11-14 VITALS — BP 108/62 | Ht 59.0 in | Wt 81.2 lb

## 2014-11-14 DIAGNOSIS — D510 Vitamin B12 deficiency anemia due to intrinsic factor deficiency: Secondary | ICD-10-CM | POA: Diagnosis not present

## 2014-11-14 DIAGNOSIS — R918 Other nonspecific abnormal finding of lung field: Secondary | ICD-10-CM

## 2014-11-14 DIAGNOSIS — R634 Abnormal weight loss: Secondary | ICD-10-CM

## 2014-11-14 DIAGNOSIS — E038 Other specified hypothyroidism: Secondary | ICD-10-CM

## 2014-11-14 DIAGNOSIS — G44021 Chronic cluster headache, intractable: Secondary | ICD-10-CM | POA: Diagnosis not present

## 2014-11-14 NOTE — Patient Instructions (Addendum)
Stop Celexa  Eat rich!!  Please do your labwork at [facility]

## 2014-11-14 NOTE — Progress Notes (Signed)
   Subjective:    Patient ID: [name], female    DOB: 1928-09-10, 81 y.o.   MRN: [id number]  HPI Patient arrives for a follow up on weight loss. Patient eating better and has gained a few pounds. She states her reflux been under good control. Taking her medicine as directed She states her moods are doing good denies being depressed She still has frequent headaches she wish there was a medicine that would help this. We have tried various measures in the past this is been a chronic issue for years and it does not wake her up for cause vomiting unfortunately there is probably no good answer.  Review of Systems Her appetite improved somewhat with the medicine she was recently started on she denies any chest tightness pressure pain shortness of breath except when she pushes herself she does get a little short of breath    Objective:   Physical Exam  Her lungs are clear hearts regular pulse normal she is thin but her weight is actually up 2 pounds compared where it was extremities no edema  I did discuss with her her recent PET scan it is unlikely that this is cancer did tell her she needs to talk with Dr. Luan Pulling who ordered it to see if he would like to do a repeat CAT scan in 6 months    Assessment & Plan:  Chronic headache-Tylenol when necessary avoid overusage patient already on antidepressants to try to help. I don't recommend chronic narcotic use for this  Hypothyroidism she needs check of thyroid function she will go today to get that completed  Depression issues pulmonologist just recently started her her on Remeron half a tablet in the evening. We will start Celexa.  COPD stable on current medication  Osteoporosis continue Fosamax.  Chronic back pain use hydrocodone only when necessary  Follow-up in 3 months  Greater than 25 minutes spent with patient discussing these multiple issues and discussing the results of her PET scan

## 2014-11-15 ENCOUNTER — Encounter: Payer: Self-pay | Admitting: Family Medicine

## 2014-11-15 LAB — VITAMIN B12: Vitamin B-12: 748 pg/mL (ref 211–946)

## 2014-11-15 LAB — T4, FREE: Free T4: 0.82 ng/dL (ref 0.82–1.77)

## 2014-11-15 LAB — TSH: TSH: 0.62 u[IU]/mL (ref 0.450–4.500)

## 2014-11-17 DIAGNOSIS — S61300A Unspecified open wound of right index finger with damage to nail, initial encounter: Secondary | ICD-10-CM | POA: Diagnosis not present

## 2014-11-17 DIAGNOSIS — S60221A Contusion of right hand, initial encounter: Secondary | ICD-10-CM | POA: Diagnosis not present

## 2014-11-24 DIAGNOSIS — S61200D Unspecified open wound of right index finger without damage to nail, subsequent encounter: Secondary | ICD-10-CM | POA: Diagnosis not present

## 2014-11-30 ENCOUNTER — Other Ambulatory Visit: Payer: Self-pay | Admitting: Family Medicine

## 2014-11-30 NOTE — Telephone Encounter (Signed)
May refill this +2 additional refills

## 2014-12-02 ENCOUNTER — Other Ambulatory Visit: Payer: Self-pay | Admitting: Family Medicine

## 2014-12-05 ENCOUNTER — Telehealth: Payer: Self-pay | Admitting: Family Medicine

## 2014-12-05 MED ORDER — FLUCONAZOLE 150 MG PO TABS: 150.0000 mg | ORAL_TABLET | Freq: Every day | ORAL | Status: DC

## 2014-12-05 NOTE — Telephone Encounter (Signed)
Notified husband that Diflucan was sent in to the pharmacy per protocol.

## 2014-12-05 NOTE — Telephone Encounter (Signed)
Pt is requesting something be called in for a yeast infection.  [facility]

## 2014-12-07 ENCOUNTER — Other Ambulatory Visit: Payer: Self-pay | Admitting: Family Medicine

## 2014-12-08 ENCOUNTER — Encounter (INDEPENDENT_AMBULATORY_CARE_PROVIDER_SITE_OTHER): Payer: Self-pay | Admitting: *Deleted

## 2014-12-09 ENCOUNTER — Encounter: Payer: Self-pay | Admitting: Family Medicine

## 2014-12-09 ENCOUNTER — Ambulatory Visit (INDEPENDENT_AMBULATORY_CARE_PROVIDER_SITE_OTHER): Payer: Medicare Other | Admitting: Family Medicine

## 2014-12-09 VITALS — BP 118/80 | Temp 97.8°F | Ht 59.0 in | Wt 80.2 lb

## 2014-12-09 DIAGNOSIS — L739 Follicular disorder, unspecified: Secondary | ICD-10-CM

## 2014-12-09 DIAGNOSIS — R51 Headache: Secondary | ICD-10-CM

## 2014-12-09 DIAGNOSIS — N760 Acute vaginitis: Secondary | ICD-10-CM | POA: Diagnosis not present

## 2014-12-09 DIAGNOSIS — G8929 Other chronic pain: Secondary | ICD-10-CM

## 2014-12-09 MED ORDER — TOPIRAMATE 25 MG PO TABS: 25.0000 mg | ORAL_TABLET | Freq: Two times a day (BID) | ORAL | Status: DC

## 2014-12-09 MED ORDER — DOXYCYCLINE HYCLATE 100 MG PO CAPS: 100.0000 mg | ORAL_CAPSULE | Freq: Two times a day (BID) | ORAL | Status: DC

## 2014-12-09 NOTE — Addendum Note (Signed)
Addended by: Launa Grill on: 12/09/2014 04:24 PM   Modules accepted: Orders

## 2014-12-09 NOTE — Progress Notes (Signed)
   Subjective:    Patient ID: [name], female    DOB: 1928/09/19, 81 y.o.   MRN: [id number]  Vaginal Discharge The patient's primary symptoms include vaginal discharge. This is a new problem. The current episode started 1 to 4 weeks ago. The problem occurs constantly. The pain is moderate. The problem affects the left side. She is not pregnant. The symptoms are aggravated by activity (Walking).   Patient states she would also like to discuss headaches. Patient has no other concerns this visit. She is had these for years.  Review of Systems  Genitourinary: Positive for vaginal discharge.   headaches are pretty much throughout the day more than the frontal part to some degree in the sinus part patient relates coughing up yellow phlegm patient denies vomiting with the headaches they do not wake her up at night.     Objective:   Physical Exam  Lungs are clear hearts regular extremities no edema vaginal exam I do not see any growths present. Internal exam not done. Patient does have what appears to be folliculitis along the vagina vulvar region 2 small nodules no abscess  Wet prep shows occasional WBC otherwise negative    Assessment & Plan:  The patient is under the care of pulmonology they're following her lung issues  Folliculitis dockside can twice a day for 7-10 days  Patient with headaches will try Topamax 25 mg once daily to see if this helps subside headaches. This patient has had chronic headaches for years

## 2014-12-09 NOTE — Patient Instructions (Addendum)
Doxycycline one 2 times a day for the vagina skin infection use for at least 7 days Take with food and water  Topamax one daily for the headaches

## 2015-01-12 ENCOUNTER — Encounter (HOSPITAL_COMMUNITY)
Admission: RE | Admit: 2015-01-12 | Discharge: 2015-01-12 | Disposition: A | Discharge status: Home / Self Care | Payer: Medicare Other | Source: Ambulatory Visit | Attending: Family Medicine | Admitting: Family Medicine

## 2015-01-12 ENCOUNTER — Encounter (HOSPITAL_COMMUNITY): Payer: Self-pay

## 2015-01-12 DIAGNOSIS — M810 Age-related osteoporosis without current pathological fracture: Secondary | ICD-10-CM | POA: Insufficient documentation

## 2015-01-12 LAB — RENAL FUNCTION PANEL
ALBUMIN: 3.3 g/dL — AB (ref 3.5–5.0)
Anion gap: 6 (ref 5–15)
BUN: 13 mg/dL (ref 6–20)
CO2: 27 mmol/L (ref 22–32)
Calcium: 9.2 mg/dL (ref 8.9–10.3)
Chloride: 101 mmol/L (ref 101–111)
Creatinine, Ser: 0.96 mg/dL (ref 0.44–1.00)
GFR calc Af Amer: >60 mL/min (ref >60)
GFR, EST NON AFRICAN AMERICAN: 53 mL/min — AB (ref >60)
GLUCOSE: 121 mg/dL — AB (ref 65–99)
Phosphorus: 2.9 mg/dL (ref 2.5–4.6)
Potassium: 4.5 mmol/L (ref 3.5–5.1)
SODIUM: 134 mmol/L — AB (ref 135–145)

## 2015-01-12 MED ORDER — IBANDRONATE SODIUM 3 MG/3ML IV SOLN
INTRAVENOUS | Status: AC
  Filled 2015-01-12: qty 3

## 2015-01-12 MED ORDER — IBANDRONATE SODIUM 3 MG/3ML IV SOLN
3.0000 mg | Freq: Once | INTRAVENOUS | Status: AC
  Administered 2015-01-12: 3 mg via INTRAVENOUS

## 2015-01-12 NOTE — Progress Notes (Signed)
Results for ZYONNA, VARDAMAN (MRN [id number]) as of 01/12/2015 15:33  Labs done prior to [facility] $RemoveBefore'3mg'[id number]$   Tolerated well. Appointment made for 3 month visit 04/14/2015   Ref. Range 01/12/2015 14:20  Sodium Latest Ref Range: 135-145 mmol/L 134 (L)  Potassium Latest Ref Range: 3.5-5.1 mmol/L 4.5  Chloride Latest Ref Range: 101-111 mmol/L 101  CO2 Latest Ref Range: 22-32 mmol/L 27  BUN Latest Ref Range: 6-20 mg/dL 13  Creatinine Latest Ref Range: 0.44-1.00 mg/dL 0.96  Calcium Latest Ref Range: 8.9-10.3 mg/dL 9.2  EGFR (Non-African Amer.) Latest Ref Range: >60 mL/min 53 (L)  EGFR (African American) Latest Ref Range: >60 mL/min >60  Glucose Latest Ref Range: 65-99 mg/dL 121 (H)  Anion gap Latest Ref Range: 5-15  6  Phosphorus Latest Ref Range: 2.5-4.6 mg/dL 2.9  Albumin Latest Ref Range: 3.5-5.0 g/dL 3.3 (L)

## 2015-01-14 ENCOUNTER — Emergency Department (HOSPITAL_COMMUNITY): Payer: Medicare Other

## 2015-01-14 ENCOUNTER — Inpatient Hospital Stay (HOSPITAL_COMMUNITY)
Admission: EM | Admit: 2015-01-14 | Discharge: 2015-01-20 | DRG: 178 | Disposition: A | Discharge status: Home / Self Care | Payer: Medicare Other | Attending: Internal Medicine | Admitting: Internal Medicine

## 2015-01-14 ENCOUNTER — Encounter (HOSPITAL_COMMUNITY): Payer: Self-pay | Admitting: Emergency Medicine

## 2015-01-14 DIAGNOSIS — M549 Dorsalgia, unspecified: Secondary | ICD-10-CM | POA: Diagnosis present

## 2015-01-14 DIAGNOSIS — N289 Disorder of kidney and ureter, unspecified: Secondary | ICD-10-CM | POA: Diagnosis present

## 2015-01-14 DIAGNOSIS — Z932 Ileostomy status: Secondary | ICD-10-CM

## 2015-01-14 DIAGNOSIS — G8929 Other chronic pain: Secondary | ICD-10-CM | POA: Diagnosis present

## 2015-01-14 DIAGNOSIS — G43909 Migraine, unspecified, not intractable, without status migrainosus: Secondary | ICD-10-CM | POA: Diagnosis present

## 2015-01-14 DIAGNOSIS — R042 Hemoptysis: Secondary | ICD-10-CM | POA: Diagnosis not present

## 2015-01-14 DIAGNOSIS — I058 Other rheumatic mitral valve diseases: Secondary | ICD-10-CM | POA: Diagnosis not present

## 2015-01-14 DIAGNOSIS — J449 Chronic obstructive pulmonary disease, unspecified: Secondary | ICD-10-CM | POA: Diagnosis present

## 2015-01-14 DIAGNOSIS — M542 Cervicalgia: Secondary | ICD-10-CM | POA: Diagnosis present

## 2015-01-14 DIAGNOSIS — K219 Gastro-esophageal reflux disease without esophagitis: Secondary | ICD-10-CM | POA: Diagnosis present

## 2015-01-14 DIAGNOSIS — R634 Abnormal weight loss: Secondary | ICD-10-CM | POA: Diagnosis present

## 2015-01-14 DIAGNOSIS — F411 Generalized anxiety disorder: Secondary | ICD-10-CM | POA: Diagnosis present

## 2015-01-14 DIAGNOSIS — Z79899 Other long term (current) drug therapy: Secondary | ICD-10-CM

## 2015-01-14 DIAGNOSIS — A159 Respiratory tuberculosis unspecified: Secondary | ICD-10-CM | POA: Diagnosis not present

## 2015-01-14 DIAGNOSIS — D649 Anemia, unspecified: Secondary | ICD-10-CM | POA: Diagnosis present

## 2015-01-14 DIAGNOSIS — E039 Hypothyroidism, unspecified: Secondary | ICD-10-CM | POA: Diagnosis present

## 2015-01-14 DIAGNOSIS — R9389 Abnormal findings on diagnostic imaging of other specified body structures: Secondary | ICD-10-CM | POA: Diagnosis present

## 2015-01-14 DIAGNOSIS — J984 Other disorders of lung: Secondary | ICD-10-CM | POA: Diagnosis not present

## 2015-01-14 DIAGNOSIS — M810 Age-related osteoporosis without current pathological fracture: Secondary | ICD-10-CM | POA: Diagnosis present

## 2015-01-14 DIAGNOSIS — K3184 Gastroparesis: Secondary | ICD-10-CM | POA: Diagnosis not present

## 2015-01-14 DIAGNOSIS — R918 Other nonspecific abnormal finding of lung field: Secondary | ICD-10-CM | POA: Diagnosis present

## 2015-01-14 DIAGNOSIS — J159 Unspecified bacterial pneumonia: Secondary | ICD-10-CM | POA: Diagnosis present

## 2015-01-14 DIAGNOSIS — R1031 Right lower quadrant pain: Secondary | ICD-10-CM | POA: Diagnosis not present

## 2015-01-14 DIAGNOSIS — Z8611 Personal history of tuberculosis: Secondary | ICD-10-CM

## 2015-01-14 DIAGNOSIS — Z888 Allergy status to other drugs, medicaments and biological substances status: Secondary | ICD-10-CM

## 2015-01-14 DIAGNOSIS — I700 Atherosclerosis of aorta: Secondary | ICD-10-CM | POA: Diagnosis not present

## 2015-01-14 DIAGNOSIS — Z887 Allergy status to serum and vaccine status: Secondary | ICD-10-CM

## 2015-01-14 DIAGNOSIS — Z90710 Acquired absence of both cervix and uterus: Secondary | ICD-10-CM

## 2015-01-14 DIAGNOSIS — Z9049 Acquired absence of other specified parts of digestive tract: Secondary | ICD-10-CM | POA: Diagnosis present

## 2015-01-14 DIAGNOSIS — Z903 Acquired absence of stomach [part of]: Secondary | ICD-10-CM | POA: Diagnosis present

## 2015-01-14 DIAGNOSIS — K589 Irritable bowel syndrome without diarrhea: Secondary | ICD-10-CM | POA: Diagnosis present

## 2015-01-14 DIAGNOSIS — Z681 Body mass index (BMI) 19 or less, adult: Secondary | ICD-10-CM | POA: Diagnosis not present

## 2015-01-14 HISTORY — DX: Cervicalgia: M54.2

## 2015-01-14 HISTORY — DX: Gastroparesis: K31.84

## 2015-01-14 HISTORY — DX: Other chronic pain: G89.29

## 2015-01-14 HISTORY — DX: Dorsalgia, unspecified: M54.9

## 2015-01-14 HISTORY — DX: Unspecified abdominal pain: R10.9

## 2015-01-14 LAB — CBC WITH DIFFERENTIAL/PLATELET
BASOS PCT: 0 % (ref 0–1)
Basophils Absolute: 0.1 10*3/uL (ref 0.0–0.1)
EOS ABS: 0.4 10*3/uL (ref 0.0–0.7)
Eosinophils Relative: 3 % (ref 0–5)
HEMATOCRIT: 38.7 % (ref 36.0–46.0)
HEMOGLOBIN: 12.3 g/dL (ref 12.0–15.0)
Lymphocytes Relative: 31 % (ref 12–46)
Lymphs Abs: 4.2 10*3/uL — ABNORMAL HIGH (ref 0.7–4.0)
MCH: 31.5 pg (ref 26.0–34.0)
MCHC: 31.8 g/dL (ref 30.0–36.0)
MCV: 99 fL (ref 78.0–100.0)
Monocytes Absolute: 1.2 10*3/uL — ABNORMAL HIGH (ref 0.1–1.0)
Monocytes Relative: 9 % (ref 3–12)
NEUTROS ABS: 7.9 10*3/uL — AB (ref 1.7–7.7)
Neutrophils Relative %: 57 % (ref 43–77)
PLATELETS: 348 10*3/uL (ref 150–400)
RBC: 3.91 MIL/uL (ref 3.87–5.11)
RDW: 14.1 % (ref 11.5–15.5)
WBC: 13.7 10*3/uL — ABNORMAL HIGH (ref 4.0–10.5)

## 2015-01-14 LAB — BASIC METABOLIC PANEL
ANION GAP: 8 (ref 5–15)
BUN: 12 mg/dL (ref 6–20)
CALCIUM: 8.6 mg/dL — AB (ref 8.9–10.3)
CO2: 28 mmol/L (ref 22–32)
CREATININE: 1.13 mg/dL — AB (ref 0.44–1.00)
Chloride: 101 mmol/L (ref 101–111)
GFR calc Af Amer: 51 mL/min — ABNORMAL LOW (ref >60)
GFR calc non Af Amer: 44 mL/min — ABNORMAL LOW (ref >60)
GLUCOSE: 105 mg/dL — AB (ref 65–99)
Potassium: 4.3 mmol/L (ref 3.5–5.1)
Sodium: 137 mmol/L (ref 135–145)

## 2015-01-14 LAB — PROTIME-INR
INR: 1.01 (ref 0.00–1.49)
PROTHROMBIN TIME: 13.5 s (ref 11.6–15.2)

## 2015-01-14 NOTE — ED Notes (Signed)
Pt. Reports coughing up blood starting today. Pt. C/o abdominal pain. Pt. Denies nausea/vomiting/diarrhea.

## 2015-01-15 ENCOUNTER — Emergency Department (HOSPITAL_COMMUNITY): Payer: Medicare Other

## 2015-01-15 DIAGNOSIS — Z932 Ileostomy status: Secondary | ICD-10-CM | POA: Diagnosis not present

## 2015-01-15 DIAGNOSIS — R042 Hemoptysis: Secondary | ICD-10-CM | POA: Diagnosis present

## 2015-01-15 DIAGNOSIS — I700 Atherosclerosis of aorta: Secondary | ICD-10-CM | POA: Diagnosis not present

## 2015-01-15 DIAGNOSIS — J449 Chronic obstructive pulmonary disease, unspecified: Secondary | ICD-10-CM | POA: Diagnosis present

## 2015-01-15 DIAGNOSIS — F411 Generalized anxiety disorder: Secondary | ICD-10-CM | POA: Diagnosis present

## 2015-01-15 DIAGNOSIS — R634 Abnormal weight loss: Secondary | ICD-10-CM | POA: Diagnosis present

## 2015-01-15 DIAGNOSIS — A159 Respiratory tuberculosis unspecified: Secondary | ICD-10-CM | POA: Diagnosis present

## 2015-01-15 DIAGNOSIS — K3184 Gastroparesis: Secondary | ICD-10-CM | POA: Diagnosis present

## 2015-01-15 DIAGNOSIS — G43909 Migraine, unspecified, not intractable, without status migrainosus: Secondary | ICD-10-CM | POA: Diagnosis present

## 2015-01-15 DIAGNOSIS — J159 Unspecified bacterial pneumonia: Secondary | ICD-10-CM | POA: Diagnosis present

## 2015-01-15 DIAGNOSIS — R918 Other nonspecific abnormal finding of lung field: Secondary | ICD-10-CM | POA: Diagnosis present

## 2015-01-15 DIAGNOSIS — G8929 Other chronic pain: Secondary | ICD-10-CM | POA: Diagnosis present

## 2015-01-15 DIAGNOSIS — J984 Other disorders of lung: Secondary | ICD-10-CM | POA: Diagnosis not present

## 2015-01-15 DIAGNOSIS — I058 Other rheumatic mitral valve diseases: Secondary | ICD-10-CM | POA: Diagnosis not present

## 2015-01-15 DIAGNOSIS — A150 Tuberculosis of lung: Secondary | ICD-10-CM | POA: Diagnosis not present

## 2015-01-15 DIAGNOSIS — Z887 Allergy status to serum and vaccine status: Secondary | ICD-10-CM | POA: Diagnosis not present

## 2015-01-15 DIAGNOSIS — Z8611 Personal history of tuberculosis: Secondary | ICD-10-CM | POA: Diagnosis not present

## 2015-01-15 DIAGNOSIS — Z90710 Acquired absence of both cervix and uterus: Secondary | ICD-10-CM | POA: Diagnosis not present

## 2015-01-15 DIAGNOSIS — Z79899 Other long term (current) drug therapy: Secondary | ICD-10-CM | POA: Diagnosis not present

## 2015-01-15 DIAGNOSIS — D649 Anemia, unspecified: Secondary | ICD-10-CM | POA: Diagnosis present

## 2015-01-15 DIAGNOSIS — N289 Disorder of kidney and ureter, unspecified: Secondary | ICD-10-CM | POA: Diagnosis present

## 2015-01-15 DIAGNOSIS — E038 Other specified hypothyroidism: Secondary | ICD-10-CM | POA: Diagnosis not present

## 2015-01-15 DIAGNOSIS — Z903 Acquired absence of stomach [part of]: Secondary | ICD-10-CM | POA: Diagnosis present

## 2015-01-15 DIAGNOSIS — Z9049 Acquired absence of other specified parts of digestive tract: Secondary | ICD-10-CM | POA: Diagnosis present

## 2015-01-15 DIAGNOSIS — E039 Hypothyroidism, unspecified: Secondary | ICD-10-CM | POA: Diagnosis not present

## 2015-01-15 DIAGNOSIS — Z681 Body mass index (BMI) 19 or less, adult: Secondary | ICD-10-CM | POA: Diagnosis not present

## 2015-01-15 DIAGNOSIS — Z888 Allergy status to other drugs, medicaments and biological substances status: Secondary | ICD-10-CM | POA: Diagnosis not present

## 2015-01-15 DIAGNOSIS — K219 Gastro-esophageal reflux disease without esophagitis: Secondary | ICD-10-CM | POA: Diagnosis present

## 2015-01-15 DIAGNOSIS — M542 Cervicalgia: Secondary | ICD-10-CM | POA: Diagnosis present

## 2015-01-15 DIAGNOSIS — R938 Abnormal findings on diagnostic imaging of other specified body structures: Secondary | ICD-10-CM | POA: Diagnosis not present

## 2015-01-15 DIAGNOSIS — K589 Irritable bowel syndrome without diarrhea: Secondary | ICD-10-CM | POA: Diagnosis present

## 2015-01-15 DIAGNOSIS — M810 Age-related osteoporosis without current pathological fracture: Secondary | ICD-10-CM | POA: Diagnosis present

## 2015-01-15 DIAGNOSIS — M549 Dorsalgia, unspecified: Secondary | ICD-10-CM | POA: Diagnosis present

## 2015-01-15 LAB — CBC
HCT: 38.5 % (ref 36.0–46.0)
Hemoglobin: 12.3 g/dL (ref 12.0–15.0)
MCH: 31.3 pg (ref 26.0–34.0)
MCHC: 31.9 g/dL (ref 30.0–36.0)
MCV: 98 fL (ref 78.0–100.0)
Platelets: 371 10*3/uL (ref 150–400)
RBC: 3.93 MIL/uL (ref 3.87–5.11)
RDW: 13.9 % (ref 11.5–15.5)
WBC: 11.9 10*3/uL — AB (ref 4.0–10.5)

## 2015-01-15 LAB — COMPREHENSIVE METABOLIC PANEL
ALT: 14 U/L (ref 14–54)
AST: 19 U/L (ref 15–41)
Albumin: 2.8 g/dL — ABNORMAL LOW (ref 3.5–5.0)
Alkaline Phosphatase: 125 U/L (ref 38–126)
Anion gap: 8 (ref 5–15)
BUN: 10 mg/dL (ref 6–20)
CALCIUM: 7.9 mg/dL — AB (ref 8.9–10.3)
CHLORIDE: 101 mmol/L (ref 101–111)
CO2: 28 mmol/L (ref 22–32)
Creatinine, Ser: 0.88 mg/dL (ref 0.44–1.00)
GFR calc Af Amer: >60 mL/min (ref >60)
GFR calc non Af Amer: 59 mL/min — ABNORMAL LOW (ref >60)
Glucose, Bld: 152 mg/dL — ABNORMAL HIGH (ref 65–99)
POTASSIUM: 3.4 mmol/L — AB (ref 3.5–5.1)
Sodium: 137 mmol/L (ref 135–145)
TOTAL PROTEIN: 6.5 g/dL (ref 6.5–8.1)
Total Bilirubin: 0.5 mg/dL (ref 0.3–1.2)

## 2015-01-15 LAB — LIPASE, BLOOD: Lipase: 13 U/L — ABNORMAL LOW (ref 22–51)

## 2015-01-15 LAB — HEPATIC FUNCTION PANEL
ALK PHOS: 128 U/L — AB (ref 38–126)
ALT: 15 U/L (ref 14–54)
AST: 23 U/L (ref 15–41)
Albumin: 3.1 g/dL — ABNORMAL LOW (ref 3.5–5.0)
BILIRUBIN DIRECT: 0.1 mg/dL (ref 0.1–0.5)
Indirect Bilirubin: 0.3 mg/dL (ref 0.3–0.9)
TOTAL PROTEIN: 7 g/dL (ref 6.5–8.1)
Total Bilirubin: 0.4 mg/dL (ref 0.3–1.2)

## 2015-01-15 MED ORDER — LEVOTHYROXINE SODIUM 25 MCG PO TABS
25.0000 ug | ORAL_TABLET | Freq: Every day | ORAL | Status: DC
  Administered 2015-01-15 – 2015-01-20 (×6): 25 ug via ORAL
  Filled 2015-01-15 (×6): qty 1

## 2015-01-15 MED ORDER — VANCOMYCIN HCL IN DEXTROSE 750-5 MG/150ML-% IV SOLN
750.0000 mg | Freq: Once | INTRAVENOUS | Status: AC
  Administered 2015-01-15: 750 mg via INTRAVENOUS
  Filled 2015-01-15: qty 150

## 2015-01-15 MED ORDER — PYRAZINAMIDE 500 MG PO TABS
1000.0000 mg | ORAL_TABLET | Freq: Every day | ORAL | Status: DC
  Administered 2015-01-15 – 2015-01-20 (×6): 1000 mg via ORAL
  Filled 2015-01-15 (×9): qty 2

## 2015-01-15 MED ORDER — PIPERACILLIN-TAZOBACTAM 3.375 G IVPB
3.3750 g | Freq: Once | INTRAVENOUS | Status: AC
  Administered 2015-01-15: 3.375 g via INTRAVENOUS
  Filled 2015-01-15: qty 50

## 2015-01-15 MED ORDER — ETHAMBUTOL HCL 400 MG PO TABS
800.0000 mg | ORAL_TABLET | Freq: Every day | ORAL | Status: DC
  Administered 2015-01-15 – 2015-01-20 (×6): 800 mg via ORAL
  Filled 2015-01-15 (×9): qty 2

## 2015-01-15 MED ORDER — ONDANSETRON HCL 4 MG PO TABS: 4.0000 mg | ORAL_TABLET | Freq: Four times a day (QID) | ORAL | Status: DC | PRN

## 2015-01-15 MED ORDER — ALBUTEROL SULFATE (2.5 MG/3ML) 0.083% IN NEBU
2.5000 mg | INHALATION_SOLUTION | RESPIRATORY_TRACT | Status: DC | PRN
  Administered 2015-01-15: 2.5 mg via RESPIRATORY_TRACT
  Filled 2015-01-15: qty 3

## 2015-01-15 MED ORDER — SODIUM CHLORIDE 0.9 % IV BOLUS (SEPSIS)
500.0000 mL | Freq: Once | INTRAVENOUS | Status: AC
  Administered 2015-01-15: 01:00:00 via INTRAVENOUS

## 2015-01-15 MED ORDER — METOPROLOL TARTRATE 25 MG PO TABS
12.5000 mg | ORAL_TABLET | Freq: Two times a day (BID) | ORAL | Status: DC
  Administered 2015-01-15 – 2015-01-20 (×11): 12.5 mg via ORAL
  Filled 2015-01-15 (×11): qty 1

## 2015-01-15 MED ORDER — TOPIRAMATE 25 MG PO TABS
25.0000 mg | ORAL_TABLET | Freq: Two times a day (BID) | ORAL | Status: DC
  Administered 2015-01-15 – 2015-01-20 (×11): 25 mg via ORAL
  Filled 2015-01-15 (×17): qty 1

## 2015-01-15 MED ORDER — IOHEXOL 300 MG/ML  SOLN
100.0000 mL | Freq: Once | INTRAMUSCULAR | Status: AC | PRN
  Administered 2015-01-15: 100 mL via INTRAVENOUS

## 2015-01-15 MED ORDER — ONDANSETRON HCL 4 MG/2ML IJ SOLN
4.0000 mg | Freq: Four times a day (QID) | INTRAMUSCULAR | Status: DC | PRN
  Administered 2015-01-20: 4 mg via INTRAVENOUS
  Filled 2015-01-15: qty 2

## 2015-01-15 MED ORDER — ISONIAZID 300 MG PO TABS
150.0000 mg | ORAL_TABLET | Freq: Every day | ORAL | Status: DC
  Administered 2015-01-15 – 2015-01-20 (×6): 150 mg via ORAL
  Filled 2015-01-15 (×2): qty 2
  Filled 2015-01-15 (×7): qty 1

## 2015-01-15 MED ORDER — PIPERACILLIN-TAZOBACTAM 3.375 G IVPB
INTRAVENOUS | Status: AC
  Filled 2015-01-15: qty 50

## 2015-01-15 MED ORDER — DIAZEPAM 2 MG PO TABS
2.0000 mg | ORAL_TABLET | Freq: Four times a day (QID) | ORAL | Status: DC | PRN
  Administered 2015-01-15 – 2015-01-20 (×14): 2 mg via ORAL
  Filled 2015-01-15 (×14): qty 1

## 2015-01-15 MED ORDER — MEMANTINE HCL 10 MG PO TABS
10.0000 mg | ORAL_TABLET | Freq: Two times a day (BID) | ORAL | Status: DC
  Administered 2015-01-15 – 2015-01-20 (×11): 10 mg via ORAL
  Filled 2015-01-15 (×11): qty 1

## 2015-01-15 MED ORDER — ISONIAZID 300 MG PO TABS
300.0000 mg | ORAL_TABLET | Freq: Every day | ORAL | Status: DC
  Filled 2015-01-15 (×2): qty 1

## 2015-01-15 MED ORDER — PANTOPRAZOLE SODIUM 40 MG PO TBEC
40.0000 mg | DELAYED_RELEASE_TABLET | Freq: Every day | ORAL | Status: DC
  Administered 2015-01-15 – 2015-01-20 (×6): 40 mg via ORAL
  Filled 2015-01-15 (×6): qty 1

## 2015-01-15 MED ORDER — RIFAMPIN 300 MG PO CAPS
300.0000 mg | ORAL_CAPSULE | Freq: Every day | ORAL | Status: DC
  Administered 2015-01-15 – 2015-01-20 (×6): 300 mg via ORAL
  Filled 2015-01-15 (×9): qty 1

## 2015-01-15 MED ORDER — ENSURE ENLIVE PO LIQD
237.0000 mL | Freq: Two times a day (BID) | ORAL | Status: DC
  Administered 2015-01-16: 237 mL via ORAL

## 2015-01-15 MED ORDER — VANCOMYCIN HCL 500 MG IV SOLR
500.0000 mg | Freq: Once | INTRAVENOUS | Status: DC
  Filled 2015-01-15: qty 500

## 2015-01-15 MED ORDER — SODIUM CHLORIDE 0.9 % IV SOLN
500.0000 mg | Freq: Two times a day (BID) | INTRAVENOUS | Status: DC
  Administered 2015-01-15 – 2015-01-16 (×3): 500 mg via INTRAVENOUS
  Filled 2015-01-15 (×6): qty 500

## 2015-01-15 MED ORDER — SODIUM CHLORIDE 0.9 % IV SOLN
INTRAVENOUS | Status: DC
  Administered 2015-01-15 – 2015-01-16 (×2): via INTRAVENOUS
  Administered 2015-01-17: 1 mL via INTRAVENOUS
  Administered 2015-01-18: 75 mL via INTRAVENOUS
  Administered 2015-01-19: 14:00:00 via INTRAVENOUS

## 2015-01-15 MED ORDER — ACETAMINOPHEN 325 MG PO TABS
650.0000 mg | ORAL_TABLET | Freq: Four times a day (QID) | ORAL | Status: DC | PRN
  Administered 2015-01-15 – 2015-01-20 (×9): 650 mg via ORAL
  Filled 2015-01-15 (×9): qty 2

## 2015-01-15 MED ORDER — VENLAFAXINE HCL ER 75 MG PO CP24
75.0000 mg | ORAL_CAPSULE | Freq: Every day | ORAL | Status: DC
  Administered 2015-01-15 – 2015-01-20 (×6): 75 mg via ORAL
  Filled 2015-01-15 (×6): qty 1

## 2015-01-15 MED ORDER — ALBUTEROL SULFATE (2.5 MG/3ML) 0.083% IN NEBU
2.5000 mg | INHALATION_SOLUTION | Freq: Two times a day (BID) | RESPIRATORY_TRACT | Status: DC
  Administered 2015-01-15 – 2015-01-18 (×7): 2.5 mg via RESPIRATORY_TRACT
  Filled 2015-01-15 (×8): qty 3

## 2015-01-15 MED ORDER — VITAMIN B-6 50 MG PO TABS
50.0000 mg | ORAL_TABLET | Freq: Every day | ORAL | Status: DC
  Administered 2015-01-15 – 2015-01-20 (×6): 50 mg via ORAL
  Filled 2015-01-15 (×6): qty 1

## 2015-01-15 MED ORDER — CITALOPRAM HYDROBROMIDE 20 MG PO TABS
10.0000 mg | ORAL_TABLET | Freq: Every day | ORAL | Status: DC
  Administered 2015-01-15 – 2015-01-20 (×6): 10 mg via ORAL
  Filled 2015-01-15 (×6): qty 1

## 2015-01-15 MED ORDER — PIPERACILLIN-TAZOBACTAM 3.375 G IVPB
3.3750 g | Freq: Three times a day (TID) | INTRAVENOUS | Status: DC
  Administered 2015-01-15 – 2015-01-17 (×6): 3.375 g via INTRAVENOUS
  Filled 2015-01-15 (×10): qty 50

## 2015-01-15 MED ORDER — ACETAMINOPHEN 650 MG RE SUPP: 650.0000 mg | Freq: Four times a day (QID) | RECTAL | Status: DC | PRN

## 2015-01-15 MED ORDER — FLUTICASONE PROPIONATE 50 MCG/ACT NA SUSP
1.0000 | Freq: Every day | NASAL | Status: DC | PRN
  Administered 2015-01-17: 1 via NASAL
  Filled 2015-01-15: qty 16

## 2015-01-15 MED ORDER — VANCOMYCIN HCL 500 MG IV SOLR
INTRAVENOUS | Status: AC
  Filled 2015-01-15: qty 500

## 2015-01-15 NOTE — ED Notes (Signed)
Patient assisted to restroom in wheelchair.

## 2015-01-15 NOTE — Progress Notes (Signed)
ANTIBIOTIC CONSULT NOTE-Preliminary  Pharmacy Consult for Vancomycin and Zosyn Indication: Pneumonia  Allergies  Allergen Reactions  . Tetanus Toxoid Hives and Swelling  . Zolpidem Tartrate     Not in right state of mind  . Fosamax [Alendronate Sodium] Other (See Comments)    Severe gastritis reflux.    Patient Measurements: Height: 4' 11.5" (151.1 cm) Weight: 84 lb (38.102 kg) IBW/kg (Calculated) : 44.35  Vital Signs: Temp: 98.2 F (36.8 C) (07/03 0241) Temp Source: Oral (07/03 0241) BP: 163/81 mmHg (07/03 0241) Pulse Rate: 98 (07/03 0241)  Labs:  Recent Labs  01/12/15 1420 01/14/15 2255  WBC  --  13.7*  HGB  --  12.3  PLT  --  348  CREATININE 0.96 1.13*    Estimated Creatinine Clearance: 22.7 mL/min (by C-G formula based on Cr of 1.13).  No results for input(s): VANCOTROUGH, VANCOPEAK, VANCORANDOM, GENTTROUGH, GENTPEAK, GENTRANDOM, TOBRATROUGH, TOBRAPEAK, TOBRARND, AMIKACINPEAK, AMIKACINTROU, AMIKACIN in the last 72 hours.   Microbiology: No results found for this or any previous visit (from the past 720 hour(s)).  Medical History: Past Medical History  Diagnosis Date  . Lower abdominal pain   . Gastroesophageal reflux disease   . IBS (irritable bowel syndrome)   . Migraines   . GAD (generalized anxiety disorder)   . Anemia   . Osteoporosis   . Renal insufficiency   . Tachycardia   . Chronic abdominal pain   . Gastroparesis   . COPD (chronic obstructive pulmonary disease)   . Lung mass   . Chronic neck and back pain     Medications:   Assessment: 81 yo female with hx of tuberculosis presented to the ED with hemoptysis x 1 day. Pt reports 30 lb weight loss over several months and night sweats. Empiric antibiotics for pneumonia pending r/o TB.  Goal of Therapy:  Vancomycin troughs 15-20 mcg/ml Eradicate infection  Plan:  Preliminary review of pertinent patient information completed.  Protocol will be initiated with one-time doses of Zosyn  3.375 Gm IV and Vancomycin 500 mg IV.  Forestine Na clinical pharmacist will complete review during morning rounds to assess patient and finalize treatment regimen.  Norberto Sorenson, [facility] 01/15/2015,5:16 AM

## 2015-01-15 NOTE — H&P (Signed)
PCP:   [name] Lange, MD   Chief Complaint:  Hemoptysis  History of present illness 81 year old female with a history of gastroparesis, COPD, tuberculosis, treated 20 years ago came to the hospital with hemoptysis which started last night. Patient says that she coughed up several times and every time she coughed up clots of blood. Patient denies any shortness of breath or chest pain. Patient has had GI bleed in the past but this time she was coughing and was not vomiting. Denies nausea vomiting or diarrhea. Patient had a PET scan in April which showed a retrocardiac this segment of left lower lobe which was hypermetabolic. Patient has been followed by Dr. Luan Pulling as outpatient. Course in the ED CT chest shows complex bilateral airspace disease and scarring, concern for active tuberculosis. Patient says that she has lost about 30 pounds weight loss over past few months, has poor appetite and also has been having night sweats   Allergies:   Allergies  Allergen Reactions  . Tetanus Toxoid Hives and Swelling  . Zolpidem Tartrate     Not in right state of mind  . Fosamax [Alendronate Sodium] Other (See Comments)    Severe gastritis reflux.      Past Medical History  Diagnosis Date  . Lower abdominal pain   . Gastroesophageal reflux disease   . IBS (irritable bowel syndrome)   . Migraines   . GAD (generalized anxiety disorder)   . Anemia   . Osteoporosis   . Renal insufficiency   . Tachycardia   . Chronic abdominal pain   . Gastroparesis   . COPD (chronic obstructive pulmonary disease)   . Lung mass   . Chronic neck and back pain     Past Surgical History  Procedure Laterality Date  . Stomach surgery for a bleeding ulcer when she was in her 52s    . Gastrectomy    . Tonsillectomy    . Appendectomy    . Cholecystectomy    . Abdominal hysterectomy    . Esophagogastroduodenoscopy      Prior to Admission medications   Medication Sig Start Date End Date Taking?  Authorizing Provider  acetaminophen (TYLENOL) 500 MG tablet Take 500 mg by mouth daily as needed for headache.    Yes Historical Provider, MD  ATROVENT HFA 17 MCG/ACT inhaler 2 PUFFS FOUR TIMES DAILY. Patient taking differently: TAKE TWO PUFFS VIA INHALATION TWICE DAILY AS NEEDED 08/23/14  Yes Kathyrn Drown, MD  beta carotene w/minerals (OCUVITE) tablet Take 1 tablet by mouth daily.   Yes Historical Provider, MD  calcium carbonate (OS-CAL) 600 MG TABS tablet Take by mouth daily.   Yes Historical Provider, MD  citalopram (CELEXA) 10 MG tablet TAKE (1) TABLET BY MOUTH ONCE DAILY. 12/07/14  Yes Kathyrn Drown, MD  diazepam (VALIUM) 5 MG tablet TAKE 1/2 TABLET EVERY 6 HOURS AS NEEDED AND 1 TABLET AT BEDTIME. 12/01/14  Yes Kathyrn Drown, MD  levothyroxine (SYNTHROID, LEVOTHROID) 25 MCG tablet TAKE 2 TABLETS ON MONDAY AND FRIDAY AND ONE TABLET ALL OTHER DAYS. 12/02/14  Yes Mikey Kirschner, MD  memantine (NAMENDA) 10 MG tablet TAKE 1 TABLET TWICE A DAY FOR DEMENTIA. 12/02/14  Yes Mikey Kirschner, MD  metoprolol tartrate (LOPRESSOR) 25 MG tablet Take one half tablet BID 09/13/14  Yes Kathyrn Drown, MD  Multiple Vitamin (MULTIVITAMIN) tablet Take 1 tablet by mouth daily.   Yes Historical Provider, MD  nystatin (MYCOSTATIN) 100000 UNIT/ML suspension Take 5 mLs by mouth  4 (four) times daily as needed (TRUSH).  08/23/14  Yes Historical Provider, MD  pantoprazole (PROTONIX) 40 MG tablet TAKE ONE TABLET DAILY FOR ACID REFLUX. 12/02/14  Yes Mikey Kirschner, MD  potassium chloride (K-DUR) 10 MEQ tablet TAKE (1) TABLET BY MOUTH ONCE DAILY. 12/02/14  Yes Mikey Kirschner, MD  topiramate (TOPAMAX) 25 MG tablet Take 1 tablet (25 mg total) by mouth 2 (two) times daily. 12/09/14  Yes Kathyrn Drown, MD  venlafaxine XR (EFFEXOR-XR) 75 MG 24 hr capsule Take 1 capsule (75 mg total) by mouth daily with breakfast. 09/13/14  Yes Kathyrn Drown, MD  vitamin E 400 UNIT capsule Take 400 Units by mouth daily.   Yes Historical Provider, MD    doxycycline (VIBRAMYCIN) 100 MG capsule Take 1 capsule (100 mg total) by mouth 2 (two) times daily. 12/09/14   Kathyrn Drown, MD  fluconazole (DIFLUCAN) 150 MG tablet Take 1 tablet (150 mg total) by mouth daily. 3 days apart Patient not taking: Reported on 12/09/2014 12/05/14   Kathyrn Drown, MD  fluticasone (FLONASE) 50 MCG/ACT nasal spray Place 1 spray into both nostrils daily as needed for allergies.     Historical Provider, MD  HYDROcodone-acetaminophen (NORCO/VICODIN) 5-325 MG per tablet TAKE (1) TABLET EVERY FOUR HOURS AS NEEDED. Patient not taking: Reported on 10/18/2014 08/03/14   Kathyrn Drown, MD    Social History:  reports that she has never smoked. She has never used smokeless tobacco. She reports that she does not drink alcohol or use illicit drugs.  Family History  Problem Relation Age of Onset  . Stroke Mother   . Stroke Father   . Diabetes Sister   . Diabetes Brother   . Stroke Brother   . Lung cancer Sister   . Diabetes Sister   . Emphysema Sister   . Diabetes Brother   . Stroke Brother   . Diabetes Son   . Irritable bowel syndrome Son   . Diabetes Son   . Hypertension Son     Danley Danker Weights   01/14/15 2136  Weight: 38.102 kg (84 lb)    All the positives are listed in BOLD  Review of Systems:  HEENT: Headache, blurred vision, runny nose, sore throat Neck: Hypothyroidism, hyperthyroidism,,lymphadenopathy Chest : Shortness of breath, history of COPD, Asthma, tuberculosis Heart : Chest pain, history of coronary arterey disease GI:  Nausea, vomiting, diarrhea, constipation, GERD GU: Dysuria, urgency, frequency of urination, hematuria Neuro: Stroke, seizures, syncope Psych: Depression, anxiety, hallucinations   Physical Exam: Blood pressure 163/81, pulse 98, temperature 98.2 F (36.8 C), temperature source Oral, resp. rate 16, height 4' 11.5" (1.511 m), weight 38.102 kg (84 lb), SpO2 99 %. Constitutional:   Patient is malnourished appearing female in no  acute distress and cooperative with exam. Head: Normocephalic and atraumatic Mouth: Mucus membranes moist Eyes: PERRL, EOMI, conjunctivae normal Neck: Supple, No Thyromegaly Cardiovascular: RRR, S1 normal, S2 normal Pulmonary/Chest: Decreased breath sounds at the lung bases Abdominal: Soft. Non-tender, non-distended, bowel sounds are normal, no masses, organomegaly, or guarding present. Ileostomy bag in place  Neurological: A&O x3, Strength is normal and symmetric bilaterally, cranial nerve II-XII are grossly intact, no focal motor deficit, sensory intact to light touch bilaterally.  Extremities : No Cyanosis, Clubbing or Edema  Labs on Admission:  Basic Metabolic Panel:  Recent Labs Lab 01/12/15 1420 01/14/15 2255  NA 134* 137  K 4.5 4.3  CL 101 101  CO2 27 28  GLUCOSE 121* 105*  BUN 13 12  CREATININE 0.96 1.13*  CALCIUM 9.2 8.6*  PHOS 2.9  --    Liver Function Tests:  Recent Labs Lab 01/12/15 1420 01/14/15 2303  AST  --  23  ALT  --  15  ALKPHOS  --  128*  BILITOT  --  0.4  PROT  --  7.0  ALBUMIN 3.3* 3.1*    Recent Labs Lab 01/14/15 2303  LIPASE 13*   No results for input(s): AMMONIA in the last 168 hours. CBC:  Recent Labs Lab 01/14/15 2255  WBC 13.7*  NEUTROABS 7.9*  HGB 12.3  HCT 38.7  MCV 99.0  PLT 348    Radiological Exams on Admission: Ct Chest W Contrast  01/15/2015   CLINICAL DATA:  Subacute onset of cough for 2 weeks. Hemoptysis. Right upper quadrant abdominal pain and umbilical pain. Sore initial  EXAM: CT CHEST WITH CONTRAST  TECHNIQUE: Multidetector CT imaging of the chest was performed during intravenous contrast administration  IMPRESSION: 1. Complex bilateral airspace disease and scarring is mostly similar in appearance, though worsened airspace opacification is noted at the posterior aspect of the left upper lobe and superior aspect of the left lower lobe. Bilateral spiculated nodules are relatively stable. Cavitary lesions at the left  upper and lower lobes are perhaps mildly more prominent, with slightly worsening numerous nodular densities at the left lower lobe. 2. Given mild associated FDG uptake on the prior PET/CT, this most likely reflects acute tuberculosis superimposed on chronic scarring. Scarring alone is unlikely to be associated with FDG uptake. Particularly, the numerous nodular densities at the left lower lobes are unlikely to reflect a typical superimposed pneumonia, and the relatively slow rate of change would be very unusual for lymphangitic spread of tumor. 3. Moderate food-filled hiatal hernia seen. Solid material also noted in the mid to distal esophagus, which may reflect some degree of esophageal dysmotility. 4. Calcification at the aortic and mitral valves, and scattered coronary artery calcifications. 5. Relatively diffuse calcification along the abdominal aorta and its branches. These results were called by telephone at the time of interpretation on 01/15/2015 at 2:16 am to Dr. Thayer Jew, who verbally acknowledged these results.   Electronically Signed   By: Garald Balding M.D.   On: 01/15/2015 02:19   Dg Abd Acute W/chest  01/15/2015   CLINICAL DATA:  Acute onset of right lower quadrant abdominal pain. Hemoptysis. Initial encounter.  EXAM: DG ABDOMEN ACUTE W/ 1V CHEST  COMPARISON:  PET/CT performed 11/11/2014    IMPRESSION: 1. Scattered nodular opacities again noted within both lungs, better characterized on prior PET/CT. Given prior cavitation and only minimal associated FDG uptake, these again likely reflect sequelae of the patient's known tuberculosis. 2. Unremarkable bowel gas pattern; no free intra-abdominal air seen.   Electronically Signed   By: Garald Balding M.D.   On: 01/15/2015 00:45    Assessment/Plan Active Problems:   Ileostomy in place   Weight loss   Abnormal CT scan, chest   Hypothyroidism   Hemoptysis  Hemoptysis Patient presented with hemoptysis with CT chest showing bilateral  airspace disease and scarring, worsened airspace opacification in the posterior aspect of left upper lobe and superior aspect of left lower lobe. There is a concern for possible tuberculosis as patient has a prior history of TB. I will empirically start the patient on vancomycin and Zosyn for possible pneumonia. Will obtain blood cultures, sputum culture. We'll also obtain QuantiFERON gold test. Airborne precautions.  History of tuberculosis As above  will obtain Quantifron gold test. Consult ID in a.m. for further recommendations.  Hypothyroidism Continue Synthroid  DVT prophylaxis Lovenox  Code status: Full code  Family discussion: No family at bedside   Time Spent on Admission: 60 min  [facility] Pager: [phone] 01/15/2015, 3:34 AM  If 7PM-7AM, please contact night-coverage  [email or web address]  Password [email or web address]

## 2015-01-15 NOTE — Progress Notes (Signed)
ANTIBIOTIC CONSULT NOTE - INITIAL  Pharmacy Consult for Vancomycin, Zosyn, Rifampin, Isoniazid, Pyrazinamide, Ethambutol  Indication: HCAP / active tuberculosis / COPD / Hemoptysis  Allergies  Allergen Reactions  . Tetanus Toxoid Hives and Swelling  . Zolpidem Tartrate     Not in right state of mind  . Fosamax [Alendronate Sodium] Other (See Comments)    Severe gastritis reflux.   Patient Measurements: Height: '4\' 11"'$  (149.9 cm) Weight: 79 lb (35.834 kg) IBW/kg (Calculated) : 43.2 Dosing weight 36Kg  Vital Signs: Temp: 98 F (36.7 C) (07/03 0430) Temp Source: Oral (07/03 0430) BP: 178/89 mmHg (07/03 0430) Pulse Rate: 110 (07/03 0430) Intake/Output from previous day:   Intake/Output from this shift:    Labs:  Recent Labs  01/12/15 1420 01/14/15 2255 01/15/15 0659  WBC  --  13.7* 11.9*  HGB  --  12.3 12.3  PLT  --  348 371  CREATININE 0.96 1.13* 0.88   Estimated Creatinine Clearance: 27.4 mL/min (by C-G formula based on Cr of 0.88). No results for input(s): VANCOTROUGH, VANCOPEAK, VANCORANDOM, GENTTROUGH, GENTPEAK, GENTRANDOM, TOBRATROUGH, TOBRAPEAK, TOBRARND, AMIKACINPEAK, AMIKACINTROU, AMIKACIN in the last 72 hours.   Microbiology: Recent Results (from the past 720 hour(s))  Culture, blood (routine x 2)     Status: None (Preliminary result)   Collection Time: 01/15/15  4:35 AM  Result Value Ref Range Status   Specimen Description RIGHT ANTECUBITAL  Final   Special Requests BOTTLES DRAWN AEROBIC ONLY [place]  Final   Culture NO GROWTH < 12 HOURS  Final   Report Status PENDING  Incomplete  Culture, blood (routine x 2)     Status: None (Preliminary result)   Collection Time: 01/15/15  4:41 AM  Result Value Ref Range Status   Specimen Description BLOOD RIGHT ARM  Final   Special Requests BOTTLES DRAWN AEROBIC ONLY [place]  Final   Culture NO GROWTH < 12 HOURS  Final   Report Status PENDING  Incomplete   Medical History: Past Medical History  Diagnosis Date  .  Lower abdominal pain   . Gastroesophageal reflux disease   . IBS (irritable bowel syndrome)   . Migraines   . GAD (generalized anxiety disorder)   . Anemia   . Osteoporosis   . Renal insufficiency   . Tachycardia   . Chronic abdominal pain   . Gastroparesis   . COPD (chronic obstructive pulmonary disease)   . Lung mass   . Chronic neck and back pain    VANCOMYCIN 7/3 >> ZOSYN 7/3 >> RIFAMPIN 7/3 >> ISONIAZIDE 7/3 >> PYRAZINAMIDE 7/3 >> ETHAMBUTOL 7/3 >>  Assessment: 81yo female who has lost about 30 pounds over the past few months due to poor appetite.  Pt c/o COPD and h/o TB.  Pt states she has been coughing up clots of blood.  Antibiotics started for HCAP and active TB.  SCr is at baseline.  Pt has very small body habitus.   Estimated Normalized clcr ~ 45-73m/min Discussed with Dr HLuan Pulling Dr HJerilee Hoh and JHeide Guile PharmD (ID svc) Anticipate will be changed to 2-3 times WEEKLY dosing upon discharge and f/u with Health Dept.   Goal of Therapy:  Vancomycin trough level 15-20 mcg/ml Eradicate infection.  Plan:   Vancomycin '750mg'$  IV now x 1 dose then  Vancomycin '500mg'$  IV q12hrs  Check trough at steady state  Zosyn 3.375gm IV q8h, each dose over 4 hrs  Rifampin '300mg'$  po daily (~ '10mg'$ /Kg)  Isoniazid '150mg'$  po daily (~ '5mg'$ /Kg) - w/ Pyridoxine  $'50mg'x$  daily  Ethambutol '800mg'$  po daily (guideline dosing)  Pyrazinamide '1000mg'$  po daily (guideline dosing)  Monitor labs, LFT's, progress, renal fxn, and c/s  [place] Crane, Marlys Stegmaier A 01/15/2015,9:59 AM

## 2015-01-15 NOTE — ED Notes (Signed)
Patient states that she is not having any pain at this time. States that she is hungry and thirsty. Will check with EDP to see if she can have frozen meal.

## 2015-01-15 NOTE — Progress Notes (Signed)
Patient briefly seen and examined. Chart reviewed and discussed with pulmonary Dr. Luan Pulling and infectious diseases Dr. Johnnye Sima. Very high suspicion for active tuberculosis. Continue airborne isolation, order for AFB sputum 3. We'll start treatment for presumed tuberculosis with rifampin, isoniazid, ethambutol, pyrazinamide, pyridoxine. Will need follow-up with the health department. We'll continue to follow.  Domingo Mend, MD [facility] Pager: [phone]

## 2015-01-15 NOTE — ED Provider Notes (Signed)
CSN: [id number]     Arrival date & time 01/14/15  2128 History   First MD Initiated Contact with Patient 01/14/15 2305     Chief Complaint  Patient presents with  . Hemoptysis     (Consider location/radiation/quality/duration/timing/severity/associated sxs/prior Treatment) HPI  This is an 81 year old female with multiple medical problems including renal insufficiency, gastroparesis, chronic abdominal pain, COPD, history of tuberculosis who presents with hemoptysis. Patient reports a one-day history of coughing up blood. Her caregivers at the bedside and reports that she has coughed up at times "clots of blood." She denies any shortness of breath or chest pain. She denies any leg swelling. She's not on any blood thinners. She states that she has had no GI bleed before but this is different she's coughing and not vomiting. Denies any fevers. She thinks her bronchitis may be acting up and has been taking breathing treatments at home. Patient reports abdominal pain. This is chronic. Denies any nausea, vomiting, diarrhea.  Past Medical History  Diagnosis Date  . Lower abdominal pain   . Gastroesophageal reflux disease   . IBS (irritable bowel syndrome)   . Migraines   . GAD (generalized anxiety disorder)   . Anemia   . Osteoporosis   . Renal insufficiency   . Tachycardia   . Chronic abdominal pain   . Gastroparesis   . COPD (chronic obstructive pulmonary disease)   . Lung mass   . Chronic neck and back pain    Past Surgical History  Procedure Laterality Date  . Stomach surgery for a bleeding ulcer when she was in her 23s    . Gastrectomy    . Tonsillectomy    . Appendectomy    . Cholecystectomy    . Abdominal hysterectomy    . Esophagogastroduodenoscopy     Family History  Problem Relation Age of Onset  . Stroke Mother   . Stroke Father   . Diabetes Sister   . Diabetes Brother   . Stroke Brother   . Lung cancer Sister   . Diabetes Sister   . Emphysema Sister   . Diabetes  Brother   . Stroke Brother   . Diabetes Son   . Irritable bowel syndrome Son   . Diabetes Son   . Hypertension Son    History  Substance Use Topics  . Smoking status: Never Smoker   . Smokeless tobacco: Never Used  . Alcohol Use: No   OB History    Gravida Para Term Preterm AB TAB SAB Ectopic Multiple Living            3     Review of Systems  Constitutional: Negative for fever.  Respiratory: Positive for cough. Negative for chest tightness and shortness of breath.        Hemoptysis  Cardiovascular: Negative for chest pain and leg swelling.  Gastrointestinal: Positive for abdominal pain. Negative for nausea and vomiting.  Genitourinary: Negative for dysuria.  Musculoskeletal: Negative for back pain.  Neurological: Negative for headaches.  All other systems reviewed and are negative.     Allergies  Tetanus toxoid; Zolpidem tartrate; and Fosamax  Home Medications   Prior to Admission medications   Medication Sig Start Date End Date Taking? Authorizing Provider  acetaminophen (TYLENOL) 500 MG tablet Take 500 mg by mouth daily as needed for headache.    Yes Historical Provider, MD  ATROVENT HFA 17 MCG/ACT inhaler 2 PUFFS FOUR TIMES DAILY. Patient taking differently: TAKE TWO PUFFS VIA INHALATION TWICE DAILY AS  NEEDED 08/23/14  Yes Kathyrn Drown, MD  beta carotene w/minerals (OCUVITE) tablet Take 1 tablet by mouth daily.   Yes Historical Provider, MD  calcium carbonate (OS-CAL) 600 MG TABS tablet Take by mouth daily.   Yes Historical Provider, MD  citalopram (CELEXA) 10 MG tablet TAKE (1) TABLET BY MOUTH ONCE DAILY. 12/07/14  Yes Kathyrn Drown, MD  diazepam (VALIUM) 5 MG tablet TAKE 1/2 TABLET EVERY 6 HOURS AS NEEDED AND 1 TABLET AT BEDTIME. 12/01/14  Yes Kathyrn Drown, MD  levothyroxine (SYNTHROID, LEVOTHROID) 25 MCG tablet TAKE 2 TABLETS ON MONDAY AND FRIDAY AND ONE TABLET ALL OTHER DAYS. 12/02/14  Yes Mikey Kirschner, MD  memantine (NAMENDA) 10 MG tablet TAKE 1 TABLET  TWICE A DAY FOR DEMENTIA. 12/02/14  Yes Mikey Kirschner, MD  metoprolol tartrate (LOPRESSOR) 25 MG tablet Take one half tablet BID 09/13/14  Yes Kathyrn Drown, MD  Multiple Vitamin (MULTIVITAMIN) tablet Take 1 tablet by mouth daily.   Yes Historical Provider, MD  nystatin (MYCOSTATIN) 100000 UNIT/ML suspension Take 5 mLs by mouth 4 (four) times daily as needed (TRUSH).  08/23/14  Yes Historical Provider, MD  pantoprazole (PROTONIX) 40 MG tablet TAKE ONE TABLET DAILY FOR ACID REFLUX. 12/02/14  Yes Mikey Kirschner, MD  potassium chloride (K-DUR) 10 MEQ tablet TAKE (1) TABLET BY MOUTH ONCE DAILY. 12/02/14  Yes Mikey Kirschner, MD  topiramate (TOPAMAX) 25 MG tablet Take 1 tablet (25 mg total) by mouth 2 (two) times daily. 12/09/14  Yes Kathyrn Drown, MD  venlafaxine XR (EFFEXOR-XR) 75 MG 24 hr capsule Take 1 capsule (75 mg total) by mouth daily with breakfast. 09/13/14  Yes Kathyrn Drown, MD  vitamin E 400 UNIT capsule Take 400 Units by mouth daily.   Yes Historical Provider, MD  doxycycline (VIBRAMYCIN) 100 MG capsule Take 1 capsule (100 mg total) by mouth 2 (two) times daily. 12/09/14   Kathyrn Drown, MD  fluconazole (DIFLUCAN) 150 MG tablet Take 1 tablet (150 mg total) by mouth daily. 3 days apart Patient not taking: Reported on 12/09/2014 12/05/14   Kathyrn Drown, MD  fluticasone (FLONASE) 50 MCG/ACT nasal spray Place 1 spray into both nostrils daily as needed for allergies.     Historical Provider, MD  HYDROcodone-acetaminophen (NORCO/VICODIN) 5-325 MG per tablet TAKE (1) TABLET EVERY FOUR HOURS AS NEEDED. Patient not taking: Reported on 10/18/2014 08/03/14   Kathyrn Drown, MD   BP 142/70 mmHg  Pulse 75  Temp(Src) 98.7 F (37.1 C) (Oral)  Resp 16  Ht 4' 11.5" (1.511 m)  Wt 84 lb (38.102 kg)  BMI 16.69 kg/m2  SpO2 96% Physical Exam  Constitutional: She is oriented to person, place, and time. No distress.  Elderly, thin, frail  HENT:  Head: Normocephalic and atraumatic.  Cardiovascular: Normal  rate, regular rhythm and normal heart sounds.   No murmur heard. Pulmonary/Chest: Effort normal and breath sounds normal. No respiratory distress. She has no wheezes. She has no rales.  Abdominal: Soft. Bowel sounds are normal. There is no tenderness. There is no rebound.  Ostomy in the right upper quadrant  Musculoskeletal: She exhibits no edema.  Neurological: She is alert and oriented to person, place, and time.  Skin: Skin is warm and dry.  Psychiatric: She has a normal mood and affect.  Nursing note and vitals reviewed.   ED Course  Procedures (including critical care time) Labs Review Labs Reviewed  BASIC METABOLIC PANEL - Abnormal; Notable for  the following:    Glucose, Bld 105 (*)    Creatinine, Ser 1.13 (*)    Calcium 8.6 (*)    GFR calc non Af Amer 44 (*)    GFR calc Af Amer 51 (*)    All other components within normal limits  CBC WITH DIFFERENTIAL/PLATELET - Abnormal; Notable for the following:    WBC 13.7 (*)    Neutro Abs 7.9 (*)    Lymphs Abs 4.2 (*)    Monocytes Absolute 1.2 (*)    All other components within normal limits  HEPATIC FUNCTION PANEL - Abnormal; Notable for the following:    Albumin 3.1 (*)    Alkaline Phosphatase 128 (*)    All other components within normal limits  LIPASE, BLOOD - Abnormal; Notable for the following:    Lipase 13 (*)    All other components within normal limits  CULTURE, EXPECTORATED SPUTUM-ASSESSMENT  PROTIME-INR    Imaging Review Ct Chest W Contrast  01/15/2015   CLINICAL DATA:  Subacute onset of cough for 2 weeks. Hemoptysis. Right upper quadrant abdominal pain and umbilical pain. Sore initial  EXAM: CT CHEST WITH CONTRAST  TECHNIQUE: Multidetector CT imaging of the chest was performed during intravenous contrast administration.  CONTRAST:  164m OMNIPAQUE IOHEXOL 300 MG/ML  SOLN  COMPARISON:  PET/CT performed 11/11/2014  FINDINGS: The patient's complex bilateral airspace disease and scarring is mostly similar in appearance,  though worsened airspace opacification is noted at the posterior aspect of the left upper lobe and superior aspect of the left lower lobe. Multiple bilateral spiculated nodules are relatively stable in appearance, measuring up to 1.6 cm in size.  Cavitary lesions are again seen at the left upper and lower lobes, perhaps mildly more prominent at the left lower lobe. Numerous nodular densities within the left lower lobe are perhaps slightly worsened from the prior study. Underlying pleural parenchymal scarring is noted. Trace loculated fluid is noted near the left lung apex. No pneumothorax is seen.  The findings remain compatible with tuberculosis, given the patient's history of tuberculosis. These appear to have worsened mildly on the left since the prior study. No definite superimposed infection is seen.  A moderate food-filled hiatal hernia is seen. Solid material is also noted within the mid to distal esophagus, which may reflect some degree of esophageal dysmotility.  The heart is borderline normal in size. Calcification is noted at the aortic and mitral valves, and scattered coronary artery calcifications are seen. No definite mediastinal or hilar lymphadenopathy is seen. No pericardial effusion is identified.  The thyroid gland is unremarkable in appearance. No axillary lymphadenopathy is seen.  The visualized portions of the liver are unremarkable. The spleen is absent. Postoperative change is noted about the visualized abdomen. Mild left renal scarring is seen. Relatively diffuse calcification is noted along the abdominal aorta and its branches.  No acute osseous abnormalities are identified. Mild degenerative change is noted at the lower cervical spine.  IMPRESSION: 1. Complex bilateral airspace disease and scarring is mostly similar in appearance, though worsened airspace opacification is noted at the posterior aspect of the left upper lobe and superior aspect of the left lower lobe. Bilateral spiculated  nodules are relatively stable. Cavitary lesions at the left upper and lower lobes are perhaps mildly more prominent, with slightly worsening numerous nodular densities at the left lower lobe. 2. Given mild associated FDG uptake on the prior PET/CT, this most likely reflects acute tuberculosis superimposed on chronic scarring. Scarring alone is unlikely to  be associated with FDG uptake. Particularly, the numerous nodular densities at the left lower lobes are unlikely to reflect a typical superimposed pneumonia, and the relatively slow rate of change would be very unusual for lymphangitic spread of tumor. 3. Moderate food-filled hiatal hernia seen. Solid material also noted in the mid to distal esophagus, which may reflect some degree of esophageal dysmotility. 4. Calcification at the aortic and mitral valves, and scattered coronary artery calcifications. 5. Relatively diffuse calcification along the abdominal aorta and its branches. These results were called by telephone at the time of interpretation on 01/15/2015 at 2:16 am to Dr. Thayer Jew, who verbally acknowledged these results.   Electronically Signed   By: Garald Balding M.D.   On: 01/15/2015 02:19   Dg Abd Acute W/chest  01/15/2015   CLINICAL DATA:  Acute onset of right lower quadrant abdominal pain. Hemoptysis. Initial encounter.  EXAM: DG ABDOMEN ACUTE W/ 1V CHEST  COMPARISON:  PET/CT performed 11/11/2014  FINDINGS: Scattered nodular opacities are again noted within both lungs, better characterized on prior PET/CT. Given prior cavitation and only minimal associated FDG uptake, these again likely reflect sequelae of the patient's known tuberculosis.  No definite superimposed focal airspace consolidation is seen. No pleural effusion or pneumothorax is identified. The cardiomediastinal silhouette remains normal in size.  Diffuse postoperative change is noted about the abdomen. The visualized bowel gas pattern is unremarkable. Scattered stool and air are  seen within the colon; there is no evidence of small bowel dilatation to suggest obstruction. No free intra-abdominal air is identified on the provided upright view.  No acute osseous abnormalities are seen; the sacroiliac joints are unremarkable in appearance.  IMPRESSION: 1. Scattered nodular opacities again noted within both lungs, better characterized on prior PET/CT. Given prior cavitation and only minimal associated FDG uptake, these again likely reflect sequelae of the patient's known tuberculosis. 2. Unremarkable bowel gas pattern; no free intra-abdominal air seen.   Electronically Signed   By: Garald Balding M.D.   On: 01/15/2015 00:45     EKG Interpretation None      MDM   Final diagnoses:  Hemoptysis    Patient presents with hemoptysis.  Frail. Nontoxic on exam.  Afebrile. At times will cough up a small amount of sputum streaked with blood. Lab work obtained. Chest x-ray shows scattered nodular opacities of patient's "known tuberculosis." I discussed this with the patient. Patient reports that she was treated for tuberculosis approximately 40 years ago. She did have a PET CT in April that showed FDG uptake and chronic findings consistent with tuberculosis. Follow-up CT scan tonight shows worsening airspace opacification in the posterior aspect of the left upper lobe and superior aspect of the left lower lobe and more prominent cavitary lesions concerning for more acute component of tuberculosis. Given this, patient was placed on isolation precautions. She is followed by Dr. Luan Pulling. Patient has had significant weight loss although she states that she is now gaining weight. Also reports intermittent night sweats.   Will discuss with admitting hospitalist.      Merryl Hacker, MD 01/15/15 [phone]

## 2015-01-16 LAB — COMPREHENSIVE METABOLIC PANEL
ALK PHOS: 107 U/L (ref 38–126)
ALT: 12 U/L — ABNORMAL LOW (ref 14–54)
ANION GAP: 7 (ref 5–15)
AST: 17 U/L (ref 15–41)
Albumin: 2.6 g/dL — ABNORMAL LOW (ref 3.5–5.0)
BUN: 12 mg/dL (ref 6–20)
CO2: 30 mmol/L (ref 22–32)
Calcium: 7.4 mg/dL — ABNORMAL LOW (ref 8.9–10.3)
Chloride: 104 mmol/L (ref 101–111)
Creatinine, Ser: 0.94 mg/dL (ref 0.44–1.00)
GFR calc non Af Amer: 55 mL/min — ABNORMAL LOW (ref >60)
Glucose, Bld: 83 mg/dL (ref 65–99)
Potassium: 3.3 mmol/L — ABNORMAL LOW (ref 3.5–5.1)
Sodium: 141 mmol/L (ref 135–145)
Total Bilirubin: 0.5 mg/dL (ref 0.3–1.2)
Total Protein: 6.3 g/dL — ABNORMAL LOW (ref 6.5–8.1)

## 2015-01-16 MED ORDER — BOOST / RESOURCE BREEZE PO LIQD
1.0000 | Freq: Two times a day (BID) | ORAL | Status: DC
  Administered 2015-01-16 – 2015-01-20 (×5): 1 via ORAL

## 2015-01-16 NOTE — Progress Notes (Signed)
Initial Nutrition Assessment  DOCUMENTATION CODES: Underweight  INTERVENTION: Resource Breeze po BID, each supplement provides 250 kcal and 9 grams of protein  Snacks per pt request  Recommend MVI with minerals  NUTRITION DIAGNOSIS: Underweight related to inadequate oral intake as evidenced by BMI of 16.  GOAL: Patient will meet greater than or equal to 90% of their needs  MONITOR: PO intake, Supplement acceptance, Labs, Weight trends, I & O's  REASON FOR ASSESSMENT: Malnutrition Screening Tool    ASSESSMENT: 85-y/o female PMHx gastroparesis, COPD, tuberculosis, presents with hemoptysis. CT chest shows complex bilateral airspace disease and scarring, concern for active tuberculosis. Reports poor appetite and loss of 30 lbs over past few months  Pt is quite tangential and was hard to keep on topic.    Pt reports eating "normally" PTA. Denies n/v/c/d She said she wouldn't have even known she was sick if she hadnt coughed up some blood. She does not cook for herself and has aides throughout the week who help her prepare meals. She takes an MVI. She reports being lactose intolerant. She says she is "supposed" to follow a diet of 3 meals/3 snacks each day, but it doesn't sound like she eats this much.   She reports her normal weight as 79 lbs, but says she weighed as much as 88 last year. She states she has always been very small.   Physical Assessment reveals mild-moderate orbital/temporal/clavicle wasting. However this is not new and unrelated to this acute illness  She says she likes the resource breeze, will continue. Also will order small snacks.   Height: Ht Readings from Last 1 Encounters:  01/15/15 '4\' 11"'$  (1.499 m)    Weight: Wt Readings from Last 1 Encounters:  01/15/15 79 lb (35.834 kg)    Ideal Body Weight:  44.7 kg  Wt Readings from Last 10 Encounters:  01/15/15 79 lb (35.834 kg)  01/12/15 80 lb 6.4 oz (36.469 kg)  12/09/14 80 lb 4 oz (36.401 kg)  11/14/14  81 lb 3.2 oz (36.832 kg)  10/18/14 81 lb 4.8 oz (36.877 kg)  10/12/14 85 lb (38.556 kg)  09/13/14 79 lb 3.2 oz (35.925 kg)  08/30/14 82 lb (37.195 kg)  06/20/14 82 lb 4.8 oz (37.331 kg)  06/01/14 87 lb (39.463 kg)  Loss of a couple pounds over last 3 months?  BMI:  Body mass index is 15.95 kg/(m^2).  Estimated Nutritional Needs: Kcal:  1350-1450 (38-40 kcal/kg) Protein:  54-63 g (1.2-1.4 g/kg ibw) Fluid:  1.4-1.5 liters  Skin:  Pale Dry  Diet Order:  Diet regular Room service appropriate?: Yes; Fluid consistency:: Thin  EDUCATION NEEDS: No education needs identified at this time   Intake/Output Summary (Last 24 hours) at 01/16/15 1117 Last data filed at 01/16/15 0954  Gross per 24 hour  Intake 2516.25 ml  Output    750 ml  Net 1766.25 ml    Last BM: 7/3  Burtis Junes RD, LDN Nutrition Pager: [phone] 01/16/2015 11:17 AM

## 2015-01-16 NOTE — Progress Notes (Signed)
Subjective: She says she feels better. She has no new complaints. She is being treated for TB.  Objective: Vital signs in last 24 hours: Temp:  [98 F (36.7 C)-98.4 F (36.9 C)] 98 F (36.7 C) (07/04 0700) Pulse Rate:  [68-84] 84 (07/04 0700) Resp:  [16-18] 18 (07/04 0700) BP: (138-153)/(49-58) 142/54 mmHg (07/04 0700) SpO2:  [97 %-98 %] 98 % (07/04 0811) Weight change:  Last BM Date: 01/15/15  Intake/Output from previous day: 07/03 0701 - 07/04 0700 In: 2516.3 [P.O.:440; I.V.:1826.3; IV Piggyback:250] Out: 200 [Urine:200]  PHYSICAL EXAM General appearance: alert, cooperative and no distress Resp: clear to auscultation bilaterally Cardio: regular rate and rhythm, S1, S2 normal, no murmur, click, rub or gallop GI: soft, non-tender; bowel sounds normal; no masses,  no organomegaly Extremities: extremities normal, atraumatic, no cyanosis or edema  Lab Results:  Results for orders placed or performed during the hospital encounter of 01/14/15 (from the past 48 hour(s))  Basic metabolic panel     Status: Abnormal   Collection Time: 01/14/15 10:55 PM  Result Value Ref Range   Sodium 137 135 - 145 mmol/L   Potassium 4.3 3.5 - 5.1 mmol/L   Chloride 101 101 - 111 mmol/L   CO2 28 22 - 32 mmol/L   Glucose, Bld 105 (H) 65 - 99 mg/dL   BUN 12 6 - 20 mg/dL   Creatinine, Ser 1.13 (H) 0.44 - 1.00 mg/dL   Calcium 8.6 (L) 8.9 - 10.3 mg/dL   GFR calc non Af Amer 44 (L) >60 mL/min   GFR calc Af Amer 51 (L) >60 mL/min    Comment: (NOTE) The eGFR has been calculated using the CKD EPI equation. This calculation has not been validated in all clinical situations. eGFR's persistently <60 mL/min signify possible Chronic Kidney Disease.    Anion gap 8 5 - 15  CBC with Differential     Status: Abnormal   Collection Time: 01/14/15 10:55 PM  Result Value Ref Range   WBC 13.7 (H) 4.0 - 10.5 K/uL   RBC 3.91 3.87 - 5.11 MIL/uL   Hemoglobin 12.3 12.0 - 15.0 g/dL   HCT 38.7 36.0 - 46.0 %   MCV  99.0 78.0 - 100.0 fL   MCH 31.5 26.0 - 34.0 pg   MCHC 31.8 30.0 - 36.0 g/dL   RDW 14.1 11.5 - 15.5 %   Platelets 348 150 - 400 K/uL   Neutrophils Relative % 57 43 - 77 %   Neutro Abs 7.9 (H) 1.7 - 7.7 K/uL   Lymphocytes Relative 31 12 - 46 %   Lymphs Abs 4.2 (H) 0.7 - 4.0 K/uL   Monocytes Relative 9 3 - 12 %   Monocytes Absolute 1.2 (H) 0.1 - 1.0 K/uL   Eosinophils Relative 3 0 - 5 %   Eosinophils Absolute 0.4 0.0 - 0.7 K/uL   Basophils Relative 0 0 - 1 %   Basophils Absolute 0.1 0.0 - 0.1 K/uL  Protime-INR     Status: None   Collection Time: 01/14/15 10:55 PM  Result Value Ref Range   Prothrombin Time 13.5 11.6 - 15.2 seconds   INR 1.01 0.00 - 1.49  Hepatic function panel     Status: Abnormal   Collection Time: 01/14/15 11:03 PM  Result Value Ref Range   Total Protein 7.0 6.5 - 8.1 g/dL   Albumin 3.1 (L) 3.5 - 5.0 g/dL   AST 23 15 - 41 U/L   ALT 15 14 - 54 U/L  Alkaline Phosphatase 128 (H) 38 - 126 U/L   Total Bilirubin 0.4 0.3 - 1.2 mg/dL   Bilirubin, Direct 0.1 0.1 - 0.5 mg/dL   Indirect Bilirubin 0.3 0.3 - 0.9 mg/dL  Lipase, blood     Status: Abnormal   Collection Time: 01/14/15 11:03 PM  Result Value Ref Range   Lipase 13 (L) 22 - 51 U/L  Culture, blood (routine x 2)     Status: None (Preliminary result)   Collection Time: 01/15/15  4:35 AM  Result Value Ref Range   Specimen Description RIGHT ANTECUBITAL    Special Requests BOTTLES DRAWN AEROBIC ONLY [place]    Culture NO GROWTH < 12 HOURS    Report Status PENDING   Culture, blood (routine x 2)     Status: None (Preliminary result)   Collection Time: 01/15/15  4:41 AM  Result Value Ref Range   Specimen Description BLOOD RIGHT ARM    Special Requests BOTTLES DRAWN AEROBIC ONLY [place]    Culture NO GROWTH < 12 HOURS    Report Status PENDING   CBC     Status: Abnormal   Collection Time: 01/15/15  6:59 AM  Result Value Ref Range   WBC 11.9 (H) 4.0 - 10.5 K/uL   RBC 3.93 3.87 - 5.11 MIL/uL   Hemoglobin 12.3 12.0 - 15.0  g/dL   HCT 38.5 36.0 - 46.0 %   MCV 98.0 78.0 - 100.0 fL   MCH 31.3 26.0 - 34.0 pg   MCHC 31.9 30.0 - 36.0 g/dL   RDW 13.9 11.5 - 15.5 %   Platelets 371 150 - 400 K/uL  Comprehensive metabolic panel     Status: Abnormal   Collection Time: 01/15/15  6:59 AM  Result Value Ref Range   Sodium 137 135 - 145 mmol/L   Potassium 3.4 (L) 3.5 - 5.1 mmol/L    Comment: DELTA CHECK NOTED   Chloride 101 101 - 111 mmol/L   CO2 28 22 - 32 mmol/L   Glucose, Bld 152 (H) 65 - 99 mg/dL   BUN 10 6 - 20 mg/dL   Creatinine, Ser 0.88 0.44 - 1.00 mg/dL   Calcium 7.9 (L) 8.9 - 10.3 mg/dL   Total Protein 6.5 6.5 - 8.1 g/dL   Albumin 2.8 (L) 3.5 - 5.0 g/dL   AST 19 15 - 41 U/L   ALT 14 14 - 54 U/L   Alkaline Phosphatase 125 38 - 126 U/L   Total Bilirubin 0.5 0.3 - 1.2 mg/dL   GFR calc non Af Amer 59 (L) >60 mL/min   GFR calc Af Amer >60 >60 mL/min    Comment: (NOTE) The eGFR has been calculated using the CKD EPI equation. This calculation has not been validated in all clinical situations. eGFR's persistently <60 mL/min signify possible Chronic Kidney Disease.    Anion gap 8 5 - 15  Comprehensive metabolic panel     Status: Abnormal   Collection Time: 01/16/15  6:46 AM  Result Value Ref Range   Sodium 141 135 - 145 mmol/L   Potassium 3.3 (L) 3.5 - 5.1 mmol/L   Chloride 104 101 - 111 mmol/L   CO2 30 22 - 32 mmol/L   Glucose, Bld 83 65 - 99 mg/dL   BUN 12 6 - 20 mg/dL   Creatinine, Ser 0.94 0.44 - 1.00 mg/dL   Calcium 7.4 (L) 8.9 - 10.3 mg/dL   Total Protein 6.3 (L) 6.5 - 8.1 g/dL   Albumin 2.6 (L) 3.5 -  5.0 g/dL   AST 17 15 - 41 U/L   ALT 12 (L) 14 - 54 U/L   Alkaline Phosphatase 107 38 - 126 U/L   Total Bilirubin 0.5 0.3 - 1.2 mg/dL   GFR calc non Af Amer 55 (L) >60 mL/min   GFR calc Af Amer >60 >60 mL/min    Comment: (NOTE) The eGFR has been calculated using the CKD EPI equation. This calculation has not been validated in all clinical situations. eGFR's persistently <60 mL/min signify  possible Chronic Kidney Disease.    Anion gap 7 5 - 15    ABGS No results for input(s): PHART, PO2ART, TCO2, HCO3 in the last 72 hours.  Invalid input(s): PCO2 CULTURES Recent Results (from the past 240 hour(s))  Culture, blood (routine x 2)     Status: None (Preliminary result)   Collection Time: 01/15/15  4:35 AM  Result Value Ref Range Status   Specimen Description RIGHT ANTECUBITAL  Final   Special Requests BOTTLES DRAWN AEROBIC ONLY [place]  Final   Culture NO GROWTH < 12 HOURS  Final   Report Status PENDING  Incomplete  Culture, blood (routine x 2)     Status: None (Preliminary result)   Collection Time: 01/15/15  4:41 AM  Result Value Ref Range Status   Specimen Description BLOOD RIGHT ARM  Final   Special Requests BOTTLES DRAWN AEROBIC ONLY [place]  Final   Culture NO GROWTH < 12 HOURS  Final   Report Status PENDING  Incomplete   Studies/Results: Ct Chest W Contrast  01/15/2015   CLINICAL DATA:  Subacute onset of cough for 2 weeks. Hemoptysis. Right upper quadrant abdominal pain and umbilical pain. Sore initial  EXAM: CT CHEST WITH CONTRAST  TECHNIQUE: Multidetector CT imaging of the chest was performed during intravenous contrast administration.  CONTRAST:  1103mL OMNIPAQUE IOHEXOL 300 MG/ML  SOLN  COMPARISON:  PET/CT performed 11/11/2014  FINDINGS: The patient's complex bilateral airspace disease and scarring is mostly similar in appearance, though worsened airspace opacification is noted at the posterior aspect of the left upper lobe and superior aspect of the left lower lobe. Multiple bilateral spiculated nodules are relatively stable in appearance, measuring up to 1.6 cm in size.  Cavitary lesions are again seen at the left upper and lower lobes, perhaps mildly more prominent at the left lower lobe. Numerous nodular densities within the left lower lobe are perhaps slightly worsened from the prior study. Underlying pleural parenchymal scarring is noted. Trace loculated fluid is noted  near the left lung apex. No pneumothorax is seen.  The findings remain compatible with tuberculosis, given the patient's history of tuberculosis. These appear to have worsened mildly on the left since the prior study. No definite superimposed infection is seen.  A moderate food-filled hiatal hernia is seen. Solid material is also noted within the mid to distal esophagus, which may reflect some degree of esophageal dysmotility.  The heart is borderline normal in size. Calcification is noted at the aortic and mitral valves, and scattered coronary artery calcifications are seen. No definite mediastinal or hilar lymphadenopathy is seen. No pericardial effusion is identified.  The thyroid gland is unremarkable in appearance. No axillary lymphadenopathy is seen.  The visualized portions of the liver are unremarkable. The spleen is absent. Postoperative change is noted about the visualized abdomen. Mild left renal scarring is seen. Relatively diffuse calcification is noted along the abdominal aorta and its branches.  No acute osseous abnormalities are identified. Mild degenerative change is noted  at the lower cervical spine.  IMPRESSION: 1. Complex bilateral airspace disease and scarring is mostly similar in appearance, though worsened airspace opacification is noted at the posterior aspect of the left upper lobe and superior aspect of the left lower lobe. Bilateral spiculated nodules are relatively stable. Cavitary lesions at the left upper and lower lobes are perhaps mildly more prominent, with slightly worsening numerous nodular densities at the left lower lobe. 2. Given mild associated FDG uptake on the prior PET/CT, this most likely reflects acute tuberculosis superimposed on chronic scarring. Scarring alone is unlikely to be associated with FDG uptake. Particularly, the numerous nodular densities at the left lower lobes are unlikely to reflect a typical superimposed pneumonia, and the relatively slow rate of change  would be very unusual for lymphangitic spread of tumor. 3. Moderate food-filled hiatal hernia seen. Solid material also noted in the mid to distal esophagus, which may reflect some degree of esophageal dysmotility. 4. Calcification at the aortic and mitral valves, and scattered coronary artery calcifications. 5. Relatively diffuse calcification along the abdominal aorta and its branches. These results were called by telephone at the time of interpretation on 01/15/2015 at 2:16 am to Dr. Thayer Jew, who verbally acknowledged these results.   Electronically Signed   By: Garald Balding M.D.   On: 01/15/2015 02:19   Dg Abd Acute W/chest  01/15/2015   CLINICAL DATA:  Acute onset of right lower quadrant abdominal pain. Hemoptysis. Initial encounter.  EXAM: DG ABDOMEN ACUTE W/ 1V CHEST  COMPARISON:  PET/CT performed 11/11/2014  FINDINGS: Scattered nodular opacities are again noted within both lungs, better characterized on prior PET/CT. Given prior cavitation and only minimal associated FDG uptake, these again likely reflect sequelae of the patient's known tuberculosis.  No definite superimposed focal airspace consolidation is seen. No pleural effusion or pneumothorax is identified. The cardiomediastinal silhouette remains normal in size.  Diffuse postoperative change is noted about the abdomen. The visualized bowel gas pattern is unremarkable. Scattered stool and air are seen within the colon; there is no evidence of small bowel dilatation to suggest obstruction. No free intra-abdominal air is identified on the provided upright view.  No acute osseous abnormalities are seen; the sacroiliac joints are unremarkable in appearance.  IMPRESSION: 1. Scattered nodular opacities again noted within both lungs, better characterized on prior PET/CT. Given prior cavitation and only minimal associated FDG uptake, these again likely reflect sequelae of the patient's known tuberculosis. 2. Unremarkable bowel gas pattern; no free  intra-abdominal air seen.   Electronically Signed   By: Garald Balding M.D.   On: 01/15/2015 00:45    Medications:  Prior to Admission:  Prescriptions prior to admission  Medication Sig Dispense Refill Last Dose  . acetaminophen (TYLENOL) 500 MG tablet Take 500 mg by mouth daily as needed for headache.    01/13/2015  . ATROVENT HFA 17 MCG/ACT inhaler 2 PUFFS FOUR TIMES DAILY. (Patient taking differently: TAKE TWO PUFFS VIA INHALATION TWICE DAILY AS NEEDED) 12.9 g 4 01/13/2015  . beta carotene w/minerals (OCUVITE) tablet Take 1 tablet by mouth daily.   01/14/2015  . calcium carbonate (OS-CAL) 600 MG TABS tablet Take by mouth daily.   01/14/2015  . citalopram (CELEXA) 10 MG tablet TAKE (1) TABLET BY MOUTH ONCE DAILY. 30 tablet 5 01/14/2015  . diazepam (VALIUM) 5 MG tablet TAKE 1/2 TABLET EVERY 6 HOURS AS NEEDED AND 1 TABLET AT BEDTIME. 60 tablet 2 01/14/2015  . levothyroxine (SYNTHROID, LEVOTHROID) 25 MCG tablet TAKE 2 TABLETS  ON MONDAY AND FRIDAY AND ONE TABLET ALL OTHER DAYS. 39 tablet 5 01/14/2015  . memantine (NAMENDA) 10 MG tablet TAKE 1 TABLET TWICE A DAY FOR DEMENTIA. 60 tablet 5 01/14/2015  . metoprolol tartrate (LOPRESSOR) 25 MG tablet Take one half tablet BID 30 tablet 5 01/14/2015 at 1015  . Multiple Vitamin (MULTIVITAMIN) tablet Take 1 tablet by mouth daily.   01/14/2015  . nystatin (MYCOSTATIN) 100000 UNIT/ML suspension Take 5 mLs by mouth 4 (four) times daily as needed (TRUSH).    01/14/2015  . pantoprazole (PROTONIX) 40 MG tablet TAKE ONE TABLET DAILY FOR ACID REFLUX. 30 tablet 5 01/14/2015  . potassium chloride (K-DUR) 10 MEQ tablet TAKE (1) TABLET BY MOUTH ONCE DAILY. 30 tablet 5 01/14/2015  . topiramate (TOPAMAX) 25 MG tablet Take 1 tablet (25 mg total) by mouth 2 (two) times daily. 30 tablet 6 01/14/2015  . venlafaxine XR (EFFEXOR-XR) 75 MG 24 hr capsule Take 1 capsule (75 mg total) by mouth daily with breakfast. 30 capsule 5 01/14/2015  . vitamin E 400 UNIT capsule Take 400 Units by mouth daily.   01/14/2015   . doxycycline (VIBRAMYCIN) 100 MG capsule Take 1 capsule (100 mg total) by mouth 2 (two) times daily. 20 capsule 0   . fluconazole (DIFLUCAN) 150 MG tablet Take 1 tablet (150 mg total) by mouth daily. 3 days apart (Patient not taking: Reported on 12/09/2014) 2 tablet 0 Not Taking  . fluticasone (FLONASE) 50 MCG/ACT nasal spray Place 1 spray into both nostrils daily as needed for allergies.    01/12/2015  . HYDROcodone-acetaminophen (NORCO/VICODIN) 5-325 MG per tablet TAKE (1) TABLET EVERY FOUR HOURS AS NEEDED. (Patient not taking: Reported on 10/18/2014) 35 tablet 0 Not Taking   Scheduled: . albuterol  2.5 mg Nebulization BID  . citalopram  10 mg Oral Daily  . ethambutol  800 mg Oral Daily  . feeding supplement (ENSURE ENLIVE)  237 mL Oral BID BM  . isoniazid  150 mg Oral Daily  . levothyroxine  25 mcg Oral QAC breakfast  . memantine  10 mg Oral BID  . metoprolol tartrate  12.5 mg Oral BID  . pantoprazole  40 mg Oral Daily  . piperacillin-tazobactam (ZOSYN)  IV  3.375 g Intravenous Q8H  . pyrazinamide  1,000 mg Oral Daily  . vitamin B-6  50 mg Oral Daily  . rifampin  300 mg Oral Daily  . topiramate  25 mg Oral BID  . vancomycin  500 mg Intravenous Q12H  . venlafaxine XR  75 mg Oral Q breakfast   Continuous: . 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 She says she was treated about 30 years ago and she took 9 months of isoniazid and rifampin. She has had a partial gastrectomy which of course makes her more susceptible. Principal Problem:   Tuberculosis Active Problems:   Ileostomy in place   Weight loss   Abnormal CT scan, chest   Hypothyroidism   Hemoptysis    Plan: Continue treatment for tuberculosis while we are awaiting TB culture and sputum    LOS: 1 day   Yulia Ulrich  L 01/16/2015, 10:37 AM

## 2015-01-16 NOTE — Progress Notes (Signed)
ANTIBIOTIC CONSULT NOTE - INITIAL  Pharmacy Consult for Vancomycin, Zosyn, Rifampin, Isoniazid, Pyrazinamide, Ethambutol  Indication: HCAP / active tuberculosis / COPD / Hemoptysis  Allergies  Allergen Reactions  . Tetanus Toxoid Hives and Swelling  . Zolpidem Tartrate     Not in right state of mind  . Fosamax [Alendronate Sodium] Other (See Comments)    Severe gastritis reflux.   Patient Measurements: Height: '4\' 11"'$  (149.9 cm) Weight: 79 lb (35.834 kg) IBW/kg (Calculated) : 43.2 Dosing weight 36Kg  Vital Signs: Temp: 98 F (36.7 C) (07/04 0700) Temp Source: Oral (07/04 0700) BP: 142/54 mmHg (07/04 0700) Pulse Rate: 84 (07/04 0700) Intake/Output from previous day: 07/03 0701 - 07/04 0700 In: 2516.3 [P.O.:440; I.V.:1826.3; IV Piggyback:250] Out: 200 [Urine:200] Intake/Output from this shift: Total I/O In: -  Out: 550 [Urine:350; Stool:200]  Labs:  Recent Labs  01/14/15 2255 01/15/15 0659 01/16/15 0646  WBC 13.7* 11.9*  --   HGB 12.3 12.3  --   PLT 348 371  --   CREATININE 1.13* 0.88 0.94   Estimated Creatinine Clearance: 25.6 mL/min (by C-G formula based on Cr of 0.94). No results for input(s): VANCOTROUGH, VANCOPEAK, VANCORANDOM, GENTTROUGH, GENTPEAK, GENTRANDOM, TOBRATROUGH, TOBRAPEAK, TOBRARND, AMIKACINPEAK, AMIKACINTROU, AMIKACIN in the last 72 hours.   Microbiology: Recent Results (from the past 720 hour(s))  Culture, blood (routine x 2)     Status: None (Preliminary result)   Collection Time: 01/15/15  4:35 AM  Result Value Ref Range Status   Specimen Description RIGHT ANTECUBITAL  Final   Special Requests BOTTLES DRAWN AEROBIC ONLY [place]  Final   Culture NO GROWTH < 12 HOURS  Final   Report Status PENDING  Incomplete  Culture, blood (routine x 2)     Status: None (Preliminary result)   Collection Time: 01/15/15  4:41 AM  Result Value Ref Range Status   Specimen Description BLOOD RIGHT ARM  Final   Special Requests BOTTLES DRAWN AEROBIC ONLY [place]   Final   Culture NO GROWTH < 12 HOURS  Final   Report Status PENDING  Incomplete   Medical History: Past Medical History  Diagnosis Date  . Lower abdominal pain   . Gastroesophageal reflux disease   . IBS (irritable bowel syndrome)   . Migraines   . GAD (generalized anxiety disorder)   . Anemia   . Osteoporosis   . Renal insufficiency   . Tachycardia   . Chronic abdominal pain   . Gastroparesis   . COPD (chronic obstructive pulmonary disease)   . Lung mass   . Chronic neck and back pain    VANCOMYCIN 7/3 >> ZOSYN 7/3 >> RIFAMPIN 7/3 >> ISONIAZIDE 7/3 >> PYRAZINAMIDE 7/3 >> ETHAMBUTOL 7/3 >>  Assessment: 81yo female who has lost about 30 pounds over the past few months due to poor appetite.  Pt c/o COPD and h/o TB.  Pt states she has been coughing up clots of blood.  Antibiotics started for HCAP and active TB.  SCr is at baseline.  Pt has very small body habitus.   Estimated Normalized clcr ~ 45-55m/min Discussed with Dr HLuan Pulling Dr HJerilee Hoh and JHeide Guile PharmD (ID svc) Anticipate will be changed to 2-3 times WEEKLY dosing upon discharge and f/u with Health Dept.   Goal of Therapy:  Vancomycin trough level 15-20 mcg/ml Eradicate infection.  Plan:   Vancomycin '500mg'$  IV q12hrs  Check trough tomorrow before am dose  Zosyn 3.375gm IV q8h, each dose over 4 hrs  Rifampin '300mg'$  po daily (~  $'10mg'X$ /Kg)  Isoniazid '150mg'$  po daily (~ '5mg'$ /Kg) - w/ Pyridoxine '50mg'$  daily  Ethambutol '800mg'$  po daily (guideline dosing)  Pyrazinamide '1000mg'$  po daily (guideline dosing)  Monitor labs, LFT's, progress, renal fxn, and c/s  [place] Crane, Yanitza Shvartsman A 01/16/2015,10:50 AM

## 2015-01-16 NOTE — Progress Notes (Signed)
[facility] PROGRESS NOTE  [name] PXT:[id number] DOB: 1928-09-06 DOA: 01/14/2015 PCP: [name], MD  Assessment/Plan: Hemoptysis -CT chest concerning for tuberculosis. -She was treated for tuberculosis 30 years ago with isoniazid and rifampin. -AFB cultures have been ordered. He was on precautions. -Has been started on treatment for tuberculosis with isoniazid, rifampin, ethambutol, pyrazinamide and pyridoxine. Will need to be treated for 9 months. -We'll need to remain in the hospital until 3 negative sputum's confirm that she is no longer active.  Hypothyroidism -Continue Synthroid  Code Status: Full code Family Communication: Patient only  Disposition Plan: To be determined   Consultants:  Pulmonary, Dr. Luan Pulling  Curbside consulted via phone with Dr. Johnnye Sima, infectious diseases   Antibiotics:  As above   Subjective: Feels well, no shortness of breath, hemoptysis has resolved  Objective: Filed Vitals:   01/15/15 1928 01/15/15 2320 01/16/15 0700 01/16/15 0811  BP:  138/49 142/54   Pulse:  80 84   Temp:  98 F (36.7 C) 98 F (36.7 C)   TempSrc:  Oral Oral   Resp:  16 18   Height:      Weight:      SpO2: 98% 97% 98% 98%    Intake/Output Summary (Last 24 hours) at 01/16/15 1157 Last data filed at 01/16/15 0954  Gross per 24 hour  Intake 2516.25 ml  Output    750 ml  Net 1766.25 ml   Filed Weights   01/14/15 2136 01/15/15 0430  Weight: 38.102 kg (84 lb) 35.834 kg (79 lb)    Exam:   General:  Alert, awake, oriented 3, no distress  Cardiovascular: Regular rate and rhythm, no murmurs, rubs or gallops  Respiratory: Clear to auscultation bilaterally  Abdomen: Soft, nontender, nondistended, positive bowel sounds  Extremities: No clubbing, cyanosis or edema, positive pulses   Neurologic:  Nonfocal  Data Reviewed: Basic Metabolic Panel:  Recent Labs Lab 01/12/15 1420 01/14/15 2255 01/15/15 0659 01/16/15 0646  NA 134* 137  137 141  K 4.5 4.3 3.4* 3.3*  CL 101 101 101 104  CO2 '27 28 28 30  '$ GLUCOSE 121* 105* 152* 83  BUN '13 12 10 12  '$ CREATININE 0.96 1.13* 0.88 0.94  CALCIUM 9.2 8.6* 7.9* 7.4*  PHOS 2.9  --   --   --    Liver Function Tests:  Recent Labs Lab 01/12/15 1420 01/14/15 2303 01/15/15 0659 01/16/15 0646  AST  --  '23 19 17  '$ ALT  --  15 14 12*  ALKPHOS  --  128* 125 107  BILITOT  --  0.4 0.5 0.5  PROT  --  7.0 6.5 6.3*  ALBUMIN 3.3* 3.1* 2.8* 2.6*    Recent Labs Lab 01/14/15 2303  LIPASE 13*   No results for input(s): AMMONIA in the last 168 hours. CBC:  Recent Labs Lab 01/14/15 2255 01/15/15 0659  WBC 13.7* 11.9*  NEUTROABS 7.9*  --   HGB 12.3 12.3  HCT 38.7 38.5  MCV 99.0 98.0  PLT 348 371   Cardiac Enzymes: No results for input(s): CKTOTAL, CKMB, CKMBINDEX, TROPONINI in the last 168 hours. BNP (last 3 results) No results for input(s): BNP in the last 8760 hours.  ProBNP (last 3 results) No results for input(s): PROBNP in the last 8760 hours.  CBG: No results for input(s): GLUCAP in the last 168 hours.  Recent Results (from the past 240 hour(s))  Culture, blood (routine x 2)     Status: None (Preliminary result)  Collection Time: 01/15/15  4:35 AM  Result Value Ref Range Status   Specimen Description RIGHT ANTECUBITAL  Final   Special Requests BOTTLES DRAWN AEROBIC ONLY [place]  Final   Culture NO GROWTH < 12 HOURS  Final   Report Status PENDING  Incomplete  Culture, blood (routine x 2)     Status: None (Preliminary result)   Collection Time: 01/15/15  4:41 AM  Result Value Ref Range Status   Specimen Description BLOOD RIGHT ARM  Final   Special Requests BOTTLES DRAWN AEROBIC ONLY [place]  Final   Culture NO GROWTH < 12 HOURS  Final   Report Status PENDING  Incomplete  Culture, respiratory (NON-Expectorated)     Status: None (Preliminary result)   Collection Time: 01/15/15  5:30 PM  Result Value Ref Range Status   Specimen Description SPUTUM  Final   Special  Requests NONE  Final   Gram Stain   Final    MODERATE WBC PRESENT, PREDOMINANTLY PMN FEW SQUAMOUS EPITHELIAL CELLS PRESENT NO ORGANISMS SEEN Performed at [facility]    Culture NO GROWTH Performed at [facility]   Final   Report Status PENDING  Incomplete     Studies: Ct Chest W Contrast  01/15/2015   CLINICAL DATA:  Subacute onset of cough for 2 weeks. Hemoptysis. Right upper quadrant abdominal pain and umbilical pain. Sore initial  EXAM: CT CHEST WITH CONTRAST  TECHNIQUE: Multidetector CT imaging of the chest was performed during intravenous contrast administration.  CONTRAST:  120m OMNIPAQUE IOHEXOL 300 MG/ML  SOLN  COMPARISON:  PET/CT performed 11/11/2014  FINDINGS: The patient's complex bilateral airspace disease and scarring is mostly similar in appearance, though worsened airspace opacification is noted at the posterior aspect of the left upper lobe and superior aspect of the left lower lobe. Multiple bilateral spiculated nodules are relatively stable in appearance, measuring up to 1.6 cm in size.  Cavitary lesions are again seen at the left upper and lower lobes, perhaps mildly more prominent at the left lower lobe. Numerous nodular densities within the left lower lobe are perhaps slightly worsened from the prior study. Underlying pleural parenchymal scarring is noted. Trace loculated fluid is noted near the left lung apex. No pneumothorax is seen.  The findings remain compatible with tuberculosis, given the patient's history of tuberculosis. These appear to have worsened mildly on the left since the prior study. No definite superimposed infection is seen.  A moderate food-filled hiatal hernia is seen. Solid material is also noted within the mid to distal esophagus, which may reflect some degree of esophageal dysmotility.  The heart is borderline normal in size. Calcification is noted at the aortic and mitral valves, and scattered coronary artery calcifications are seen. No  definite mediastinal or hilar lymphadenopathy is seen. No pericardial effusion is identified.  The thyroid gland is unremarkable in appearance. No axillary lymphadenopathy is seen.  The visualized portions of the liver are unremarkable. The spleen is absent. Postoperative change is noted about the visualized abdomen. Mild left renal scarring is seen. Relatively diffuse calcification is noted along the abdominal aorta and its branches.  No acute osseous abnormalities are identified. Mild degenerative change is noted at the lower cervical spine.  IMPRESSION: 1. Complex bilateral airspace disease and scarring is mostly similar in appearance, though worsened airspace opacification is noted at the posterior aspect of the left upper lobe and superior aspect of the left lower lobe. Bilateral spiculated nodules are relatively stable. Cavitary lesions at the left upper  and lower lobes are perhaps mildly more prominent, with slightly worsening numerous nodular densities at the left lower lobe. 2. Given mild associated FDG uptake on the prior PET/CT, this most likely reflects acute tuberculosis superimposed on chronic scarring. Scarring alone is unlikely to be associated with FDG uptake. Particularly, the numerous nodular densities at the left lower lobes are unlikely to reflect a typical superimposed pneumonia, and the relatively slow rate of change would be very unusual for lymphangitic spread of tumor. 3. Moderate food-filled hiatal hernia seen. Solid material also noted in the mid to distal esophagus, which may reflect some degree of esophageal dysmotility. 4. Calcification at the aortic and mitral valves, and scattered coronary artery calcifications. 5. Relatively diffuse calcification along the abdominal aorta and its branches. These results were called by telephone at the time of interpretation on 01/15/2015 at 2:16 am to Dr. Thayer Jew, who verbally acknowledged these results.   Electronically Signed   By: Garald Balding M.D.   On: 01/15/2015 02:19   Dg Abd Acute W/chest  01/15/2015   CLINICAL DATA:  Acute onset of right lower quadrant abdominal pain. Hemoptysis. Initial encounter.  EXAM: DG ABDOMEN ACUTE W/ 1V CHEST  COMPARISON:  PET/CT performed 11/11/2014  FINDINGS: Scattered nodular opacities are again noted within both lungs, better characterized on prior PET/CT. Given prior cavitation and only minimal associated FDG uptake, these again likely reflect sequelae of the patient's known tuberculosis.  No definite superimposed focal airspace consolidation is seen. No pleural effusion or pneumothorax is identified. The cardiomediastinal silhouette remains normal in size.  Diffuse postoperative change is noted about the abdomen. The visualized bowel gas pattern is unremarkable. Scattered stool and air are seen within the colon; there is no evidence of small bowel dilatation to suggest obstruction. No free intra-abdominal air is identified on the provided upright view.  No acute osseous abnormalities are seen; the sacroiliac joints are unremarkable in appearance.  IMPRESSION: 1. Scattered nodular opacities again noted within both lungs, better characterized on prior PET/CT. Given prior cavitation and only minimal associated FDG uptake, these again likely reflect sequelae of the patient's known tuberculosis. 2. Unremarkable bowel gas pattern; no free intra-abdominal air seen.   Electronically Signed   By: Garald Balding M.D.   On: 01/15/2015 00:45    Scheduled Meds: . albuterol  2.5 mg Nebulization BID  . citalopram  10 mg Oral Daily  . ethambutol  800 mg Oral Daily  . feeding supplement (RESOURCE BREEZE)  1 Container Oral BID BM  . isoniazid  150 mg Oral Daily  . levothyroxine  25 mcg Oral QAC breakfast  . memantine  10 mg Oral BID  . metoprolol tartrate  12.5 mg Oral BID  . pantoprazole  40 mg Oral Daily  . piperacillin-tazobactam (ZOSYN)  IV  3.375 g Intravenous Q8H  . pyrazinamide  1,000 mg Oral Daily  .  vitamin B-6  50 mg Oral Daily  . rifampin  300 mg Oral Daily  . topiramate  25 mg Oral BID  . vancomycin  500 mg Intravenous Q12H  . venlafaxine XR  75 mg Oral Q breakfast   Continuous Infusions: . sodium chloride 75 mL/hr at 01/16/15 0500    Principal Problem:   Tuberculosis Active Problems:   Ileostomy in place   Weight loss   Abnormal CT scan, chest   Hypothyroidism   Hemoptysis    Time spent: 25 minutes. Greater than 50% of this time was spent in direct contact with the patient  coordinating care.    Lelon Frohlich  [facility] Pager [phone]  If 7PM-7AM, please contact night-coverage at [email or web address], password [facility] 01/16/2015, 11:57 AM  LOS: 1 day

## 2015-01-17 LAB — QUANTIFERON IN TUBE
QFT TB AG MINUS NIL VALUE: 2.31 [IU]/mL
QUANTIFERON MITOGEN VALUE: 8.91 IU/mL
QUANTIFERON TB AG VALUE: 2.57 [IU]/mL
QUANTIFERON TB GOLD: POSITIVE — AB
Quantiferon Nil Value: 0.26 IU/mL

## 2015-01-17 LAB — VANCOMYCIN, TROUGH: Vancomycin Tr: 28 ug/mL (ref 10.0–20.0)

## 2015-01-17 LAB — CULTURE, RESPIRATORY: Culture: NORMAL

## 2015-01-17 LAB — QUANTIFERON TB GOLD ASSAY (BLOOD)

## 2015-01-17 LAB — CULTURE, RESPIRATORY W GRAM STAIN

## 2015-01-17 MED ORDER — LEVOFLOXACIN 500 MG PO TABS
500.0000 mg | ORAL_TABLET | Freq: Every day | ORAL | Status: DC
  Administered 2015-01-17 – 2015-01-18 (×2): 500 mg via ORAL
  Filled 2015-01-17 (×2): qty 1

## 2015-01-17 MED ORDER — VANCOMYCIN HCL 500 MG IV SOLR
500.0000 mg | INTRAVENOUS | Status: DC
  Filled 2015-01-17: qty 500

## 2015-01-17 NOTE — Progress Notes (Signed)
[id number] documented not given on this Pt. This was the wrong Pt. RT tried to edit and it wouldnt let me.

## 2015-01-17 NOTE — Progress Notes (Signed)
ANTIBIOTIC CONSULT NOTE  Pharmacy Consult for Vancomycin, Zosyn, Rifampin, Isoniazid, Pyrazinamide, Ethambutol  Indication: HCAP / active tuberculosis / COPD / Hemoptysis  Allergies  Allergen Reactions  . Tetanus Toxoid Hives and Swelling  . Zolpidem Tartrate     Not in right state of mind  . Fosamax [Alendronate Sodium] Other (See Comments)    Severe gastritis reflux.   Patient Measurements: Height: '4\' 11"'$  (149.9 cm) Weight: 79 lb (35.834 kg) IBW/kg (Calculated) : 43.2 Dosing weight 36Kg  Vital Signs: Temp: 98.8 F (37.1 C) (07/05 0600) Temp Source: Oral (07/05 0600) BP: 140/69 mmHg (07/05 0600) Pulse Rate: 65 (07/05 0600) Intake/Output from previous day: 07/04 0701 - 07/05 0700 In: 2156.3 [I.V.:1806.3; IV Piggyback:350] Out: 1050 [Urine:550; Stool:500] Intake/Output from this shift:    Labs:  Recent Labs  01/14/15 2255 01/15/15 0659 01/16/15 0646  WBC 13.7* 11.9*  --   HGB 12.3 12.3  --   PLT 348 371  --   CREATININE 1.13* 0.88 0.94   Estimated Creatinine Clearance: 25.6 mL/min (by C-G formula based on Cr of 0.94).  Recent Labs  01/17/15 0758  VANCOTROUGH 28*     Microbiology: Recent Results (from the past 720 hour(s))  Culture, blood (routine x 2)     Status: None (Preliminary result)   Collection Time: 01/15/15  4:35 AM  Result Value Ref Range Status   Specimen Description RIGHT ANTECUBITAL  Final   Special Requests BOTTLES DRAWN AEROBIC ONLY [place]  Final   Culture NO GROWTH < 12 HOURS  Final   Report Status PENDING  Incomplete  Culture, blood (routine x 2)     Status: None (Preliminary result)   Collection Time: 01/15/15  4:41 AM  Result Value Ref Range Status   Specimen Description BLOOD RIGHT ARM  Final   Special Requests BOTTLES DRAWN AEROBIC ONLY [place]  Final   Culture NO GROWTH < 12 HOURS  Final   Report Status PENDING  Incomplete  Culture, respiratory (NON-Expectorated)     Status: None   Collection Time: 01/15/15  5:30 PM  Result Value  Ref Range Status   Specimen Description SPUTUM  Final   Special Requests NONE  Final   Gram Stain   Final    MODERATE WBC PRESENT, PREDOMINANTLY PMN FEW SQUAMOUS EPITHELIAL CELLS PRESENT NO ORGANISMS SEEN Performed at [facility]    Culture   Final    NORMAL OROPHARYNGEAL FLORA Performed at [facility]    Report Status 01/17/2015 FINAL  Final  AFB culture with smear     Status: None (Preliminary result)   Collection Time: 01/15/15  5:30 PM  Result Value Ref Range Status   Specimen Description SPUTUM  Final   Special Requests NONE  Final   Acid Fast Smear   Final    NO ACID FAST BACILLI SEEN Performed at [facility]    Culture   Final    CULTURE WILL BE EXAMINED FOR 6 WEEKS BEFORE ISSUING A FINAL REPORT Performed at [facility]    Report Status PENDING  Incomplete   Medical History: Past Medical History  Diagnosis Date  . Lower abdominal pain   . Gastroesophageal reflux disease   . IBS (irritable bowel syndrome)   . Migraines   . GAD (generalized anxiety disorder)   . Anemia   . Osteoporosis   . Renal insufficiency   . Tachycardia   . Chronic abdominal pain   . Gastroparesis   . COPD (chronic obstructive pulmonary  disease)   . Lung mass   . Chronic neck and back pain    VANCOMYCIN 7/3 >> ZOSYN 7/3 >> RIFAMPIN 7/3 >> ISONIAZIDE 7/3 >> PYRAZINAMIDE 7/3 >> ETHAMBUTOL 7/3 >>  Assessment: 81yo female who has lost about 30 pounds over the past few months due to poor appetite.  Pt c/o COPD and h/o TB.  Pt states she has been coughing up clots of blood.  Antibiotics started for HCAP and active TB.  First AFB screen (-).  SCr is at baseline.  Pt has very small body habitus. Anticipate will be changed to 2-3 times WEEKLY dosing upon discharge and f/u with Health Dept.  Vancomycin trough above goal.  Goal of Therapy:  Vancomycin trough level 15-20 mcg/ml Eradicate infection.  Plan:   Reduce Vancomycin to '500mg'$  IV every 24  hours.  Continue Zosyn 3.375gm IV q8h, each dose over 4 hrs  Rifampin '300mg'$  po daily (~ '10mg'$ /Kg)  Isoniazid '150mg'$  po daily (~ '5mg'$ /Kg) - w/ Pyridoxine '50mg'$  daily  Ethambutol '800mg'$  po daily (guideline dosing)  Pyrazinamide '1000mg'$  po daily (guideline dosing)  Monitor labs, LFT's, progress, renal fxn, and c/s  Pricilla Larsson 01/17/2015,9:18 AM

## 2015-01-17 NOTE — Progress Notes (Signed)
[facility] PROGRESS NOTE  [name] AXK:[id number] DOB: [birth year] DOA: 01/14/2015 PCP: [name], MD  Interim Progress Note Patient is an 81 year old woman without much in the way of past medical history who presented to the hospital on 7/2 with complaints of hemoptysis. She did have a history positive for treated tuberculosis approximately 30 years ago, with strong clinical evidence for tuberculosis (symptoms as well as CT findings) and after discussion with Dr. Luan Pulling and with Dr. Johnnye Sima, we have decided to treat empirically for TB until culture data is negative. First AFB sputum is negative.  Assessment/Plan: Hemoptysis -CT chest concerning for active tuberculosis. -Treated for tuberculosis over 30 years ago with isoniazid and rifampin for 9 months. -Has had night sweats, fevers and hemoptysis. -Given strong clinical evidence for TB we have decided to start treatment with quadruple drug therapy. -First AFB sputum has however been negative. -Given high likelihood of TB, I believe we should treat for TB even if all AFB sputums are negative pending results of culture. -Dr. Luan Pulling input and recommendations appreciated. -Also discussed case with infectious diseases, Dr. Johnnye Sima, who agreed with plan of treatment for TB. -Will need to remain in the hospital under airborne isolation until 3 negative sputum's confirm that she is no longer actively infectious (I have discussed importance of collecting samples with respiratory therapy, it appears that samples have not been collected on 7/4 or 7/5). -We'll discontinue vancomycin and Zosyn today and transition to Levaquin to complete 7 days of antibiotics for other sources of pneumonia.  Hypothyroidism -Continue Synthroid.  Code Status: Full code Family Communication: Patient only  Disposition Plan: To remain under airborne isolation until 3 sputums are negative for AFB   Consultants:  Dr. Luan Pulling,  pulmonary   Antibiotics:  Levaquin day 1   Subjective: Frail elderly lady in bed, no specific complaints other than some mild stomach pain.  Objective: Filed Vitals:   01/16/15 1358 01/16/15 2114 01/17/15 0600 01/17/15 0739  BP: 133/61 178/78 140/69   Pulse: 88 85 65   Temp: 98.2 F (36.8 C) 99.6 F (37.6 C) 98.8 F (37.1 C)   TempSrc: Oral Oral Oral   Resp: '18 18 18   '$ Height:      Weight:      SpO2: 97% 98% 96% 94%    Intake/Output Summary (Last 24 hours) at 01/17/15 1306 Last data filed at 01/17/15 7867  Gross per 24 hour  Intake 2156.25 ml  Output    500 ml  Net 1656.25 ml   Filed Weights   01/14/15 2136 01/15/15 0430  Weight: 38.102 kg (84 lb) 35.834 kg (79 lb)    Exam:   General:  Alert, awake, oriented 3, pleasant and cooperative  Cardiovascular: Regular rate and rhythm  Respiratory: Clear to auscultation bilaterally  Abdomen: Soft, nontender, nondistended, positive bowel sounds  Extremities: No clubbing, cyanosis or edema, positive pulses   Neurologic:  Grossly intact and nonfocal  Data Reviewed: Basic Metabolic Panel:  Recent Labs Lab 01/12/15 1420 01/14/15 2255 01/15/15 0659 01/16/15 0646  NA 134* 137 137 141  K 4.5 4.3 3.4* 3.3*  CL 101 101 101 104  CO2 '27 28 28 30  '$ GLUCOSE 121* 105* 152* 83  BUN '13 12 10 12  '$ CREATININE 0.96 1.13* 0.88 0.94  CALCIUM 9.2 8.6* 7.9* 7.4*  PHOS 2.9  --   --   --    Liver Function Tests:  Recent Labs Lab 01/12/15 1420 01/14/15 2303 01/15/15 0659 01/16/15 5449  AST  --  '23 19 17  '$ ALT  --  15 14 12*  ALKPHOS  --  128* 125 107  BILITOT  --  0.4 0.5 0.5  PROT  --  7.0 6.5 6.3*  ALBUMIN 3.3* 3.1* 2.8* 2.6*    Recent Labs Lab 01/14/15 2303  LIPASE 13*   No results for input(s): AMMONIA in the last 168 hours. CBC:  Recent Labs Lab 01/14/15 2255 01/15/15 0659  WBC 13.7* 11.9*  NEUTROABS 7.9*  --   HGB 12.3 12.3  HCT 38.7 38.5  MCV 99.0 98.0  PLT 348 371   Cardiac Enzymes: No  results for input(s): CKTOTAL, CKMB, CKMBINDEX, TROPONINI in the last 168 hours. BNP (last 3 results) No results for input(s): BNP in the last 8760 hours.  ProBNP (last 3 results) No results for input(s): PROBNP in the last 8760 hours.  CBG: No results for input(s): GLUCAP in the last 168 hours.  Recent Results (from the past 240 hour(s))  Culture, blood (routine x 2)     Status: None (Preliminary result)   Collection Time: 01/15/15  4:35 AM  Result Value Ref Range Status   Specimen Description RIGHT ANTECUBITAL  Final   Special Requests BOTTLES DRAWN AEROBIC ONLY [place]  Final   Culture NO GROWTH < 12 HOURS  Final   Report Status PENDING  Incomplete  Culture, blood (routine x 2)     Status: None (Preliminary result)   Collection Time: 01/15/15  4:41 AM  Result Value Ref Range Status   Specimen Description BLOOD RIGHT ARM  Final   Special Requests BOTTLES DRAWN AEROBIC ONLY [place]  Final   Culture NO GROWTH < 12 HOURS  Final   Report Status PENDING  Incomplete  Culture, respiratory (NON-Expectorated)     Status: None   Collection Time: 01/15/15  5:30 PM  Result Value Ref Range Status   Specimen Description SPUTUM  Final   Special Requests NONE  Final   Gram Stain   Final    MODERATE WBC PRESENT, PREDOMINANTLY PMN FEW SQUAMOUS EPITHELIAL CELLS PRESENT NO ORGANISMS SEEN Performed at [facility]    Culture   Final    NORMAL OROPHARYNGEAL FLORA Performed at [facility]    Report Status 01/17/2015 FINAL  Final  AFB culture with smear     Status: None (Preliminary result)   Collection Time: 01/15/15  5:30 PM  Result Value Ref Range Status   Specimen Description SPUTUM  Final   Special Requests NONE  Final   Acid Fast Smear   Final    NO ACID FAST BACILLI SEEN Performed at [facility]    Culture   Final    CULTURE WILL BE EXAMINED FOR 6 WEEKS BEFORE ISSUING A FINAL REPORT Performed at [facility]    Report Status PENDING  Incomplete      Studies: No results found.  Scheduled Meds: . albuterol  2.5 mg Nebulization BID  . citalopram  10 mg Oral Daily  . ethambutol  800 mg Oral Daily  . feeding supplement (RESOURCE BREEZE)  1 Container Oral BID BM  . isoniazid  150 mg Oral Daily  . levothyroxine  25 mcg Oral QAC breakfast  . memantine  10 mg Oral BID  . metoprolol tartrate  12.5 mg Oral BID  . pantoprazole  40 mg Oral Daily  . piperacillin-tazobactam (ZOSYN)  IV  3.375 g Intravenous Q8H  . pyrazinamide  1,000 mg Oral Daily  . vitamin  B-6  50 mg Oral Daily  . rifampin  300 mg Oral Daily  . topiramate  25 mg Oral BID  . vancomycin  500 mg Intravenous Q24H  . venlafaxine XR  75 mg Oral Q breakfast   Continuous Infusions: . sodium chloride 1 mL (01/17/15 0620)    Principal Problem:   Tuberculosis Active Problems:   Ileostomy in place   Weight loss   Abnormal CT scan, chest   Hypothyroidism   Hemoptysis    Time spent: 30 minutes. Greater than 50% of this time was spent in direct contact with the patient coordinating care.    Lelon Frohlich  [facility] Pager [phone]  If 7PM-7AM, please contact night-coverage at [email or web address], password [facility] 01/17/2015, 1:06 PM  LOS: 2 days

## 2015-01-17 NOTE — Progress Notes (Signed)
Subjective: She says she doesn't feel as well as she would like. She has been having trouble with her IV site. Her first sputum is negative for AFB.  Objective: Vital signs in last 24 hours: Temp:  [98.2 F (36.8 C)-99.6 F (37.6 C)] 98.8 F (37.1 C) (07/05 0600) Pulse Rate:  [65-88] 65 (07/05 0600) Resp:  [18] 18 (07/05 0600) BP: (133-178)/(61-78) 140/69 mmHg (07/05 0600) SpO2:  [94 %-98 %] 94 % (07/05 0739) Weight change:  Last BM Date: 01/16/15  Intake/Output from previous day: 07/04 0701 - 07/05 0700 In: 2156.3 [I.V.:1806.3; IV Piggyback:350] Out: 1050 [Urine:550; Stool:500]  PHYSICAL EXAM General appearance: alert, cooperative and mild distress Resp: rhonchi bilaterally Cardio: regular rate and rhythm, S1, S2 normal, no murmur, click, rub or gallop GI: soft, non-tender; bowel sounds normal; no masses,  no organomegaly Extremities: extremities normal, atraumatic, no cyanosis or edema  Lab Results:  Results for orders placed or performed during the hospital encounter of 01/14/15 (from the past 48 hour(s))  Culture, respiratory (NON-Expectorated)     Status: None   Collection Time: 01/15/15  5:30 PM  Result Value Ref Range   Specimen Description SPUTUM    Special Requests NONE    Gram Stain      MODERATE WBC PRESENT, PREDOMINANTLY PMN FEW SQUAMOUS EPITHELIAL CELLS PRESENT NO ORGANISMS SEEN Performed at House Performed at [facility]    Report Status 01/17/2015 FINAL   AFB culture with smear     Status: None (Preliminary result)   Collection Time: 01/15/15  5:30 PM  Result Value Ref Range   Specimen Description SPUTUM    Special Requests NONE    Acid Fast Smear      NO ACID FAST BACILLI SEEN Performed at [facility]    Culture      CULTURE WILL BE EXAMINED FOR 6 WEEKS BEFORE ISSUING A FINAL REPORT Performed at [facility]    Report Status PENDING   Comprehensive metabolic  panel     Status: Abnormal   Collection Time: 01/16/15  6:46 AM  Result Value Ref Range   Sodium 141 135 - 145 mmol/L   Potassium 3.3 (L) 3.5 - 5.1 mmol/L   Chloride 104 101 - 111 mmol/L   CO2 30 22 - 32 mmol/L   Glucose, Bld 83 65 - 99 mg/dL   BUN 12 6 - 20 mg/dL   Creatinine, Ser 0.94 0.44 - 1.00 mg/dL   Calcium 7.4 (L) 8.9 - 10.3 mg/dL   Total Protein 6.3 (L) 6.5 - 8.1 g/dL   Albumin 2.6 (L) 3.5 - 5.0 g/dL   AST 17 15 - 41 U/L   ALT 12 (L) 14 - 54 U/L   Alkaline Phosphatase 107 38 - 126 U/L   Total Bilirubin 0.5 0.3 - 1.2 mg/dL   GFR calc non Af Amer 55 (L) >60 mL/min   GFR calc Af Amer >60 >60 mL/min    Comment: (NOTE) The eGFR has been calculated using the CKD EPI equation. This calculation has not been validated in all clinical situations. eGFR's persistently <60 mL/min signify possible Chronic Kidney Disease.    Anion gap 7 5 - 15  Vancomycin, trough     Status: Abnormal   Collection Time: 01/17/15  7:58 AM  Result Value Ref Range   Vancomycin Tr 28 (HH) 10.0 - 20.0 ug/mL    Comment: CRITICAL RESULT CALLED TO, READ  BACK BY AND VERIFIED WITH: WOODS,K AT 8:25AM ON 01/17/15 BY FESTERMAN,C     ABGS No results for input(s): PHART, PO2ART, TCO2, HCO3 in the last 72 hours.  Invalid input(s): PCO2 CULTURES Recent Results (from the past 240 hour(s))  Culture, blood (routine x 2)     Status: None (Preliminary result)   Collection Time: 01/15/15  4:35 AM  Result Value Ref Range Status   Specimen Description RIGHT ANTECUBITAL  Final   Special Requests BOTTLES DRAWN AEROBIC ONLY [place]  Final   Culture NO GROWTH < 12 HOURS  Final   Report Status PENDING  Incomplete  Culture, blood (routine x 2)     Status: None (Preliminary result)   Collection Time: 01/15/15  4:41 AM  Result Value Ref Range Status   Specimen Description BLOOD RIGHT ARM  Final   Special Requests BOTTLES DRAWN AEROBIC ONLY [place]  Final   Culture NO GROWTH < 12 HOURS  Final   Report Status PENDING  Incomplete   Culture, respiratory (NON-Expectorated)     Status: None   Collection Time: 01/15/15  5:30 PM  Result Value Ref Range Status   Specimen Description SPUTUM  Final   Special Requests NONE  Final   Gram Stain   Final    MODERATE WBC PRESENT, PREDOMINANTLY PMN FEW SQUAMOUS EPITHELIAL CELLS PRESENT NO ORGANISMS SEEN Performed at [facility]    Culture   Final    NORMAL OROPHARYNGEAL FLORA Performed at [facility]    Report Status 01/17/2015 FINAL  Final  AFB culture with smear     Status: None (Preliminary result)   Collection Time: 01/15/15  5:30 PM  Result Value Ref Range Status   Specimen Description SPUTUM  Final   Special Requests NONE  Final   Acid Fast Smear   Final    NO ACID FAST BACILLI SEEN Performed at [facility]    Culture   Final    CULTURE WILL BE EXAMINED FOR 6 WEEKS BEFORE ISSUING A FINAL REPORT Performed at [facility]    Report Status PENDING  Incomplete   Studies/Results: No results found.  Medications:  Prior to Admission:  Prescriptions prior to admission  Medication Sig Dispense Refill Last Dose  . acetaminophen (TYLENOL) 500 MG tablet Take 500 mg by mouth daily as needed for headache.    01/13/2015  . ATROVENT HFA 17 MCG/ACT inhaler 2 PUFFS FOUR TIMES DAILY. (Patient taking differently: TAKE TWO PUFFS VIA INHALATION TWICE DAILY AS NEEDED) 12.9 g 4 01/13/2015  . beta carotene w/minerals (OCUVITE) tablet Take 1 tablet by mouth daily.   01/14/2015  . calcium carbonate (OS-CAL) 600 MG TABS tablet Take by mouth daily.   01/14/2015  . citalopram (CELEXA) 10 MG tablet TAKE (1) TABLET BY MOUTH ONCE DAILY. 30 tablet 5 01/14/2015  . diazepam (VALIUM) 5 MG tablet TAKE 1/2 TABLET EVERY 6 HOURS AS NEEDED AND 1 TABLET AT BEDTIME. 60 tablet 2 01/14/2015  . levothyroxine (SYNTHROID, LEVOTHROID) 25 MCG tablet TAKE 2 TABLETS ON MONDAY AND FRIDAY AND ONE TABLET ALL OTHER DAYS. 39 tablet 5 01/14/2015  . memantine (NAMENDA) 10 MG tablet TAKE 1  TABLET TWICE A DAY FOR DEMENTIA. 60 tablet 5 01/14/2015  . metoprolol tartrate (LOPRESSOR) 25 MG tablet Take one half tablet BID 30 tablet 5 01/14/2015 at 1015  . Multiple Vitamin (MULTIVITAMIN) tablet Take 1 tablet by mouth daily.   01/14/2015  . nystatin (MYCOSTATIN) 100000 UNIT/ML suspension Take 5 mLs by mouth  4 (four) times daily as needed (TRUSH).    01/14/2015  . pantoprazole (PROTONIX) 40 MG tablet TAKE ONE TABLET DAILY FOR ACID REFLUX. 30 tablet 5 01/14/2015  . potassium chloride (K-DUR) 10 MEQ tablet TAKE (1) TABLET BY MOUTH ONCE DAILY. 30 tablet 5 01/14/2015  . topiramate (TOPAMAX) 25 MG tablet Take 1 tablet (25 mg total) by mouth 2 (two) times daily. 30 tablet 6 01/14/2015  . venlafaxine XR (EFFEXOR-XR) 75 MG 24 hr capsule Take 1 capsule (75 mg total) by mouth daily with breakfast. 30 capsule 5 01/14/2015  . vitamin E 400 UNIT capsule Take 400 Units by mouth daily.   01/14/2015  . doxycycline (VIBRAMYCIN) 100 MG capsule Take 1 capsule (100 mg total) by mouth 2 (two) times daily. 20 capsule 0   . fluconazole (DIFLUCAN) 150 MG tablet Take 1 tablet (150 mg total) by mouth daily. 3 days apart (Patient not taking: Reported on 12/09/2014) 2 tablet 0 Not Taking  . fluticasone (FLONASE) 50 MCG/ACT nasal spray Place 1 spray into both nostrils daily as needed for allergies.    01/12/2015  . HYDROcodone-acetaminophen (NORCO/VICODIN) 5-325 MG per tablet TAKE (1) TABLET EVERY FOUR HOURS AS NEEDED. (Patient not taking: Reported on 10/18/2014) 35 tablet 0 Not Taking   Scheduled: . albuterol  2.5 mg Nebulization BID  . citalopram  10 mg Oral Daily  . ethambutol  800 mg Oral Daily  . feeding supplement (RESOURCE BREEZE)  1 Container Oral BID BM  . isoniazid  150 mg Oral Daily  . levothyroxine  25 mcg Oral QAC breakfast  . memantine  10 mg Oral BID  . metoprolol tartrate  12.5 mg Oral BID  . pantoprazole  40 mg Oral Daily  . piperacillin-tazobactam (ZOSYN)  IV  3.375 g Intravenous Q8H  . pyrazinamide  1,000 mg Oral  Daily  . vitamin B-6  50 mg Oral Daily  . rifampin  300 mg Oral Daily  . topiramate  25 mg Oral BID  . vancomycin  500 mg Intravenous Q12H  . venlafaxine XR  75 mg Oral Q breakfast   Continuous: . 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 Her first AFB smear is negative however. She is being treated for pneumonia as well. Her clinical situation is consistent with a diagnosis of TB which she could also have Mycobacterium avium. We are awaiting AFB smears and culture. I think even if her smears are negative she should be treated until we have negative culture Principal Problem:   Tuberculosis Active Problems:   Ileostomy in place   Weight loss   Abnormal CT scan, chest   Hypothyroidism   Hemoptysis    Plan: As above    LOS: 2 days   Lizzett Nobile L 01/17/2015, 8:37 AM

## 2015-01-18 DIAGNOSIS — R042 Hemoptysis: Secondary | ICD-10-CM

## 2015-01-18 DIAGNOSIS — E038 Other specified hypothyroidism: Secondary | ICD-10-CM

## 2015-01-18 DIAGNOSIS — A150 Tuberculosis of lung: Secondary | ICD-10-CM

## 2015-01-18 DIAGNOSIS — R938 Abnormal findings on diagnostic imaging of other specified body structures: Secondary | ICD-10-CM

## 2015-01-18 LAB — CBC
HCT: 39.5 % (ref 36.0–46.0)
HEMOGLOBIN: 12.6 g/dL (ref 12.0–15.0)
MCH: 31.3 pg (ref 26.0–34.0)
MCHC: 31.9 g/dL (ref 30.0–36.0)
MCV: 98 fL (ref 78.0–100.0)
Platelets: 345 10*3/uL (ref 150–400)
RBC: 4.03 MIL/uL (ref 3.87–5.11)
RDW: 14.4 % (ref 11.5–15.5)
WBC: 11.8 10*3/uL — ABNORMAL HIGH (ref 4.0–10.5)

## 2015-01-18 LAB — BASIC METABOLIC PANEL
Anion gap: 5 (ref 5–15)
BUN: 9 mg/dL (ref 6–20)
CALCIUM: 7.7 mg/dL — AB (ref 8.9–10.3)
CO2: 25 mmol/L (ref 22–32)
Chloride: 112 mmol/L — ABNORMAL HIGH (ref 101–111)
Creatinine, Ser: 1.03 mg/dL — ABNORMAL HIGH (ref 0.44–1.00)
GFR calc Af Amer: 57 mL/min — ABNORMAL LOW (ref >60)
GFR calc non Af Amer: 49 mL/min — ABNORMAL LOW (ref >60)
GLUCOSE: 115 mg/dL — AB (ref 65–99)
Potassium: 3.5 mmol/L (ref 3.5–5.1)
SODIUM: 142 mmol/L (ref 135–145)

## 2015-01-18 LAB — VITAMIN B12: Vitamin B-12: 480 pg/mL (ref 180–914)

## 2015-01-18 MED ORDER — LEVOFLOXACIN 250 MG PO TABS
250.0000 mg | ORAL_TABLET | Freq: Every day | ORAL | Status: DC
  Administered 2015-01-19 – 2015-01-20 (×2): 250 mg via ORAL
  Filled 2015-01-18 (×2): qty 1

## 2015-01-18 NOTE — Care Management (Signed)
Important Message  Patient Details  Name: [name] MRN: [id number] Date of Birth: Oct 15, 1928   Medicare Important Message Given:  Yes-second notification given    Joylene Draft, RN 01/18/2015, 11:57 AM

## 2015-01-18 NOTE — Progress Notes (Signed)
Preliminary report concerning possible reactivation of Pulmonary TB called and faxed to [place], RN, CD Nurse at [phone] at 11:30 AM on 01/18/2015 by Ranell Patrick, RN, CIC, Infection Prevention Specialist at [phone]

## 2015-01-18 NOTE — Care Management Note (Signed)
Case Management Note  Patient Details  Name: [name] MRN: [id number] Date of Birth: 08-27-1928  Subjective/Objective:                  Pt admitted from home with possible TB. Pt lives with her husband and will return home at discharge. Pt has 24 hour sitters with [facility] services.  Action/Plan: Will continue to follow for discharge planning needs.  Expected Discharge Date:                  Expected Discharge Plan:  [place]  In-House Referral:  NA  Discharge planning Services  CM Consult  Post Acute Care Choice:  Resumption of Svcs/PTA Provider Choice offered to:     DME Arranged:    DME Agency:     HH Arranged:    [place] Agency:     Status of Service:  In process, will continue to follow  Medicare Important Message Given:    Date Medicare IM Given:    Medicare IM give by:    Date Additional Medicare IM Given:    Additional Medicare Important Message give by:     If discussed at [place] of Stay Meetings, dates discussed:    Additional Comments:  Joylene Draft, RN 01/18/2015, 11:21 AM

## 2015-01-18 NOTE — Progress Notes (Signed)
Subjective: She says she feels better. She has no new complaints. 1 AFB smear is negative. Her TB immunology is positive but that is not helpful in diagnosing active tuberculosis  Objective: Vital signs in last 24 hours: Temp:  [98.2 F (36.8 C)-98.8 F (37.1 C)] 98.2 F (36.8 C) (07/06 0629) Pulse Rate:  [66-75] 70 (07/06 0629) Resp:  [20] 20 (07/06 0629) BP: (137-169)/(64-75) 138/64 mmHg (07/06 0629) SpO2:  [96 %-99 %] 99 % (07/06 0717) Weight change:  Last BM Date: 01/16/15  Intake/Output from previous day: 07/05 0701 - 07/06 0700 In: 1586.3 [I.V.:1586.3] Out: -   PHYSICAL EXAM General appearance: alert, cooperative and no distress Resp: rhonchi bilaterally Cardio: regular rate and rhythm, S1, S2 normal, no murmur, click, rub or gallop GI: soft, non-tender; bowel sounds normal; no masses,  no organomegaly Extremities: extremities normal, atraumatic, no cyanosis or edema  Lab Results:  Results for orders placed or performed during the hospital encounter of 01/14/15 (from the past 48 hour(s))  Vancomycin, trough     Status: Abnormal   Collection Time: 01/17/15  7:58 AM  Result Value Ref Range   Vancomycin Tr 28 (HH) 10.0 - 20.0 ug/mL    Comment: CRITICAL RESULT CALLED TO, READ BACK BY AND VERIFIED WITH: WOODS,K AT 8:25AM ON 01/17/15 BY [facility]   Basic metabolic panel     Status: Abnormal   Collection Time: 01/18/15  7:34 AM  Result Value Ref Range   Sodium 142 135 - 145 mmol/L   Potassium 3.5 3.5 - 5.1 mmol/L   Chloride 112 (H) 101 - 111 mmol/L   CO2 25 22 - 32 mmol/L   Glucose, Bld 115 (H) 65 - 99 mg/dL   BUN 9 6 - 20 mg/dL   Creatinine, Ser 4.36 (H) 0.44 - 1.00 mg/dL   Calcium 7.7 (L) 8.9 - 10.3 mg/dL   GFR calc non Af Amer 49 (L) >60 mL/min   GFR calc Af Amer 57 (L) >60 mL/min    Comment: (NOTE) The eGFR has been calculated using the CKD EPI equation. This calculation has not been validated in all clinical situations. eGFR's persistently <60 mL/min  signify possible Chronic Kidney Disease.    Anion gap 5 5 - 15  CBC     Status: Abnormal   Collection Time: 01/18/15  7:34 AM  Result Value Ref Range   WBC 11.8 (H) 4.0 - 10.5 K/uL   RBC 4.03 3.87 - 5.11 MIL/uL   Hemoglobin 12.6 12.0 - 15.0 g/dL   HCT 67.2 86.2 - 60.0 %   MCV 98.0 78.0 - 100.0 fL   MCH 31.3 26.0 - 34.0 pg   MCHC 31.9 30.0 - 36.0 g/dL   RDW 48.7 94.9 - 60.5 %   Platelets 345 150 - 400 K/uL    ABGS No results for input(s): PHART, PO2ART, TCO2, HCO3 in the last 72 hours.  Invalid input(s): PCO2 CULTURES Recent Results (from the past 240 hour(s))  Culture, blood (routine x 2)     Status: None (Preliminary result)   Collection Time: 01/15/15  4:35 AM  Result Value Ref Range Status   Specimen Description RIGHT ANTECUBITAL  Final   Special Requests BOTTLES DRAWN AEROBIC ONLY [facility]  Final   Culture NO GROWTH < 12 HOURS  Final   Report Status PENDING  Incomplete  Culture, blood (routine x 2)     Status: None (Preliminary result)   Collection Time: 01/15/15  4:41 AM  Result Value Ref Range Status  Specimen Description BLOOD RIGHT ARM  Final   Special Requests BOTTLES DRAWN AEROBIC ONLY [place]  Final   Culture NO GROWTH < 12 HOURS  Final   Report Status PENDING  Incomplete  Culture, respiratory (NON-Expectorated)     Status: None   Collection Time: 01/15/15  5:30 PM  Result Value Ref Range Status   Specimen Description SPUTUM  Final   Special Requests NONE  Final   Gram Stain   Final    MODERATE WBC PRESENT, PREDOMINANTLY PMN FEW SQUAMOUS EPITHELIAL CELLS PRESENT NO ORGANISMS SEEN Performed at [facility]    Culture   Final    NORMAL OROPHARYNGEAL FLORA Performed at [facility]    Report Status 01/17/2015 FINAL  Final  AFB culture with smear     Status: None (Preliminary result)   Collection Time: 01/15/15  5:30 PM  Result Value Ref Range Status   Specimen Description SPUTUM  Final   Special Requests NONE  Final   Acid Fast Smear   Final     NO ACID FAST BACILLI SEEN Performed at [facility]    Culture   Final    CULTURE WILL BE EXAMINED FOR 6 WEEKS BEFORE ISSUING A FINAL REPORT Performed at [facility]    Report Status PENDING  Incomplete   Studies/Results: No results found.  Medications:  Prior to Admission:  Prescriptions prior to admission  Medication Sig Dispense Refill Last Dose  . acetaminophen (TYLENOL) 500 MG tablet Take 500 mg by mouth daily as needed for headache.    01/13/2015  . ATROVENT HFA 17 MCG/ACT inhaler 2 PUFFS FOUR TIMES DAILY. (Patient taking differently: TAKE TWO PUFFS VIA INHALATION TWICE DAILY AS NEEDED) 12.9 g 4 01/13/2015  . beta carotene w/minerals (OCUVITE) tablet Take 1 tablet by mouth daily.   01/14/2015  . calcium carbonate (OS-CAL) 600 MG TABS tablet Take by mouth daily.   01/14/2015  . citalopram (CELEXA) 10 MG tablet TAKE (1) TABLET BY MOUTH ONCE DAILY. 30 tablet 5 01/14/2015  . diazepam (VALIUM) 5 MG tablet TAKE 1/2 TABLET EVERY 6 HOURS AS NEEDED AND 1 TABLET AT BEDTIME. 60 tablet 2 01/14/2015  . levothyroxine (SYNTHROID, LEVOTHROID) 25 MCG tablet TAKE 2 TABLETS ON MONDAY AND FRIDAY AND ONE TABLET ALL OTHER DAYS. 39 tablet 5 01/14/2015  . memantine (NAMENDA) 10 MG tablet TAKE 1 TABLET TWICE A DAY FOR DEMENTIA. 60 tablet 5 01/14/2015  . metoprolol tartrate (LOPRESSOR) 25 MG tablet Take one half tablet BID 30 tablet 5 01/14/2015 at 1015  . Multiple Vitamin (MULTIVITAMIN) tablet Take 1 tablet by mouth daily.   01/14/2015  . nystatin (MYCOSTATIN) 100000 UNIT/ML suspension Take 5 mLs by mouth 4 (four) times daily as needed (TRUSH).    01/14/2015  . pantoprazole (PROTONIX) 40 MG tablet TAKE ONE TABLET DAILY FOR ACID REFLUX. 30 tablet 5 01/14/2015  . potassium chloride (K-DUR) 10 MEQ tablet TAKE (1) TABLET BY MOUTH ONCE DAILY. 30 tablet 5 01/14/2015  . topiramate (TOPAMAX) 25 MG tablet Take 1 tablet (25 mg total) by mouth 2 (two) times daily. 30 tablet 6 01/14/2015  . venlafaxine XR (EFFEXOR-XR) 75 MG  24 hr capsule Take 1 capsule (75 mg total) by mouth daily with breakfast. 30 capsule 5 01/14/2015  . vitamin E 400 UNIT capsule Take 400 Units by mouth daily.   01/14/2015  . doxycycline (VIBRAMYCIN) 100 MG capsule Take 1 capsule (100 mg total) by mouth 2 (two) times daily. 20 capsule 0   .  fluconazole (DIFLUCAN) 150 MG tablet Take 1 tablet (150 mg total) by mouth daily. 3 days apart (Patient not taking: Reported on 12/09/2014) 2 tablet 0 Not Taking  . fluticasone (FLONASE) 50 MCG/ACT nasal spray Place 1 spray into both nostrils daily as needed for allergies.    01/12/2015  . HYDROcodone-acetaminophen (NORCO/VICODIN) 5-325 MG per tablet TAKE (1) TABLET EVERY FOUR HOURS AS NEEDED. (Patient not taking: Reported on 10/18/2014) 35 tablet 0 Not Taking   Scheduled: . albuterol  2.5 mg Nebulization BID  . citalopram  10 mg Oral Daily  . ethambutol  800 mg Oral Daily  . feeding supplement (RESOURCE BREEZE)  1 Container Oral BID BM  . isoniazid  150 mg Oral Daily  . levofloxacin  500 mg Oral Daily  . levothyroxine  25 mcg Oral QAC breakfast  . memantine  10 mg Oral BID  . metoprolol tartrate  12.5 mg Oral BID  . pantoprazole  40 mg Oral Daily  . pyrazinamide  1,000 mg Oral Daily  . vitamin B-6  50 mg Oral Daily  . rifampin  300 mg Oral Daily  . topiramate  25 mg Oral BID  . venlafaxine XR  75 mg Oral Q breakfast   Continuous: . 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 She is being treated for bacterial pneumonia as well. She has one negative TB smear. I agree she should be treated until we know she is culture negative if that is indeed the case Principal Problem:   Tuberculosis Active Problems:   Ileostomy in place   Weight loss   Abnormal CT scan, chest   Hypothyroidism   Hemoptysis    Plan: Continue current  treatments    LOS: 3 days   Darnette Lampron L 01/18/2015, 8:46 AM

## 2015-01-18 NOTE — Progress Notes (Signed)
[facility] PROGRESS NOTE  [name] HWE:[id number] DOB: [birth year]    PCP:   [name], MD   HPI: [name] is an 81 y.o. female with hx of TB s/p full tx many years ago, hx of bleeding ulcer and partial gastrectomy, OA, CRI, admitted for hemoptysis, weight loss, and night sweats, with CT suspicious for TB, and being Tx with 4 drug regimen for TB.  First sputum was negative. Pulmonary is following her.    Rewiew of Systems:  Constitutional: Negative for malaise, fever and chills. No significant weight loss or weight gain Eyes: Negative for eye pain, redness and discharge, diplopia, visual changes, or flashes of light. ENMT: Negative for ear pain, hoarseness, nasal congestion, sinus pressure and sore throat. No headaches; tinnitus, drooling, or problem swallowing. Cardiovascular: Negative for chest pain, palpitations, diaphoresis, dyspnea and peripheral edema. ; No orthopnea, PND Respiratory: Negative for cough, hemoptysis, wheezing and stridor. No pleuritic chestpain. Gastrointestinal: Negative for nausea, vomiting, diarrhea, constipation, abdominal pain, melena, blood in stool, hematemesis, jaundice and rectal bleeding.    Genitourinary: Negative for frequency, dysuria, incontinence,flank pain and hematuria; Musculoskeletal: Negative for back pain and neck pain. Negative for swelling and trauma.;  Skin: . Negative for pruritus, rash, abrasions, bruising and skin lesion.; ulcerations Neuro: Negative for headache, lightheadedness and neck stiffness. Negative for weakness, altered level of consciousness , altered mental status, extremity weakness, burning feet, involuntary movement, seizure and syncope.  Psych: negative for anxiety, depression, insomnia, tearfulness, panic attacks, hallucinations, paranoia, suicidal or homicidal ideation    Past Medical History  Diagnosis Date  . Lower abdominal pain   . Gastroesophageal reflux disease   . IBS (irritable bowel syndrome)    . Migraines   . GAD (generalized anxiety disorder)   . Anemia   . Osteoporosis   . Renal insufficiency   . Tachycardia   . Chronic abdominal pain   . Gastroparesis   . COPD (chronic obstructive pulmonary disease)   . Lung mass   . Chronic neck and back pain     Past Surgical History  Procedure Laterality Date  . Stomach surgery for a bleeding ulcer when she was in her 54s    . Gastrectomy    . Tonsillectomy    . Appendectomy    . Cholecystectomy    . Abdominal hysterectomy    . Esophagogastroduodenoscopy      Medications:  HOME MEDS: Prior to Admission medications   Medication Sig Start Date End Date Taking? Authorizing Provider  acetaminophen (TYLENOL) 500 MG tablet Take 500 mg by mouth daily as needed for headache.    Yes Historical Provider, MD  ATROVENT HFA 17 MCG/ACT inhaler 2 PUFFS FOUR TIMES DAILY. Patient taking differently: TAKE TWO PUFFS VIA INHALATION TWICE DAILY AS NEEDED 08/23/14  Yes Kathyrn Drown, MD  beta carotene w/minerals (OCUVITE) tablet Take 1 tablet by mouth daily.   Yes Historical Provider, MD  calcium carbonate (OS-CAL) 600 MG TABS tablet Take by mouth daily.   Yes Historical Provider, MD  citalopram (CELEXA) 10 MG tablet TAKE (1) TABLET BY MOUTH ONCE DAILY. 12/07/14  Yes Kathyrn Drown, MD  diazepam (VALIUM) 5 MG tablet TAKE 1/2 TABLET EVERY 6 HOURS AS NEEDED AND 1 TABLET AT BEDTIME. 12/01/14  Yes Kathyrn Drown, MD  levothyroxine (SYNTHROID, LEVOTHROID) 25 MCG tablet TAKE 2 TABLETS ON MONDAY AND FRIDAY AND ONE TABLET ALL OTHER DAYS. 12/02/14  Yes Mikey Kirschner, MD  memantine (NAMENDA) 10 MG tablet TAKE 1  TABLET TWICE A DAY FOR DEMENTIA. 12/02/14  Yes Mikey Kirschner, MD  metoprolol tartrate (LOPRESSOR) 25 MG tablet Take one half tablet BID 09/13/14  Yes Kathyrn Drown, MD  Multiple Vitamin (MULTIVITAMIN) tablet Take 1 tablet by mouth daily.   Yes Historical Provider, MD  nystatin (MYCOSTATIN) 100000 UNIT/ML suspension Take 5 mLs by mouth 4 (four) times  daily as needed (TRUSH).  08/23/14  Yes Historical Provider, MD  pantoprazole (PROTONIX) 40 MG tablet TAKE ONE TABLET DAILY FOR ACID REFLUX. 12/02/14  Yes Mikey Kirschner, MD  potassium chloride (K-DUR) 10 MEQ tablet TAKE (1) TABLET BY MOUTH ONCE DAILY. 12/02/14  Yes Mikey Kirschner, MD  topiramate (TOPAMAX) 25 MG tablet Take 1 tablet (25 mg total) by mouth 2 (two) times daily. 12/09/14  Yes Kathyrn Drown, MD  venlafaxine XR (EFFEXOR-XR) 75 MG 24 hr capsule Take 1 capsule (75 mg total) by mouth daily with breakfast. 09/13/14  Yes Kathyrn Drown, MD  vitamin E 400 UNIT capsule Take 400 Units by mouth daily.   Yes Historical Provider, MD  doxycycline (VIBRAMYCIN) 100 MG capsule Take 1 capsule (100 mg total) by mouth 2 (two) times daily. 12/09/14   Kathyrn Drown, MD  fluconazole (DIFLUCAN) 150 MG tablet Take 1 tablet (150 mg total) by mouth daily. 3 days apart Patient not taking: Reported on 12/09/2014 12/05/14   Kathyrn Drown, MD  fluticasone (FLONASE) 50 MCG/ACT nasal spray Place 1 spray into both nostrils daily as needed for allergies.     Historical Provider, MD  HYDROcodone-acetaminophen (NORCO/VICODIN) 5-325 MG per tablet TAKE (1) TABLET EVERY FOUR HOURS AS NEEDED. Patient not taking: Reported on 10/18/2014 08/03/14   Kathyrn Drown, MD     Allergies:  Allergies  Allergen Reactions  . Tetanus Toxoid Hives and Swelling  . Zolpidem Tartrate     Not in right state of mind  . Fosamax [Alendronate Sodium] Other (See Comments)    Severe gastritis reflux.    Social History:   reports that she has never smoked. She has never used smokeless tobacco. She reports that she does not drink alcohol or use illicit drugs.  Family History: Family History  Problem Relation Age of Onset  . Stroke Mother   . Stroke Father   . Diabetes Sister   . Diabetes Brother   . Stroke Brother   . Lung cancer Sister   . Diabetes Sister   . Emphysema Sister   . Diabetes Brother   . Stroke Brother   . Diabetes Son    . Irritable bowel syndrome Son   . Diabetes Son   . Hypertension Son      Physical Exam: Filed Vitals:   01/17/15 2037 01/17/15 2121 01/18/15 0629 01/18/15 0717  BP:  169/75 138/64   Pulse:  75 70   Temp:  98.2 F (36.8 C) 98.2 F (36.8 C)   TempSrc:  Oral Oral   Resp:  20 20   Height:      Weight:      SpO2: 96% 97% 98% 99%   Blood pressure 138/64, pulse 70, temperature 98.2 F (36.8 C), temperature source Oral, resp. rate 20, height '4\' 11"'$  (1.499 m), weight 35.834 kg (79 lb), SpO2 99 %.  GEN:  Pleasant  patient lying in the stretcher in no acute distress; cooperative with exam. PSYCH:  alert and oriented x4; does not appear anxious or depressed; affect is appropriate. HEENT: Mucous membranes pink and anicteric; PERRLA; EOM intact;  no cervical lymphadenopathy nor thyromegaly or carotid bruit; no JVD; There were no stridor. Neck is very supple. Breasts:: Not examined CHEST WALL: No tenderness CHEST: Normal respiration, clear to auscultation bilaterally.  HEART: Regular rate and rhythm.  There are no murmur, rub, or gallops.   BACK: No kyphosis or scoliosis; no CVA tenderness ABDOMEN: soft and non-tender; no masses, no organomegaly, normal abdominal bowel sounds; no pannus; no intertriginous candida. There is no rebound and no distention. Rectal Exam: Not done EXTREMITIES: No bone or joint deformity; age-appropriate arthropathy of the hands and knees; no edema; no ulcerations.  There is no calf tenderness. Genitalia: not examined PULSES: 2+ and symmetric SKIN: Normal hydration no rash or ulceration CNS: Cranial nerves 2-12 grossly intact no focal lateralizing neurologic deficit.  Speech is fluent; uvula elevated with phonation, facial symmetry and tongue midline. DTR are normal bilaterally, cerebella exam is intact, barbinski is negative and strengths are equaled bilaterally.  No sensory loss.   Labs on Admission:  Basic Metabolic Panel:  Recent Labs Lab 01/12/15 1420  01/14/15 2255 01/15/15 0659 01/16/15 0646 01/18/15 0734  NA 134* 137 137 141 142  K 4.5 4.3 3.4* 3.3* 3.5  CL 101 101 101 104 112*  CO2 '27 28 28 30 25  '$ GLUCOSE 121* 105* 152* 83 115*  BUN '13 12 10 12 9  '$ CREATININE 0.96 1.13* 0.88 0.94 1.03*  CALCIUM 9.2 8.6* 7.9* 7.4* 7.7*  PHOS 2.9  --   --   --   --    Liver Function Tests:  Recent Labs Lab 01/12/15 1420 01/14/15 2303 01/15/15 0659 01/16/15 0646  AST  --  '23 19 17  '$ ALT  --  15 14 12*  ALKPHOS  --  128* 125 107  BILITOT  --  0.4 0.5 0.5  PROT  --  7.0 6.5 6.3*  ALBUMIN 3.3* 3.1* 2.8* 2.6*    Recent Labs Lab 01/14/15 2303  LIPASE 13*   No results for input(s): AMMONIA in the last 168 hours. CBC:  Recent Labs Lab 01/14/15 2255 01/15/15 0659 01/18/15 0734  WBC 13.7* 11.9* 11.8*  NEUTROABS 7.9*  --   --   HGB 12.3 12.3 12.6  HCT 38.7 38.5 39.5  MCV 99.0 98.0 98.0  PLT 348 371 345    Assessment/Plan Present on Admission:  . Hemoptysis . Hypothyroidism . Abnormal CT scan, chest . Weight loss  PLAN:    TB:  Continue with antituberculous regimen.  Will check B12, Folate, and will need at least weekly LFTs.  I suspect we will need at least 3 consecutive negative sputum prior to thinking about discontinuation of airborn precaution.  Will continue with her thyroid supplement for her hypothyrodism.   Other plans as per orders.  Code Status: FULL Haskel Khan, MD. [facility] Pager [phone] 7pm to 7am.  01/18/2015, 11:18 AM

## 2015-01-19 DIAGNOSIS — E039 Hypothyroidism, unspecified: Secondary | ICD-10-CM

## 2015-01-19 LAB — FOLATE RBC
Folate, RBC: >1494 ng/mL (ref >498)
HEMATOCRIT: 41.5 % (ref 34.0–46.6)

## 2015-01-19 NOTE — Progress Notes (Signed)
She has now obtained to sputum for AFB. The first is negative. As mentioned previously I think she will need to be treated regardless of whether her sputum show acid-fast bacilli until her cultures come back. If she has active TB she should be noninfectious after she's been on medications for a week

## 2015-01-19 NOTE — Progress Notes (Addendum)
[facility] PROGRESS NOTE  [name]:[id number] DOB: [birth year]    PCP:   [name] Lange, MD   HPI: [name] is an 81 y.o. female with hx of TB s/p full tx many years ago, hx of bleeding ulcer and partial gastrectomy, OA, CRI, admitted for hemoptysis, weight loss, and night sweats, with CT suspicious for TB, and being Tx with 4 drug regimen for TB. First sputum was negative by smear.  Pulmonary is following her.[name] felt that isolation can be lifted after 1 week of antiTB treatment, which will be January 22, 2015 (She was started on July 3rd)   Rewiew of Systems:  Constitutional: Negative for malaise, fever and chills. No significant weight loss or weight gain Eyes: Negative for eye pain, redness and discharge, diplopia, visual changes, or flashes of light. ENMT: Negative for ear pain, hoarseness, nasal congestion, sinus pressure and sore throat. No headaches; tinnitus, drooling, or problem swallowing. Cardiovascular: Negative for chest pain, palpitations, diaphoresis, dyspnea and peripheral edema. ; No orthopnea, PND Respiratory: Negative for cough, hemoptysis, wheezing and stridor. No pleuritic chestpain. Gastrointestinal: Negative for nausea, vomiting, diarrhea, constipation, abdominal pain, melena, blood in stool, hematemesis, jaundice and rectal bleeding.    Genitourinary: Negative for frequency, dysuria, incontinence,flank pain and hematuria; Musculoskeletal: Negative for back pain and neck pain. Negative for swelling and trauma.;  Skin: . Negative for pruritus, rash, abrasions, bruising and skin lesion.; ulcerations Neuro: Negative for headache, lightheadedness and neck stiffness. Negative for weakness, altered level of consciousness , altered mental status, extremity weakness, burning feet, involuntary movement, seizure and syncope.  Psych: negative for anxiety, depression, insomnia, tearfulness, panic attacks, hallucinations, paranoia, suicidal or homicidal  ideation    Past Medical History  Diagnosis Date  . Lower abdominal pain   . Gastroesophageal reflux disease   . IBS (irritable bowel syndrome)   . Migraines   . GAD (generalized anxiety disorder)   . Anemia   . Osteoporosis   . Renal insufficiency   . Tachycardia   . Chronic abdominal pain   . Gastroparesis   . COPD (chronic obstructive pulmonary disease)   . Lung mass   . Chronic neck and back pain     Past Surgical History  Procedure Laterality Date  . Stomach surgery for a bleeding ulcer when she was in her 48s    . Gastrectomy    . Tonsillectomy    . Appendectomy    . Cholecystectomy    . Abdominal hysterectomy    . Esophagogastroduodenoscopy      Medications:  HOME MEDS: Prior to Admission medications   Medication Sig Start Date End Date Taking? Authorizing Provider  acetaminophen (TYLENOL) 500 MG tablet Take 500 mg by mouth daily as needed for headache.    Yes Historical Provider, MD  ATROVENT HFA 17 MCG/ACT inhaler 2 PUFFS FOUR TIMES DAILY. Patient taking differently: TAKE TWO PUFFS VIA INHALATION TWICE DAILY AS NEEDED 08/23/14  Yes Kathyrn Drown, MD  beta carotene w/minerals (OCUVITE) tablet Take 1 tablet by mouth daily.   Yes Historical Provider, MD  calcium carbonate (OS-CAL) 600 MG TABS tablet Take by mouth daily.   Yes Historical Provider, MD  citalopram (CELEXA) 10 MG tablet TAKE (1) TABLET BY MOUTH ONCE DAILY. 12/07/14  Yes Kathyrn Drown, MD  diazepam (VALIUM) 5 MG tablet TAKE 1/2 TABLET EVERY 6 HOURS AS NEEDED AND 1 TABLET AT BEDTIME. 12/01/14  Yes Kathyrn Drown, MD  levothyroxine (SYNTHROID, LEVOTHROID) 25 MCG tablet TAKE 2  TABLETS ON MONDAY AND FRIDAY AND ONE TABLET ALL OTHER DAYS. 12/02/14  Yes Mikey Kirschner, MD  memantine (NAMENDA) 10 MG tablet TAKE 1 TABLET TWICE A DAY FOR DEMENTIA. 12/02/14  Yes Mikey Kirschner, MD  metoprolol tartrate (LOPRESSOR) 25 MG tablet Take one half tablet BID 09/13/14  Yes Kathyrn Drown, MD  Multiple Vitamin (MULTIVITAMIN)  tablet Take 1 tablet by mouth daily.   Yes Historical Provider, MD  nystatin (MYCOSTATIN) 100000 UNIT/ML suspension Take 5 mLs by mouth 4 (four) times daily as needed (TRUSH).  08/23/14  Yes Historical Provider, MD  pantoprazole (PROTONIX) 40 MG tablet TAKE ONE TABLET DAILY FOR ACID REFLUX. 12/02/14  Yes Mikey Kirschner, MD  potassium chloride (K-DUR) 10 MEQ tablet TAKE (1) TABLET BY MOUTH ONCE DAILY. 12/02/14  Yes Mikey Kirschner, MD  topiramate (TOPAMAX) 25 MG tablet Take 1 tablet (25 mg total) by mouth 2 (two) times daily. 12/09/14  Yes Kathyrn Drown, MD  venlafaxine XR (EFFEXOR-XR) 75 MG 24 hr capsule Take 1 capsule (75 mg total) by mouth daily with breakfast. 09/13/14  Yes Kathyrn Drown, MD  vitamin E 400 UNIT capsule Take 400 Units by mouth daily.   Yes Historical Provider, MD  doxycycline (VIBRAMYCIN) 100 MG capsule Take 1 capsule (100 mg total) by mouth 2 (two) times daily. 12/09/14   Kathyrn Drown, MD  fluconazole (DIFLUCAN) 150 MG tablet Take 1 tablet (150 mg total) by mouth daily. 3 days apart Patient not taking: Reported on 12/09/2014 12/05/14   Kathyrn Drown, MD  fluticasone (FLONASE) 50 MCG/ACT nasal spray Place 1 spray into both nostrils daily as needed for allergies.     Historical Provider, MD  HYDROcodone-acetaminophen (NORCO/VICODIN) 5-325 MG per tablet TAKE (1) TABLET EVERY FOUR HOURS AS NEEDED. Patient not taking: Reported on 10/18/2014 08/03/14   Kathyrn Drown, MD     Allergies:  Allergies  Allergen Reactions  . Tetanus Toxoid Hives and Swelling  . Zolpidem Tartrate     Not in right state of mind  . Fosamax [Alendronate Sodium] Other (See Comments)    Severe gastritis reflux.    Social History:   reports that she has never smoked. She has never used smokeless tobacco. She reports that she does not drink alcohol or use illicit drugs.  Family History: Family History  Problem Relation Age of Onset  . Stroke Mother   . Stroke Father   . Diabetes Sister   . Diabetes  Brother   . Stroke Brother   . Lung cancer Sister   . Diabetes Sister   . Emphysema Sister   . Diabetes Brother   . Stroke Brother   . Diabetes Son   . Irritable bowel syndrome Son   . Diabetes Son   . Hypertension Son      Physical Exam: Filed Vitals:   01/18/15 2054 01/18/15 2100 01/19/15 0702 01/19/15 1420  BP:  145/53 166/72 169/57  Pulse:  79 84 72  Temp:  99.1 F (37.3 C) 98.9 F (37.2 C) 98.3 F (36.8 C)  TempSrc:  Oral Oral Oral  Resp:  '20 20 20  '$ Height:      Weight:      SpO2: 95% 96% 96% 100%   Blood pressure 169/57, pulse 72, temperature 98.3 F (36.8 C), temperature source Oral, resp. rate 20, height '4\' 11"'$  (1.499 m), weight 35.834 kg (79 lb), SpO2 100 %.  GEN:  Pleasant  patient lying in the stretcher in no  acute distress; cooperative with exam. PSYCH:  alert and oriented x4; does not appear anxious or depressed; affect is appropriate. HEENT: Mucous membranes pink and anicteric; PERRLA; EOM intact; no cervical lymphadenopathy nor thyromegaly or carotid bruit; no JVD; There were no stridor. Neck is very supple. Breasts:: Not examined CHEST WALL: No tenderness CHEST: Normal respiration, clear to auscultation bilaterally.  HEART: Regular rate and rhythm.  There are no murmur, rub, or gallops.   BACK: No kyphosis or scoliosis; no CVA tenderness ABDOMEN: soft and non-tender; no masses, no organomegaly, normal abdominal bowel sounds; no pannus; no intertriginous candida. There is no rebound and no distention. Rectal Exam: Not done EXTREMITIES: No bone or joint deformity; age-appropriate arthropathy of the hands and knees; no edema; no ulcerations.  There is no calf tenderness. Genitalia: not examined PULSES: 2+ and symmetric SKIN: Normal hydration no rash or ulceration CNS: Cranial nerves 2-12 grossly intact no focal lateralizing neurologic deficit.  Speech is fluent; uvula elevated with phonation, facial symmetry and tongue midline. DTR are normal bilaterally,  cerebella exam is intact, barbinski is negative and strengths are equaled bilaterally.  No sensory loss.   Labs on Admission:  Basic Metabolic Panel:  Recent Labs Lab 01/14/15 2255 01/15/15 0659 01/16/15 0646 01/18/15 0734  NA 137 137 141 142  K 4.3 3.4* 3.3* 3.5  CL 101 101 104 112*  CO2 '28 28 30 25  '$ GLUCOSE 105* 152* 83 115*  BUN '12 10 12 9  '$ CREATININE 1.13* 0.88 0.94 1.03*  CALCIUM 8.6* 7.9* 7.4* 7.7*   Liver Function Tests:  Recent Labs Lab 01/14/15 2303 01/15/15 0659 01/16/15 0646  AST '23 19 17  '$ ALT 15 14 12*  ALKPHOS 128* 125 107  BILITOT 0.4 0.5 0.5  PROT 7.0 6.5 6.3*  ALBUMIN 3.1* 2.8* 2.6*    Recent Labs Lab 01/14/15 2303  LIPASE 13*   No results for input(s): AMMONIA in the last 168 hours. CBC:  Recent Labs Lab 01/14/15 2255 01/15/15 0659 01/18/15 0734 01/18/15 1152  WBC 13.7* 11.9* 11.8*  --   NEUTROABS 7.9*  --   --   --   HGB 12.3 12.3 12.6  --   HCT 38.7 38.5 39.5 41.5  MCV 99.0 98.0 98.0  --   PLT 348 371 345  --     Assessment/Plan Present on Admission:  . Hemoptysis . Hypothyroidism . Abnormal CT scan, chest . Weight loss  PLAN: Continue with presumtive Tx for active TB.  Isolation can be lifted after one week of treatment ([name] Luan Pulling), which will put [place] to July 13, 16.  Continue Tx until cultures negative.  Check LFTs, B12 (hx of Bill[place]th surgery), folate, CBC and renal fx tests.  Continue with thyroid supplements.   Other plans as per orders.  Code Status: FULL Haskel Khan, MD. [facility] Pager [phone] 7pm to 7am.  01/19/2015, 5:26 PM

## 2015-01-20 DIAGNOSIS — Z932 Ileostomy status: Secondary | ICD-10-CM

## 2015-01-20 LAB — BASIC METABOLIC PANEL
Anion gap: 10 (ref 5–15)
BUN: 7 mg/dL (ref 6–20)
CHLORIDE: 105 mmol/L (ref 101–111)
CO2: 26 mmol/L (ref 22–32)
Calcium: 8.7 mg/dL — ABNORMAL LOW (ref 8.9–10.3)
Creatinine, Ser: 0.8 mg/dL (ref 0.44–1.00)
GLUCOSE: 119 mg/dL — AB (ref 65–99)
Potassium: 2.8 mmol/L — ABNORMAL LOW (ref 3.5–5.1)
SODIUM: 141 mmol/L (ref 135–145)

## 2015-01-20 LAB — CULTURE, BLOOD (ROUTINE X 2)
CULTURE: NO GROWTH
CULTURE: NO GROWTH

## 2015-01-20 MED ORDER — ISONIAZID 300 MG PO TABS: 150.0000 mg | ORAL_TABLET | Freq: Every day | ORAL | Status: DC

## 2015-01-20 MED ORDER — ETHAMBUTOL HCL 400 MG PO TABS: 800.0000 mg | ORAL_TABLET | Freq: Every day | ORAL | Status: DC

## 2015-01-20 MED ORDER — RIFAMPIN 300 MG PO CAPS: 300.0000 mg | ORAL_CAPSULE | Freq: Every day | ORAL | Status: DC

## 2015-01-20 MED ORDER — PYRIDOXINE HCL 50 MG PO TABS: 50.0000 mg | ORAL_TABLET | Freq: Every day | ORAL | Status: DC

## 2015-01-20 MED ORDER — PYRAZINAMIDE 500 MG PO TABS: 1000.0000 mg | ORAL_TABLET | Freq: Every day | ORAL | Status: DC

## 2015-01-20 MED ORDER — LEVOFLOXACIN 750 MG PO TABS: 750.0000 mg | ORAL_TABLET | Freq: Every day | ORAL | Status: DC

## 2015-01-20 MED ORDER — BOOST / RESOURCE BREEZE PO LIQD: 1.0000 | Freq: Two times a day (BID) | ORAL | Status: DC

## 2015-01-20 NOTE — Progress Notes (Signed)
Pt had a small bowel movement per rectum. Pt also had a bm in colostomy bag. MD notified and made aware.

## 2015-01-20 NOTE — Care Management Note (Signed)
Case Management Note  Patient Details  Name: [name] MRN: [id number] Date of Birth: [birth year]  Subjective/Objective:                    Action/Plan:   Expected Discharge Date:                  Expected Discharge Plan:  [place]  In-House Referral:  NA  Discharge planning Services  CM Consult  Post Acute Care Choice:  Resumption of Svcs/PTA Provider Choice offered to:     DME Arranged:    DME Agency:     HH Arranged:    [place] Agency:     Status of Service:  Completed, signed off  Medicare Important Message Given:  Yes-third notification given Date Medicare IM Given:    Medicare IM give by:    Date Additional Medicare IM Given:    Additional Medicare Important Message give by:     If discussed at [place] of Stay Meetings, dates discussed:    Additional Comments: Anticipate discharge over the weekend. [name] will continue at discharge. [name] notified of pts probable discharge. No further CM needs noted. Christinia Gully [place], RN 01/20/2015, 9:20 AM

## 2015-01-20 NOTE — Discharge Summary (Signed)
Physician Discharge Summary  [name] VVO:[id number] DOB: [birth year] DOA: 01/14/2015  PCP: [name], MD  Admit date: 01/14/2015 Discharge date: 01/20/2015  Time spent: 35 minutes  Recommendations for Outpatient Follow-up:  1. Follow up with Dr Standley Dakins in one week.   Discharge Diagnoses:  Principal Problem:   Tuberculosis Active Problems:   Ileostomy in place   Weight loss   Abnormal CT scan, chest   Hypothyroidism   Hemoptysis   Discharge Condition: Stable.   Diet recommendation: Regular.   Filed Weights   01/14/15 2136 01/15/15 0430  Weight: 38.102 kg (84 lb) 35.834 kg (79 lb)    History of present illness: Patient was admitted by Dr Darrick Meigs for hemoptysis on July 3rd, 2016.  As per his H and P: " 81 year old female with a history of gastroparesis, COPD, tuberculosis, treated 20 years ago came to the hospital with hemoptysis which started last night. Patient says that she coughed up several times and every time she coughed up clots of blood. Patient denies any shortness of breath or chest pain. Patient has had GI bleed in the past but this time she was coughing and was not vomiting. Denies nausea vomiting or diarrhea. Patient had a PET scan in April which showed a retrocardiac this segment of left lower lobe which was hypermetabolic. Patient has been followed by Dr. Luan Pulling as outpatient. Course in the ED CT chest shows complex bilateral airspace disease and scarring, concern for active tuberculosis. Patient says that she has lost about 30 pounds weight loss over past few months, has poor appetite and also has been having night sweats   Hospital Course: patient was admitted into the negative pressure airborn precaution room, and pulmonary was consulted.  Dr Standley Dakins saw her, and given her CT of the chest appearance, her hx of prior TB s/p treatment, her weight loss and night sweats, she was started on 4 drug regimen for active TB.  She was given INH, Rifampin, B6, Isoniazid, and  Ethambutol.  She was also tx for bacteria PNA and was given IV Lucianne Lei and Zosyn.  She remained stable, and her hemoptysis resolved.  She did not have SOB, fever, or chills.  She lives with her husband who reportedly not ill.  3 sputum specimen were obtained, and as of today, smear was negative, and cultures are still pending.  She was started on the 4 drug regimen on July 3rd.  Dr Luan Pulling had gotten in contact with the [place], and he called me to say that she can be discharged to home at this time, without being infectious, and that the health department will provide her treatment, that no prescription is needed. I had entered the refills for record keeping only.  She will follow up with Dr Standley Dakins in one week for follow up.  To complete her PNA treatment, she will finish another week of levaquin.  Thank you and good day.    Consultations:  Pulmonary:  Dr Luan Pulling.   Discharge Exam: Filed Vitals:   01/20/15 1055  BP: 179/98  Pulse: 118  Temp: 99.6 F (37.6 C)  Resp: 20    Discharge Instructions   Discharge Instructions    Diet - low sodium heart healthy    Complete by:  As directed      Increase activity slowly    Complete by:  As directed           Current Discharge Medication List    START taking these medications  Details  ethambutol (MYAMBUTOL) 400 MG tablet Take 2 tablets (800 mg total) by mouth daily. Qty: 60 tablet, Refills: 0    feeding supplement, RESOURCE BREEZE, (RESOURCE BREEZE) LIQD Take 1 Container by mouth 2 (two) times daily between meals. Qty: 30 Container, Refills: 0    isoniazid (NYDRAZID) 300 MG tablet Take 0.5 tablets (150 mg total) by mouth daily. Qty: 30 tablet, Refills: 0    levofloxacin (LEVAQUIN) 750 MG tablet Take 1 tablet (750 mg total) by mouth daily. Qty: 5 tablet, Refills: 0    pyrazinamide 500 MG tablet Take 2 tablets (1,000 mg total) by mouth daily. Qty: 60 tablet, Refills: 0    pyridOXINE (B-6) 50 MG tablet Take 1 tablet  (50 mg total) by mouth daily. Qty: 30 tablet, Refills: 1    rifampin (RIFADIN) 300 MG capsule Take 1 capsule (300 mg total) by mouth daily. Qty: 30 capsule, Refills: 0      CONTINUE these medications which have NOT CHANGED   Details  ATROVENT HFA 17 MCG/ACT inhaler 2 PUFFS FOUR TIMES DAILY. Qty: 12.9 g, Refills: 4    beta carotene w/minerals (OCUVITE) tablet Take 1 tablet by mouth daily.    calcium carbonate (OS-CAL) 600 MG TABS tablet Take by mouth daily.    citalopram (CELEXA) 10 MG tablet TAKE (1) TABLET BY MOUTH ONCE DAILY. Qty: 30 tablet, Refills: 5    diazepam (VALIUM) 5 MG tablet TAKE 1/2 TABLET EVERY 6 HOURS AS NEEDED AND 1 TABLET AT BEDTIME. Qty: 60 tablet, Refills: 2    levothyroxine (SYNTHROID, LEVOTHROID) 25 MCG tablet TAKE 2 TABLETS ON MONDAY AND FRIDAY AND ONE TABLET ALL OTHER DAYS. Qty: 39 tablet, Refills: 5    memantine (NAMENDA) 10 MG tablet TAKE 1 TABLET TWICE A DAY FOR DEMENTIA. Qty: 60 tablet, Refills: 5    metoprolol tartrate (LOPRESSOR) 25 MG tablet Take one half tablet BID Qty: 30 tablet, Refills: 5    Multiple Vitamin (MULTIVITAMIN) tablet Take 1 tablet by mouth daily.    pantoprazole (PROTONIX) 40 MG tablet TAKE ONE TABLET DAILY FOR ACID REFLUX. Qty: 30 tablet, Refills: 5    potassium chloride (K-DUR) 10 MEQ tablet TAKE (1) TABLET BY MOUTH ONCE DAILY. Qty: 30 tablet, Refills: 5    topiramate (TOPAMAX) 25 MG tablet Take 1 tablet (25 mg total) by mouth 2 (two) times daily. Qty: 30 tablet, Refills: 6    venlafaxine XR (EFFEXOR-XR) 75 MG 24 hr capsule Take 1 capsule (75 mg total) by mouth daily with breakfast. Qty: 30 capsule, Refills: 5    HYDROcodone-acetaminophen (NORCO/VICODIN) 5-325 MG per tablet TAKE (1) TABLET EVERY FOUR HOURS AS NEEDED. Qty: 35 tablet, Refills: 0      STOP taking these medications     acetaminophen (TYLENOL) 500 MG tablet      nystatin (MYCOSTATIN) 100000 UNIT/ML suspension      vitamin E 400 UNIT capsule       doxycycline (VIBRAMYCIN) 100 MG capsule      fluconazole (DIFLUCAN) 150 MG tablet      fluticasone (FLONASE) 50 MCG/ACT nasal spray        Allergies  Allergen Reactions  . Tetanus Toxoid Hives and Swelling  . Zolpidem Tartrate     Not in right state of mind  . Fosamax [Alendronate Sodium] Other (See Comments)    Severe gastritis reflux.      The results of significant diagnostics from this hospitalization (including imaging, microbiology, ancillary and laboratory) are listed below for reference.  Significant Diagnostic Studies: Ct Chest W Contrast  01/15/2015   CLINICAL DATA:  Subacute onset of cough for 2 weeks. Hemoptysis. Right upper quadrant abdominal pain and umbilical pain. Sore initial  EXAM: CT CHEST WITH CONTRAST  TECHNIQUE: Multidetector CT imaging of the chest was performed during intravenous contrast administration.  CONTRAST:  162m OMNIPAQUE IOHEXOL 300 MG/ML  SOLN  COMPARISON:  PET/CT performed 11/11/2014  FINDINGS: The patient's complex bilateral airspace disease and scarring is mostly similar in appearance, though worsened airspace opacification is noted at the posterior aspect of the left upper lobe and superior aspect of the left lower lobe. Multiple bilateral spiculated nodules are relatively stable in appearance, measuring up to 1.6 cm in size.  Cavitary lesions are again seen at the left upper and lower lobes, perhaps mildly more prominent at the left lower lobe. Numerous nodular densities within the left lower lobe are perhaps slightly worsened from the prior study. Underlying pleural parenchymal scarring is noted. Trace loculated fluid is noted near the left lung apex. No pneumothorax is seen.  The findings remain compatible with tuberculosis, given the patient's history of tuberculosis. These appear to have worsened mildly on the left since the prior study. No definite superimposed infection is seen.  A moderate food-filled hiatal hernia is seen. Solid material is  also noted within the mid to distal esophagus, which may reflect some degree of esophageal dysmotility.  The heart is borderline normal in size. Calcification is noted at the aortic and mitral valves, and scattered coronary artery calcifications are seen. No definite mediastinal or hilar lymphadenopathy is seen. No pericardial effusion is identified.  The thyroid gland is unremarkable in appearance. No axillary lymphadenopathy is seen.  The visualized portions of the liver are unremarkable. The spleen is absent. Postoperative change is noted about the visualized abdomen. Mild left renal scarring is seen. Relatively diffuse calcification is noted along the abdominal aorta and its branches.  No acute osseous abnormalities are identified. Mild degenerative change is noted at the lower cervical spine.  IMPRESSION: 1. Complex bilateral airspace disease and scarring is mostly similar in appearance, though worsened airspace opacification is noted at the posterior aspect of the left upper lobe and superior aspect of the left lower lobe. Bilateral spiculated nodules are relatively stable. Cavitary lesions at the left upper and lower lobes are perhaps mildly more prominent, with slightly worsening numerous nodular densities at the left lower lobe. 2. Given mild associated FDG uptake on the prior PET/CT, this most likely reflects acute tuberculosis superimposed on chronic scarring. Scarring alone is unlikely to be associated with FDG uptake. Particularly, the numerous nodular densities at the left lower lobes are unlikely to reflect a typical superimposed pneumonia, and the relatively slow rate of change would be very unusual for lymphangitic spread of tumor. 3. Moderate food-filled hiatal hernia seen. Solid material also noted in the mid to distal esophagus, which may reflect some degree of esophageal dysmotility. 4. Calcification at the aortic and mitral valves, and scattered coronary artery calcifications. 5. Relatively  diffuse calcification along the abdominal aorta and its branches. These results were called by telephone at the time of interpretation on 01/15/2015 at 2:16 am to Dr. CThayer Jew who verbally acknowledged these results.   Electronically Signed   By: JGarald BaldingM.D.   On: 01/15/2015 02:19   Dg Abd Acute W/chest  01/15/2015   CLINICAL DATA:  Acute onset of right lower quadrant abdominal pain. Hemoptysis. Initial encounter.  EXAM: DG ABDOMEN ACUTE W/ 1V CHEST  COMPARISON:  PET/CT performed 11/11/2014  FINDINGS: Scattered nodular opacities are again noted within both lungs, better characterized on prior PET/CT. Given prior cavitation and only minimal associated FDG uptake, these again likely reflect sequelae of the patient's known tuberculosis.  No definite superimposed focal airspace consolidation is seen. No pleural effusion or pneumothorax is identified. The cardiomediastinal silhouette remains normal in size.  Diffuse postoperative change is noted about the abdomen. The visualized bowel gas pattern is unremarkable. Scattered stool and air are seen within the colon; there is no evidence of small bowel dilatation to suggest obstruction. No free intra-abdominal air is identified on the provided upright view.  No acute osseous abnormalities are seen; the sacroiliac joints are unremarkable in appearance.  IMPRESSION: 1. Scattered nodular opacities again noted within both lungs, better characterized on prior PET/CT. Given prior cavitation and only minimal associated FDG uptake, these again likely reflect sequelae of the patient's known tuberculosis. 2. Unremarkable bowel gas pattern; no free intra-abdominal air seen.   Electronically Signed   By: Garald Balding M.D.   On: 01/15/2015 00:45    Microbiology: Recent Results (from the past 240 hour(s))  Culture, blood (routine x 2)     Status: None (Preliminary result)   Collection Time: 01/15/15  4:35 AM  Result Value Ref Range Status   Specimen Description  RIGHT ANTECUBITAL  Final   Special Requests BOTTLES DRAWN AEROBIC ONLY [place]  Final   Culture NO GROWTH 4 DAYS  Final   Report Status PENDING  Incomplete  Culture, blood (routine x 2)     Status: None (Preliminary result)   Collection Time: 01/15/15  4:41 AM  Result Value Ref Range Status   Specimen Description BLOOD RIGHT ARM  Final   Special Requests BOTTLES DRAWN AEROBIC ONLY [place]  Final   Culture NO GROWTH 4 DAYS  Final   Report Status PENDING  Incomplete  Culture, respiratory (NON-Expectorated)     Status: None   Collection Time: 01/15/15  5:30 PM  Result Value Ref Range Status   Specimen Description SPUTUM  Final   Special Requests NONE  Final   Gram Stain   Final    MODERATE WBC PRESENT, PREDOMINANTLY PMN FEW SQUAMOUS EPITHELIAL CELLS PRESENT NO ORGANISMS SEEN Performed at [facility]    Culture   Final    NORMAL OROPHARYNGEAL FLORA Performed at [facility]    Report Status 01/17/2015 FINAL  Final  AFB culture with smear     Status: None (Preliminary result)   Collection Time: 01/15/15  5:30 PM  Result Value Ref Range Status   Specimen Description SPUTUM  Final   Special Requests NONE  Final   Acid Fast Smear   Final    NO ACID FAST BACILLI SEEN Performed at [facility]    Culture   Final    CULTURE WILL BE EXAMINED FOR 6 WEEKS BEFORE ISSUING A FINAL REPORT Performed at [facility]    Report Status PENDING  Incomplete  AFB culture with smear     Status: None (Preliminary result)   Collection Time: 01/18/15  3:52 PM  Result Value Ref Range Status   Specimen Description LUNG  Final   Special Requests Normal  Final   Acid Fast Smear   Final    NO ACID FAST BACILLI SEEN Performed at [facility]    Culture   Final    CULTURE WILL BE EXAMINED FOR 6 WEEKS BEFORE ISSUING A FINAL REPORT Performed at  [facility]    Report Status PENDING  Incomplete     Labs: Basic Metabolic Panel:  Recent Labs Lab  01/14/15 2255 01/15/15 0659 01/16/15 0646 01/18/15 0734 01/20/15 0638  NA 137 137 141 142 141  K 4.3 3.4* 3.3* 3.5 2.8*  CL 101 101 104 112* 105  CO2 '28 28 30 25 26  '$ GLUCOSE 105* 152* 83 115* 119*  BUN '12 10 12 9 7  '$ CREATININE 1.13* 0.88 0.94 1.03* 0.80  CALCIUM 8.6* 7.9* 7.4* 7.7* 8.7*   Liver Function Tests:  Recent Labs Lab 01/14/15 2303 01/15/15 0659 01/16/15 0646  AST '23 19 17  '$ ALT 15 14 12*  ALKPHOS 128* 125 107  BILITOT 0.4 0.5 0.5  PROT 7.0 6.5 6.3*  ALBUMIN 3.1* 2.8* 2.6*    Recent Labs Lab 01/14/15 2303  LIPASE 13*   No results for input(s): AMMONIA in the last 168 hours. CBC:  Recent Labs Lab 01/14/15 2255 01/15/15 0659 01/18/15 0734 01/18/15 1152  WBC 13.7* 11.9* 11.8*  --   NEUTROABS 7.9*  --   --   --   HGB 12.3 12.3 12.6  --   HCT 38.7 38.5 39.5 41.5  MCV 99.0 98.0 98.0  --   PLT 348 371 345  --     Signed:  Oveta Idris  [facility] 01/20/2015, 12:23 PM

## 2015-01-20 NOTE — Progress Notes (Signed)
She now has obtained 3 sputum for AFB. I'm going to check with the health department and see if they feel that it safe for her to be discharged back with her husband. Discussed with Dr. Marin Comment

## 2015-01-20 NOTE — Progress Notes (Addendum)
Pt's IV catheter removed and intact. Pt's IV site clean dry and intact. Discharge instructions and medications reviewed and discussed with patient and her aide. All questions were answered and no further questions at this time. Pt in stable condition and in no acute distress at time of discharge. Pt escorted by nurse tech.

## 2015-01-20 NOTE — Care Management (Signed)
Important Message  Patient Details  Name: [name] MRN: [id number] Date of Birth: 1929-06-24   Medicare Important Message Given:  Yes-third notification given    Joylene Draft, RN 01/20/2015, 9:20 AM

## 2015-01-26 ENCOUNTER — Telehealth: Payer: Self-pay | Admitting: Family Medicine

## 2015-01-26 MED ORDER — HYDROCODONE-ACETAMINOPHEN 5-325 MG PO TABS: ORAL_TABLET | ORAL | Status: DC

## 2015-01-26 NOTE — Telephone Encounter (Signed)
Patient was notified that script is ready for pickup.  

## 2015-01-26 NOTE — Telephone Encounter (Signed)
Last filled 07/2014 for # [id number]

## 2015-01-26 NOTE — Telephone Encounter (Signed)
Ok times one 

## 2015-01-26 NOTE — Telephone Encounter (Signed)
HYDROcodone-acetaminophen (NORCO/VICODIN) 5-325 MG per tablet  Pt needs a refill on this med, states the one she had is expired

## 2015-01-27 ENCOUNTER — Other Ambulatory Visit: Payer: Self-pay | Admitting: Family Medicine

## 2015-01-27 MED ORDER — HYDROCODONE-ACETAMINOPHEN 5-325 MG PO TABS: ORAL_TABLET | ORAL | Status: DC

## 2015-02-14 ENCOUNTER — Ambulatory Visit (INDEPENDENT_AMBULATORY_CARE_PROVIDER_SITE_OTHER): Payer: Medicare Other | Admitting: Family Medicine

## 2015-02-14 ENCOUNTER — Encounter: Payer: Self-pay | Admitting: Family Medicine

## 2015-02-14 VITALS — Temp 98.7°F | Ht 59.0 in | Wt 80.0 lb

## 2015-02-14 DIAGNOSIS — J984 Other disorders of lung: Secondary | ICD-10-CM | POA: Diagnosis not present

## 2015-02-14 DIAGNOSIS — E038 Other specified hypothyroidism: Secondary | ICD-10-CM

## 2015-02-14 DIAGNOSIS — J189 Pneumonia, unspecified organism: Secondary | ICD-10-CM

## 2015-02-14 DIAGNOSIS — E876 Hypokalemia: Secondary | ICD-10-CM

## 2015-02-14 NOTE — Progress Notes (Signed)
   Subjective:    Patient ID: [name], female    DOB: 1929-01-24, 81 y.o.   MRN: [id number]  HPIpt arrives today for a hospital follow up. WEnt to hospital for hemoptysis.  Took med that nurse from health dept came out and gave med for tb. Pt states she has been itching every since she took that med. Had to stop taking med because of side effects.   Having a dry cough.  The patient states that she broke out in a rash with TB medicine she also states that she has not been able to keep down she is no longer taking the medicine. She does not know why the health department is coming to see her. She states that Dr. Luan Pulling her pulmonologist turn her over to the health department. Patient states she is not having night sweats denies fevers she has been dealing with some weight loss the patient has had numerous intestinal surgeries which have affected her as well I reviewed over all of the hospital records the testing as well as the CAT scan  Review of Systems As above patient relates cough some weight loss and eyes high fever chills relate no sweats    Objective:   Physical Exam Neck no masses lungs are clear with some rhonchi when she coughs heart regular abdomen soft extremities no edema skin warm dry       Assessment & Plan:  Patient very upset and angry on how she was treated at the hospital in addition to this she also feels that she does not have TB she does not one to see any other doctors does not one have any other tests completed. After long discussion patient does agree to see infectious disease specialist to get an opinion regarding if this really is TB or not. Also regarding what would be the recommended treatment if necessary  The patient will follow-up urine 2 months in addition to this patient was encouraged to keep her nutrition up and not lose anymore weight patient also to follow-up sooner problems greater than 25 minutes spent with patient on all of these issues Recent lab  work abnormal I do recommend checking some lab work relook at this

## 2015-02-15 LAB — TSH: TSH: 0.96 u[IU]/mL (ref 0.450–4.500)

## 2015-02-15 LAB — BASIC METABOLIC PANEL
BUN / CREAT RATIO: 15 (ref 11–26)
BUN: 16 mg/dL (ref 8–27)
CALCIUM: 9.7 mg/dL (ref 8.7–10.3)
CO2: 28 mmol/L (ref 18–29)
Chloride: 96 mmol/L — ABNORMAL LOW (ref 97–108)
Creatinine, Ser: 1.04 mg/dL — ABNORMAL HIGH (ref 0.57–1.00)
GFR calc Af Amer: 57 mL/min/{1.73_m2} — ABNORMAL LOW (ref >59)
GFR calc non Af Amer: 50 mL/min/{1.73_m2} — ABNORMAL LOW (ref >59)
GLUCOSE: 125 mg/dL — AB (ref 65–99)
POTASSIUM: 4.5 mmol/L (ref 3.5–5.2)
Sodium: 139 mmol/L (ref 134–144)

## 2015-02-15 LAB — PTH, INTACT AND CALCIUM: PTH: 34 pg/mL (ref 15–65)

## 2015-02-20 ENCOUNTER — Ambulatory Visit (INDEPENDENT_AMBULATORY_CARE_PROVIDER_SITE_OTHER): Payer: Medicare Other | Admitting: Internal Medicine

## 2015-02-20 ENCOUNTER — Encounter (INDEPENDENT_AMBULATORY_CARE_PROVIDER_SITE_OTHER): Payer: Self-pay | Admitting: Internal Medicine

## 2015-02-20 VITALS — BP 132/82 | HR 98 | Temp 97.7°F | Ht 59.0 in | Wt 83.0 lb

## 2015-02-20 DIAGNOSIS — K3184 Gastroparesis: Secondary | ICD-10-CM | POA: Diagnosis not present

## 2015-02-20 NOTE — Progress Notes (Signed)
Subjective:    Patient ID: [name], female    DOB: 07-Nov-1928, 81 y.o.   MRN: [id number]  HPI Here today for f/u of her gastroparesis. She tells me she is doing good.  She was at [facility] lasat months for coughing up blood. She was empirically treated for pneumonia.  There was a question of TB. She was started on Rifampin till the cultures came back.  She says she has a rash and Rifampin was stopped.  She also had night sweats.  She has hx of TB 30 yrs ago and was treated. She was started on Rifampin after she coughed up some blood. Appetite is okay. She has gained about 1 1/2 pounds since her last visit in April. She denies any cough now. She has not coughed up any blood.  She is eating 3 meals a day and 3 snack.  She is empties her bag 2-6 times a day. She denies seeking any blood. She denies any abdominal pain. In hospitals, blood cultures were negative Patient did not bring her medications in today, so I am really not sure if she is taking TB medications.  CBC    Component Value Date/Time   WBC 11.8* 01/18/2015 0734   RBC 4.03 01/18/2015 0734   HGB 12.6 01/18/2015 0734   HGB 14.6 11/11/2012 1417   HCT 41.5 01/18/2015 1152   HCT 39.5 01/18/2015 0734   PLT 345 01/18/2015 0734   MCV 98.0 01/18/2015 0734   MCH 31.3 01/18/2015 0734   MCHC 31.9 01/18/2015 0734   RDW 14.4 01/18/2015 0734   LYMPHSABS 4.2* 01/14/2015 2255   MONOABS 1.2* 01/14/2015 2255   EOSABS 0.4 01/14/2015 2255   BASOSABS 0.1 01/14/2015 2255   Hepatic Function Panel     Component Value Date/Time   PROT 6.3* 01/16/2015 0646   ALBUMIN 2.6* 01/16/2015 0646   AST 17 01/16/2015 0646   ALT 12* 01/16/2015 0646   ALKPHOS 107 01/16/2015 0646   BILITOT 0.5 01/16/2015 0646   BILIDIR 0.1 01/14/2015 2303   IBILI 0.3 01/14/2015 2303       Review of Systems Past Medical History  Diagnosis Date  . Lower abdominal pain   . Gastroesophageal reflux disease   . IBS (irritable bowel syndrome)   . Migraines   . GAD  (generalized anxiety disorder)   . Anemia   . Osteoporosis   . Renal insufficiency   . Tachycardia   . Chronic abdominal pain   . Gastroparesis   . COPD (chronic obstructive pulmonary disease)   . Lung mass   . Chronic neck and back pain     Past Surgical History  Procedure Laterality Date  . Stomach surgery for a bleeding ulcer when she was in her 73s    . Gastrectomy    . Tonsillectomy    . Appendectomy    . Cholecystectomy    . Abdominal hysterectomy    . Esophagogastroduodenoscopy      Allergies  Allergen Reactions  . Tetanus Toxoid Hives and Swelling  . Zolpidem Tartrate     Not in right state of mind  . Fosamax [Alendronate Sodium] Other (See Comments)    Severe gastritis reflux.    Current Outpatient Prescriptions on File Prior to Visit  Medication Sig Dispense Refill  . ATROVENT HFA 17 MCG/ACT inhaler 2 PUFFS FOUR TIMES DAILY. (Patient taking differently: TAKE TWO PUFFS VIA INHALATION TWICE DAILY AS NEEDED) 12.9 g 4  . beta carotene w/minerals (OCUVITE) tablet Take 1  tablet by mouth daily.    . calcium carbonate (OS-CAL) 600 MG TABS tablet Take by mouth daily.    . citalopram (CELEXA) 10 MG tablet TAKE (1) TABLET BY MOUTH ONCE DAILY. 30 tablet 5  . diazepam (VALIUM) 5 MG tablet TAKE 1/2 TABLET EVERY 6 HOURS AS NEEDED AND 1 TABLET AT BEDTIME. 60 tablet 2  . ethambutol (MYAMBUTOL) 400 MG tablet Take 2 tablets (800 mg total) by mouth daily. 60 tablet 0  . feeding supplement, RESOURCE BREEZE, (RESOURCE BREEZE) LIQD Take 1 Container by mouth 2 (two) times daily between meals. 30 Container 0  . HYDROcodone-acetaminophen (NORCO/VICODIN) 5-325 MG per tablet TAKE (1) TABLET EVERY FOUR HOURS AS NEEDED. 35 tablet 0  . levothyroxine (SYNTHROID, LEVOTHROID) 25 MCG tablet TAKE 2 TABLETS ON MONDAY AND FRIDAY AND ONE TABLET ALL OTHER DAYS. 39 tablet 5  . memantine (NAMENDA) 10 MG tablet TAKE 1 TABLET TWICE A DAY FOR DEMENTIA. 60 tablet 5  . metoprolol tartrate (LOPRESSOR) 25 MG  tablet Take one half tablet BID 30 tablet 5  . Multiple Vitamin (MULTIVITAMIN) tablet Take 1 tablet by mouth daily.    . pantoprazole (PROTONIX) 40 MG tablet TAKE ONE TABLET DAILY FOR ACID REFLUX. 30 tablet 5  . potassium chloride (K-DUR) 10 MEQ tablet TAKE (1) TABLET BY MOUTH ONCE DAILY. 30 tablet 5  . pyrazinamide 500 MG tablet Take 2 tablets (1,000 mg total) by mouth daily. 60 tablet 0  . pyridOXINE (B-6) 50 MG tablet Take 1 tablet (50 mg total) by mouth daily. 30 tablet 1  . rifampin (RIFADIN) 300 MG capsule Take 1 capsule (300 mg total) by mouth daily. 30 capsule 0  . topiramate (TOPAMAX) 25 MG tablet Take 1 tablet (25 mg total) by mouth 2 (two) times daily. 30 tablet 6  . venlafaxine XR (EFFEXOR-XR) 75 MG 24 hr capsule Take 1 capsule (75 mg total) by mouth daily with breakfast. 30 capsule 5  . isoniazid (NYDRAZID) 300 MG tablet Take 0.5 tablets (150 mg total) by mouth daily. (Patient not taking: Reported on 02/20/2015) 30 tablet 0  . levofloxacin (LEVAQUIN) 750 MG tablet Take 1 tablet (750 mg total) by mouth daily. (Patient not taking: Reported on 02/20/2015) 5 tablet 0   No current facility-administered medications on file prior to visit.        Objective:   Physical ExamBlood pressure 132/82, pulse 98, temperature 97.7 F (36.5 C), height '4\' 11"'$  (1.499 m), weight 83 lb (37.649 kg). Alert and oriented. Skin warm and dry. Oral mucosa is moist.   . Sclera anicteric, conjunctivae is pink. Thyroid not enlarged. No cervical lymphadenopathy. Bilateral wheezes.  Heart regular rate and rhythm.  Abdomen is soft. Bowel sounds are positive. No hepatomegaly. No abdominal masses felt. No tenderness.  No edema to lower extremities. Colostomy with light brown pasty stool.         Assessment & Plan:  Gastroparesis. She seems to be doing okay. She has gained 2 1/2 pounds since her last visit.  OV in 3 months.

## 2015-02-20 NOTE — Patient Instructions (Addendum)
OV in 3 months. Dontinue present medications

## 2015-02-27 ENCOUNTER — Other Ambulatory Visit: Payer: Self-pay | Admitting: Family Medicine

## 2015-02-27 LAB — AFB CULTURE WITH SMEAR (NOT AT ARMC): Acid Fast Smear: NONE SEEN

## 2015-02-27 NOTE — Telephone Encounter (Signed)
May refill this plus one additional 

## 2015-03-01 ENCOUNTER — Telehealth: Payer: Self-pay | Admitting: Family Medicine

## 2015-03-01 ENCOUNTER — Encounter (INDEPENDENT_AMBULATORY_CARE_PROVIDER_SITE_OTHER): Payer: Self-pay | Admitting: *Deleted

## 2015-03-01 NOTE — Telephone Encounter (Signed)
Gave verbal order to [facility] pharmacist

## 2015-03-01 NOTE — Telephone Encounter (Signed)
Patient needs refill diazepam (VALIUM) 5 MG tablet  [facility] says they did not get the Rx yesterday.

## 2015-03-02 LAB — ACID-FAST (MYCOBACTERIA) SMEAR AND CULTURE WITH REFLEX TO IDENTIFICATION: Acid Fast Smear: NONE SEEN

## 2015-03-03 ENCOUNTER — Telehealth (INDEPENDENT_AMBULATORY_CARE_PROVIDER_SITE_OTHER): Payer: Self-pay | Admitting: *Deleted

## 2015-03-03 NOTE — Telephone Encounter (Signed)
Patient states that she is having trouble with her colostomy bag. She gave the phone to her caregiver ,[name]. [name] states that she has had bad diarrhea for the last 3 days. This is the problem with her bag. Patient keeps a low grade fever. Patient desires an appointment with Dr.Rehman, it was explained that he was in procedures today and that he would be out of the office next week. She refuses to see Terri. Patient mentioned that she may call Dr. Gala Romney. I ask af first if I may call her ostomy nurse as she was telling me it was a bag problem. She refused. I ask if she could see her PCP, she declined that.  I told her that I would contact Dr.Rehman, and call her back.  Per Dr.Rehman may call in Augmentin Liquid 250/125 - Patient is to take by mouth three times daily for 10 days. She is to take Imodium 2 mg on a schedule  2-3 times a day. This was called to [employer].  Patient and caregiver was made aware.

## 2015-03-03 NOTE — Telephone Encounter (Signed)
[name] would like for [name] or Dr. Laural Golden to give her a call at [phone]. She is having problems with her Colostomy Bag.

## 2015-03-05 LAB — AFB CULTURE WITH SMEAR (NOT AT ARMC): ACID FAST SMEAR: NONE SEEN

## 2015-03-10 NOTE — Telephone Encounter (Signed)
[name] called and said Bettie's bag is still running like water. The antibiotics and Imodium is not doing any good. The return phone number is [phone].

## 2015-03-10 NOTE — Telephone Encounter (Signed)
Spoke with the caregiver, [name]. She states that the patient has been afebrile. Patient has taken the antibiotics and Imodium , they have not helped at all. There is watery diarrhea that continues to flow into bag per patient. She has not made her PCP aware. Call back number is [phone]. [name] and patient were advised that Dr.Rehman would be made aware and we would call back with his recommendations.

## 2015-03-13 ENCOUNTER — Other Ambulatory Visit: Payer: Self-pay | Admitting: Family Medicine

## 2015-03-13 ENCOUNTER — Telehealth (INDEPENDENT_AMBULATORY_CARE_PROVIDER_SITE_OTHER): Payer: Self-pay | Admitting: *Deleted

## 2015-03-13 DIAGNOSIS — R634 Abnormal weight loss: Secondary | ICD-10-CM

## 2015-03-13 DIAGNOSIS — R197 Diarrhea, unspecified: Secondary | ICD-10-CM

## 2015-03-13 NOTE — Telephone Encounter (Signed)
Need stool volume for 24 hours x 3. Need stool electrolytes and osmolality

## 2015-03-13 NOTE — Addendum Note (Signed)
Addended by: Grayland Ormond on: 03/13/2015 11:59 AM   Modules accepted: Orders

## 2015-03-15 ENCOUNTER — Telehealth: Payer: Self-pay | Admitting: Family Medicine

## 2015-03-15 ENCOUNTER — Other Ambulatory Visit (INDEPENDENT_AMBULATORY_CARE_PROVIDER_SITE_OTHER): Payer: Self-pay | Admitting: Internal Medicine

## 2015-03-15 ENCOUNTER — Encounter: Payer: Self-pay | Admitting: Infectious Disease

## 2015-03-15 ENCOUNTER — Ambulatory Visit (INDEPENDENT_AMBULATORY_CARE_PROVIDER_SITE_OTHER): Payer: Medicare Other | Admitting: Infectious Disease

## 2015-03-15 VITALS — BP 161/81 | HR 56 | Temp 98.2°F | Wt 83.5 lb

## 2015-03-15 DIAGNOSIS — Z23 Encounter for immunization: Secondary | ICD-10-CM

## 2015-03-15 DIAGNOSIS — J984 Other disorders of lung: Secondary | ICD-10-CM | POA: Diagnosis not present

## 2015-03-15 DIAGNOSIS — R634 Abnormal weight loss: Secondary | ICD-10-CM | POA: Diagnosis not present

## 2015-03-15 DIAGNOSIS — R9389 Abnormal findings on diagnostic imaging of other specified body structures: Secondary | ICD-10-CM

## 2015-03-15 DIAGNOSIS — A150 Tuberculosis of lung: Secondary | ICD-10-CM | POA: Diagnosis not present

## 2015-03-15 DIAGNOSIS — Z932 Ileostomy status: Secondary | ICD-10-CM | POA: Diagnosis not present

## 2015-03-15 DIAGNOSIS — R938 Abnormal findings on diagnostic imaging of other specified body structures: Secondary | ICD-10-CM | POA: Diagnosis not present

## 2015-03-15 DIAGNOSIS — R197 Diarrhea, unspecified: Secondary | ICD-10-CM | POA: Diagnosis not present

## 2015-03-15 DIAGNOSIS — R918 Other nonspecific abnormal finding of lung field: Secondary | ICD-10-CM | POA: Diagnosis not present

## 2015-03-15 DIAGNOSIS — R042 Hemoptysis: Secondary | ICD-10-CM

## 2015-03-15 HISTORY — DX: Tuberculosis of lung: A15.0

## 2015-03-15 HISTORY — DX: Other disorders of lung: J98.4

## 2015-03-15 NOTE — Progress Notes (Signed)
Subjective:    Patient ID: Tenna Child, female    DOB: 07-08-1929, 81 y.o.   MRN: [id number]   CONSULT: possible cavitary TB vs other atypical lung infection  Requesting physician: Dr. Luan Pulling PCP: Dr. 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  As can be seen by following CT images that were done at [facility] she has had multiple CT scans and lungs continue to show cavitary changes along throughout time. At various times or concerns for potential malignancy.  Recently on the day prior to admission the patient developed cough with some blood tinge to it. This was isolated incident and she was not having coughing prior to this at all. Her husband was worried that she could potentially "bleed to death" and she therefore sought workup at the hospital and came to [facility]. She was admitted and worked up for hemoptysis and a CT scan of the lungs was again obtained with a question of potential superinfection in her cavitary lungs with a typical agents versus an atypical pathogen such as tuberculosis. She had 3 sputum's were collected for AFB stain and culture and she was initiated on 4 drugs for TB therapy. These were brought her home to [facility]. She tells me that she did not tolerate the 4 drugs for TB and she developed a rash on her right arm which she then began applying an over-the-counter corticosteroid-induced. It is not clear to me how long she received therapy from the health department in any case it was stopped. All of her AFB cultures have been negative.  While her chart documents a history of weight loss or weight over the last year shows that she has not lost weight from the 85 pounds pounds that she weighed 1 year ago. She is absolutely without any fevers chills nausea  malaise or cough.  She does have concerns about her ostomy site but there is no evidence of infection here.   Review of Systems  Constitutional: Negative for fever, chills, diaphoresis, activity change, appetite change, fatigue and unexpected weight change.  HENT: Negative for congestion, rhinorrhea, sinus pressure, sneezing, sore throat and trouble swallowing.   Eyes: Negative for photophobia and visual disturbance.  Respiratory: Negative for cough, chest tightness, shortness of breath, wheezing and stridor.   Cardiovascular: Negative for chest pain, palpitations and leg swelling.  Gastrointestinal: Negative for nausea, vomiting, abdominal pain, diarrhea, constipation, blood in stool, abdominal distention and anal bleeding.  Genitourinary: Negative for dysuria, hematuria, flank pain and difficulty urinating.  Musculoskeletal: Negative for myalgias, back pain, joint swelling, arthralgias and gait problem.  Skin: Positive for wound. Negative for color change, pallor and rash.  Neurological: Negative for dizziness, tremors, weakness and light-headedness.  Hematological: Negative for adenopathy. Does not bruise/bleed easily.  Psychiatric/Behavioral: Negative for behavioral problems, confusion, sleep disturbance, dysphoric mood, decreased concentration and agitation.       Objective:   Physical Exam  Constitutional: She is oriented to person, place, and time. She appears well-developed and well-nourished. No distress.  HENT:  Head: Normocephalic and atraumatic.  Mouth/Throat: No oropharyngeal exudate.  Eyes: Conjunctivae and EOM are normal. No scleral icterus.  Neck: Normal range of motion. Neck supple.  Cardiovascular: Normal rate, regular rhythm, normal heart sounds and intact distal pulses.  Exam reveals no gallop and no friction rub.  No murmur heard. Pulmonary/Chest: Effort normal. No accessory muscle usage. No respiratory distress. She has decreased breath sounds in the right lower  field and the left lower field. She has no wheezes.  Abdominal: Soft. She exhibits no distension. There is no tenderness. There is no rebound.    Musculoskeletal: She exhibits no edema or tenderness.  Neurological: She is alert and oriented to person, place, and time. She exhibits normal muscle tone. Coordination normal.  Skin: Skin is warm and dry. No rash noted. She is not diaphoretic. No erythema. No pallor.  Psychiatric: She has a normal mood and affect. Her behavior is normal. Judgment and thought content normal.          Assessment & Plan:   Cavitary lung disease: Given that she only had ONE isolated episode of coughing blood tinged sputum and that she has had otherwise no cough, no weight loss, no fevers and that she has had 3 AFB sputa all negative for TB and that she was previously reportedly successfully treated for tuberculosis my suspicion for tuberculosis being active in this patient is essentially 0. I also do not think she has any other active opportunistic infection whatsoever. It is possible she could've had superinfection of her cavity by some other organism that she currently has no clinical evidence of infection whatsoever.  I think she can follow-up with her primary care physician and with her pulmonary doctor and return to [place] as needed.  I spent greater than 60 minutes with the patient including greater than 50% of time in face to face counsel of the patient and her caregiver re nature of TB, nature of her lung findings and my personal review and interpretation of these CT scans, micro data and in coordination of their care.

## 2015-03-15 NOTE — Telephone Encounter (Signed)
Patient called, spoke with [name] caregiver. Patient's Ostomy bag bursted again. Its just running yellow watery stuff per [name]. She was advised that the orders for the stool test had been sent to the [facility] lab, and to come by and get the bottles and instructions. Once the results were in , we would call her.  She states that the patient has lost weight,ect. I suggested that she see her primary doctor, or the patient go to the ED for evaluation. She states that the patient has an appointment with the infectious control doctor this afternoon.

## 2015-03-15 NOTE — Telephone Encounter (Signed)
Pt stated that

## 2015-03-15 NOTE — Telephone Encounter (Signed)
Per Dr.Rehman the patient needs to have stool volume 24 hours x 3. Stool electrolytes - code [phone] - unable to see /order in [place]. And stool osmolality.

## 2015-03-16 NOTE — Telephone Encounter (Signed)
Pts spouse stated that the pt went to see the lung specialist whom told her he wasn't sure  Why she was there an that she has no sign TB. So the spouse is wanting further answers from  You as to why she went to see this lung specialist, please. Advised you were out of the office an would Be back next week sometime to answer.

## 2015-03-17 NOTE — Telephone Encounter (Signed)
Call [name], [name] actually saw infect dis specialist, who dr Nicki Reaper thought should see because of conflicting recommendations from the hospital. Nicki Reaper had a substantial discusiioon with at least the [name] at time of referral and i agree 100% with [name] needing to see a i d specialist. Good news that i d specialist feels no evidence of TB, Dr Nicki Reaper will disc furthr with [name] at next regulaly sched visit , he will not be calling next wk since i reviewed it today

## 2015-03-17 NOTE — Telephone Encounter (Signed)
Discussed with patient and patient's husband. They were advised that pt actually saw infectious  disease specialist, who Dr Nicki Reaper thought should see because of conflicting recommendations from the hospital. Nicki Reaper had a substantial discusiioon with at least the pt at time of referral and i agree 100% with pt needing to see an infectious disease specialist. Good news that infectious disease specialist feels no evidence of TB, Dr Nicki Reaper will discuss further with pt at next regulaly scheduled visit. Patient verbalized understanding.

## 2015-03-18 ENCOUNTER — Other Ambulatory Visit (INDEPENDENT_AMBULATORY_CARE_PROVIDER_SITE_OTHER): Payer: Self-pay | Admitting: Internal Medicine

## 2015-03-18 DIAGNOSIS — R634 Abnormal weight loss: Secondary | ICD-10-CM | POA: Diagnosis not present

## 2015-03-18 DIAGNOSIS — R197 Diarrhea, unspecified: Secondary | ICD-10-CM | POA: Diagnosis not present

## 2015-03-20 ENCOUNTER — Other Ambulatory Visit (INDEPENDENT_AMBULATORY_CARE_PROVIDER_SITE_OTHER): Payer: Self-pay | Admitting: Internal Medicine

## 2015-03-20 LAB — OSMOLALITY, STOOL: Osmolality, Feces: 401 mosm

## 2015-03-21 ENCOUNTER — Other Ambulatory Visit (INDEPENDENT_AMBULATORY_CARE_PROVIDER_SITE_OTHER): Payer: Self-pay | Admitting: Internal Medicine

## 2015-03-21 ENCOUNTER — Other Ambulatory Visit (INDEPENDENT_AMBULATORY_CARE_PROVIDER_SITE_OTHER): Payer: Self-pay | Admitting: *Deleted

## 2015-03-21 DIAGNOSIS — R143 Flatulence: Secondary | ICD-10-CM | POA: Diagnosis not present

## 2015-03-21 DIAGNOSIS — R634 Abnormal weight loss: Secondary | ICD-10-CM | POA: Diagnosis not present

## 2015-03-21 DIAGNOSIS — D510 Vitamin B12 deficiency anemia due to intrinsic factor deficiency: Secondary | ICD-10-CM | POA: Diagnosis not present

## 2015-03-21 DIAGNOSIS — R531 Weakness: Secondary | ICD-10-CM | POA: Diagnosis not present

## 2015-03-21 DIAGNOSIS — K3184 Gastroparesis: Secondary | ICD-10-CM | POA: Diagnosis not present

## 2015-03-21 DIAGNOSIS — R197 Diarrhea, unspecified: Secondary | ICD-10-CM | POA: Diagnosis not present

## 2015-03-21 MED ORDER — AMOXICILLIN-POT CLAVULANATE 500-125 MG PO TABS: 1.0000 | ORAL_TABLET | Freq: Three times a day (TID) | ORAL | Status: DC

## 2015-03-21 NOTE — Telephone Encounter (Signed)
Per Dr.Rehman change the dose to 500/125 mg.

## 2015-03-23 LAB — ELECTROLYTES, QUANTITATIVE, FECES
CHOLORIDE, FECES: 68 meq/L
Choloride, Feces: 63 mEq/L
Potassium, Feces: 11 mEq/L
Potassium, Feces: 16 mEq/L
SODIUM, FECES: 160.1 meq/L
Sodium, Feces: 128 mEq/L

## 2015-03-24 LAB — OSMOLALITY, STOOL
OSMOLALITY, FECES: 381 mosm
Osmolality, Feces: 443 mOsm

## 2015-03-27 ENCOUNTER — Telehealth (INDEPENDENT_AMBULATORY_CARE_PROVIDER_SITE_OTHER): Payer: Self-pay | Admitting: *Deleted

## 2015-03-27 NOTE — Telephone Encounter (Signed)
Patient was wanting to know about her stool test. I explained that these were send out test and it would be just a little longer. She saw Dr.Van Dam ,infectious disease doctor. He told her she had no signs of infection and really did not know why she needed to be seen. She ask that Dr.Rehman look at that note. Patient also ask about an appointment with Dr.Rehman. I explained that he was cutting back in the office and that Butch Penny was working up these appointments. This will be reviewed with Dr.Rehman on 03/28/15.

## 2015-03-27 NOTE — Telephone Encounter (Signed)
Jassica needs to talk with [name]. Wanting test results but could not tell me what test she was referring to. Advised her the doctor will call her with all test results. Patient insist she speak with [name]. The return phone number is [phone].

## 2015-03-28 ENCOUNTER — Encounter: Payer: Self-pay | Admitting: Family Medicine

## 2015-03-28 ENCOUNTER — Ambulatory Visit (INDEPENDENT_AMBULATORY_CARE_PROVIDER_SITE_OTHER): Payer: Medicare Other | Admitting: Family Medicine

## 2015-03-28 VITALS — BP 132/84 | Ht 59.0 in | Wt 82.5 lb

## 2015-03-28 DIAGNOSIS — J984 Other disorders of lung: Secondary | ICD-10-CM

## 2015-03-28 DIAGNOSIS — R634 Abnormal weight loss: Secondary | ICD-10-CM

## 2015-03-28 DIAGNOSIS — J189 Pneumonia, unspecified organism: Secondary | ICD-10-CM

## 2015-03-28 NOTE — Progress Notes (Signed)
   Subjective:    Patient ID: [name], female    DOB: Apr 30, 1929, 81 y.o.   MRN: [id number]  HPI  Patient is in today for a follow up from last visit on 02/14/15 for hypokalemia. Patient recently started on Augementin on 9/6 and would like to discuss side effects of medication. Patient was prescribed Augmentin by Gastroenterologist. She denies vomiting but does state she is having some loose stools she denies fever chills sweats. Denies any wheezing or difficulty breathing.  Review of Systems  Constitutional: Negative for activity change, appetite change and fatigue.  HENT: Negative for congestion.   Respiratory: Negative for cough.   Cardiovascular: Negative for chest pain.  Gastrointestinal: Negative for abdominal pain.  Endocrine: Negative for polydipsia and polyphagia.  Neurological: Negative for weakness.  Psychiatric/Behavioral: Negative for confusion.      15 minutes spent with patient reviewing her medicine list talking with her about how she is doing. I'm pleased with how she is doing I don't recommend lab work currently Objective:   Physical Exam  Constitutional: She appears well-nourished. No distress.  Cardiovascular: Normal rate, regular rhythm and normal heart sounds.   No murmur heard. Pulmonary/Chest: Effort normal and breath sounds normal. No respiratory distress.  Musculoskeletal: She exhibits no edema.  Lymphadenopathy:    She has no cervical adenopathy.  Neurological: She is alert. She exhibits normal muscle tone.  Psychiatric: Her behavior is normal.  Vitals reviewed.         Assessment & Plan:  I believe this patient is actually doing a good bit better. Her gastroenterologist is working with her with probable bacterial overgrowth No TB seen. Infectious disease doctor gave a good report She has gained couple pounds back she needs to improve her appetite She was cautioned to minimize the use of muscle relaxers/nerve medication She is encouraged to  follow-up with [place] again in 2 months time for recheck.

## 2015-03-30 NOTE — Telephone Encounter (Signed)
I have called [facility]. They are going to fax the Electrolytes for 2 of the stools to our office. The one for 03/15/15 appears that it may report out today or tomorrow per [facility].

## 2015-04-04 LAB — ELECTROLYTES, QUANTITATIVE, FECES
CHOLORIDE, FECES: 47 meq/L
Potassium, Feces: 10 mEq/L
Sodium, Feces: 150.1 mEq/L

## 2015-04-13 ENCOUNTER — Other Ambulatory Visit: Payer: Self-pay | Admitting: *Deleted

## 2015-04-13 ENCOUNTER — Other Ambulatory Visit: Payer: Self-pay | Admitting: Family Medicine

## 2015-04-13 MED ORDER — LEVOTHYROXINE SODIUM 25 MCG PO TABS: ORAL_TABLET | ORAL | Status: DC

## 2015-04-14 ENCOUNTER — Encounter (HOSPITAL_COMMUNITY)
Admission: RE | Admit: 2015-04-14 | Discharge: 2015-04-14 | Disposition: A | Discharge status: Home / Self Care | Payer: Medicare Other | Source: Ambulatory Visit | Attending: Family Medicine | Admitting: Family Medicine

## 2015-04-14 DIAGNOSIS — M810 Age-related osteoporosis without current pathological fracture: Secondary | ICD-10-CM | POA: Diagnosis not present

## 2015-04-14 LAB — RENAL FUNCTION PANEL
ALBUMIN: 3.6 g/dL (ref 3.5–5.0)
ANION GAP: 4 — AB (ref 5–15)
BUN: 29 mg/dL — ABNORMAL HIGH (ref 6–20)
CALCIUM: 9.8 mg/dL (ref 8.9–10.3)
CO2: 28 mmol/L (ref 22–32)
Chloride: 105 mmol/L (ref 101–111)
Creatinine, Ser: 1.18 mg/dL — ABNORMAL HIGH (ref 0.44–1.00)
GFR calc Af Amer: 48 mL/min — ABNORMAL LOW (ref >60)
GFR, EST NON AFRICAN AMERICAN: 41 mL/min — AB (ref >60)
Glucose, Bld: 136 mg/dL — ABNORMAL HIGH (ref 65–99)
PHOSPHORUS: 4.9 mg/dL — AB (ref 2.5–4.6)
POTASSIUM: 5.4 mmol/L — AB (ref 3.5–5.1)
Sodium: 137 mmol/L (ref 135–145)

## 2015-04-14 MED ORDER — IBANDRONATE SODIUM 3 MG/3ML IV SOLN
INTRAVENOUS | Status: AC
  Filled 2015-04-14: qty 3

## 2015-04-14 MED ORDER — IBANDRONATE SODIUM 3 MG/3ML IV SOLN
3.0000 mg | Freq: Once | INTRAVENOUS | Status: AC
  Administered 2015-04-14: 3 mg via INTRAVENOUS

## 2015-04-17 ENCOUNTER — Other Ambulatory Visit: Payer: Self-pay

## 2015-04-17 DIAGNOSIS — Z79899 Other long term (current) drug therapy: Secondary | ICD-10-CM

## 2015-04-17 NOTE — Progress Notes (Signed)
Results for [name], [name] (MRN [id number]) as of 04/17/2015 10:44  Ref. Range 04/14/2015 15:17  Sodium Latest Ref Range: 135-145 mmol/L 137  Potassium Latest Ref Range: 3.5-5.1 mmol/L 5.4 (H)  Chloride Latest Ref Range: 101-111 mmol/L 105  CO2 Latest Ref Range: 22-32 mmol/L 28  BUN Latest Ref Range: 6-20 mg/dL 29 (H)  Creatinine Latest Ref Range: 0.44-1.00 mg/dL 1.18 (H)  Calcium Latest Ref Range: 8.9-10.3 mg/dL 9.8  EGFR (Non-African Amer.) Latest Ref Range: >60 mL/min 41 (L)  EGFR (African American) Latest Ref Range: >60 mL/min 48 (L)  Glucose Latest Ref Range: 65-99 mg/dL 136 (H)  Anion gap Latest Ref Range: 5-15  4 (L)  Phosphorus Latest Ref Range: 2.5-4.6 mg/dL 4.9 (H)  Albumin Latest Ref Range: 3.5-5.0 g/dL 3.6

## 2015-04-20 ENCOUNTER — Encounter (INDEPENDENT_AMBULATORY_CARE_PROVIDER_SITE_OTHER): Payer: Self-pay | Admitting: Internal Medicine

## 2015-04-20 ENCOUNTER — Ambulatory Visit (INDEPENDENT_AMBULATORY_CARE_PROVIDER_SITE_OTHER): Payer: Medicare Other | Admitting: Internal Medicine

## 2015-04-20 VITALS — BP 104/70 | HR 62 | Temp 98.0°F | Resp 18 | Ht 59.0 in | Wt 81.1 lb

## 2015-04-20 DIAGNOSIS — R634 Abnormal weight loss: Secondary | ICD-10-CM | POA: Diagnosis not present

## 2015-04-20 DIAGNOSIS — K529 Noninfective gastroenteritis and colitis, unspecified: Secondary | ICD-10-CM

## 2015-04-20 MED ORDER — PANCRELIPASE (LIP-PROT-AMYL) 24000-76000 UNITS PO CPEP: 48000.0000 [IU] | ORAL_CAPSULE | Freq: Three times a day (TID) | ORAL | Status: DC

## 2015-04-20 NOTE — Patient Instructions (Addendum)
Determine stool output for 24 hours prior to starting Creon.DE Determine stool output for 24 hours x 2 while on Creon.  Hemoccult x 2   Progress report when you're close to finishing samples

## 2015-04-20 NOTE — Progress Notes (Signed)
Presenting complaint;  Follow-up for diarrhea bloating and weight loss.  Subjective:  Patient is 81 year old Caucasian female who is here for scheduled visit. She has history of gastroparesis GERD and also felt to have small intestinal bacterial overgrowth. She was last seen on 02/20/2015. She is been experiencing worsening diarrhea and intractable bloating. Stool studies were obtained for electrolytes and also modality. She was treated with 10 days of Augmentin and noted improvement in her symptoms. However she has not gained any weight. She has lost another pound. She had single episode of hemoptysis and also had lung lesion but workup has been negative for TB. She is accompanied by her niece and her helper. She denies nausea or vomiting. She states she has a good appetite but she only eats small meals a day. She feels she is passing less gas than she did before she is not sure if stool output has decreased. Her niece is concerned about her neurotrophic and cyclotropic medications and wonders if she could come off some of these medications. 7 months ago patient did fall out of bed while she was sleeping but she is not had any episodes while ambulating. Her helper states she has had dark stools on few occasions but no frank bleeding reported.    Current Medications: Outpatient Encounter Prescriptions as of 04/20/2015  Medication Sig  . ATROVENT HFA 17 MCG/ACT inhaler 2 PUFFS FOUR TIMES DAILY. (Patient taking differently: TAKE TWO PUFFS VIA INHALATION TWICE DAILY AS NEEDED)  . beta carotene w/minerals (OCUVITE) tablet Take 1 tablet by mouth daily.  . citalopram (CELEXA) 10 MG tablet TAKE (1) TABLET BY MOUTH ONCE DAILY.  . diazepam (VALIUM) 5 MG tablet TAKE 1/2 TABLET EVERY 6 HOURS AS NEEDED AND 1 TABLET AT BEDTIME.  . feeding supplement (BOOST HIGH PROTEIN) LIQD Take 2 Containers by mouth 2 (two) times daily between meals.  Marland Kitchen levothyroxine (SYNTHROID, LEVOTHROID) 25 MCG tablet TAKE 2 TABLETS ON  MONDAY AND FRIDAY AND ONE TABLET ALL OTHER DAYS.  Marland Kitchen memantine (NAMENDA) 10 MG tablet TAKE 1 TABLET TWICE A DAY FOR DEMENTIA.  Marland Kitchen metoprolol tartrate (LOPRESSOR) 25 MG tablet Take one half tablet BID  . mirtazapine (REMERON) 15 MG tablet Take 15 mg by mouth at bedtime. Patient states that she takes 1/2 tablet at night.  . pantoprazole (PROTONIX) 40 MG tablet TAKE ONE TABLET DAILY FOR ACID REFLUX.  Marland Kitchen potassium chloride (K-DUR) 10 MEQ tablet TAKE (1) TABLET BY MOUTH ONCE DAILY.  Marland Kitchen topiramate (TOPAMAX) 25 MG tablet TAKE (1) TABLET TWICE DAILY.  Marland Kitchen venlafaxine XR (EFFEXOR-XR) 75 MG 24 hr capsule TAKE 1 CAPSULE ONCE DAILY WITH BREAKFAST.  Marland Kitchen HYDROcodone-acetaminophen (NORCO/VICODIN) 5-325 MG per tablet TAKE (1) TABLET EVERY FOUR HOURS AS NEEDED. (Patient not taking: Reported on 04/20/2015)  . Multiple Vitamin (MULTIVITAMIN) tablet Take 1 tablet by mouth daily.  . [DISCONTINUED] amoxicillin-clavulanate (AUGMENTIN) 500-125 MG per tablet Take 1 tablet (500 mg total) by mouth 3 (three) times daily. (Patient not taking: Reported on 04/20/2015)  . [DISCONTINUED] calcium carbonate (OS-CAL) 600 MG TABS tablet Take by mouth daily.  . [DISCONTINUED] feeding supplement, RESOURCE BREEZE, (RESOURCE BREEZE) LIQD Take 1 Container by mouth 2 (two) times daily between meals. (Patient not taking: Reported on 04/20/2015)   No facility-administered encounter medications on file as of 04/20/2015.     Objective: Blood pressure 104/70, pulse 62, temperature 98 F (36.7 C), temperature source Oral, resp. rate 18, height '4\' 11"'$  (1.499 m), weight 81 lb 1.6 oz (36.787 kg). Patient is alert and  in no acute distress. She is very thin. Conjunctiva is pink. Sclera is nonicteric Oropharyngeal mucosa is normal. No neck masses or thyromegaly noted. Cardiac exam with regular rhythm normal S1 and S2. No murmur or gallop noted. Lungs are clear to auscultation. Abdomen. She has ileostomy in right low quadrant of her abdomen. Ileostomy bag  has liquid greenish brown stool with some solid pieces. Bowel sounds are normal. On palpation abdomen is soft and nontender without organomegaly or masses. No LE edema or clubbing noted.  Labs/studies Results: Stool studies from 03/21/2015  Fecal sodium 128 mEq per liter  Fecal potassium 16 mEq per liter  Fecal chloride 63 mg/L  Fecal osmolality is 443 mOsm Calculated fecal osmolality 288 mOsm Stool osmotic gap is 155     Assessment:  #1. Diarrhea with excessive flatulence. Patient has been treated with antibiotic for possible small intestinal bacterial overgrowth but symptom control has not been satisfactory. Stool studies are consistent with osmotic diarrhea and she therefore may have malabsorptive syndrome and possibly pancreatic insufficiency as she has history of pancreatitis in the past. #2. Chronic GERD. Heartburn is well controlled with therapy. #3. History of mild gastroparesis. Presently on dietary measures. #4. Weight loss. While she is only lost 1 pound in 3 weeks she has lost close to 15 pounds in the last 4 years.   Plan:  Empiric trial with Creon 24K two capsules with each meal and one with snack. Patient was given samples of 180 capsules. Patient's helper will determine stool output before initiating therapy and then within a week or so for 24 hours 2. Hemoccult 1 now and to be repeated if she has dark or tarry stools. Patient will call with progress report when she is close to finishing Creon samples at which time we'll determine whether or not she should continue with pancreatic enzyme supplement or not. Patient advised to review her meds with Dr. Wolfgang Phoenix to determine if there is room for change. Office visit in 3 months.

## 2015-04-25 ENCOUNTER — Telehealth (INDEPENDENT_AMBULATORY_CARE_PROVIDER_SITE_OTHER): Payer: Self-pay | Admitting: *Deleted

## 2015-04-25 NOTE — Telephone Encounter (Signed)
   Diagnosis:    Result(s)   Card 1: Positive         Completed by:    HEMOCCULT SENSA DEVELOPER: LOT#:  [id number] EXPIRATION DATE: 9-17   HEMOCCULT SENSA CARD:  LOT#:  02/14 EXPIRATION DATE: 07/18   CARD CONTROL RESULTS:  POSITIVE: Positive NEGATIVE: Negative    ADDITIONAL COMMENTS: Forwarded to [place] for review.

## 2015-05-01 ENCOUNTER — Telehealth (INDEPENDENT_AMBULATORY_CARE_PROVIDER_SITE_OTHER): Payer: Self-pay | Admitting: *Deleted

## 2015-05-01 NOTE — Telephone Encounter (Signed)
Stool is heme positive 

## 2015-05-01 NOTE — Telephone Encounter (Signed)
[name] would like to speak with [name]. It is about her having diarrhea and medicine is not helping. The return phone number is [phone].

## 2015-05-02 ENCOUNTER — Other Ambulatory Visit: Payer: Self-pay | Admitting: Family Medicine

## 2015-05-02 NOTE — Telephone Encounter (Signed)
May refill this +2 additional. 

## 2015-05-04 NOTE — Telephone Encounter (Signed)
Talked with the patient. She states that she has had diarrhea last week and this week. She is taking Pepto Bismuth over a tablespoon at the time. This helps. Patient feels that the Creon is working.  Per Dr.Rehman the patient may take Imodium 2 mg - one by mouth up to 3 times a day.  Patient was called back , spoke wiht her caregiver , Nevin Bloodgood. She was given the instructions. Ask that they call our office prior to 11:30 tomorrow if this didn't help.

## 2015-05-05 ENCOUNTER — Encounter (INDEPENDENT_AMBULATORY_CARE_PROVIDER_SITE_OTHER): Payer: Self-pay

## 2015-05-08 ENCOUNTER — Other Ambulatory Visit: Payer: Self-pay | Admitting: Family Medicine

## 2015-05-09 ENCOUNTER — Other Ambulatory Visit: Payer: Self-pay | Admitting: Family Medicine

## 2015-05-29 ENCOUNTER — Ambulatory Visit (INDEPENDENT_AMBULATORY_CARE_PROVIDER_SITE_OTHER): Payer: Medicare Other | Admitting: Internal Medicine

## 2015-05-30 ENCOUNTER — Encounter: Payer: Self-pay | Admitting: Family Medicine

## 2015-05-30 ENCOUNTER — Ambulatory Visit (INDEPENDENT_AMBULATORY_CARE_PROVIDER_SITE_OTHER): Payer: Medicare Other | Admitting: Family Medicine

## 2015-05-30 VITALS — BP 108/60 | Ht 59.0 in | Wt 80.0 lb

## 2015-05-30 DIAGNOSIS — E119 Type 2 diabetes mellitus without complications: Secondary | ICD-10-CM | POA: Diagnosis not present

## 2015-05-30 DIAGNOSIS — E039 Hypothyroidism, unspecified: Secondary | ICD-10-CM

## 2015-05-30 DIAGNOSIS — N289 Disorder of kidney and ureter, unspecified: Secondary | ICD-10-CM

## 2015-05-30 DIAGNOSIS — R634 Abnormal weight loss: Secondary | ICD-10-CM

## 2015-05-30 DIAGNOSIS — R54 Age-related physical debility: Secondary | ICD-10-CM | POA: Diagnosis not present

## 2015-05-30 DIAGNOSIS — R627 Adult failure to thrive: Secondary | ICD-10-CM | POA: Insufficient documentation

## 2015-05-30 MED ORDER — DIAZEPAM 5 MG PO TABS: ORAL_TABLET | ORAL | Status: DC

## 2015-05-30 MED ORDER — TOPIRAMATE 25 MG PO TABS: 25.0000 mg | ORAL_TABLET | Freq: Every day | ORAL | Status: DC

## 2015-05-30 NOTE — Progress Notes (Signed)
   Subjective:    Patient ID: [name], female    DOB: August 21, 1928, 81 y.o.   MRN: [id number]  HPI Patient is here today for a follow up visit for weight loss. Patient states that when she wakes up in the mornings, she is very nervous. Onset several months ago.  Patient has no other concerns at this time.  Patient relates fatigue tiredness. Patient also states she feels stressed at times in addition to this denies being depressed. Does not think her eating is changed. Patient denies bleeding issues denies vomiting has seen gastroenterologist recently she relates one episode where she fell out of bed she wasn't quite sure what happened there. Denies any other episodes of falling. She does relate fatigue and tiredness and feeling rundown. Denies fever sweats chills. She has had workup regarding lesion in her chest it was not felt to be cancerous. Review of Systems     Objective:   Physical Exam Patient cachectic but alert not toxic lungs are clear not tachypnea heart regular not tachycardic no gallop abdomen soft extremities no edema skin warm dry patient's fingers are slightly cyanotic with slight coolness patient states this happens to her.   25 minutes was spent with the patient. Greater than half the time was spent in discussion and answering questions and counseling regarding the issues that the patient came in for today.     Assessment & Plan:  Weight loss-it is concerning this patient is loss a couple pounds she states she is eating okay but I encouraged her to increase the amount of back of diet that she is taking in the try to get her calorie intake better I recommend following up within 6 weeks  Renal insufficiency check metabolic 7 to make sure there is not a significant problem here  Because of the weight loss also check thyroid function  Because of fatigue and tiredness we will check CBC as well as liver function.  Because of patient's smaller weight plus also one episode of  falling during the night several weeks back I am stopping Remeron also decreasing Valium to a half a tablet during the day when necessary 1 and evening time-she is been on this for years, and reducing Topamax to once daily. Trying to scale back some of the medications this patient takes.

## 2015-05-31 ENCOUNTER — Telehealth: Payer: Self-pay | Admitting: Family Medicine

## 2015-05-31 LAB — CBC WITH DIFFERENTIAL/PLATELET
BASOS: 1 %
Basophils Absolute: 0.1 10*3/uL (ref 0.0–0.2)
EOS (ABSOLUTE): 0.1 10*3/uL (ref 0.0–0.4)
EOS: 1 %
HEMATOCRIT: 46.2 % (ref 34.0–46.6)
HEMOGLOBIN: 14.8 g/dL (ref 11.1–15.9)
Immature Grans (Abs): 0 10*3/uL (ref 0.0–0.1)
Immature Granulocytes: 0 %
LYMPHS: 25 %
Lymphocytes Absolute: 2.9 10*3/uL (ref 0.7–3.1)
MCH: 31.3 pg (ref 26.6–33.0)
MCHC: 32 g/dL (ref 31.5–35.7)
MCV: 98 fL — AB (ref 79–97)
Monocytes Absolute: 1.4 10*3/uL — ABNORMAL HIGH (ref 0.1–0.9)
Monocytes: 12 %
NEUTROS PCT: 61 %
Neutrophils Absolute: 7.3 10*3/uL — ABNORMAL HIGH (ref 1.4–7.0)
Platelets: 350 10*3/uL (ref 150–379)
RBC: 4.73 x10E6/uL (ref 3.77–5.28)
RDW: 14.6 % (ref 12.3–15.4)
WBC: 11.8 10*3/uL — AB (ref 3.4–10.8)

## 2015-05-31 LAB — BASIC METABOLIC PANEL
BUN / CREAT RATIO: 23 (ref 11–26)
BUN: 33 mg/dL — ABNORMAL HIGH (ref 8–27)
CO2: 19 mmol/L (ref 18–29)
CREATININE: 1.46 mg/dL — AB (ref 0.57–1.00)
Calcium: 10.5 mg/dL — ABNORMAL HIGH (ref 8.7–10.3)
Chloride: 99 mmol/L (ref 97–106)
GFR, EST AFRICAN AMERICAN: 38 mL/min/{1.73_m2} — AB (ref >59)
GFR, EST NON AFRICAN AMERICAN: 33 mL/min/{1.73_m2} — AB (ref >59)
Glucose: 222 mg/dL — ABNORMAL HIGH (ref 65–99)
POTASSIUM: 5.3 mmol/L — AB (ref 3.5–5.2)
SODIUM: 137 mmol/L (ref 136–144)

## 2015-05-31 LAB — HEPATIC FUNCTION PANEL
ALBUMIN: 4.4 g/dL (ref 3.5–4.7)
ALK PHOS: 153 IU/L — AB (ref 39–117)
ALT: 16 IU/L (ref 0–32)
AST: 21 IU/L (ref 0–40)
Bilirubin Total: 0.4 mg/dL (ref 0.0–1.2)
Bilirubin, Direct: 0.17 mg/dL (ref 0.00–0.40)
Total Protein: 8.1 g/dL (ref 6.0–8.5)

## 2015-05-31 LAB — TSH: TSH: 0.464 u[IU]/mL (ref 0.450–4.500)

## 2015-05-31 NOTE — Addendum Note (Signed)
Addended by: Ofilia Neas R on: 05/31/2015 10:50 AM   Modules accepted: Orders

## 2015-05-31 NOTE — Telephone Encounter (Signed)
Called and spoke with patient and patient informed me that she has been feeling extremely weak, and has been coughing up blood in the mornings.  Offered patient and appointment to be seen with Dr.Scott Luking tomorrow 06/01/15. Patient verbalized understanding and stated that she prefers not to come in tomorrow but would like to be seen next week. Urged patient that if symptoms persist of worsen tonight to go to the ER. Patient verbalized understanding and was transferred to front desk to schedule appointment for next week.

## 2015-05-31 NOTE — Telephone Encounter (Signed)
The patient was offered office visit for Thursday patient deferred we will handle this problem thoroughly when she follows up.

## 2015-06-05 ENCOUNTER — Encounter: Payer: Self-pay | Admitting: Family Medicine

## 2015-06-05 ENCOUNTER — Other Ambulatory Visit: Payer: Self-pay | Admitting: Family Medicine

## 2015-06-05 ENCOUNTER — Ambulatory Visit (INDEPENDENT_AMBULATORY_CARE_PROVIDER_SITE_OTHER): Payer: Medicare Other | Admitting: Family Medicine

## 2015-06-05 VITALS — BP 118/82 | Ht 59.0 in | Wt 80.2 lb

## 2015-06-05 DIAGNOSIS — R042 Hemoptysis: Secondary | ICD-10-CM | POA: Diagnosis not present

## 2015-06-05 DIAGNOSIS — N289 Disorder of kidney and ureter, unspecified: Secondary | ICD-10-CM | POA: Diagnosis not present

## 2015-06-05 DIAGNOSIS — J984 Other disorders of lung: Secondary | ICD-10-CM

## 2015-06-05 NOTE — Progress Notes (Signed)
   Subjective:    Patient ID: [name], female    DOB: 1928-11-28, 81 y.o.   MRN: [id number]  HPI Patient in today for fatigue, and hemoptysis . Patient states onset of symptoms in July. Patient had Hospital visit for hemoptysis in July 2016.  This patient is having hemoptysis. Happening on a daily basis. Please see discussion below. Concerning for the possibility of lung cancer. She denies fever chills sweats. She relates her appetite is fair with some weight loss over the past 6 months. Patient states no other concerns this visit. Long discussion held regarding the patient's CAT scan history infectious disease workup and workup for lung cancer patient does not want to go back to local pulmonology.  Review of Systems     Objective:   Physical Exam Neck no masses lungs are clear diminished breath sounds heart sounds are distant pulses normal blood pressure good abdomen soft extremities no edema  25 minutes was spent with the patient. Greater than half the time was spent in discussion and answering questions and counseling regarding the issues that the patient came in for today.      Assessment & Plan:  Hemoptysis-this is very concerning her last scan showed cavitary lesions they were concerned about TB she saw infectious disease they told her she did not had TB this patient is having ongoing hemoptysis over the past couple weeks coughing up of hand full of blood on a regular basis she is not gaining weight she is getting weak and states that she experiences some shortness of breath I am concerned about the possibility of lung cancer I recommend that we do a CT scan of her chest preferably with contrast but she does have a history of renal insufficiency severe for we will need to check a metabolic 7 before the patient has this CAT scan. She may end up needing to be referred I am concerned though that this patient is not a good surgical candidate because of her frailty and her loss of  weight.  The patient states that she has a hard nodule at the ostomy where her colostomy has she states that she will bring changing equipment with her for the next visit so that this can be checked. She does not one it checked today

## 2015-06-06 LAB — BASIC METABOLIC PANEL
BUN / CREAT RATIO: 25 (ref 11–26)
BUN: 26 mg/dL (ref 8–27)
CALCIUM: 10 mg/dL (ref 8.7–10.3)
CHLORIDE: 99 mmol/L (ref 97–106)
CO2: 23 mmol/L (ref 18–29)
CREATININE: 1.05 mg/dL — AB (ref 0.57–1.00)
GFR calc Af Amer: 56 mL/min/{1.73_m2} — ABNORMAL LOW (ref >59)
GFR, EST NON AFRICAN AMERICAN: 49 mL/min/{1.73_m2} — AB (ref >59)
Glucose: 118 mg/dL — ABNORMAL HIGH (ref 65–99)
Potassium: 4.8 mmol/L (ref 3.5–5.2)
SODIUM: 137 mmol/L (ref 136–144)

## 2015-06-12 ENCOUNTER — Ambulatory Visit (HOSPITAL_COMMUNITY)
Admission: RE | Admit: 2015-06-12 | Discharge: 2015-06-12 | Disposition: A | Discharge status: Home / Self Care | Payer: Medicare Other | Source: Ambulatory Visit | Attending: Family Medicine | Admitting: Family Medicine

## 2015-06-12 DIAGNOSIS — R918 Other nonspecific abnormal finding of lung field: Secondary | ICD-10-CM | POA: Insufficient documentation

## 2015-06-12 DIAGNOSIS — Z9889 Other specified postprocedural states: Secondary | ICD-10-CM | POA: Diagnosis not present

## 2015-06-12 DIAGNOSIS — R042 Hemoptysis: Secondary | ICD-10-CM | POA: Diagnosis not present

## 2015-06-12 DIAGNOSIS — Z9049 Acquired absence of other specified parts of digestive tract: Secondary | ICD-10-CM | POA: Insufficient documentation

## 2015-06-12 DIAGNOSIS — I2510 Atherosclerotic heart disease of native coronary artery without angina pectoris: Secondary | ICD-10-CM | POA: Diagnosis not present

## 2015-06-12 MED ORDER — IOHEXOL 300 MG/ML  SOLN
75.0000 mL | Freq: Once | INTRAMUSCULAR | Status: AC | PRN
  Administered 2015-06-12: 75 mL via INTRAVENOUS

## 2015-06-14 ENCOUNTER — Telehealth (INDEPENDENT_AMBULATORY_CARE_PROVIDER_SITE_OTHER): Payer: Self-pay | Admitting: *Deleted

## 2015-06-14 ENCOUNTER — Encounter (HOSPITAL_COMMUNITY): Payer: Self-pay | Admitting: *Deleted

## 2015-06-14 ENCOUNTER — Emergency Department (HOSPITAL_COMMUNITY)
Admission: EM | Admit: 2015-06-14 | Discharge: 2015-06-14 | Disposition: A | Discharge status: Home / Self Care | Payer: Medicare Other | Attending: Emergency Medicine | Admitting: Emergency Medicine

## 2015-06-14 ENCOUNTER — Emergency Department (HOSPITAL_COMMUNITY): Payer: Medicare Other

## 2015-06-14 DIAGNOSIS — H9202 Otalgia, left ear: Secondary | ICD-10-CM | POA: Insufficient documentation

## 2015-06-14 DIAGNOSIS — J449 Chronic obstructive pulmonary disease, unspecified: Secondary | ICD-10-CM | POA: Insufficient documentation

## 2015-06-14 DIAGNOSIS — K219 Gastro-esophageal reflux disease without esophagitis: Secondary | ICD-10-CM | POA: Insufficient documentation

## 2015-06-14 DIAGNOSIS — G8929 Other chronic pain: Secondary | ICD-10-CM | POA: Insufficient documentation

## 2015-06-14 DIAGNOSIS — Z862 Personal history of diseases of the blood and blood-forming organs and certain disorders involving the immune mechanism: Secondary | ICD-10-CM | POA: Insufficient documentation

## 2015-06-14 DIAGNOSIS — F411 Generalized anxiety disorder: Secondary | ICD-10-CM | POA: Insufficient documentation

## 2015-06-14 DIAGNOSIS — R531 Weakness: Secondary | ICD-10-CM | POA: Diagnosis not present

## 2015-06-14 DIAGNOSIS — D72829 Elevated white blood cell count, unspecified: Secondary | ICD-10-CM | POA: Insufficient documentation

## 2015-06-14 DIAGNOSIS — Z8619 Personal history of other infectious and parasitic diseases: Secondary | ICD-10-CM | POA: Insufficient documentation

## 2015-06-14 DIAGNOSIS — R05 Cough: Secondary | ICD-10-CM | POA: Diagnosis not present

## 2015-06-14 DIAGNOSIS — R000 Tachycardia, unspecified: Secondary | ICD-10-CM | POA: Diagnosis not present

## 2015-06-14 DIAGNOSIS — Z87448 Personal history of other diseases of urinary system: Secondary | ICD-10-CM | POA: Insufficient documentation

## 2015-06-14 DIAGNOSIS — R509 Fever, unspecified: Secondary | ICD-10-CM | POA: Diagnosis not present

## 2015-06-14 DIAGNOSIS — G43909 Migraine, unspecified, not intractable, without status migrainosus: Secondary | ICD-10-CM | POA: Diagnosis not present

## 2015-06-14 DIAGNOSIS — Z79899 Other long term (current) drug therapy: Secondary | ICD-10-CM | POA: Diagnosis not present

## 2015-06-14 HISTORY — DX: Colostomy status: Z93.3

## 2015-06-14 LAB — COMPREHENSIVE METABOLIC PANEL
ALT: 15 U/L (ref 14–54)
AST: 31 U/L (ref 15–41)
Albumin: 3.9 g/dL (ref 3.5–5.0)
Alkaline Phosphatase: 146 U/L — ABNORMAL HIGH (ref 38–126)
Anion gap: 9 (ref 5–15)
BILIRUBIN TOTAL: 0.9 mg/dL (ref 0.3–1.2)
BUN: 20 mg/dL (ref 6–20)
CHLORIDE: 100 mmol/L — AB (ref 101–111)
CO2: 29 mmol/L (ref 22–32)
CREATININE: 1.07 mg/dL — AB (ref 0.44–1.00)
Calcium: 9.4 mg/dL (ref 8.9–10.3)
GFR calc Af Amer: 54 mL/min — ABNORMAL LOW (ref >60)
GFR, EST NON AFRICAN AMERICAN: 46 mL/min — AB (ref >60)
GLUCOSE: 91 mg/dL (ref 65–99)
Potassium: 3.8 mmol/L (ref 3.5–5.1)
Sodium: 138 mmol/L (ref 135–145)
Total Protein: 8.1 g/dL (ref 6.5–8.1)

## 2015-06-14 LAB — URINALYSIS, ROUTINE W REFLEX MICROSCOPIC
Bilirubin Urine: NEGATIVE
GLUCOSE, UA: NEGATIVE mg/dL
HGB URINE DIPSTICK: NEGATIVE
Ketones, ur: NEGATIVE mg/dL
Leukocytes, UA: NEGATIVE
Nitrite: NEGATIVE
PH: 5 (ref 5.0–8.0)
PROTEIN: NEGATIVE mg/dL
Specific Gravity, Urine: 1.02 (ref 1.005–1.030)

## 2015-06-14 LAB — LIPASE, BLOOD: LIPASE: 21 U/L (ref 11–51)

## 2015-06-14 LAB — CBC WITH DIFFERENTIAL/PLATELET
BASOS ABS: 0.1 10*3/uL (ref 0.0–0.1)
Basophils Relative: 0 %
Eosinophils Absolute: 0.1 10*3/uL (ref 0.0–0.7)
Eosinophils Relative: 0 %
HEMATOCRIT: 43.3 % (ref 36.0–46.0)
Hemoglobin: 14.2 g/dL (ref 12.0–15.0)
LYMPHS PCT: 15 %
Lymphs Abs: 3.2 10*3/uL (ref 0.7–4.0)
MCH: 31.7 pg (ref 26.0–34.0)
MCHC: 32.8 g/dL (ref 30.0–36.0)
MCV: 96.7 fL (ref 78.0–100.0)
Monocytes Absolute: 2 10*3/uL — ABNORMAL HIGH (ref 0.1–1.0)
Monocytes Relative: 10 %
NEUTROS ABS: 15.6 10*3/uL — AB (ref 1.7–7.7)
NEUTROS PCT: 75 %
PLATELETS: 272 10*3/uL (ref 150–400)
RBC: 4.48 MIL/uL (ref 3.87–5.11)
RDW: 14.9 % (ref 11.5–15.5)
WBC: 21 10*3/uL — AB (ref 4.0–10.5)

## 2015-06-14 LAB — I-STAT TROPONIN, ED: Troponin i, poc: 0.02 ng/mL (ref 0.00–0.08)

## 2015-06-14 LAB — LACTIC ACID, PLASMA: LACTIC ACID, VENOUS: 1 mmol/L (ref 0.5–2.0)

## 2015-06-14 MED ORDER — SODIUM CHLORIDE 0.9 % IV BOLUS (SEPSIS)
250.0000 mL | Freq: Once | INTRAVENOUS | Status: AC
  Administered 2015-06-14: 250 mL via INTRAVENOUS

## 2015-06-14 MED ORDER — ACETAMINOPHEN 325 MG PO TABS
650.0000 mg | ORAL_TABLET | Freq: Once | ORAL | Status: AC
  Administered 2015-06-14: 650 mg via ORAL
  Filled 2015-06-14: qty 2

## 2015-06-14 MED ORDER — SODIUM CHLORIDE 0.9 % IV SOLN
INTRAVENOUS | Status: DC
  Administered 2015-06-14: 17:00:00 via INTRAVENOUS

## 2015-06-14 NOTE — ED Notes (Signed)
Patient c/o weakness and body aches x 3 days. Denies abd pain n/v/d. Told to come here by Dr. Olevia Perches office.

## 2015-06-14 NOTE — ED Notes (Addendum)
MD at bedside. And is aware of pt's current vital signs

## 2015-06-14 NOTE — ED Provider Notes (Signed)
CSN: [id number]     Arrival date & time 06/14/15  1434 History   First MD Initiated Contact with Patient 06/14/15 1527     Chief Complaint  Patient presents with  . Generalized Body Aches     (Consider location/radiation/quality/duration/timing/severity/associated sxs/prior Treatment) The history is provided by the patient.   Patient is an 81 year old female followed by Dr. Karin Golden. Patient with complaint of weakness and some body aches for 3 days. Patient felt as if she may have a fever and chills no documented fever. Spent a cough for 5-6 months. Patient had a CT of her chest done just on Monday without any acute findings. Patient did have complaint of some left ear pain earlier but has resolved. No abdominal pain no chest pain no nausea no vomiting or diarrhea no pain with urination. No headache. Past Medical History  Diagnosis Date  . Lower abdominal pain   . Gastroesophageal reflux disease   . IBS (irritable bowel syndrome)   . Migraines   . GAD (generalized anxiety disorder)   . Anemia   . Osteoporosis   . Tachycardia   . Chronic abdominal pain   . Gastroparesis   . COPD (chronic obstructive pulmonary disease) ([place])   . Lung mass   . Chronic neck and back pain   . 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 Renal insufficiency   . S/P colostomy [facility])    Past Surgical History  Procedure Laterality Date  . Stomach surgery for a bleeding ulcer when she was in her 12s    . Gastrectomy    . Tonsillectomy    . Appendectomy    . Cholecystectomy    . Abdominal hysterectomy    . Esophagogastroduodenoscopy     Family History  Problem Relation Age of Onset  . Stroke Mother   . Stroke Father   . Diabetes Sister   . Diabetes Brother   . Stroke Brother   . Lung cancer Sister   . Diabetes Sister   . Emphysema Sister   . Diabetes Brother   . Stroke Brother   . Diabetes Son   . Irritable bowel syndrome Son   . Diabetes Son   . Hypertension Son     Social History  Substance Use Topics  . Smoking status: Never Smoker   . Smokeless tobacco: Never Used  . Alcohol Use: No   OB History    Gravida Para Term Preterm AB TAB SAB Ectopic Multiple Living            3     Review of Systems  Constitutional: Positive for fever, chills and fatigue.  HENT: Positive for ear pain. Negative for congestion.   Eyes: Negative for visual disturbance.  Respiratory: Negative for shortness of breath.   Cardiovascular: Negative for chest pain.  Gastrointestinal: Negative for nausea, vomiting and abdominal pain.  Genitourinary: Negative for dysuria.  Musculoskeletal: Negative for back pain and neck pain.  Skin: Negative for rash.  Neurological: Positive for weakness. Negative for headaches.  Hematological: Does not bruise/bleed easily.  Psychiatric/Behavioral: Negative for confusion.      Allergies  Tetanus toxoid; Zolpidem tartrate; and Fosamax  Home Medications   Prior to Admission medications   Medication Sig Start Date End Date Taking? Authorizing Provider  ATROVENT HFA 17 MCG/ACT inhaler 2 PUFFS FOUR TIMES DAILY. Patient taking differently: TAKE TWO PUFFS VIA INHALATION TWICE DAILY AS NEEDED 08/23/14   Kathyrn Drown, MD  beta carotene w/minerals (OCUVITE)  tablet Take 1 tablet by mouth daily.    Historical Provider, MD  citalopram (CELEXA) 10 MG tablet TAKE (1) TABLET BY MOUTH ONCE DAILY. 05/08/15   Kathyrn Drown, MD  diazepam (VALIUM) 5 MG tablet TAKE 1/2 TABLET DURING THE DAY AS NEEDED AND 1 TABLET AT BEDTIME. 05/30/15   Mikey Kirschner, MD  feeding supplement (BOOST HIGH PROTEIN) LIQD Take 2 Containers by mouth 2 (two) times daily between meals.    Historical Provider, MD  HYDROcodone-acetaminophen (NORCO/VICODIN) 5-325 MG per tablet TAKE (1) TABLET EVERY FOUR HOURS AS NEEDED. 01/27/15   Kathyrn Drown, MD  levothyroxine (SYNTHROID, LEVOTHROID) 25 MCG tablet TAKE 2 TABLETS ON MONDAY AND FRIDAY. 05/08/15   Kathyrn Drown, MD  memantine  (NAMENDA) 10 MG tablet TAKE 1 TABLET TWICE A DAY FOR DEMENTIA. 05/09/15   Kathyrn Drown, MD  metoprolol tartrate (LOPRESSOR) 25 MG tablet TAKE 1/2 TABLET TWICE DAILY FOR HIGH BLOOD PRESSURE. 05/08/15   Kathyrn Drown, MD  Multiple Vitamin (MULTIVITAMIN) tablet Take 1 tablet by mouth daily.    Historical Provider, MD  Pancrelipase, Lip-Prot-Amyl, 24000 UNITS CPEP Take 2 capsules (48,000 Units total) by mouth 3 (three) times daily with meals. 04/20/15   Rogene Houston, MD  pantoprazole (PROTONIX) 40 MG tablet TAKE ONE TABLET DAILY FOR ACID REFLUX. 05/08/15   Kathyrn Drown, MD  potassium chloride (K-DUR) 10 MEQ tablet TAKE (1) TABLET BY MOUTH ONCE DAILY. 05/08/15   Kathyrn Drown, MD  topiramate (TOPAMAX) 25 MG tablet Take 1 tablet (25 mg total) by mouth daily. Patient not taking: Reported on 06/05/2015 05/30/15   Mikey Kirschner, MD  venlafaxine XR (EFFEXOR-XR) 75 MG 24 hr capsule TAKE 1 CAPSULE ONCE DAILY WITH BREAKFAST. 04/13/15   Kathyrn Drown, MD   BP 150/73 mmHg  Pulse 93  Temp(Src) 98.7 F (37.1 C) (Oral)  Resp 17  Ht '4\' 11"'$  (1.499 m)  Wt 36.741 kg  BMI 16.35 kg/m2  SpO2 96% Physical Exam  Constitutional: She is oriented to person, place, and time. She appears well-developed and well-nourished. No distress.  HENT:  Head: Normocephalic and atraumatic.  Left Ear: External ear normal.  Mouth/Throat: Oropharynx is clear and moist.  Eyes: Conjunctivae and EOM are normal. Pupils are equal, round, and reactive to light.  Neck: Normal range of motion. Neck supple.  Cardiovascular: Normal rate, regular rhythm and normal heart sounds.   No murmur heard. Pulmonary/Chest: Effort normal and breath sounds normal. No respiratory distress.  Abdominal: Soft. Bowel sounds are normal. There is no tenderness.  Musculoskeletal: Normal range of motion. She exhibits no edema.  Neurological: She is alert and oriented to person, place, and time. No cranial nerve deficit. She exhibits normal muscle  tone. Coordination normal.  Skin: Skin is warm. No rash noted.  Nursing note and vitals reviewed.   ED Course  Procedures (including critical care time) Labs Review Labs Reviewed  CBC WITH DIFFERENTIAL/PLATELET - Abnormal; Notable for the following:    WBC 21.0 (*)    Neutro Abs 15.6 (*)    Monocytes Absolute 2.0 (*)    All other components within normal limits  COMPREHENSIVE METABOLIC PANEL - Abnormal; Notable for the following:    Chloride 100 (*)    Creatinine, Ser 1.07 (*)    Alkaline Phosphatase 146 (*)    GFR calc non Af Amer 46 (*)    GFR calc Af Amer 54 (*)    All other components within normal limits  CULTURE, BLOOD (ROUTINE X 2)  CULTURE, BLOOD (ROUTINE X 2)  LIPASE, BLOOD  URINALYSIS, ROUTINE W REFLEX MICROSCOPIC (NOT AT [facility])  LACTIC ACID, PLASMA  I-STAT TROPOININ, ED  I-STAT TROPOININ, ED   Results for orders placed or performed during the hospital encounter of 06/14/15  CBC with Differential  Result Value Ref Range   WBC 21.0 (H) 4.0 - 10.5 K/uL   RBC 4.48 3.87 - 5.11 MIL/uL   Hemoglobin 14.2 12.0 - 15.0 g/dL   HCT 43.3 36.0 - 46.0 %   MCV 96.7 78.0 - 100.0 fL   MCH 31.7 26.0 - 34.0 pg   MCHC 32.8 30.0 - 36.0 g/dL   RDW 14.9 11.5 - 15.5 %   Platelets 272 150 - 400 K/uL   Neutrophils Relative % 75 %   Neutro Abs 15.6 (H) 1.7 - 7.7 K/uL   Lymphocytes Relative 15 %   Lymphs Abs 3.2 0.7 - 4.0 K/uL   Monocytes Relative 10 %   Monocytes Absolute 2.0 (H) 0.1 - 1.0 K/uL   Eosinophils Relative 0 %   Eosinophils Absolute 0.1 0.0 - 0.7 K/uL   Basophils Relative 0 %   Basophils Absolute 0.1 0.0 - 0.1 K/uL  Comprehensive metabolic panel  Result Value Ref Range   Sodium 138 135 - 145 mmol/L   Potassium 3.8 3.5 - 5.1 mmol/L   Chloride 100 (L) 101 - 111 mmol/L   CO2 29 22 - 32 mmol/L   Glucose, Bld 91 65 - 99 mg/dL   BUN 20 6 - 20 mg/dL   Creatinine, Ser 1.07 (H) 0.44 - 1.00 mg/dL   Calcium 9.4 8.9 - 10.3 mg/dL   Total Protein 8.1 6.5 - 8.1 g/dL   Albumin  3.9 3.5 - 5.0 g/dL   AST 31 15 - 41 U/L   ALT 15 14 - 54 U/L   Alkaline Phosphatase 146 (H) 38 - 126 U/L   Total Bilirubin 0.9 0.3 - 1.2 mg/dL   GFR calc non Af Amer 46 (L) >60 mL/min   GFR calc Af Amer 54 (L) >60 mL/min   Anion gap 9 5 - 15  Lipase, blood  Result Value Ref Range   Lipase 21 11 - 51 U/L  Urinalysis, Routine w reflex microscopic (not at [facility])  Result Value Ref Range   Color, Urine YELLOW YELLOW   APPearance CLEAR CLEAR   Specific Gravity, Urine 1.020 1.005 - 1.030   pH 5.0 5.0 - 8.0   Glucose, UA NEGATIVE NEGATIVE mg/dL   Hgb urine dipstick NEGATIVE NEGATIVE   Bilirubin Urine NEGATIVE NEGATIVE   Ketones, ur NEGATIVE NEGATIVE mg/dL   Protein, ur NEGATIVE NEGATIVE mg/dL   Nitrite NEGATIVE NEGATIVE   Leukocytes, UA NEGATIVE NEGATIVE  Lactic acid, plasma  Result Value Ref Range   Lactic Acid, Venous 1.0 0.5 - 2.0 mmol/L  I-Stat Troponin, ED (not at [facility])  Result Value Ref Range   Troponin i, poc 0.02 0.00 - 0.08 ng/mL   Comment 3             Imaging Review Dg Chest 2 View  06/14/2015  CLINICAL DATA:  Elevated white count and fever tonight. History of cavitary tuberculosis, tachycardia, COPD, gastrectomy. EXAM: CHEST  2 VIEW COMPARISON:  Chest x-ray dated 01/14/2015 and chest CT dated 06/12/2015. FINDINGS: Cardiomediastinal silhouette is stable in size and configuration. The scattered nodular opacities within each lung, upper lobe predominant, are unchanged, better characterized on the recent chest CT and again presumed sequela  of patient's known tuberculosis. No new lung findings are seen. No new opacity to suggest a developing consolidating pneumonia. No pleural effusion. No pneumothorax. IMPRESSION: Stable chest x-ray. Scattered nodular opacities within each lung, upper lobe predominant, are stable and presumed sequela of patient's known tuberculosis. No new lung findings. No evidence of new pneumonia or pleural effusion. Electronically Signed   By: Franki Cabot  M.D.   On: 06/14/2015 18:10   I have personally reviewed and evaluated these images and lab results as part of my medical decision-making.   EKG Interpretation   Date/Time:  Wednesday June 14 2015 17:10:13 EST Ventricular Rate:  85 PR Interval:  198 QRS Duration: 126 QT Interval:  436 QTC Calculation: 518 R Axis:   105 Text Interpretation:  Sinus rhythm Nonspecific intraventricular conduction  delay Lateral infarct, recent Anterior infarct, possibly acute Abnormal T,  consider ischemia, inferior leads No significant change since last tracing  Confirmed by Jacqulene Huntley  MD, [place] [phone]) on 06/14/2015 5:23:11 PM      MDM   Final diagnoses:  Leukocytosis    Patient with extensive workup for the weakness and fatigue feeling patient does feel somewhat improved after fluid hydration. The only abnormal finding was a leukocytosis. Discussed with hospitalist they concur that this can be worked up as an outpatient. Patient's lactic acid was negative chest x-ray repeat negative patient also had CT of chest just on Monday that showed no acute changes. Patient's urinalysis is negative urinary tract infection. Flexor lites eyes sniffing abnormalities. No hypotension no persistent tachycardia. Troponin was also negative lactic acid was negative. Patient stable for discharge home with close follow-up with primary care doctor.    Fredia Sorrow, MD 06/14/15 2224

## 2015-06-14 NOTE — Discharge Instructions (Signed)
For the high white blood cell count she will need close follow-up. Make an appointment to see your doctors. Return for any new or worse symptoms at all. We did do blood cultures if they turn positive he will be notified. Leukocytosis Leukocytosis means you have more white blood cells than normal. White blood cells are made in your bone marrow. The main job of white blood cells is to fight infection. Having too many white blood cells is a common condition. It can develop as a result of many types of medical problems. CAUSES  In some cases, your bone marrow may be normal, but it is still making too many white blood cells. This could be the result of:  Infection.  Injury.  Physical stress.  Emotional stress.  Surgery.  Allergic reactions.  Tumors that do not start in the blood or bone marrow.  An inherited disease.  Certain medicines.  Pregnancy and labor. In other cases, you may have a bone marrow disorder that is causing your body to make too many white blood cells. Bone marrow disorders include:  Leukemia. This is a type of blood cancer.  Myeloproliferative disorders. These disorders cause blood cells to grow abnormally. SYMPTOMS  Some people have no symptoms. Others have symptoms due to the medical problem that is causing their leukocytosis. These symptoms may include:  Bleeding.  Bruising.  Fever.  Night sweats.  Repeated infections.  Weakness.  Weight loss. DIAGNOSIS  Leukocytosis is often found during blood tests that are done as part of a normal physical exam. Your caregiver will probably order other tests to help determine why you have too many white blood cells. These tests may include:  A complete blood count (CBC). This test measures all the types of blood cells in your body.  Chest X-rays, urine tests (urinalysis), or other tests to look for signs of infection.  Bone marrow aspiration. For this test, a needle is put into your bone. Cells from the bone  marrow are removed through the needle. The cells are then examined under a microscope. TREATMENT  Treatment is usually not needed for leukocytosis. However, if a disorder is causing your leukocytosis, it will need to be treated. Treatment may include:  Antibiotic medicines if you have a bacterial infection.  Bone marrow transplant. Your diseased bone marrow is replaced with healthy cells that will grow new bone marrow.  Chemotherapy. This is the use of drugs to kill cancer cells. HOME CARE INSTRUCTIONS  Only take over-the-counter or prescription medicines as directed by your caregiver.  Maintain a healthy weight. Ask your caregiver what weight is best for you.  Eat foods that are low in saturated fats and high in fiber. Eat plenty of fruits and vegetables.  Drink enough fluids to keep your urine clear or pale yellow.  Get 30 minutes of exercise at least 5 times a week. Check with your caregiver before starting a new exercise routine.  Limit caffeine and alcohol.  Do not smoke.  Keep all follow-up appointments as directed by your caregiver. SEEK MEDICAL CARE IF:  You feel weak or more tired than usual.  You develop chills, a cough, or nasal congestion.  You lose weight without trying.  You have night sweats.  You bruise easily. SEEK IMMEDIATE MEDICAL CARE IF:  You bleed more than normal.  You have chest pain.  You have trouble breathing.  You have a fever.  You have uncontrolled nausea or vomiting.  You feel dizzy or lightheaded. MAKE SURE YOU:  Understand these instructions.  Will watch your condition.  Will get help right away if you are not doing well or get worse.   This information is not intended to replace advice given to you by your health care provider. Make sure you discuss any questions you have with your health care provider.   Document Released: 06/20/2011 Document Revised: 09/23/2011 Document Reviewed: 01/02/2015 Elsevier Interactive Patient  Education [employer].

## 2015-06-14 NOTE — Telephone Encounter (Signed)
[name] called, and states that the patient's temp has broke she is soaking wet. The bottom of her bag is watery stool and at the stoma it appears to have a formed look. Patient is refusing to go to the ED for evaluation. Will talk with Dr.Rehman and call [name] back.  Talked with Dr.Rehman and he feels that the patient should go to the ED. I have talked with the patient and she has agreed to go.

## 2015-06-14 NOTE — Telephone Encounter (Signed)
I have talked with [name]. She returned to work today and states that [name] is having hard chills and temp is 99.1. She is coughing up blood clots per [name], she had a CT of Chest on Monday ordered by her PCP, and is to return Monday of next week to followup on results. Patient is taking a pill for diarrhea but [name] says that her bag is full of watery stool. I will call PCP and speak with a nurse about this.  Spoke with the nurse. She states that Dr.Scott is booked well past 5 pm and doesn't feel that this should wait. He advises that she go to the nearest ED to be evaluated.  Patient, [name] was called and made aware. She states that she will try and get her to go.

## 2015-06-14 NOTE — ED Notes (Signed)
MD made aware of pt's O2 sats being in low 90's - Pt not verbalizing any symptoms or complaints. Instructed by MD to continue with discharge

## 2015-06-14 NOTE — Telephone Encounter (Addendum)
[name] CNA for [name] would like for you to call her  [name] has some things she says is important she would like to talk to you about.    [phone]

## 2015-06-19 LAB — CULTURE, BLOOD (ROUTINE X 2)
CULTURE: NO GROWTH
CULTURE: NO GROWTH

## 2015-06-21 ENCOUNTER — Ambulatory Visit (INDEPENDENT_AMBULATORY_CARE_PROVIDER_SITE_OTHER): Payer: Medicare Other | Admitting: Family Medicine

## 2015-06-21 ENCOUNTER — Encounter: Payer: Self-pay | Admitting: Family Medicine

## 2015-06-21 VITALS — BP 98/70 | Temp 98.1°F | Ht 59.0 in | Wt 81.0 lb

## 2015-06-21 DIAGNOSIS — R042 Hemoptysis: Secondary | ICD-10-CM | POA: Diagnosis not present

## 2015-06-21 DIAGNOSIS — J984 Other disorders of lung: Secondary | ICD-10-CM | POA: Diagnosis not present

## 2015-06-21 DIAGNOSIS — K668 Other specified disorders of peritoneum: Secondary | ICD-10-CM | POA: Diagnosis not present

## 2015-06-21 DIAGNOSIS — R634 Abnormal weight loss: Secondary | ICD-10-CM | POA: Diagnosis not present

## 2015-06-21 DIAGNOSIS — IMO0002 Reserved for concepts with insufficient information to code with codable children: Secondary | ICD-10-CM

## 2015-06-21 NOTE — Progress Notes (Signed)
   Subjective:    Patient ID: [name], female    DOB: 07-Feb-1929, 81 y.o.   MRN: [id number]  HPIFollow up on cyst on right side. Painful.  Patient relates at times it's tender and sore been present over the past month no known cause   Patient denies vomiting or diarrhea. She does relate no rash currently she does state she has intermittent hemoptysis. She states low appetite. Denies fevers at night but has had some intermittent fevers , she has seen infectious disease back in August who did not feel that the patient had TB  Mirtazapine was stopped but pt had it in her bubble pack. They are taking it out but pharm still sending it. Called [facility] and had med stopped.    patient at higher risk of complications and hospitalizations because of this cavitary lung lesion as well as weight loss and poor appetite.  Review of Systems  no vomiting or diarrhea denies abdominal pain relates a cyst near the stoma tender relates moderate coughing also hemoptysis    Objective:   Physical Exam   lungs are clear hearts regular extremities no edema skin warm dry cachexia noted extremities no edema what appears to be a freely movable cyst underneath the skin on the right side of her abdomen near the stoma does not appear to be cancerous      Assessment & Plan:   patient with a cyst on the abdominal wall minutes to ultrasound to confirm its assist I really don't feel patient surgical candidate unless it looks like it needs to come out or if it gets infected    cavitary lung disease was worked up by health department local pulmonologists as well as infectious disease not felt to be TB unlikely to be cancer patient though having trouble with hemoptysis intermittently as well as difficult time gaining weight and intermittent fevers I would recommend patient be seen by pulmonology for a second opinion regarding the cavitary lung disease in seeing if anything else needs to be done    depression patient does  suffer with some anxiety and depression stable currently   COPD uses her medicine on a regular basis denies any problems with this   hypothyroidism patient takes her medicine as directed. Recent lab work in November looks good.

## 2015-06-23 ENCOUNTER — Encounter: Payer: Self-pay | Admitting: Family Medicine

## 2015-06-28 ENCOUNTER — Ambulatory Visit (HOSPITAL_COMMUNITY)
Admission: RE | Admit: 2015-06-28 | Discharge: 2015-06-28 | Disposition: A | Discharge status: Home / Self Care | Payer: Medicare Other | Source: Ambulatory Visit | Attending: Family Medicine | Admitting: Family Medicine

## 2015-06-28 DIAGNOSIS — R1900 Intra-abdominal and pelvic swelling, mass and lump, unspecified site: Secondary | ICD-10-CM | POA: Insufficient documentation

## 2015-06-28 DIAGNOSIS — N2889 Other specified disorders of kidney and ureter: Secondary | ICD-10-CM | POA: Diagnosis not present

## 2015-06-28 DIAGNOSIS — K668 Other specified disorders of peritoneum: Secondary | ICD-10-CM | POA: Diagnosis not present

## 2015-07-05 ENCOUNTER — Other Ambulatory Visit: Payer: Self-pay | Admitting: Family Medicine

## 2015-07-07 ENCOUNTER — Telehealth: Payer: Self-pay | Admitting: Family Medicine

## 2015-07-07 NOTE — Telephone Encounter (Signed)
Patient aware Dr Nicki Reaper out of office today and we will call her with results 12/27

## 2015-07-07 NOTE — Telephone Encounter (Signed)
Pt is wanting to know the results to the ultrasound that she had done a couple weeks ago.

## 2015-07-11 ENCOUNTER — Other Ambulatory Visit: Payer: Self-pay | Admitting: *Deleted

## 2015-07-11 DIAGNOSIS — R229 Localized swelling, mass and lump, unspecified: Secondary | ICD-10-CM

## 2015-07-11 NOTE — Telephone Encounter (Signed)
The results of the tests shows a nodule that could be benign or could be a sign of cancer. What is recommended is a ultrasound-guided needle biopsy of this nodule. This could be done at the hospital as a outpatient procedure. More than likely the procedure would be in approximately 1-2 weeks. This procedure is very straightforward and would have minimal discomfort. It would be the best test to help [place] know for certain what is the source of this and whether or not we have to be concerned about it. If patient agree then I would recommend setting the patient up for this through [facility] radiology. If patient defers I would recommend she follow-up somewhere within the next month to discuss further

## 2015-07-11 NOTE — Telephone Encounter (Signed)
Order put in. [facility] to discuss with pt and to see when she can go

## 2015-07-12 NOTE — Telephone Encounter (Signed)
Discussed with pt. Pt already had appt scheduled for tomorrow and would like to discuss further tomorrow before scheduling. Orders have already been put in.

## 2015-07-13 ENCOUNTER — Encounter: Payer: Self-pay | Admitting: Family Medicine

## 2015-07-13 ENCOUNTER — Ambulatory Visit (INDEPENDENT_AMBULATORY_CARE_PROVIDER_SITE_OTHER): Payer: Medicare Other | Admitting: Family Medicine

## 2015-07-13 VITALS — BP 128/82 | Ht 59.0 in | Wt 78.6 lb

## 2015-07-13 DIAGNOSIS — J984 Other disorders of lung: Secondary | ICD-10-CM

## 2015-07-13 DIAGNOSIS — R042 Hemoptysis: Secondary | ICD-10-CM

## 2015-07-13 DIAGNOSIS — R634 Abnormal weight loss: Secondary | ICD-10-CM | POA: Diagnosis not present

## 2015-07-13 NOTE — Progress Notes (Signed)
   Subjective:    Patient ID: [name], female    DOB: 02-21-1929, 81 y.o.   MRN: [id number]  HPI  Patient arrives to recheck weigh and discuss recent tests results. Patient relates her appetite is fair tries to eat but sometimes does not have an appetite she relates weakness and fatigue. In addition to this she does relate cough she denies vomiting. She does relate hemoptysis almost every single day sometimes small amounts sometimes 1-2 tablespoons at a time. She denies night sweats She did see a local pulmonologist who referred her for possibility of TB. Initially she was treated with medication through the health department but sputum cultures were negative. Patient saw infectious disease through [facility] and was told she did not have TB She is had continued weight loss continued hemoptysis CT scan show some abnormal findings with cavitary lesions She is also had a recent visit where she had a nodule on the surface of the skin near her colostomy this nodule was ultrasound and was found to be with some calcifications. Needle biopsy was recommended. Multiple discussions held regarding her lung lesions as well as hemoptysis weight loss and the nodule on the surface near the colostomy. Greater than 25 minutes spent with the patient discussing these multiple issues. Review of Systems  Constitutional: Positive for fatigue. Negative for fever and appetite change.  HENT: Negative for congestion and rhinorrhea.   Respiratory: Positive for cough. Negative for shortness of breath, wheezing and stridor.   Cardiovascular: Negative for chest pain.  Gastrointestinal: Negative for abdominal pain and blood in stool.  Musculoskeletal: Positive for back pain. Negative for arthralgias.  Skin: Negative for rash.  Neurological: Negative for dizziness.  Psychiatric/Behavioral: Negative for behavioral problems.       Objective:   Physical Exam  Constitutional: She appears well-nourished. No distress.    Cardiovascular: Normal rate, regular rhythm and normal heart sounds.   No murmur heard. Pulmonary/Chest: Effort normal. No respiratory distress. She has rales.  Abdominal: Soft. She exhibits no distension.  Musculoskeletal: She exhibits no edema.  Lymphadenopathy:    She has no cervical adenopathy.  Neurological: She is alert. She exhibits normal muscle tone.  Psychiatric: Her behavior is normal.  Vitals reviewed.    25 minutes was spent with the patient. Greater than half the time was spent in discussion and answering questions and counseling regarding the issues that the patient came in for today.      Assessment & Plan:  Weight loss-I feel this is related to underlying pulmonary infection I believe that it is causing significant metabolic stress on the patient.  Abnormal CAT scan of chest ongoing hemoptysis-this patient has seen infectious disease that didn't testing that stated that they did not feel the patient had TB. Patient also saw a local pulmonology Dr. Luan Pulling who did not feel the patient had lung cancer. We are in the process of getting a second opinion regarding this patient's pulmonary issue. I believe with the progressive nature of her issue hemoptysis and weight loss along with her underlying COPD this is putting her at risk of significant secondary infection and possibility of death.   Patient does have a nodule on the surface of the abdomen near the colostomy site we will do needle biopsy of this area will set this up at the hospital.  We will rearrange patient's appointment with gastroenterology so that she can be able to attend all of her appointments.

## 2015-07-14 ENCOUNTER — Other Ambulatory Visit: Payer: Self-pay | Admitting: *Deleted

## 2015-07-14 ENCOUNTER — Encounter (HOSPITAL_COMMUNITY): Payer: Medicare Other

## 2015-07-14 ENCOUNTER — Encounter (HOSPITAL_COMMUNITY)
Admission: RE | Admit: 2015-07-14 | Discharge: 2015-07-14 | Disposition: A | Discharge status: Home / Self Care | Payer: Medicare Other | Source: Ambulatory Visit | Attending: Family Medicine | Admitting: Family Medicine

## 2015-07-14 DIAGNOSIS — M810 Age-related osteoporosis without current pathological fracture: Secondary | ICD-10-CM | POA: Insufficient documentation

## 2015-07-14 DIAGNOSIS — IMO0002 Reserved for concepts with insufficient information to code with codable children: Secondary | ICD-10-CM

## 2015-07-14 DIAGNOSIS — R229 Localized swelling, mass and lump, unspecified: Secondary | ICD-10-CM

## 2015-07-14 LAB — RENAL FUNCTION PANEL
ANION GAP: 8 (ref 5–15)
Albumin: 3.8 g/dL (ref 3.5–5.0)
BUN: 29 mg/dL — ABNORMAL HIGH (ref 6–20)
CHLORIDE: 107 mmol/L (ref 101–111)
CO2: 26 mmol/L (ref 22–32)
Calcium: 9.9 mg/dL (ref 8.9–10.3)
Creatinine, Ser: 1.07 mg/dL — ABNORMAL HIGH (ref 0.44–1.00)
GFR, EST AFRICAN AMERICAN: 54 mL/min — AB (ref >60)
GFR, EST NON AFRICAN AMERICAN: 46 mL/min — AB (ref >60)
Glucose, Bld: 150 mg/dL — ABNORMAL HIGH (ref 65–99)
POTASSIUM: 4.2 mmol/L (ref 3.5–5.1)
Phosphorus: 3 mg/dL (ref 2.5–4.6)
Sodium: 141 mmol/L (ref 135–145)

## 2015-07-14 MED ORDER — IBANDRONATE SODIUM 3 MG/3ML IV SOLN
3.0000 mg | Freq: Once | INTRAVENOUS | Status: AC
  Administered 2015-07-14: 3 mg via INTRAVENOUS

## 2015-07-14 MED ORDER — IBANDRONATE SODIUM 3 MG/3ML IV SOLN
INTRAVENOUS | Status: AC
  Filled 2015-07-14: qty 3

## 2015-07-14 NOTE — Progress Notes (Signed)
okay

## 2015-07-14 NOTE — Progress Notes (Signed)
Test scheduled aph jan4th at 2pm. Pt notified of appt.

## 2015-07-19 ENCOUNTER — Telehealth: Payer: Self-pay

## 2015-07-19 ENCOUNTER — Ambulatory Visit (HOSPITAL_COMMUNITY): Admission: RE | Admit: 2015-07-19 | Payer: Medicare Other | Source: Ambulatory Visit

## 2015-07-19 MED ORDER — FLUTICASONE PROPIONATE 50 MCG/ACT NA SUSP: 2.0000 | Freq: Every day | NASAL | Status: DC

## 2015-07-19 NOTE — Telephone Encounter (Signed)
Dr. Nicki Reaper requested to talk to patient. Patient was reached.

## 2015-07-19 NOTE — Telephone Encounter (Signed)
Notified patient flonase sent to pharmacy and schedule follow up visit in 2-4 weeks after consultation with Dr. Shyrl Numbers. Patient verbalized understanding.

## 2015-07-19 NOTE — Telephone Encounter (Signed)
I did speak with radiology. They state that the area that was present that we are talking about doing a biopsy on was the same size as it was a couple years ago they think the likelihood of any type of serious issue is minimal to none therefore hold off on biopsy of this issue this was discussed with the patient. In addition to this patient does relate having frequent runny nose nurses please do 2 things #1 Flonase 2 sprays each nostril, 01 canister with 3 refills #2 patient needs follow-up office visit in approximately 2-4 weeks make sure that that follow-up office visit is after consultation with Dr. Judi Saa she Arty has scheduled

## 2015-07-20 NOTE — Progress Notes (Signed)
[name]'s appt has been resch'd to 08/01/15 at 400, she is aware. Thanks

## 2015-07-25 ENCOUNTER — Ambulatory Visit (INDEPENDENT_AMBULATORY_CARE_PROVIDER_SITE_OTHER)
Admission: RE | Admit: 2015-07-25 | Discharge: 2015-07-25 | Disposition: A | Discharge status: Home / Self Care | Payer: Medicare Other | Source: Ambulatory Visit | Attending: Internal Medicine | Admitting: Internal Medicine

## 2015-07-25 ENCOUNTER — Encounter: Payer: Self-pay | Admitting: Internal Medicine

## 2015-07-25 ENCOUNTER — Ambulatory Visit (INDEPENDENT_AMBULATORY_CARE_PROVIDER_SITE_OTHER): Payer: Medicare Other | Admitting: Internal Medicine

## 2015-07-25 VITALS — BP 128/80 | HR 67 | Ht 59.0 in | Wt 78.8 lb

## 2015-07-25 DIAGNOSIS — R042 Hemoptysis: Secondary | ICD-10-CM

## 2015-07-25 DIAGNOSIS — R0600 Dyspnea, unspecified: Secondary | ICD-10-CM | POA: Diagnosis not present

## 2015-07-25 DIAGNOSIS — R634 Abnormal weight loss: Secondary | ICD-10-CM | POA: Diagnosis not present

## 2015-07-25 DIAGNOSIS — J984 Other disorders of lung: Secondary | ICD-10-CM | POA: Diagnosis not present

## 2015-07-25 NOTE — Progress Notes (Signed)
Subjective:    Patient ID: [name], female    DOB: 1929-04-07,    MRN: [id number]  HPI  29 yowf never regular smoker with chronic fibrocavitary lung dz prev rx for TB by Dr Annamaria Boots around 1995 referred to pulmonary clinic 07/25/2015  by Dr [name] for hemoptysis eval s/p neg ID w/u by Dr Tommy Medal 02/2015 with 3 neg sputa for TB   07/25/2015 1st [facility] office visit/ Lyam Provencio   Chief Complaint  Patient presents with  . Pulmonary Consult    Referred by [name]. Pt c/o "spitting up blood"- started 3 months ago, occurs every am. She has not had much appetite and is losing wt.   Nose bleed x one year resolved ([place])  3  month prior to OV noted variably present streaks of BR Blood in mucus / assoc with continued wt loss which she noted as long as one year prior to OV    Notes blood mostly just  in ams with minimal total mucus which is mostly white Also note doe x 3 m > MMRC2 = can't walk a nl pace on a flat grade s sob    No obvious other patterns in day to day or daytime variabilty or assoc sob  or cp or chest tightness, subjective wheeze overt sinus or hb symptoms. No unusual exp hx or h/o childhood pna/ asthma or knowledge of premature birth.  Sleeping ok without nocturnal  or early am exacerbation  of respiratory  c/o's or need for noct saba. Also denies any obvious fluctuation of symptoms with weather or environmental changes or other aggravating or alleviating factors except as outlined above   Current Medications, Allergies, Complete Past Medical History, Past Surgical History, Family History, and Social History were reviewed in [employer] record.               Review of Systems  Constitutional: Positive for unexpected weight change. Negative for fever and chills.  HENT: Positive for nosebleeds. Negative for congestion, dental problem, ear pain, postnasal drip, rhinorrhea, sinus pressure, sneezing, sore throat, trouble swallowing and voice  change.   Eyes: Negative for visual disturbance.  Respiratory: Positive for cough and shortness of breath. Negative for choking.   Cardiovascular: Negative for chest pain and leg swelling.  Gastrointestinal: Negative for vomiting, abdominal pain and diarrhea.  Genitourinary: Negative for difficulty urinating.  Musculoskeletal: Negative for arthralgias.  Skin: Negative for rash.  Neurological: Positive for headaches. Negative for tremors and syncope.  Hematological: Does not bruise/bleed easily.       Objective:   Physical Exam   Thin frail elderly wf nad  Wt Readings from Last 3 Encounters:  07/25/15 78 lb 12.8 oz (35.743 kg)  07/13/15 78 lb 9.6 oz (35.653 kg)  06/21/15 81 lb (36.741 kg)  06/20/14           82   Vital signs reviewed    HEENT: nl dentition, turbinates, and oropharynx. Nl external ear canals without cough reflex   NECK :  without JVD/Nodes/TM/ nl carotid upstrokes bilaterally   LUNGS: no acc muscle use,  slt pidgeon chest/ min insp and exp rhonchi bilaterally    CV:  RRR  no s3 or murmur or increase in P2, no edema   ABD:  soft and nontender with nl inspiratory excursion in the supine position. No bruits or organomegaly, bowel sounds nl  MS:  Nl gait/ ext warm without deformities, calf tenderness, cyanosis or clubbing No  obvious joint restrictions   SKIN: warm and dry without lesions    NEURO:  alert, approp, nl sensorium with  no motor deficits     CXR PA and Lateral:   07/25/2015 :    I personally reviewed images and agree with radiology impression as follows:     Severe chronic lung disease with scarring and fibrotic change as well as previously demonstrated cavitary lesions. My impression: The area of concern in RUL post segment is much less dense than corresponding area from [facility] in 2015       Assessment & Plan:

## 2015-07-25 NOTE — Patient Instructions (Addendum)
Please remember to go to the x-ray department downstairs for your tests - we will call you with the results when they are available.  Please schedule a follow up visit in 3 months but call sooner if needed  Add Levaquin 500 mg daily x 10 days

## 2015-07-26 MED ORDER — LEVOFLOXACIN 500 MG PO TABS: 500.0000 mg | ORAL_TABLET | Freq: Every day | ORAL | Status: DC

## 2015-07-26 NOTE — Assessment & Plan Note (Signed)
07/25/2015   Walked RA x one lap @ 185 stopped due to  sats 88% slow pace but not sob

## 2015-07-26 NOTE — Assessment & Plan Note (Addendum)
Low grade c/w bronchiectasis assoc with prior TB and suggests tendency to superinfection with gnr or atypical tb though several areas are potential mycetomas on ct which if present may cause massive bleeding and would be amenable to IR intervention if we could localize the source but clearly not a candidate for aggressive surgery or empirical sporonox and strongly doubt active TB (agree with Dr Lucianne Lei Dam's thoughts on this)   rec levquin 500 x 10 days and consider fob to sample  airways if persists after levaquin  I had an extended discussion with the patient reviewing all relevant studies/ serial cxr's and prev ID eval  and  lasting 35  minutes of a 60 minute visit    Discussed in detail all the  indications, usual  risks and alternatives  relative to the benefits with patient who agrees to proceed with conservative f/u as outlined    Each maintenance medication was reviewed in detail including most importantly the difference between maintenance and prns and under what circumstances the prns are to be triggered using an action plan format that is not reflected in the computer generated alphabetically organized AVS.    Please see instructions for details which were reviewed in writing and the patient given a copy highlighting the part that I personally wrote and discussed at today's ov.

## 2015-07-26 NOTE — Assessment & Plan Note (Addendum)
Only a few pounds down  x one year though needs to be monitored for other causes of unexplained wt loss (if def down trend continues) as cxr/ct changes are not very impressive.

## 2015-07-31 ENCOUNTER — Ambulatory Visit: Payer: Medicare Other | Admitting: Family Medicine

## 2015-08-01 ENCOUNTER — Ambulatory Visit (INDEPENDENT_AMBULATORY_CARE_PROVIDER_SITE_OTHER): Payer: Medicare Other | Admitting: Internal Medicine

## 2015-08-01 ENCOUNTER — Encounter (INDEPENDENT_AMBULATORY_CARE_PROVIDER_SITE_OTHER): Payer: Self-pay | Admitting: Internal Medicine

## 2015-08-01 VITALS — BP 88/72 | HR 65 | Temp 97.7°F | Resp 16 | Ht 59.0 in | Wt 78.1 lb

## 2015-08-01 DIAGNOSIS — R197 Diarrhea, unspecified: Secondary | ICD-10-CM | POA: Diagnosis not present

## 2015-08-01 DIAGNOSIS — R634 Abnormal weight loss: Secondary | ICD-10-CM | POA: Diagnosis not present

## 2015-08-01 NOTE — Patient Instructions (Signed)
Stool volume outputs for 24 hours 3 days. Patient will call with results of blood tests when completed.

## 2015-08-01 NOTE — Progress Notes (Signed)
Presenting complaint;  Diarrhea and weight loss.  Subjective:  Patient is 82 year old Caucasian female who is here for scheduled visit and she is accompanied by her sister Suanne Marker. She continues to complain of diarrhea. She states she passes large amount of liquid stool. She states she had so much stool yesterday but she had to cancel her appointment with Dr. Wolfgang Phoenix. At times she has noted her stool to be like coffee-ground. She has not seen any bright red blood in ileostomy bag. She continues to spit up blood. She was seen by Dr. Melvyn Novas of pulmonary service who has recommended Levaquin. She states she has good appetite but still losing weight. She has lost 3 pounds since her last visit 3 months ago. She tried liquid Imodium but it did not decreased stool output.  Over 3 months ago she had stool studies and she had high a small tic gap. She states she did not see any improvement with Xifaxan as subsequently with pancreatic enzyme supplement.    Current Medications: Outpatient Encounter Prescriptions as of 08/01/2015  Medication Sig  . ATROVENT HFA 17 MCG/ACT inhaler 2 PUFFS FOUR TIMES DAILY. (Patient taking differently: TAKE TWO PUFFS VIA INHALATION TWICE DAILY AS NEEDED)  . beta carotene w/minerals (OCUVITE) tablet Take 1 tablet by mouth daily.  . citalopram (CELEXA) 10 MG tablet TAKE (1) TABLET BY MOUTH ONCE DAILY.  . diazepam (VALIUM) 5 MG tablet TAKE 1/2 TABLET DURING THE DAY AS NEEDED AND 1 TABLET AT BEDTIME.  . fluticasone (FLONASE) 50 MCG/ACT nasal spray Place 2 sprays into both nostrils daily.  Marland Kitchen levothyroxine (SYNTHROID, LEVOTHROID) 25 MCG tablet TAKE 2 TABLETS ON MONDAY AND FRIDAY.  . memantine (NAMENDA) 10 MG tablet TAKE 1 TABLET TWICE A DAY FOR DEMENTIA.  Marland Kitchen metoprolol tartrate (LOPRESSOR) 25 MG tablet TAKE 1/2 TABLET TWICE DAILY FOR HIGH BLOOD PRESSURE.  . Multiple Vitamin (MULTIVITAMIN) tablet Take 1 tablet by mouth daily.  . pantoprazole (PROTONIX) 40 MG tablet TAKE ONE TABLET  DAILY FOR ACID REFLUX.  Marland Kitchen potassium chloride (K-DUR) 10 MEQ tablet TAKE (1) TABLET BY MOUTH ONCE DAILY.  Marland Kitchen topiramate (TOPAMAX) 25 MG tablet Take 1 tablet (25 mg total) by mouth daily.  Marland Kitchen venlafaxine XR (EFFEXOR-XR) 75 MG 24 hr capsule TAKE 1 CAPSULE ONCE DAILY WITH BREAKFAST.  Marland Kitchen Pancrelipase, Lip-Prot-Amyl, 24000 UNITS CPEP Take 2 capsules (48,000 Units total) by mouth 3 (three) times daily with meals. (Patient not taking: Reported on 08/01/2015)  . [DISCONTINUED] levofloxacin (LEVAQUIN) 500 MG tablet Take 1 tablet (500 mg total) by mouth daily. (Patient not taking: Reported on 08/01/2015)   No facility-administered encounter medications on file as of 08/01/2015.     Objective: Blood pressure 88/72, pulse 65, temperature 97.7 F (36.5 C), temperature source Oral, resp. rate 16, height '4\' 11"'$  (1.499 m), weight 78 lb 1.6 oz (35.426 kg). Patient is alert in no acute distress. She is thin with generalized wasting. Conjunctiva is pink. Sclera is nonicteric Oropharyngeal mucosa is normal. No neck masses or thyromegaly noted. Cardiac exam with regular rhythm normal S1 and S2. No murmur or gallop noted. Auscultation of lungs revealed coarse rhonchi at left base. Abdomen is flat. Ileostomy is located in right low quadrant and ileostomy bag has very scant amount of light brown pasty stool. Abdomen is soft and nontender without organomegaly or masses. No LE edema or clubbing noted.  Labs/studies Results: Lab data from 06/14/2015  WBC 21.0, H&H 14.2 and 43.3 and platelet count 272K  Serum sodium 138, potassium 3.8, chloride 100,  CO2 29, glucose 91, BUN 20, creatinine 1.07  Serum calcium 9.4.  Bilirubin 0.9, AP 146, AST 31, ALT 15, total protein 8.1 and albumin 3.9.    Assessment:  #1. Diarrhea. Stool output seemed to have increased. Over 3 months ago she was noted to have a high stool or spotting gap but she did not respond to therapy with antibiotic pancreatic enzyme supplements. If she has high  stool output would consider therapy with octreotide presuming that she may have secretory diarrhea. Heme positive stool is possibly due to swallowed blood. H&H over 2 months ago was normal and will be repeated. She may need EGD with duodenal biopsy as well as ileoscopy. #2. Weight loss. Weight loss has been gradual over to by mouth of over 4 years. In the last 3 months she is only lost 3 pounds.   Plan:  Patient will go to the lab for CBC with differential and sedimentation rate. She will do stool volume for 24 hours 3 days in a row and call with result.

## 2015-08-02 ENCOUNTER — Other Ambulatory Visit: Payer: Self-pay | Admitting: Family Medicine

## 2015-08-02 NOTE — Telephone Encounter (Signed)
May we refill Citalopram?

## 2015-08-02 NOTE — Telephone Encounter (Signed)
Heme positive stool is possibly due to swallowed blood.. Discussed with patient at the time of office visit yesterday.

## 2015-08-02 NOTE — Telephone Encounter (Signed)
It is okay to give one years worth of refills. Please be aware there are multiple duplicate prescriptions in this request thank you

## 2015-08-03 ENCOUNTER — Other Ambulatory Visit: Payer: Self-pay | Admitting: *Deleted

## 2015-08-03 LAB — CBC WITH DIFFERENTIAL/PLATELET
Basophils Absolute: 0.1 10*3/uL (ref 0.0–0.1)
Basophils Relative: 1 % (ref 0–1)
Eosinophils Absolute: 0.5 10*3/uL (ref 0.0–0.7)
Eosinophils Relative: 5 % (ref 0–5)
HCT: 42.1 % (ref 36.0–46.0)
HEMOGLOBIN: 13.2 g/dL (ref 12.0–15.0)
LYMPHS ABS: 3.6 10*3/uL (ref 0.7–4.0)
LYMPHS PCT: 35 % (ref 12–46)
MCH: 31.2 pg (ref 26.0–34.0)
MCHC: 31.4 g/dL (ref 30.0–36.0)
MCV: 99.5 fL (ref 78.0–100.0)
MONOS PCT: 8 % (ref 3–12)
MPV: 10.9 fL (ref 8.6–12.4)
Monocytes Absolute: 0.8 10*3/uL (ref 0.1–1.0)
NEUTROS ABS: 5.3 10*3/uL (ref 1.7–7.7)
NEUTROS PCT: 51 % (ref 43–77)
Platelets: 318 10*3/uL (ref 150–400)
RBC: 4.23 MIL/uL (ref 3.87–5.11)
RDW: 15.6 % — ABNORMAL HIGH (ref 11.5–15.5)
WBC: 10.4 10*3/uL (ref 4.0–10.5)

## 2015-08-03 MED ORDER — METOPROLOL TARTRATE 25 MG PO TABS: ORAL_TABLET | ORAL | Status: DC

## 2015-08-04 LAB — SEDIMENTATION RATE: SED RATE: 8 mm/h (ref 0–30)

## 2015-08-08 ENCOUNTER — Telehealth (INDEPENDENT_AMBULATORY_CARE_PROVIDER_SITE_OTHER): Payer: Self-pay | Admitting: Internal Medicine

## 2015-08-08 NOTE — Telephone Encounter (Signed)
Stool output is as follows 600 mL on day 1 900 mL on day 2 500 mL on days 3  Stool volume not consistent with secretory diarrhea. Will proceed with upper GI with small bowel follow-through before endoscopic evaluation.

## 2015-08-09 ENCOUNTER — Other Ambulatory Visit (INDEPENDENT_AMBULATORY_CARE_PROVIDER_SITE_OTHER): Payer: Self-pay | Admitting: Internal Medicine

## 2015-08-09 DIAGNOSIS — R197 Diarrhea, unspecified: Secondary | ICD-10-CM

## 2015-08-09 DIAGNOSIS — R634 Abnormal weight loss: Secondary | ICD-10-CM

## 2015-08-09 DIAGNOSIS — R140 Abdominal distension (gaseous): Secondary | ICD-10-CM

## 2015-08-09 NOTE — Telephone Encounter (Signed)
UGI w/ SBFT sch'd 08/14/15 at 915, npo after midnight

## 2015-08-11 ENCOUNTER — Ambulatory Visit: Payer: Medicare Other | Admitting: Family Medicine

## 2015-08-14 ENCOUNTER — Other Ambulatory Visit (INDEPENDENT_AMBULATORY_CARE_PROVIDER_SITE_OTHER): Payer: Self-pay | Admitting: Internal Medicine

## 2015-08-14 ENCOUNTER — Ambulatory Visit (HOSPITAL_COMMUNITY)
Admission: RE | Admit: 2015-08-14 | Discharge: 2015-08-14 | Disposition: A | Discharge status: Home / Self Care | Payer: Medicare Other | Source: Ambulatory Visit | Attending: Internal Medicine | Admitting: Internal Medicine

## 2015-08-14 DIAGNOSIS — K589 Irritable bowel syndrome without diarrhea: Secondary | ICD-10-CM | POA: Insufficient documentation

## 2015-08-14 DIAGNOSIS — R634 Abnormal weight loss: Secondary | ICD-10-CM | POA: Diagnosis not present

## 2015-08-14 DIAGNOSIS — R197 Diarrhea, unspecified: Secondary | ICD-10-CM | POA: Diagnosis not present

## 2015-08-14 DIAGNOSIS — K224 Dyskinesia of esophagus: Secondary | ICD-10-CM | POA: Diagnosis not present

## 2015-08-14 DIAGNOSIS — Z903 Acquired absence of stomach [part of]: Secondary | ICD-10-CM | POA: Diagnosis not present

## 2015-08-14 DIAGNOSIS — J449 Chronic obstructive pulmonary disease, unspecified: Secondary | ICD-10-CM | POA: Insufficient documentation

## 2015-08-14 DIAGNOSIS — R042 Hemoptysis: Secondary | ICD-10-CM | POA: Diagnosis not present

## 2015-08-14 DIAGNOSIS — R140 Abdominal distension (gaseous): Secondary | ICD-10-CM | POA: Diagnosis not present

## 2015-08-21 ENCOUNTER — Ambulatory Visit (INDEPENDENT_AMBULATORY_CARE_PROVIDER_SITE_OTHER): Payer: Medicare Other | Admitting: Family Medicine

## 2015-08-21 ENCOUNTER — Encounter: Payer: Self-pay | Admitting: Family Medicine

## 2015-08-21 ENCOUNTER — Other Ambulatory Visit (INDEPENDENT_AMBULATORY_CARE_PROVIDER_SITE_OTHER): Payer: Self-pay | Admitting: *Deleted

## 2015-08-21 VITALS — BP 100/68 | Ht 59.0 in | Wt 79.0 lb

## 2015-08-21 DIAGNOSIS — E038 Other specified hypothyroidism: Secondary | ICD-10-CM

## 2015-08-21 DIAGNOSIS — R634 Abnormal weight loss: Secondary | ICD-10-CM

## 2015-08-21 DIAGNOSIS — R197 Diarrhea, unspecified: Secondary | ICD-10-CM

## 2015-08-21 DIAGNOSIS — R350 Frequency of micturition: Secondary | ICD-10-CM | POA: Diagnosis not present

## 2015-08-21 DIAGNOSIS — R042 Hemoptysis: Secondary | ICD-10-CM

## 2015-08-21 DIAGNOSIS — J984 Other disorders of lung: Secondary | ICD-10-CM | POA: Diagnosis not present

## 2015-08-21 DIAGNOSIS — R140 Abdominal distension (gaseous): Secondary | ICD-10-CM

## 2015-08-21 LAB — POCT URINALYSIS DIPSTICK
PH UA: 5
Spec Grav, UA: 1.02

## 2015-08-21 NOTE — Progress Notes (Signed)
   Subjective:    Patient ID: [name], female    DOB: 05-24-1929, 82 y.o.   MRN: [id number]  HPI Patient is here today for a follow up visit on weight loss and hemoptysis. Patient states that she is having urinary frequency. She would like a urine sample checked today.  She relates she is trying to best to eat properly she finds it difficult to keep her weight on. She is seeing gastroenterology and pulmonology recently  Review of Systems She denies any high fever chills sweats nausea vomiting diarrhea.    Objective:   Physical Exam  Lungs are clear hearts regular neck no masses abdomen soft extremities no edema skin warm dry The specialist did not feel she had TB.     Assessment & Plan:  Urine is negative for any type of infection patient I don't feel needs to be on any antibiotic currently  Slight weight loss related to her gastroenterology issues and aging factors. No x-rays lab work indicated keep follow-up with gastroenterology she is doing best he can at getting good adequate dietary calories  Patient up-to-date on her standard lab work I would not recommend repeating that currently

## 2015-08-23 ENCOUNTER — Other Ambulatory Visit: Payer: Self-pay | Admitting: *Deleted

## 2015-08-23 NOTE — Progress Notes (Signed)
May have this and 4 refills

## 2015-08-24 MED ORDER — DIAZEPAM 5 MG PO TABS: ORAL_TABLET | ORAL | Status: DC

## 2015-08-25 ENCOUNTER — Encounter (HOSPITAL_COMMUNITY)
Admission: RE | Disposition: A | Discharge status: Home / Self Care | Payer: Self-pay | Source: Ambulatory Visit | Attending: Internal Medicine

## 2015-08-25 ENCOUNTER — Encounter (HOSPITAL_COMMUNITY): Payer: Self-pay | Admitting: *Deleted

## 2015-08-25 ENCOUNTER — Ambulatory Visit (HOSPITAL_COMMUNITY)
Admission: RE | Admit: 2015-08-25 | Discharge: 2015-08-25 | Disposition: A | Discharge status: Home / Self Care | Payer: Medicare Other | Source: Ambulatory Visit | Attending: Internal Medicine | Admitting: Internal Medicine

## 2015-08-25 DIAGNOSIS — Z932 Ileostomy status: Secondary | ICD-10-CM | POA: Diagnosis not present

## 2015-08-25 DIAGNOSIS — R197 Diarrhea, unspecified: Secondary | ICD-10-CM | POA: Diagnosis not present

## 2015-08-25 DIAGNOSIS — R634 Abnormal weight loss: Secondary | ICD-10-CM | POA: Diagnosis not present

## 2015-08-25 DIAGNOSIS — R938 Abnormal findings on diagnostic imaging of other specified body structures: Secondary | ICD-10-CM

## 2015-08-25 DIAGNOSIS — F411 Generalized anxiety disorder: Secondary | ICD-10-CM | POA: Insufficient documentation

## 2015-08-25 DIAGNOSIS — J449 Chronic obstructive pulmonary disease, unspecified: Secondary | ICD-10-CM | POA: Insufficient documentation

## 2015-08-25 DIAGNOSIS — Z903 Acquired absence of stomach [part of]: Secondary | ICD-10-CM | POA: Insufficient documentation

## 2015-08-25 DIAGNOSIS — Z8711 Personal history of peptic ulcer disease: Secondary | ICD-10-CM | POA: Diagnosis not present

## 2015-08-25 DIAGNOSIS — K3189 Other diseases of stomach and duodenum: Secondary | ICD-10-CM | POA: Diagnosis not present

## 2015-08-25 DIAGNOSIS — K210 Gastro-esophageal reflux disease with esophagitis: Secondary | ICD-10-CM | POA: Diagnosis not present

## 2015-08-25 DIAGNOSIS — R110 Nausea: Secondary | ICD-10-CM | POA: Insufficient documentation

## 2015-08-25 DIAGNOSIS — R042 Hemoptysis: Secondary | ICD-10-CM

## 2015-08-25 DIAGNOSIS — R140 Abdominal distension (gaseous): Secondary | ICD-10-CM | POA: Diagnosis not present

## 2015-08-25 DIAGNOSIS — K449 Diaphragmatic hernia without obstruction or gangrene: Secondary | ICD-10-CM | POA: Insufficient documentation

## 2015-08-25 DIAGNOSIS — Z79899 Other long term (current) drug therapy: Secondary | ICD-10-CM | POA: Insufficient documentation

## 2015-08-25 DIAGNOSIS — K921 Melena: Secondary | ICD-10-CM | POA: Insufficient documentation

## 2015-08-25 DIAGNOSIS — R195 Other fecal abnormalities: Secondary | ICD-10-CM | POA: Insufficient documentation

## 2015-08-25 HISTORY — PX: ESOPHAGOGASTRODUODENOSCOPY: SHX5428

## 2015-08-25 HISTORY — PX: ILEOSCOPY: SHX5434

## 2015-08-25 SURGERY — EGD (ESOPHAGOGASTRODUODENOSCOPY)
Anesthesia: Moderate Sedation

## 2015-08-25 MED ORDER — STERILE WATER FOR IRRIGATION IR SOLN
Status: DC | PRN
  Administered 2015-08-25: 08:00:00

## 2015-08-25 MED ORDER — SIMETHICONE 40 MG/0.6ML PO SUSP
ORAL | Status: AC
  Filled 2015-08-25: qty 30

## 2015-08-25 MED ORDER — MIDAZOLAM HCL 5 MG/5ML IJ SOLN
INTRAMUSCULAR | Status: AC
  Filled 2015-08-25: qty 10

## 2015-08-25 MED ORDER — MEPERIDINE HCL 50 MG/ML IJ SOLN
INTRAMUSCULAR | Status: DC | PRN
Start: 2015-08-25 — End: 2015-08-25
  Administered 2015-08-25: 15 mg via INTRAVENOUS
  Administered 2015-08-25: 25 mg via INTRAVENOUS

## 2015-08-25 MED ORDER — MIDAZOLAM HCL 5 MG/5ML IJ SOLN
INTRAMUSCULAR | Status: DC | PRN
  Administered 2015-08-25 (×2): 1 mg via INTRAVENOUS

## 2015-08-25 MED ORDER — BUTAMBEN-TETRACAINE-BENZOCAINE 2-2-14 % EX AERO
INHALATION_SPRAY | CUTANEOUS | Status: DC | PRN
  Administered 2015-08-25: 2 via TOPICAL

## 2015-08-25 MED ORDER — MEPERIDINE HCL 50 MG/ML IJ SOLN
INTRAMUSCULAR | Status: AC
  Filled 2015-08-25: qty 1

## 2015-08-25 MED ORDER — SODIUM CHLORIDE 0.9 % IV SOLN
INTRAVENOUS | Status: DC
  Administered 2015-08-25: 07:00:00 via INTRAVENOUS

## 2015-08-25 NOTE — H&P (Signed)
[name] is an 82 y.o. female.   Chief Complaint: Patient is here for EGD and ileoscopy. HPI: Patient's 82 year old Caucasian female with multiple medical problems who has intermittent diarrhea and history of melena. She has poor appetite intermittent nausea and is slowly losing weight. She had upper GI with small bowel follow-through recently which revealed narrowing at gastrojejunostomy. She has history of partial gastrectomy for peptic ulcer disease years ago. Her diarrhea has not responded to empiric therapy with antibiotics as well as antidiarrheals.  Past Medical History  Diagnosis Date  . Lower abdominal pain   . Gastroesophageal reflux disease   . IBS (irritable bowel syndrome)   . Migraines   . GAD (generalized anxiety disorder)   . Anemia   . Osteoporosis   . Tachycardia   . Chronic abdominal pain   . Gastroparesis   . COPD (chronic obstructive pulmonary disease) ([place])   . Lung mass   . Chronic neck and back pain   . 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 Renal insufficiency   . S/P colostomy [facility])     Past Surgical History  Procedure Laterality Date  . Stomach surgery for a bleeding ulcer when she was in her 35s    . Gastrectomy    . Tonsillectomy    . Appendectomy    . Cholecystectomy    . Abdominal hysterectomy    . Esophagogastroduodenoscopy      Family History  Problem Relation Age of Onset  . Stroke Mother   . Stroke Father   . Diabetes Sister   . Diabetes Brother   . Stroke Brother   . Cancer Sister     unknown type  . Diabetes Sister   . Emphysema Sister   . Diabetes Brother   . Stroke Brother   . Diabetes Son   . Irritable bowel syndrome Son   . Diabetes Son   . Hypertension Son    Social History:  reports that she has never smoked. She has never used smokeless tobacco. She reports that she does not drink alcohol or use illicit drugs.  Allergies:  Allergies  Allergen Reactions  . Levaquin [Levofloxacin] Other  (See Comments)    Patient states that she became weak and extremely tired. Also, it made her real dizzy.  . Tetanus Toxoid Hives and Swelling  . Zolpidem Tartrate     Not in right state of mind  . Fosamax [Alendronate Sodium] Other (See Comments)    Severe gastritis reflux.    Medications Prior to Admission  Medication Sig Dispense Refill  . ATROVENT HFA 17 MCG/ACT inhaler 2 PUFFS FOUR TIMES DAILY. (Patient taking differently: TAKE TWO PUFFS VIA INHALATION TWICE DAILY AS NEEDED) 12.9 g 4  . beta carotene w/minerals (OCUVITE) tablet Take 1 tablet by mouth daily.    . diazepam (VALIUM) 5 MG tablet TAKE 1/2 TABLET DURING THE DAY AS NEEDED AND 1 TABLET AT BEDTIME. 45 tablet 4  . fluticasone (FLONASE) 50 MCG/ACT nasal spray Place 2 sprays into both nostrils daily. 16 g 3  . levothyroxine (SYNTHROID, LEVOTHROID) 25 MCG tablet TAKE 2 TABLETS ON MONDAY AND FRIDAY. 38 tablet 0  . memantine (NAMENDA) 10 MG tablet TAKE 1 TABLET TWICE A DAY FOR DEMENTIA. 60 tablet 5  . metoprolol tartrate (LOPRESSOR) 25 MG tablet TAKE 1/2 TABLET TWICE DAILY FOR HIGH BLOOD PRESSURE. 30 tablet 0  . Multiple Vitamin (MULTIVITAMIN) tablet Take 1 tablet by mouth daily.    . pantoprazole (  PROTONIX) 40 MG tablet TAKE ONE TABLET DAILY FOR ACID REFLUX. 30 tablet 5  . potassium chloride (K-DUR) 10 MEQ tablet TAKE (1) TABLET BY MOUTH ONCE DAILY. 30 tablet 0  . topiramate (TOPAMAX) 25 MG tablet Take 1 tablet (25 mg total) by mouth daily. 30 tablet 5  . venlafaxine XR (EFFEXOR-XR) 75 MG 24 hr capsule TAKE 1 CAPSULE ONCE DAILY WITH BREAKFAST. 30 capsule 5    No results found for this or any previous visit (from the past 48 hour(s)). No results found.  ROS  Blood pressure 193/87, pulse 76, temperature 97.9 F (36.6 C), temperature source Oral, resp. rate 19, height '4\' 11"'$  (1.499 m), weight 79 lb (35.834 kg), SpO2 100 %. Physical Exam  Constitutional:  Well-developed thin Caucasian female in NAD.  HENT:  Mouth/Throat:  Oropharynx is clear and moist.  Eyes: Conjunctivae are normal. No scleral icterus.  Neck: No thyromegaly present.  Cardiovascular: Normal rate, regular rhythm and normal heart sounds.   No murmur heard. Respiratory: Effort normal and breath sounds normal.  GI:  Abdomen is flat with ileostomy in right low quadrant. It is soft and nontender.  Musculoskeletal: She exhibits no edema.  Lymphadenopathy:    She has no cervical adenopathy.  Neurological: She is alert.  Skin: Skin is warm and dry.     Assessment/Plan Weight loss nausea diarrhea history of melena. Heme positive stool. Abnormal upper GI series suggesting stricture at gastrojejunostomy. EGD and ileoscopy.  Rogene Houston, MD 08/25/2015, 7:35 AM

## 2015-08-25 NOTE — Discharge Instructions (Signed)
Resume usual medications and diet. No driving for 24 hours. Physician will call with biopsy results.Colonoscopy, Care After Refer to this sheet in the next few weeks. These instructions provide you with information on caring for yourself after your procedure. Your health care provider may also give you more specific instructions. Your treatment has been planned according to current medical practices, but problems sometimes occur. Call your health care provider if you have any problems or questions after your procedure. WHAT TO EXPECT AFTER THE PROCEDURE  After your procedure, it is typical to have the following:  A small amount of blood in your stool.  Moderate amounts of gas and mild abdominal cramping or bloating. HOME CARE INSTRUCTIONS  Do not drive, operate machinery, or sign important documents for 24 hours.  You may shower and resume your regular physical activities, but move at a slower pace for the first 24 hours.  Take frequent rest periods for the first 24 hours.  Walk around or put a warm pack on your abdomen to help reduce abdominal cramping and bloating.  Drink enough fluids to keep your urine clear or pale yellow.  You may resume your normal diet as instructed by your health care provider. Avoid heavy or fried foods that are hard to digest.  Avoid drinking alcohol for 24 hours or as instructed by your health care provider.  Only take over-the-counter or prescription medicines as directed by your health care provider.  If a tissue sample (biopsy) was taken during your procedure:  Do not take aspirin or blood thinners for 7 days, or as instructed by your health care provider.  Do not drink alcohol for 7 days, or as instructed by your health care provider.  Eat soft foods for the first 24 hours. SEEK MEDICAL CARE IF: You have persistent spotting of blood in your stool 2-3 days after the procedure. SEEK IMMEDIATE MEDICAL CARE IF:  You have more than a small spotting  of blood in your stool.  You pass large blood clots in your stool.  Your abdomen is swollen (distended).  You have nausea or vomiting.  You have a fever.  You have increasing abdominal pain that is not relieved with medicine.   This information is not intended to replace advice given to you by your health care provider. Make sure you discuss any questions you have with your health care provider.   Document Released: 02/13/2004 Document Revised: 04/21/2013 Document Reviewed: 03/08/2013 Elsevier Interactive Patient Education [employer].

## 2015-08-25 NOTE — Op Note (Signed)
EGD AND ILEOSCOPY PROCEDURE REPORT  PATIENT:  [name]  MR#:  [id number] Birthdate:  July 02, 1929, 82 y.o., female Endoscopist:  Dr. Rogene Houston, MD Referred By:  Dr. Rayne Du ref. provider found Procedure Date: 08/25/2015  Procedure:   EGD and ILEOSCOPY  Indications:  Patient is 82 year old Caucasian female with multiple medical problems who has postprandial bloating anorexia weight loss nausea and she also has had intermittent melena and heme positive stool. She had upper GI series with small bowel follow-through which suggests stricture involving gastrojejunostomy. Patient is status post partial gastrectomy for peptic ulcer disease several years ago.            Informed Consent:  The risks, benefits, alternatives & imponderables which include, but are not limited to, bleeding, infection, perforation, drug reaction and potential missed lesion have been reviewed.  The potential for biopsy, lesion removal, esophageal dilation, etc. have also been discussed.  Questions have been answered.  All parties agreeable.  Please see history & physical in medical record for more information.  Medications:  Demerol 40 mg IV Versed 2 mg IV Cetacaine spray topically for oropharyngeal anesthesia  First dose administered at 0742 Last dose administered at 0744  Description of procedure:  The endoscope was introduced through the mouth and advanced to the second portion of the duodenum without difficulty or limitations. The mucosal surfaces were surveyed very carefully during advancement of the scope and upon withdrawal.  Findings:  Esophagus:  Mucosa of the esophagus was normal. Erythema noted at GE junction without stricture or ring formation. GEJ:  34 cm Hiatus:  36 cm Stomach:  Small gastric pouch with patent gastrojejunostomy. No evidence of anastomotic ulcer. Scope could not be retroflexed because of small size of the pouch. Jejunum:  Jejunal mucosa was examined for about 20 cm and revealed mucosal  edema.  Therapeutic/Diagnostic Maneuvers Performed:   Jejunal biopsies taken for routine histology.  ILEOSCOPY: Digital examination of ileostomy was normal. Ultra Slim colonoscope was advanced across ileostomy into distal small bowel. Limited exam for up to 15 cm. Mucosa of terminal ileum was normal. Random biopsies taken from mucosa of ileum.   Complications:  None  EBL: Minimal  Impression: EGD findings: Small sliding hiatal hernia with mild changes of reflux esophagitis limited to GE junction. Small gastric pouch with patent gastrojejunostomy doubt evidence of stricture or neoplasm. Abnormal jejunal mucosa with edema. Biopsies taken for routine histology.  Ileoscopy: Limited exam to 15 cm. Normal mucosa of terminal ileum.  Random biopsies taken for routine histology.   Recommendations:  Standard instructions given. I will be contacting patient with biopsy results and further recommendations.   Dao Memmott U  08/25/2015  8:13 AM  CC: Dr. [name], MD & Dr. Rayne Du ref. provider found

## 2015-08-29 ENCOUNTER — Encounter (HOSPITAL_COMMUNITY): Payer: Self-pay | Admitting: Internal Medicine

## 2015-08-31 ENCOUNTER — Telehealth (INDEPENDENT_AMBULATORY_CARE_PROVIDER_SITE_OTHER): Payer: Self-pay | Admitting: Internal Medicine

## 2015-08-31 NOTE — Telephone Encounter (Signed)
Volanda Napoleon, Mrs. Szumski's husband, called saying they've not received a phone call with the results from her procedure on 2.10.17. He'd like a phone call regarding this as soon as possible.   Pt's ph# [phone] Thank you.

## 2015-09-01 NOTE — Telephone Encounter (Signed)
Patient was called and made aware that the bx showed no malignant cells. She was advised that Dr.Rehman would call her and discuss the procedure in more detail with her, and that it may be the first part of next week.

## 2015-09-05 ENCOUNTER — Other Ambulatory Visit (INDEPENDENT_AMBULATORY_CARE_PROVIDER_SITE_OTHER): Payer: Self-pay | Admitting: Internal Medicine

## 2015-09-05 ENCOUNTER — Other Ambulatory Visit: Payer: Self-pay | Admitting: Internal Medicine

## 2015-09-05 MED ORDER — RIFAXIMIN 550 MG PO TABS: 550.0000 mg | ORAL_TABLET | Freq: Three times a day (TID) | ORAL | Status: DC

## 2015-09-06 ENCOUNTER — Telehealth: Payer: Self-pay | Admitting: Family Medicine

## 2015-09-06 NOTE — Telephone Encounter (Signed)
Error

## 2015-09-12 ENCOUNTER — Other Ambulatory Visit: Payer: Self-pay | Admitting: Family Medicine

## 2015-09-28 ENCOUNTER — Other Ambulatory Visit: Payer: Self-pay | Admitting: Family Medicine

## 2015-10-10 ENCOUNTER — Encounter (HOSPITAL_COMMUNITY)
Admission: RE | Admit: 2015-10-10 | Discharge: 2015-10-10 | Disposition: A | Discharge status: Home / Self Care | Payer: Medicare Other | Source: Ambulatory Visit | Attending: Family Medicine | Admitting: Family Medicine

## 2015-10-10 ENCOUNTER — Encounter (HOSPITAL_COMMUNITY): Payer: Self-pay

## 2015-10-10 DIAGNOSIS — M810 Age-related osteoporosis without current pathological fracture: Secondary | ICD-10-CM | POA: Insufficient documentation

## 2015-10-10 LAB — RENAL FUNCTION PANEL
ALBUMIN: 3.8 g/dL (ref 3.5–5.0)
ANION GAP: 7 (ref 5–15)
BUN: 17 mg/dL (ref 6–20)
CALCIUM: 9.5 mg/dL (ref 8.9–10.3)
CO2: 28 mmol/L (ref 22–32)
Chloride: 103 mmol/L (ref 101–111)
Creatinine, Ser: 1.04 mg/dL — ABNORMAL HIGH (ref 0.44–1.00)
GFR, EST AFRICAN AMERICAN: 56 mL/min — AB (ref >60)
GFR, EST NON AFRICAN AMERICAN: 48 mL/min — AB (ref >60)
Glucose, Bld: 111 mg/dL — ABNORMAL HIGH (ref 65–99)
PHOSPHORUS: 3.8 mg/dL (ref 2.5–4.6)
POTASSIUM: 4.1 mmol/L (ref 3.5–5.1)
SODIUM: 138 mmol/L (ref 135–145)

## 2015-10-10 MED ORDER — IBANDRONATE SODIUM 3 MG/3ML IV SOLN
3.0000 mg | Freq: Once | INTRAVENOUS | Status: AC
  Administered 2015-10-10: 3 mg via INTRAVENOUS
  Filled 2015-10-10: qty 3

## 2015-10-12 NOTE — Progress Notes (Signed)
Results for [name], [name] (MRN [id number]) as of 10/12/2015 07:47  Ref. Range 10/10/2015 14:08  Sodium Latest Ref Range: 135-145 mmol/L 138  Potassium Latest Ref Range: 3.5-5.1 mmol/L 4.1  Chloride Latest Ref Range: 101-111 mmol/L 103  CO2 Latest Ref Range: 22-32 mmol/L 28  BUN Latest Ref Range: 6-20 mg/dL 17  Creatinine Latest Ref Range: 0.44-1.00 mg/dL 1.04 (H)  Calcium Latest Ref Range: 8.9-10.3 mg/dL 9.5  EGFR (Non-African Amer.) Latest Ref Range: >60 mL/min 48 (L)  EGFR (African American) Latest Ref Range: >60 mL/min 56 (L)  Glucose Latest Ref Range: 65-99 mg/dL 111 (H)  Anion gap Latest Ref Range: 5-15  7  Phosphorus Latest Ref Range: 2.5-4.6 mg/dL 3.8  Albumin Latest Ref Range: 3.5-5.0 g/dL 3.8

## 2015-10-16 ENCOUNTER — Telehealth: Payer: Self-pay | Admitting: Family Medicine

## 2015-10-16 NOTE — Telephone Encounter (Signed)
Patient wants to cancel next appointment with Dr. Christinia Gully stating he didn't find anything and she doesn't want to go back on 4/12. She wanted your opinion on it.

## 2015-10-16 NOTE — Telephone Encounter (Signed)
I am fine with her not following up with pulmonology as long as she is not having any setbacks. I do recommend for the patient to keep follow-up visit with [name]. ( she can call pulmonary doctor office and cancel)

## 2015-10-16 NOTE — Telephone Encounter (Signed)
Spoke with patient and informed her per Dr.Scott Luking-Dr.Scott is fine with her not following up with pulmonology as long as she is not having any setbacks. I do recommend for the patient to keep follow-up visit with [place]. Patient verbalized understanding.

## 2015-10-18 ENCOUNTER — Telehealth (INDEPENDENT_AMBULATORY_CARE_PROVIDER_SITE_OTHER): Payer: Self-pay | Admitting: *Deleted

## 2015-10-18 NOTE — Telephone Encounter (Signed)
[name], patient's CNA called for [name], needs to talk to you about something going on.  [phone]

## 2015-10-18 NOTE — Telephone Encounter (Signed)
Per Dr.Rehman the patient may try Augmentin 500 mg TID for 10 days, she also is to take Phazyme 1 by mouth 3 times a day. This has been called to [facility]. Patient was called and made aware.

## 2015-10-18 NOTE — Telephone Encounter (Signed)
Prescription was cancelled at [facility] and called to [facility].

## 2015-10-25 ENCOUNTER — Ambulatory Visit: Payer: Medicare Other | Admitting: Internal Medicine

## 2015-10-26 ENCOUNTER — Other Ambulatory Visit: Payer: Self-pay | Admitting: Family Medicine

## 2015-10-26 NOTE — Telephone Encounter (Signed)
May we refill topamax.

## 2015-10-26 NOTE — Telephone Encounter (Signed)
Ok six mo 

## 2015-10-31 ENCOUNTER — Encounter (INDEPENDENT_AMBULATORY_CARE_PROVIDER_SITE_OTHER): Payer: Self-pay | Admitting: Internal Medicine

## 2015-10-31 ENCOUNTER — Ambulatory Visit (INDEPENDENT_AMBULATORY_CARE_PROVIDER_SITE_OTHER): Payer: Medicare Other | Admitting: Internal Medicine

## 2015-10-31 VITALS — BP 90/72 | HR 62 | Temp 98.3°F | Resp 18 | Ht 59.0 in | Wt 74.0 lb

## 2015-10-31 DIAGNOSIS — K6389 Other specified diseases of intestine: Secondary | ICD-10-CM

## 2015-10-31 DIAGNOSIS — R634 Abnormal weight loss: Secondary | ICD-10-CM | POA: Diagnosis not present

## 2015-10-31 MED ORDER — AMOXICILLIN-POT CLAVULANATE 500-125 MG PO TABS: 500.0000 mg | ORAL_TABLET | Freq: Three times a day (TID) | ORAL | Status: DC

## 2015-10-31 NOTE — Progress Notes (Signed)
Presenting complaint;  Follow-up for bloating and weight loss.  Subjective:  Patient is 82 year old Caucasian female who is here for scheduled visit. She was last seen 3 months ago. Following that visit she underwent upper GI with small bowel study. Is suggested stricture in the region of gastrojejunal anastomosis. She therefore underwent EGD and ileoscopy on 08/25/2015. EGD revealed small sliding hiatal hernia and mild changes of reflux esophagitis limited gastric junction. Small gastric pouch with patent gastro-jejunostomy. Nonspecific finding of jejunal mucosal edema but biopsy was unremarkable. Similarly limited ileoscopy revealed normal mucosa and biopsy was unremarkable. Patient was given prescription for Xifaxan for 2 weeks. Patient reported no improvement. She called office about 2 weeks ago and was called in prescription for Augmentin. She stopped it after a couple of doses. She felt was making her bloating worse. She continues complain of bloating. She feels miserable. She is passing excessive flatus, so much so that ileostomy back sometimes falls off. She has not had any more episodes of melena. She is spitting of blood. She denies hematemesis. She says her appetite is good. She eats meals a day. She has lost 4 pounds since her last visit. She denies fever chills or night sweats.     Current Medications: Outpatient Encounter Prescriptions as of 10/31/2015  Medication Sig  . Alpha-D-Galactosidase (BEANO PO) Take by mouth 2 (two) times daily.  . ATROVENT HFA 17 MCG/ACT inhaler 2 PUFFS FOUR TIMES DAILY.  . beta carotene w/minerals (OCUVITE) tablet Take 1 tablet by mouth daily.  . diazepam (VALIUM) 5 MG tablet TAKE 1/2 TABLET DURING THE DAY AS NEEDED AND 1 TABLET AT BEDTIME.  . fluticasone (FLONASE) 50 MCG/ACT nasal spray Place 2 sprays into both nostrils daily.  Marland Kitchen levothyroxine (SYNTHROID, LEVOTHROID) 25 MCG tablet TAKE 2 TABLETS ON MONDAY AND FRIDAY.  . memantine (NAMENDA) 10 MG tablet  TAKE 1 TABLET TWICE A DAY FOR DEMENTIA.  Marland Kitchen metoprolol tartrate (LOPRESSOR) 25 MG tablet TAKE 1/2 TABLET TWICE DAILY FOR HIGH BLOOD PRESSURE.  . Multiple Vitamin (MULTIVITAMIN) tablet Take 1 tablet by mouth daily.  . pantoprazole (PROTONIX) 40 MG tablet TAKE ONE TABLET DAILY FOR ACID REFLUX.  Marland Kitchen potassium chloride (K-DUR) 10 MEQ tablet TAKE (1) TABLET BY MOUTH ONCE DAILY.  . Simethicone (PHAZYME PO) Take by mouth 3 (three) times daily.  Marland Kitchen topiramate (TOPAMAX) 25 MG tablet TAKE (1) TABLET BY MOUTH ONCE DAILY.  Marland Kitchen venlafaxine XR (EFFEXOR-XR) 75 MG 24 hr capsule TAKE 1 CAPSULE ONCE DAILY WITH BREAKFAST.  . [DISCONTINUED] rifaximin (XIFAXAN) 550 MG TABS tablet Take 1 tablet (550 mg total) by mouth 3 (three) times daily. (Patient not taking: Reported on 10/31/2015)   No facility-administered encounter medications on file as of 10/31/2015.     Objective: Blood pressure 90/72, pulse 62, temperature 98.3 F (36.8 C), temperature source Oral, resp. rate 18, height '4\' 11"'$  (1.499 m), weight 74 lb (33.566 kg). Patient is alert and in no acute distress. Conjunctiva is pink. Sclera is nonicteric Oropharyngeal mucosa is normal. No neck masses or thyromegaly noted. Cardiac exam with regular rhythm normal S1 and S2. No murmur or gallop noted. Lungs are clear to auscultation. Abdomen is flat. Bowel sounds are normal. Ileostomy is located in right low quadrant. Ileostomy bag has thick liquid brownish stool. Abdomen is soft with vague tenderness to mid abdomen. No organomegaly or masses. No LE edema or clubbing noted.  Labs/studies Results: Lab data from 08/01/2015  WBC 10.4, H&H 13.2 and 42.1 and platelet count 318K   sedimentation rate  8.  Assessment:  #1. Intractable bloating felt to be secondary to small intestinal bacterial overgrowth. She did not respond to Xifaxan. She was called in prescription for Augmentin which she stopped because of bloating. She needs to go back on it again. #2. Weight loss  does not appear to be due to diminished intake as she has good appetite. She may have an element of malabsorption or weight loss could be secondary to cavitatory lung disease. #3. Heme positive stool secondary to swallowed blood. Recent EGD and ileoscopy were unremarkable.   Plan:  Patient advised to go back on Augmentin 500/125 3 times a day or half a dose 3 times a day. Patient instructed to take it for at least 10 days and call office with progress report. Office visit in 4 months.

## 2015-10-31 NOTE — Patient Instructions (Addendum)
Can take amoxacillin potassium and clavulanate half to 1 tablet 3 times a day for 10-14 days. Please call office with progress report any finish antibiotic.

## 2015-11-07 ENCOUNTER — Telehealth (INDEPENDENT_AMBULATORY_CARE_PROVIDER_SITE_OTHER): Payer: Self-pay | Admitting: *Deleted

## 2015-11-07 NOTE — Telephone Encounter (Signed)
[name] - CNA - called and states that the patient has a swollen abdomen , Horrible Gas , burping. She has taken the medication prescribed and also Beano , Phzyme, Gas X. Nothing is helping.   This is to be addressed with Dr. Laural Golden. Call back [phone]

## 2015-11-07 NOTE — Telephone Encounter (Signed)
Patient should eliminate daily products. Flatulex1 capsule by mouth 3 times a day 2 hours before or after taking other medications. Keep a list of foods that she is eating for 1 week. Stop Augmentin.

## 2015-11-08 NOTE — Telephone Encounter (Signed)
Patient's caregiver , [name]. She was called and made aware of Dr.Rehman's recommendation.

## 2015-11-13 ENCOUNTER — Telehealth (INDEPENDENT_AMBULATORY_CARE_PROVIDER_SITE_OTHER): Payer: Self-pay | Admitting: *Deleted

## 2015-11-13 NOTE — Telephone Encounter (Signed)
[name] called to say the the CNA for the weekend told her that [name] said she was not going to take that medication anymore. CNA stated that it had ben working. I called the patient and she says that she is not going to take the medication because she stayed in the bathroom all the time, with lots of diarrhea. [name] says that the patient has gained 3 lbs.  [name] will bring list of the foods that patient has eaten to office this week.

## 2015-11-14 ENCOUNTER — Ambulatory Visit (INDEPENDENT_AMBULATORY_CARE_PROVIDER_SITE_OTHER): Payer: Medicare Other | Admitting: Family Medicine

## 2015-11-14 ENCOUNTER — Encounter: Payer: Self-pay | Admitting: Family Medicine

## 2015-11-14 VITALS — BP 100/66 | Temp 97.9°F | Ht 59.0 in | Wt 79.2 lb

## 2015-11-14 DIAGNOSIS — J471 Bronchiectasis with (acute) exacerbation: Secondary | ICD-10-CM

## 2015-11-14 MED ORDER — CEFPROZIL 500 MG PO TABS: 500.0000 mg | ORAL_TABLET | Freq: Two times a day (BID) | ORAL | Status: DC

## 2015-11-14 MED ORDER — CEFTRIAXONE SODIUM 1 G IJ SOLR
500.0000 mg | Freq: Once | INTRAMUSCULAR | Status: AC
  Administered 2015-11-14: 500 mg via INTRAMUSCULAR

## 2015-11-14 NOTE — Progress Notes (Signed)
   Subjective:    Patient ID: [name], female    DOB: [birth year], 82 y.o.   MRN: [id number]  Sinusitis This is a new problem. The current episode started 1 to 4 weeks ago. The problem is unchanged. There has been no fever. Associated symptoms include congestion, coughing and a sore throat. (Wheezing, diarrhea) Treatments tried: Mucinex. The treatment provided no relief.   Patient has no other concerns at this time.   Has a history of pulmonary infections has had CAT scans is had numerous ongoing symptoms of chest congestion and infections Review of Systems  HENT: Positive for congestion and sore throat.   Respiratory: Positive for cough.        Objective:   Physical Exam  She has rails in the left lung right lung is normal she is underweight although she is trying eat well HEENT benign neck no masses      Assessment & Plan:  Bronchiectasis concerning for the possibility of secondary infection recommend shot of antibiotics and oral antibiotics warning signs discussed follow-up if problems if high fevers or worse recheck

## 2015-11-17 ENCOUNTER — Ambulatory Visit: Payer: Medicare Other | Admitting: Family Medicine

## 2015-11-20 ENCOUNTER — Ambulatory Visit: Payer: Medicare Other | Admitting: Family Medicine

## 2015-11-24 ENCOUNTER — Other Ambulatory Visit: Payer: Self-pay | Admitting: Family Medicine

## 2015-11-28 ENCOUNTER — Other Ambulatory Visit: Payer: Self-pay | Admitting: Family Medicine

## 2015-12-18 ENCOUNTER — Emergency Department (HOSPITAL_COMMUNITY): Payer: Medicare Other

## 2015-12-18 ENCOUNTER — Encounter (HOSPITAL_COMMUNITY): Payer: Self-pay | Admitting: Emergency Medicine

## 2015-12-18 ENCOUNTER — Inpatient Hospital Stay (HOSPITAL_COMMUNITY)
Admission: EM | Admit: 2015-12-18 | Discharge: 2015-12-22 | DRG: 871 | Disposition: A | Discharge status: Skilled Nursing Facility | Payer: Medicare Other | Attending: Family Medicine | Admitting: Family Medicine

## 2015-12-18 ENCOUNTER — Inpatient Hospital Stay (HOSPITAL_COMMUNITY): Payer: Medicare Other

## 2015-12-18 DIAGNOSIS — R739 Hyperglycemia, unspecified: Secondary | ICD-10-CM | POA: Diagnosis not present

## 2015-12-18 DIAGNOSIS — Z8249 Family history of ischemic heart disease and other diseases of the circulatory system: Secondary | ICD-10-CM | POA: Diagnosis not present

## 2015-12-18 DIAGNOSIS — Z825 Family history of asthma and other chronic lower respiratory diseases: Secondary | ICD-10-CM | POA: Diagnosis not present

## 2015-12-18 DIAGNOSIS — J9621 Acute and chronic respiratory failure with hypoxia: Secondary | ICD-10-CM | POA: Diagnosis present

## 2015-12-18 DIAGNOSIS — R0603 Acute respiratory distress: Secondary | ICD-10-CM | POA: Diagnosis present

## 2015-12-18 DIAGNOSIS — Z933 Colostomy status: Secondary | ICD-10-CM

## 2015-12-18 DIAGNOSIS — A419 Sepsis, unspecified organism: Secondary | ICD-10-CM | POA: Diagnosis present

## 2015-12-18 DIAGNOSIS — Y95 Nosocomial condition: Secondary | ICD-10-CM | POA: Diagnosis present

## 2015-12-18 DIAGNOSIS — Z809 Family history of malignant neoplasm, unspecified: Secondary | ICD-10-CM | POA: Diagnosis not present

## 2015-12-18 DIAGNOSIS — R0902 Hypoxemia: Secondary | ICD-10-CM

## 2015-12-18 DIAGNOSIS — D510 Vitamin B12 deficiency anemia due to intrinsic factor deficiency: Secondary | ICD-10-CM | POA: Diagnosis not present

## 2015-12-18 DIAGNOSIS — K219 Gastro-esophageal reflux disease without esophagitis: Secondary | ICD-10-CM | POA: Diagnosis present

## 2015-12-18 DIAGNOSIS — M810 Age-related osteoporosis without current pathological fracture: Secondary | ICD-10-CM | POA: Diagnosis present

## 2015-12-18 DIAGNOSIS — Z515 Encounter for palliative care: Secondary | ICD-10-CM | POA: Diagnosis present

## 2015-12-18 DIAGNOSIS — J9691 Respiratory failure, unspecified with hypoxia: Secondary | ICD-10-CM | POA: Diagnosis not present

## 2015-12-18 DIAGNOSIS — N289 Disorder of kidney and ureter, unspecified: Secondary | ICD-10-CM | POA: Diagnosis not present

## 2015-12-18 DIAGNOSIS — R51 Headache: Secondary | ICD-10-CM | POA: Diagnosis not present

## 2015-12-18 DIAGNOSIS — J9600 Acute respiratory failure, unspecified whether with hypoxia or hypercapnia: Secondary | ICD-10-CM | POA: Diagnosis present

## 2015-12-18 DIAGNOSIS — E872 Acidosis: Secondary | ICD-10-CM | POA: Diagnosis present

## 2015-12-18 DIAGNOSIS — R404 Transient alteration of awareness: Secondary | ICD-10-CM | POA: Diagnosis not present

## 2015-12-18 DIAGNOSIS — Z833 Family history of diabetes mellitus: Secondary | ICD-10-CM | POA: Diagnosis not present

## 2015-12-18 DIAGNOSIS — R0600 Dyspnea, unspecified: Secondary | ICD-10-CM

## 2015-12-18 DIAGNOSIS — Z432 Encounter for attention to ileostomy: Secondary | ICD-10-CM | POA: Diagnosis not present

## 2015-12-18 DIAGNOSIS — R4182 Altered mental status, unspecified: Secondary | ICD-10-CM

## 2015-12-18 DIAGNOSIS — J984 Other disorders of lung: Secondary | ICD-10-CM | POA: Diagnosis not present

## 2015-12-18 DIAGNOSIS — F411 Generalized anxiety disorder: Secondary | ICD-10-CM | POA: Diagnosis present

## 2015-12-18 DIAGNOSIS — J9601 Acute respiratory failure with hypoxia: Secondary | ICD-10-CM

## 2015-12-18 DIAGNOSIS — R918 Other nonspecific abnormal finding of lung field: Secondary | ICD-10-CM | POA: Diagnosis not present

## 2015-12-18 DIAGNOSIS — J440 Chronic obstructive pulmonary disease with acute lower respiratory infection: Secondary | ICD-10-CM | POA: Diagnosis present

## 2015-12-18 DIAGNOSIS — M5430 Sciatica, unspecified side: Secondary | ICD-10-CM | POA: Diagnosis not present

## 2015-12-18 DIAGNOSIS — R0602 Shortness of breath: Secondary | ICD-10-CM | POA: Diagnosis not present

## 2015-12-18 DIAGNOSIS — E876 Hypokalemia: Secondary | ICD-10-CM | POA: Diagnosis present

## 2015-12-18 DIAGNOSIS — J9622 Acute and chronic respiratory failure with hypercapnia: Secondary | ICD-10-CM | POA: Diagnosis present

## 2015-12-18 DIAGNOSIS — R627 Adult failure to thrive: Secondary | ICD-10-CM | POA: Diagnosis not present

## 2015-12-18 DIAGNOSIS — D649 Anemia, unspecified: Secondary | ICD-10-CM | POA: Diagnosis not present

## 2015-12-18 DIAGNOSIS — Z823 Family history of stroke: Secondary | ICD-10-CM

## 2015-12-18 DIAGNOSIS — J449 Chronic obstructive pulmonary disease, unspecified: Secondary | ICD-10-CM | POA: Diagnosis not present

## 2015-12-18 DIAGNOSIS — R7303 Prediabetes: Secondary | ICD-10-CM | POA: Diagnosis not present

## 2015-12-18 DIAGNOSIS — J189 Pneumonia, unspecified organism: Secondary | ICD-10-CM | POA: Diagnosis not present

## 2015-12-18 DIAGNOSIS — K589 Irritable bowel syndrome without diarrhea: Secondary | ICD-10-CM | POA: Diagnosis not present

## 2015-12-18 DIAGNOSIS — G9340 Encephalopathy, unspecified: Secondary | ICD-10-CM

## 2015-12-18 DIAGNOSIS — Z66 Do not resuscitate: Secondary | ICD-10-CM | POA: Diagnosis present

## 2015-12-18 DIAGNOSIS — R1311 Dysphagia, oral phase: Secondary | ICD-10-CM | POA: Diagnosis present

## 2015-12-18 DIAGNOSIS — R1084 Generalized abdominal pain: Secondary | ICD-10-CM | POA: Diagnosis not present

## 2015-12-18 DIAGNOSIS — R1312 Dysphagia, oropharyngeal phase: Secondary | ICD-10-CM | POA: Diagnosis not present

## 2015-12-18 DIAGNOSIS — N179 Acute kidney failure, unspecified: Secondary | ICD-10-CM | POA: Diagnosis not present

## 2015-12-18 DIAGNOSIS — R197 Diarrhea, unspecified: Secondary | ICD-10-CM | POA: Diagnosis not present

## 2015-12-18 DIAGNOSIS — R262 Difficulty in walking, not elsewhere classified: Secondary | ICD-10-CM | POA: Diagnosis not present

## 2015-12-18 DIAGNOSIS — R531 Weakness: Secondary | ICD-10-CM | POA: Diagnosis not present

## 2015-12-18 DIAGNOSIS — M818 Other osteoporosis without current pathological fracture: Secondary | ICD-10-CM | POA: Diagnosis not present

## 2015-12-18 DIAGNOSIS — E039 Hypothyroidism, unspecified: Secondary | ICD-10-CM | POA: Diagnosis not present

## 2015-12-18 DIAGNOSIS — M6281 Muscle weakness (generalized): Secondary | ICD-10-CM | POA: Diagnosis not present

## 2015-12-18 DIAGNOSIS — K3184 Gastroparesis: Secondary | ICD-10-CM | POA: Diagnosis not present

## 2015-12-18 DIAGNOSIS — J9602 Acute respiratory failure with hypercapnia: Secondary | ICD-10-CM | POA: Diagnosis not present

## 2015-12-18 DIAGNOSIS — A150 Tuberculosis of lung: Secondary | ICD-10-CM | POA: Diagnosis not present

## 2015-12-18 DIAGNOSIS — R252 Cramp and spasm: Secondary | ICD-10-CM | POA: Diagnosis not present

## 2015-12-18 LAB — CBC WITH DIFFERENTIAL/PLATELET
Basophils Absolute: 0 10*3/uL (ref 0.0–0.1)
Basophils Relative: 0 %
EOS ABS: 0 10*3/uL (ref 0.0–0.7)
EOS PCT: 0 %
HCT: 45.4 % (ref 36.0–46.0)
Hemoglobin: 13.9 g/dL (ref 12.0–15.0)
LYMPHS ABS: 2.6 10*3/uL (ref 0.7–4.0)
Lymphocytes Relative: 25 %
MCH: 31.4 pg (ref 26.0–34.0)
MCHC: 30.6 g/dL (ref 30.0–36.0)
MCV: 102.5 fL — ABNORMAL HIGH (ref 78.0–100.0)
Monocytes Absolute: 1 10*3/uL (ref 0.1–1.0)
Monocytes Relative: 9 %
Neutro Abs: 6.9 10*3/uL (ref 1.7–7.7)
Neutrophils Relative %: 66 %
PLATELETS: 350 10*3/uL (ref 150–400)
RBC: 4.43 MIL/uL (ref 3.87–5.11)
RDW: 15 % (ref 11.5–15.5)
WBC: 10.5 10*3/uL (ref 4.0–10.5)

## 2015-12-18 LAB — COMPREHENSIVE METABOLIC PANEL
ALK PHOS: 110 U/L (ref 38–126)
ALT: 12 U/L — AB (ref 14–54)
AST: 22 U/L (ref 15–41)
Albumin: 3.5 g/dL (ref 3.5–5.0)
Anion gap: 7 (ref 5–15)
BILIRUBIN TOTAL: 0.4 mg/dL (ref 0.3–1.2)
BUN: 26 mg/dL — AB (ref 6–20)
CALCIUM: 9.6 mg/dL (ref 8.9–10.3)
CHLORIDE: 102 mmol/L (ref 101–111)
CO2: 26 mmol/L (ref 22–32)
CREATININE: 1.36 mg/dL — AB (ref 0.44–1.00)
GFR calc Af Amer: 40 mL/min — ABNORMAL LOW (ref >60)
GFR, EST NON AFRICAN AMERICAN: 35 mL/min — AB (ref >60)
Glucose, Bld: 127 mg/dL — ABNORMAL HIGH (ref 65–99)
Potassium: 4.7 mmol/L (ref 3.5–5.1)
Sodium: 135 mmol/L (ref 135–145)
Total Protein: 7.7 g/dL (ref 6.5–8.1)

## 2015-12-18 LAB — BLOOD GAS, ARTERIAL
Acid-base deficit: 4.5 mmol/L — ABNORMAL HIGH (ref 0.0–2.0)
BICARBONATE: 18.7 meq/L — AB (ref 20.0–24.0)
Drawn by: 277331
O2 Content: 7 L/min
O2 Saturation: 97.1 %
PATIENT TEMPERATURE: 37
PH ART: 7.142 — AB (ref 7.350–7.450)
PO2 ART: 118 mmHg — AB (ref 80.0–100.0)
pCO2 arterial: 71.2 mmHg (ref 35.0–45.0)

## 2015-12-18 LAB — I-STAT CG4 LACTIC ACID, ED
LACTIC ACID, VENOUS: 2.42 mmol/L — AB (ref 0.5–2.0)
Lactic Acid, Venous: 1.3 mmol/L (ref 0.5–2.0)

## 2015-12-18 LAB — URINALYSIS, ROUTINE W REFLEX MICROSCOPIC
BILIRUBIN URINE: NEGATIVE
GLUCOSE, UA: NEGATIVE mg/dL
HGB URINE DIPSTICK: NEGATIVE
KETONES UR: NEGATIVE mg/dL
Leukocytes, UA: NEGATIVE
Nitrite: NEGATIVE
PH: 5.5 (ref 5.0–8.0)
PROTEIN: NEGATIVE mg/dL
Specific Gravity, Urine: 1.02 (ref 1.005–1.030)

## 2015-12-18 LAB — I-STAT CHEM 8, ED
BUN: 27 mg/dL — ABNORMAL HIGH (ref 6–20)
CALCIUM ION: 1.32 mmol/L — AB (ref 1.13–1.30)
CREATININE: 1.4 mg/dL — AB (ref 0.44–1.00)
Chloride: 99 mmol/L — ABNORMAL LOW (ref 101–111)
GLUCOSE: 123 mg/dL — AB (ref 65–99)
HCT: 50 % — ABNORMAL HIGH (ref 36.0–46.0)
HEMOGLOBIN: 17 g/dL — AB (ref 12.0–15.0)
Potassium: 4.7 mmol/L (ref 3.5–5.1)
Sodium: 140 mmol/L (ref 135–145)
TCO2: 29 mmol/L (ref 0–100)

## 2015-12-18 LAB — LACTIC ACID, PLASMA: Lactic Acid, Venous: 1.6 mmol/L (ref 0.5–2.0)

## 2015-12-18 LAB — I-STAT TROPONIN, ED: TROPONIN I, POC: 0 ng/mL (ref 0.00–0.08)

## 2015-12-18 MED ORDER — IPRATROPIUM BROMIDE 0.02 % IN SOLN: 2.5000 mL | Freq: Four times a day (QID) | RESPIRATORY_TRACT | Status: DC | PRN

## 2015-12-18 MED ORDER — CHLORHEXIDINE GLUCONATE 0.12 % MT SOLN
15.0000 mL | Freq: Two times a day (BID) | OROMUCOSAL | Status: DC
  Administered 2015-12-18 – 2015-12-22 (×5): 15 mL via OROMUCOSAL
  Filled 2015-12-18 (×5): qty 15

## 2015-12-18 MED ORDER — HYDRALAZINE HCL 20 MG/ML IJ SOLN: 10.0000 mg | Freq: Four times a day (QID) | INTRAMUSCULAR | Status: DC | PRN

## 2015-12-18 MED ORDER — CETYLPYRIDINIUM CHLORIDE 0.05 % MT LIQD
7.0000 mL | Freq: Two times a day (BID) | OROMUCOSAL | Status: DC
  Administered 2015-12-19 – 2015-12-22 (×4): 7 mL via OROMUCOSAL

## 2015-12-18 MED ORDER — DEXTROSE 5 % IV SOLN
1.0000 g | Freq: Once | INTRAVENOUS | Status: AC
  Administered 2015-12-18: 1 g via INTRAVENOUS
  Filled 2015-12-18: qty 10

## 2015-12-18 MED ORDER — ACETAMINOPHEN 325 MG PO TABS
650.0000 mg | ORAL_TABLET | Freq: Four times a day (QID) | ORAL | Status: DC | PRN
  Administered 2015-12-20 – 2015-12-22 (×6): 650 mg via ORAL
  Filled 2015-12-18 (×6): qty 2

## 2015-12-18 MED ORDER — VANCOMYCIN HCL 500 MG IV SOLR
500.0000 mg | INTRAVENOUS | Status: DC
  Administered 2015-12-18: 500 mg via INTRAVENOUS
  Filled 2015-12-18: qty 500

## 2015-12-18 MED ORDER — VANCOMYCIN HCL 500 MG IV SOLR
INTRAVENOUS | Status: AC
  Filled 2015-12-18: qty 500

## 2015-12-18 MED ORDER — ONDANSETRON HCL 4 MG/2ML IJ SOLN: 4.0000 mg | Freq: Three times a day (TID) | INTRAMUSCULAR | Status: AC | PRN

## 2015-12-18 MED ORDER — LEVOTHYROXINE SODIUM 25 MCG PO TABS
25.0000 ug | ORAL_TABLET | Freq: Every day | ORAL | Status: DC
  Administered 2015-12-19: 25 ug via ORAL
  Filled 2015-12-18: qty 1

## 2015-12-18 MED ORDER — POTASSIUM CHLORIDE CRYS ER 10 MEQ PO TBCR
10.0000 meq | EXTENDED_RELEASE_TABLET | Freq: Every day | ORAL | Status: DC
  Administered 2015-12-19 – 2015-12-21 (×3): 10 meq via ORAL
  Filled 2015-12-18 (×5): qty 1

## 2015-12-18 MED ORDER — PANTOPRAZOLE SODIUM 40 MG PO TBEC
40.0000 mg | DELAYED_RELEASE_TABLET | Freq: Every day | ORAL | Status: DC
  Administered 2015-12-19 – 2015-12-22 (×4): 40 mg via ORAL
  Filled 2015-12-18 (×4): qty 1

## 2015-12-18 MED ORDER — SODIUM CHLORIDE 0.9% FLUSH
3.0000 mL | Freq: Two times a day (BID) | INTRAVENOUS | Status: DC
  Administered 2015-12-18 – 2015-12-21 (×5): 3 mL via INTRAVENOUS

## 2015-12-18 MED ORDER — SODIUM CHLORIDE 0.9 % IV SOLN
Freq: Once | INTRAVENOUS | Status: AC
  Administered 2015-12-18: 14:00:00 via INTRAVENOUS

## 2015-12-18 MED ORDER — DEXTROSE 5 % IV SOLN
INTRAVENOUS | Status: AC
  Filled 2015-12-18: qty 1

## 2015-12-18 MED ORDER — VENLAFAXINE HCL ER 75 MG PO CP24
75.0000 mg | ORAL_CAPSULE | Freq: Every day | ORAL | Status: DC
  Administered 2015-12-19: 75 mg via ORAL
  Filled 2015-12-18: qty 1

## 2015-12-18 MED ORDER — SIMETHICONE 80 MG PO CHEW: 80.0000 mg | CHEWABLE_TABLET | Freq: Four times a day (QID) | ORAL | Status: DC | PRN

## 2015-12-18 MED ORDER — SODIUM CHLORIDE 0.9 % IV SOLN
INTRAVENOUS | Status: DC
  Administered 2015-12-19: 06:00:00 via INTRAVENOUS

## 2015-12-18 MED ORDER — DEXTROSE 5 % IV SOLN: 500.0000 mg | INTRAVENOUS | Status: DC

## 2015-12-18 MED ORDER — FLUTICASONE PROPIONATE 50 MCG/ACT NA SUSP
2.0000 | Freq: Every day | NASAL | Status: DC
  Administered 2015-12-19 – 2015-12-22 (×3): 2 via NASAL
  Filled 2015-12-18 (×3): qty 16

## 2015-12-18 MED ORDER — BEANO PO LIQD: 5.0000 mL | Freq: Two times a day (BID) | ORAL | Status: DC

## 2015-12-18 MED ORDER — DEXTROSE 5 % IV SOLN
500.0000 mg | INTRAVENOUS | Status: DC
  Administered 2015-12-18: 500 mg via INTRAVENOUS
  Filled 2015-12-18 (×2): qty 500

## 2015-12-18 MED ORDER — ENOXAPARIN SODIUM 30 MG/0.3ML ~~LOC~~ SOLN
30.0000 mg | SUBCUTANEOUS | Status: DC
  Administered 2015-12-18 – 2015-12-21 (×3): 30 mg via SUBCUTANEOUS
  Filled 2015-12-18 (×3): qty 0.3

## 2015-12-18 MED ORDER — METOPROLOL TARTRATE 25 MG PO TABS
25.0000 mg | ORAL_TABLET | Freq: Two times a day (BID) | ORAL | Status: DC
  Administered 2015-12-19: 25 mg via ORAL
  Filled 2015-12-18: qty 1

## 2015-12-18 MED ORDER — ACETAMINOPHEN 650 MG RE SUPP: 650.0000 mg | Freq: Four times a day (QID) | RECTAL | Status: DC | PRN

## 2015-12-18 MED ORDER — DEXTROSE 5 % IV SOLN
1.0000 g | INTRAVENOUS | Status: DC
  Administered 2015-12-18: 1 g via INTRAVENOUS
  Filled 2015-12-18 (×2): qty 1

## 2015-12-18 NOTE — ED Provider Notes (Signed)
CSN: [id number]     Arrival date & time 12/18/15  1355 History   First MD Initiated Contact with Patient 12/18/15 1409     Chief Complaint  Patient presents with  . Weakness     (Consider location/radiation/quality/duration/timing/severity/associated sxs/prior Treatment) HPI  The patient is an 82 year old female with a history of anemia, COPD, pulmonary tuberculosis, lung cavitary lesions, she has had a colostomy, she has had multiple abdominal surgeries. Last night the patient was at her baseline according to family members however this morning the patient was increased lethargic, sleepy, not answering questions appropriately and seemed to be very confused. She was too weak to walk, she came by ambulance. The patient is unable to give much history, level V caveat applies.  Past Medical History  Diagnosis Date  . Lower abdominal pain   . Gastroesophageal reflux disease   . IBS (irritable bowel syndrome)   . Migraines   . GAD (generalized anxiety disorder)   . Anemia   . Osteoporosis   . Tachycardia   . Chronic abdominal pain   . Gastroparesis   . COPD (chronic obstructive pulmonary disease) ([place])   . Lung mass   . Chronic neck and back pain   . 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 Renal insufficiency   . S/P colostomy [facility])    Past Surgical History  Procedure Laterality Date  . Stomach surgery for a bleeding ulcer when she was in her 73s    . Gastrectomy    . Tonsillectomy    . Appendectomy    . Cholecystectomy    . Abdominal hysterectomy    . Esophagogastroduodenoscopy    . Esophagogastroduodenoscopy N/A 08/25/2015    Procedure: ESOPHAGOGASTRODUODENOSCOPY (EGD);  Surgeon: Rogene Houston, MD;  Location: [facility];  Service: Endoscopy;  Laterality: N/A;  730  . Ileoscopy N/A 08/25/2015    Procedure: ILEOSCOPY THROUGH STOMA;  Surgeon: Rogene Houston, MD;  Location: [facility]O [facility];  Service: Endoscopy;  Laterality: N/A;   Family History  Problem  Relation Age of Onset  . Stroke Mother   . Stroke Father   . Diabetes Sister   . Diabetes Brother   . Stroke Brother   . Cancer Sister     unknown type  . Diabetes Sister   . Emphysema Sister   . Diabetes Brother   . Stroke Brother   . Diabetes Son   . Irritable bowel syndrome Son   . Diabetes Son   . Hypertension Son    Social History  Substance Use Topics  . Smoking status: Never Smoker   . Smokeless tobacco: Never Used  . Alcohol Use: No   OB History    Gravida Para Term Preterm AB TAB SAB Ectopic Multiple Living            3     Review of Systems  Unable to perform ROS: Mental status change      Allergies  Levaquin; Tetanus toxoid; Zolpidem tartrate; and Fosamax  Home Medications   Prior to Admission medications   Medication Sig Start Date End Date Taking? Authorizing Provider  Alpha-D-Galactosidase (BEANO PO) Take by mouth 2 (two) times daily.   Yes Historical Provider, MD  ATROVENT HFA 17 MCG/ACT inhaler 2 PUFFS FOUR TIMES DAILY. 09/12/15  Yes Kathyrn Drown, MD  diazepam (VALIUM) 5 MG tablet TAKE 1/2 TABLET DURING THE DAY AS NEEDED AND 1 TABLET AT BEDTIME. 08/24/15  Yes Kathyrn Drown, MD  fluticasone (FLONASE) 50 MCG/ACT nasal spray Place 2 sprays into both nostrils daily. 07/19/15  Yes Kathyrn Drown, MD  levothyroxine (SYNTHROID, LEVOTHROID) 25 MCG tablet TAKE 2 TABLETS ON MONDAY AND FRIDAY. & 1 TABLET ALL OTHER DAYS. 11/27/15  Yes Kathyrn Drown, MD  memantine (NAMENDA) 10 MG tablet TAKE 1 TABLET TWICE A DAY FOR DEMENTIA. 10/26/15  Yes Kathyrn Drown, MD  metoprolol tartrate (LOPRESSOR) 25 MG tablet TAKE 1/2 TABLET TWICE DAILY FOR HIGH BLOOD PRESSURE. 08/03/15  Yes Kathyrn Drown, MD  pantoprazole (PROTONIX) 40 MG tablet TAKE ONE TABLET DAILY FOR ACID REFLUX. 08/03/15  Yes Kathyrn Drown, MD  potassium chloride (K-DUR) 10 MEQ tablet TAKE (1) TABLET BY MOUTH ONCE DAILY. 10/26/15  Yes Kathyrn Drown, MD  Simethicone (PHAZYME PO) Take by mouth 3 (three) times daily.    Yes Historical Provider, MD  topiramate (TOPAMAX) 25 MG tablet TAKE (1) TABLET BY MOUTH ONCE DAILY. 10/26/15  Yes Kathyrn Drown, MD  venlafaxine XR (EFFEXOR-XR) 75 MG 24 hr capsule TAKE 1 CAPSULE ONCE DAILY WITH BREAKFAST. 11/28/15  Yes Kathyrn Drown, MD  amoxicillin-clavulanate (AUGMENTIN) 500-125 MG tablet Take 1 tablet (500 mg total) by mouth 3 (three) times daily. Patient not taking: Reported on 11/14/2015 10/31/15   Rogene Houston, MD  cefPROZIL (CEFZIL) 500 MG tablet Take 1 tablet (500 mg total) by mouth 2 (two) times daily. Patient not taking: Reported on 12/18/2015 11/14/15   Kathyrn Drown, MD   BP 152/61 mmHg  Pulse 93  Temp(Src) 98.8 F (37.1 C)  Resp 18  Ht 5' (1.524 m)  Wt 80 lb (36.288 kg)  BMI 15.62 kg/m2  SpO2 100% Physical Exam  Constitutional:  Ill appearing, somnolent, able to arouse to loud voice  HENT:  Head: Normocephalic and atraumatic.  Mouth/Throat: Oropharynx is clear and moist.  Eyes: Conjunctivae and EOM are normal. Pupils are equal, round, and reactive to light. Right eye exhibits no discharge. No scleral icterus.  Neck: Normal range of motion. Neck supple. No JVD present.  Cardiovascular: Normal rate, regular rhythm and intact distal pulses.   No murmur heard. Pulmonary/Chest: Effort normal. She has rales.  The patient is not in respiratory distress but does have bilateral rales  Abdominal: Soft. She exhibits no distension. There is no tenderness. There is no rebound.  Musculoskeletal: Normal range of motion. She exhibits edema (1+ bilateral edema at the ankles, symmetrical). She exhibits no tenderness.  Lymphadenopathy:    She has no cervical adenopathy.  Neurological:  Somnolent, follows occasional commands, arouses to loud voice, generally weak, 4 out of 5 strength in all 4 extremities  Skin: Skin is warm and dry. No rash noted. No erythema.    ED Course  Procedures (including critical care time) Labs Review Labs Reviewed  COMPREHENSIVE METABOLIC  PANEL - Abnormal; Notable for the following:    Glucose, Bld 127 (*)    BUN 26 (*)    Creatinine, Ser 1.36 (*)    ALT 12 (*)    GFR calc non Af Amer 35 (*)    GFR calc Af Amer 40 (*)    All other components within normal limits  CBC WITH DIFFERENTIAL/PLATELET - Abnormal; Notable for the following:    MCV 102.5 (*)    All other components within normal limits  BLOOD GAS, ARTERIAL - Abnormal; Notable for the following:    pH, Arterial 7.142 (*)    pCO2 arterial 71.2 (*)    pO2, Arterial 118 (*)  Bicarbonate 18.7 (*)    Acid-base deficit 4.5 (*)    All other components within normal limits  I-STAT CHEM 8, ED - Abnormal; Notable for the following:    Chloride 99 (*)    BUN 27 (*)    Creatinine, Ser 1.40 (*)    Glucose, Bld 123 (*)    Calcium, Ion 1.32 (*)    Hemoglobin 17.0 (*)    HCT 50.0 (*)    All other components within normal limits  CULTURE, BLOOD (ROUTINE X 2)  CULTURE, BLOOD (ROUTINE X 2)  URINE CULTURE  URINALYSIS, ROUTINE W REFLEX MICROSCOPIC (NOT AT [facility])  I-STAT CG4 LACTIC ACID, ED  Randolm Idol, ED  I-STAT CG4 LACTIC ACID, ED    Imaging Review Dg Chest Port 1 View  12/18/2015  CLINICAL DATA:  July 25, 2015 EXAM: PORTABLE CHEST 1 VIEW COMPARISON:  Confusion. FINDINGS: There are increasing opacities in the apices, right greater than left, with persisting calcifications in the left apex. No pneumothorax. No change in the cardiomediastinal silhouette. No other acute abnormalities. IMPRESSION: Increasing upper lobe opacities, right greater than left. Recommend follow-up to resolution. Electronically Signed   By: Dorise Bullion III M.D   On: 12/18/2015 14:51   I have personally reviewed and evaluated these images and lab results as part of my medical decision-making.   EKG Interpretation   Date/Time:  Monday December 18 2015 14:30:13 EDT Ventricular Rate:  87 PR Interval:  263 QRS Duration: 144 QT Interval:  414 QTC Calculation: 498 R Axis:   162 Text  Interpretation:  Sinus rhythm Prolonged PR interval Anterolateral  infarct, recent Non-specific intra-ventricular conduction delay since last  tracing no significant change Confirmed by Lillee Mooneyhan  MD, Causey ([id number]) on  12/18/2015 3:01:43 PM      MDM   Final diagnoses:  Respiratory distress  CAP (community acquired pneumonia)  Hypoxia    The patient appears to have decreased mental status but also has rales. She has a temperature of 98.8, she will need rectal temperature, in and out catheterization for urine, blood cultures, sepsis protocol has been ordered. The patient does appear critically ill, she has an oxygen of 63% on room air when we were finally able to get a good waveform. On nasal cannula she comes up to the mid 90%. 30cc.kg fluids incase she is septic  The pt persistently hypoxic without O2 - her MS is declined - she has an ABG showing a respiratory acidosis as well.  She has no leukocytosis but has bilateral pulmonary infiltrates in the upper lobes - this is abnormal =- the family member states that she has not had TB in many years - I have given broad spec abx and she needs bipap to help with increasing the RR to reduce the acidosis.    After fluids, abx and O2 she has improved.  If she is septic she is responding well to interventions. D/w Dr. Maudie Mercury who will admit - I appreciate his timely consultation   CRITICAL CARE Performed by: Johnna Acosta Total critical care time: 35 minutes Critical care time was exclusive of separately billable procedures and treating other patients. Critical care was necessary to treat or prevent imminent or life-threatening deterioration. Critical care was time spent personally by me on the following activities: development of treatment plan with patient and/or surrogate as well as nursing, discussions with consultants, evaluation of patient's response to treatment, examination of patient, obtaining history from patient or surrogate, ordering and  performing treatments and  interventions, ordering and review of laboratory studies, ordering and review of radiographic studies, pulse oximetry and re-evaluation of patient's condition.   Noemi Chapel, MD 12/18/15 1600

## 2015-12-18 NOTE — ED Notes (Signed)
Report given to Janett Billow, RN for room ICU-12.

## 2015-12-18 NOTE — ED Notes (Signed)
Pt niece reports pt is "not acting herself today". Niece reports patient was hard to wake up this am and is more confused than normal. Reports pt was normal yesterday. Pt alert to person/place in triage, sleepy.

## 2015-12-18 NOTE — H&P (Signed)
TRH H&P   Patient Demographics:    Almeta Geisel, is a 82 y.o. female  MRN: [id number]   DOB - 07-14-1929  Admit Date - 12/18/2015  Outpatient Primary MD for the patient is [name], MD  Referring MD/NP/PA:  Noemi Chapel  Outpatient Specialists: Sinda Du ?  Patient coming from: home  Chief Complaint  Patient presents with  . Weakness      HPI:    Jany Buckwalter  is a 82 y.o. female, hx of cavitary TB, apparently c/o altered mental status apparently at 3am woke up confused ?Marland Kitchen  Pt did not improve and was brought to ED by EMS.  Found to have pox 76% on RA.  Family states afebrile at home. No cough. No cp  In ED pt on CXR noted to have infiltrates.  Wbc 10.5,  Pt will be admitted for Hcap.      Review of systems:    In addition to the HPI above,  No Fever-chills, No Headache, No changes with Vision or hearing, No problems swallowing food or Liquids, No Chest pain, Cough No Abdominal pain, No Nausea or Vommitting, Bowel movements are regular, No Blood in stool or Urine, No dysuria, No new skin rashes or bruises, No new joints pains-aches,  No new weakness, tingling, numbness in any extremity, No recent weight gain or loss, No polyuria, polydypsia or polyphagia, No significant Mental Stressors.  A full 10 point Review of Systems was done, except as stated above, all other Review of Systems were negative.   With Past History of the following :    Past Medical History  Diagnosis Date  . Lower abdominal pain   . Gastroesophageal reflux disease   . IBS (irritable bowel syndrome)   . Migraines   . GAD (generalized anxiety disorder)   . Anemia   . Osteoporosis   . Tachycardia   . Chronic abdominal pain   . Gastroparesis   . COPD (chronic obstructive pulmonary disease) ([place])   . Lung mass   . Chronic neck and back pain   . 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 Renal insufficiency   . S/P colostomy [facility])       Past Surgical History  Procedure Laterality Date  . Stomach surgery for a bleeding ulcer when she was in her 50s    . Gastrectomy    . Tonsillectomy    . Appendectomy    . Cholecystectomy    . Abdominal hysterectomy    . Esophagogastroduodenoscopy    . Esophagogastroduodenoscopy N/A 08/25/2015    Procedure: ESOPHAGOGASTRODUODENOSCOPY (EGD);  Surgeon: Rogene Houston, MD;  Location[facility];  Service: Endoscopy;  Laterality: N/A;  730  . Ileoscopy N/A 08/25/2015    Procedure: ILEOSCOPY THROUGH STOMA;  Surgeon: Rogene Houston, MD;  Location: [facility];  Service: Endoscopy;  Laterality: N/A;  Social History:     Social History  Substance Use Topics  . Smoking status: Never Smoker   . Smokeless tobacco: Never Used  . Alcohol Use: No     Lives - at home  Mobility - walks w unassisted   Family History :     Family History  Problem Relation Age of Onset  . Stroke Mother   . Stroke Father   . Diabetes Sister   . Diabetes Brother   . Stroke Brother   . Cancer Sister     unknown type  . Diabetes Sister   . Emphysema Sister   . Diabetes Brother   . Stroke Brother   . Diabetes Son   . Irritable bowel syndrome Son   . Diabetes Son   . Hypertension Son       Home Medications:   Prior to Admission medications   Medication Sig Start Date End Date Taking? Authorizing Provider  Alpha-D-Galactosidase (BEANO PO) Take by mouth 2 (two) times daily.   Yes Historical Provider, MD  ATROVENT HFA 17 MCG/ACT inhaler 2 PUFFS FOUR TIMES DAILY. 09/12/15  Yes Kathyrn Drown, MD  diazepam (VALIUM) 5 MG tablet TAKE 1/2 TABLET DURING THE DAY AS NEEDED AND 1 TABLET AT BEDTIME. 08/24/15  Yes Kathyrn Drown, MD  fluticasone (FLONASE) 50 MCG/ACT nasal spray Place 2 sprays into both nostrils daily. 07/19/15  Yes Kathyrn Drown, MD  levothyroxine (SYNTHROID, LEVOTHROID) 25 MCG tablet TAKE 2 TABLETS  ON MONDAY AND FRIDAY. & 1 TABLET ALL OTHER DAYS. 11/27/15  Yes Kathyrn Drown, MD  memantine (NAMENDA) 10 MG tablet TAKE 1 TABLET TWICE A DAY FOR DEMENTIA. 10/26/15  Yes Kathyrn Drown, MD  metoprolol tartrate (LOPRESSOR) 25 MG tablet TAKE 1/2 TABLET TWICE DAILY FOR HIGH BLOOD PRESSURE. 08/03/15  Yes Kathyrn Drown, MD  pantoprazole (PROTONIX) 40 MG tablet TAKE ONE TABLET DAILY FOR ACID REFLUX. 08/03/15  Yes Kathyrn Drown, MD  potassium chloride (K-DUR) 10 MEQ tablet TAKE (1) TABLET BY MOUTH ONCE DAILY. 10/26/15  Yes Kathyrn Drown, MD  Simethicone (PHAZYME PO) Take by mouth 3 (three) times daily.   Yes Historical Provider, MD  topiramate (TOPAMAX) 25 MG tablet TAKE (1) TABLET BY MOUTH ONCE DAILY. 10/26/15  Yes Kathyrn Drown, MD  venlafaxine XR (EFFEXOR-XR) 75 MG 24 hr capsule TAKE 1 CAPSULE ONCE DAILY WITH BREAKFAST. 11/28/15  Yes Kathyrn Drown, MD  amoxicillin-clavulanate (AUGMENTIN) 500-125 MG tablet Take 1 tablet (500 mg total) by mouth 3 (three) times daily. Patient not taking: Reported on 11/14/2015 10/31/15   Rogene Houston, MD  cefPROZIL (CEFZIL) 500 MG tablet Take 1 tablet (500 mg total) by mouth 2 (two) times daily. Patient not taking: Reported on 12/18/2015 11/14/15   Kathyrn Drown, MD     Allergies:     Allergies  Allergen Reactions  . Levaquin [Levofloxacin] Other (See Comments)    Patient states that she became weak and extremely tired. Also, it made her real dizzy.  . Tetanus Toxoid Hives and Swelling  . Zolpidem Tartrate     Not in right state of mind  . Fosamax [Alendronate Sodium] Other (See Comments)    Severe gastritis reflux.     Physical Exam:   Vitals  Blood pressure 119/60, pulse 89, temperature 97.9 F (36.6 C), temperature source Other (Comment), resp. rate 16, height 5' (1.524 m), weight 36.288 kg (80 lb), SpO2 97 %.   1. General somnolent lying in bed in  NAD,    2. Normal affect and insight, Not Suicidal or Homicidal, Awake Alert, Oriented X 3.  3. No F.N  deficits, ALL C.Nerves Intact, Strength 5/5 all 4 extremities, Sensation intact all 4 extremities, Plantars down going.  4. Ears and Eyes appear Normal, Conjunctivae clear, PERRLA. Moist Oral Mucosa.  5. Supple Neck, No JVD, No cervical lymphadenopathy appriciated, No Carotid Bruits.  6. Symmetrical Chest wall movement, Good air movement bilaterally, CTAB.  7. RRR, No Gallops, Rubs or Murmurs, No Parasternal Heave.  8. Positive Bowel Sounds, Abdomen Soft, No tenderness, No organomegaly appriciated,No rebound -guarding or rigidity.  9.  No Cyanosis, Normal Skin Turgor, No Skin Rash or Bruise.  10. Good muscle tone,  joints appear normal , no effusions, Normal ROM.  11. No Palpable Lymph Nodes in Neck or Axillae   Data Review:    CBC  Recent Labs Lab 12/18/15 1425 12/18/15 1430  WBC  --  10.5  HGB 17.0* 13.9  HCT 50.0* 45.4  PLT  --  350  MCV  --  102.5*  MCH  --  31.4  MCHC  --  30.6  RDW  --  15.0  LYMPHSABS  --  2.6  MONOABS  --  1.0  EOSABS  --  0.0  BASOSABS  --  0.0   ------------------------------------------------------------------------------------------------------------------  Chemistries   Recent Labs Lab 12/18/15 1425 12/18/15 1430  NA 140 135  K 4.7 4.7  CL 99* 102  CO2  --  26  GLUCOSE 123* 127*  BUN 27* 26*  CREATININE 1.40* 1.36*  CALCIUM  --  9.6  AST  --  22  ALT  --  12*  ALKPHOS  --  110  BILITOT  --  0.4   ------------------------------------------------------------------------------------------------------------------ estimated creatinine clearance is 17.6 mL/min (by C-G formula based on Cr of 1.36). ------------------------------------------------------------------------------------------------------------------ No results for input(s): TSH, T4TOTAL, T3FREE, THYROIDAB in the last 72 hours.  Invalid input(s): FREET3  Coagulation profile No results for input(s): INR, PROTIME in the last 168  hours. ------------------------------------------------------------------------------------------------------------------- No results for input(s): DDIMER in the last 72 hours. -------------------------------------------------------------------------------------------------------------------  Cardiac Enzymes No results for input(s): CKMB, TROPONINI, MYOGLOBIN in the last 168 hours.  Invalid input(s): CK ------------------------------------------------------------------------------------------------------------------ No results found for: BNP   ---------------------------------------------------------------------------------------------------------------  Urinalysis    Component Value Date/Time   COLORURINE YELLOW 12/18/2015 [place] 12/18/2015 1447   LABSPEC 1.020 12/18/2015 1447   PHURINE 5.5 12/18/2015 1447   GLUCOSEU NEGATIVE 12/18/2015 1447   HGBUR NEGATIVE 12/18/2015 1447   BILIRUBINUR NEGATIVE 12/18/2015 1447   KETONESUR NEGATIVE 12/18/2015 1447   PROTEINUR NEGATIVE 12/18/2015 1447   PROTEINUR POS 05/19/2014 1505   UROBILINOGEN 0.2 08/30/2014 2021   NITRITE NEGATIVE 12/18/2015 1447   LEUKOCYTESUR NEGATIVE 12/18/2015 1447    ----------------------------------------------------------------------------------------------------------------   Imaging Results:    Dg Chest Port 1 View  12/18/2015  CLINICAL DATA:  July 25, 2015 EXAM: PORTABLE CHEST 1 VIEW COMPARISON:  Confusion. FINDINGS: There are increasing opacities in the apices, right greater than left, with persisting calcifications in the left apex. No pneumothorax. No change in the cardiomediastinal silhouette. No other acute abnormalities. IMPRESSION: Increasing upper lobe opacities, right greater than left. Recommend follow-up to resolution. Electronically Signed   By: Dorise Bullion III M.D   On: 12/18/2015 14:51     Assessment & Plan:    Principal Problem:   Respiratory distress Active  Problems:   Renal insufficiency   Hyperglycemia   Acute respiratory failure ([place])  HCAP (healthcare-associated pneumonia)    1. Acute resp failure Secondary to pneumonia Blood culture x2 Urine legionellla antigen tx with vanco iv, cefepime iv, zithromax iv Please obtain speech therapy to r/o aspiration when mental status improved.   Sepsis secondary to pneumonia Repeat lactic acid Ns iv tx with iv abx  AMS secondary to hypoxia/ acute respiratory failure Check CT brain Check urine drug screen.  Consider further w/up with esr, b12, rpr, tsh ana  Renal insufficiency Hydrate with ns iv  Hyperglycemia Check hga1c   DVT Prophylaxis Lovenox - SCDs   AM Labs Ordered, also please review Full Orders  Family Communication: Admission, patients condition and plan of care including tests being ordered have been discussed with the patient and niece " Nile Dear" who indicate understanding and agree with the plan and Code Status.  Code Status FULL  Likely DC to  snf  Condition CRITICAL  Consults called: none  Admission status: inpatient  Time spent in minutes : 27mnutes critical care   JJani GravelM.D on 12/18/2015 at 4:18 PM  Between 7am to 7pm - Pager - [phone] After 7pm go to [email or web address] - password [email or web address] - Office  [phone]

## 2015-12-18 NOTE — ED Notes (Addendum)
CRITICAL VALUE ALERT  Critical value received:  PH 7.142, C02 71.2, P02 118, Sat 97%, Bicarb 18.7, deficit 4.5   Date of notification:  12/18/2015  Time of notification:  4383  Critical value read back:Yes.    Nurse who received alert:  L. Burlene Arnt  MD notified (1st page): Dr. Sabra Heck Time of first page:  1542  MD notified (2nd page):  Time of second page:  Responding MD:  Dr. Sabra Heck  Time MD responded: [phone]

## 2015-12-18 NOTE — Progress Notes (Signed)
Pharmacy Antibiotic Note  [name] is a 82 y.o. female admitted on 12/18/2015 with pneumonia.  Pharmacy has been consulted for Vancomycin and Cefepime dosing.  Plan: Vancomycin '500mg'$  IV every 48 hours.  Goal trough 15-20 mcg/mL.  Cefepime 1gm IV every 24 hours. Monitor labs, micro and vitals.   Height: 5' (152.4 cm) Weight: 80 lb 4 oz (36.4 kg) IBW/kg (Calculated) : 45.5  Temp (24hrs), Avg:98 F (36.7 C), Min:97.4 F (36.3 C), Max:98.8 F (37.1 C)   Recent Labs Lab 12/18/15 1424 12/18/15 1425 12/18/15 1430 12/18/15 1515 12/18/15 1602  WBC  --   --  10.5  --   --   CREATININE  --  1.40* 1.36*  --   --   LATICACIDVEN 1.30  --   --  1.6 2.42*    Estimated Creatinine Clearance: 17.7 mL/min (by C-G formula based on Cr of 1.36).    Allergies  Allergen Reactions  . Levaquin [Levofloxacin] Other (See Comments)    Patient states that she became weak and extremely tired. Also, it made her real dizzy.  . Tetanus Toxoid Hives and Swelling  . Zolpidem Tartrate     Not in right state of mind  . Fosamax [Alendronate Sodium] Other (See Comments)    Severe gastritis reflux.    Antimicrobials this admission: Vanc 6/5 >>  Cefepime 6/5 >>  Azith 6/5 >>  Dose adjustments this admission: n/a  Microbiology results: 6/5 BCx: pending 6/5 UCx: pending   Thank you for allowing pharmacy to be a part of this patient's care.  Pricilla Larsson 12/18/2015 6:40 PM

## 2015-12-18 NOTE — ED Notes (Signed)
Blood cultures obtained at this time.

## 2015-12-18 NOTE — ED Notes (Signed)
CRITICAL VALUE ALERT  Critical value received:  Lactic Acid 2.42  Date of notification:  12/18/15  Time of notification:  1605  Critical value read back:Yes.    Nurse who received alert:  RMinter, RN  MD notified (1st page):  Dr. Jani Gravel  Time of first page:  1605  MD notified (2nd page):  Time of second page:  Responding MD:  Dr. Jani Gravel  Time MD responded:  [phone]

## 2015-12-19 DIAGNOSIS — J9602 Acute respiratory failure with hypercapnia: Secondary | ICD-10-CM

## 2015-12-19 LAB — CBC
HCT: 45 % (ref 36.0–46.0)
Hemoglobin: 13.6 g/dL (ref 12.0–15.0)
MCH: 31.5 pg (ref 26.0–34.0)
MCHC: 30.2 g/dL (ref 30.0–36.0)
MCV: 104.2 fL — ABNORMAL HIGH (ref 78.0–100.0)
PLATELETS: 323 10*3/uL (ref 150–400)
RBC: 4.32 MIL/uL (ref 3.87–5.11)
RDW: 15.1 % (ref 11.5–15.5)
WBC: 19.4 10*3/uL — AB (ref 4.0–10.5)

## 2015-12-19 LAB — COMPREHENSIVE METABOLIC PANEL
ALT: 11 U/L — AB (ref 14–54)
AST: 18 U/L (ref 15–41)
Albumin: 3.2 g/dL — ABNORMAL LOW (ref 3.5–5.0)
Alkaline Phosphatase: 104 U/L (ref 38–126)
Anion gap: 8 (ref 5–15)
BILIRUBIN TOTAL: 0.2 mg/dL — AB (ref 0.3–1.2)
BUN: 22 mg/dL — AB (ref 6–20)
CO2: 24 mmol/L (ref 22–32)
CREATININE: 1.09 mg/dL — AB (ref 0.44–1.00)
Calcium: 8.8 mg/dL — ABNORMAL LOW (ref 8.9–10.3)
Chloride: 106 mmol/L (ref 101–111)
GFR calc Af Amer: 52 mL/min — ABNORMAL LOW (ref >60)
GFR, EST NON AFRICAN AMERICAN: 45 mL/min — AB (ref >60)
Glucose, Bld: 119 mg/dL — ABNORMAL HIGH (ref 65–99)
POTASSIUM: 4.4 mmol/L (ref 3.5–5.1)
Sodium: 138 mmol/L (ref 135–145)
TOTAL PROTEIN: 7 g/dL (ref 6.5–8.1)

## 2015-12-19 LAB — BLOOD GAS, ARTERIAL
ACID-BASE DEFICIT: 4.2 mmol/L — AB (ref 0.0–2.0)
Bicarbonate: 19.2 mEq/L — ABNORMAL LOW (ref 20.0–24.0)
Delivery systems: POSITIVE
Drawn by: 234301
Expiratory PAP: 6
FIO2: 30
INSPIRATORY PAP: 12
MODE: POSITIVE
O2 SAT: 90.5 %
PO2 ART: 66.2 mmHg — AB (ref 80.0–100.0)
pCO2 arterial: 68.6 mmHg (ref 35.0–45.0)
pH, Arterial: 7.159 — CL (ref 7.350–7.450)

## 2015-12-19 MED ORDER — LORAZEPAM 2 MG/ML IJ SOLN: 1.0000 mg | INTRAMUSCULAR | Status: DC | PRN

## 2015-12-19 MED ORDER — MORPHINE SULFATE (PF) 2 MG/ML IV SOLN
1.0000 mg | INTRAVENOUS | Status: DC | PRN
  Administered 2015-12-19 – 2015-12-20 (×2): 1 mg via INTRAVENOUS
  Filled 2015-12-19 (×2): qty 1

## 2015-12-19 MED ORDER — DEXTROSE 5 % IV SOLN
1.0000 g | INTRAVENOUS | Status: DC
  Filled 2015-12-19: qty 10

## 2015-12-19 NOTE — Progress Notes (Signed)
Pt resting in bed with eyes closed. IV patent. Foley patent. No complaints of pain at this time. O2 on 2L/[place]. Family visiting at the bedside. Report given to dept 300. Pt to be transferred to room 330 via bed with nursing staff.

## 2015-12-19 NOTE — Progress Notes (Addendum)
PROGRESS NOTE    [name]  UEA:[id number] DOB: [birth year] DOA: 12/18/2015 PCP: [name], MD     Addendum: Received notification from patient's family that they would like to proceed with comfort measures and avoid intubation. Orders for morphine/ativan given. May remove from [place].  Brief Narrative:  82 year old woman admitted on 6/5 from home with complaints of acute encephalopathy. She was found to have acute hypercarbic respiratory failure with a pH of 7.14 and a PCO2 of 71. She was started on BiPAP. She is not responsive as of 6/6. Have discussed in detail with patient's niece and husband at bedside. We will recheck ABG. She will likely need intubation if decision is for aggressive management. I have stated my opinion that I believe that given her advanced age, her history of COPD I suspect that it may be difficult to take her off the ventilator.    Assessment & Plan:   Principal Problem:   Respiratory distress Active Problems:   Renal insufficiency   Hyperglycemia   Acute respiratory failure (HCC)   HCAP (healthcare-associated pneumonia)   Acute on chronic hypercarbic respiratory failure -Due to pneumonia on top of baseline COPD. -PH on admission was 7.14 with a PCO2 of 71. She was placed on BiPAP overnight, she remains unresponsive today. -She will need intubation if plans for aggressive management are entertained. Have discussed in detail with the niece and husband at bedside. They will make a decision in a few minutes.   Community-acquired pneumonia -Last hospitalization was in February, I do not believe she is at risk for hospital-acquired pathogens.  -we'll downgrade antibiotics to community-acquired coverage with Rocephin and azithromycin.  Acute encephalopathy -Secondary to hypercarbic respiratory failure -CT scan without acute ischemic abnormalities.  Acute renal failure -Improved with IV fluids.  Sepsis secondary to community-acquired pneumonia  -WBCs  increased, lactic acidosis increased overnight. -Continue antibiotics, may need intubation.   DVT prophylaxis: Lovenox Code Status: Remains full code for now, then we will make a decision in the next 15 minutes as to CODE STATUS Family Communication: Niece and husband Disposition Plan: Keep in ICU  Consultants:   None  Procedures:   None  Antimicrobials:   Rocephin  Azithromycin    Subjective: Unresponsive  Objective: Filed Vitals:   12/19/15 0855 12/19/15 0900 12/19/15 1000 12/19/15 1115  BP:  147/61 112/55   Pulse:  101 69 67  Temp: 98 F (36.7 C)     TempSrc: Axillary     Resp:  '22 21 10  '$ Height:      Weight:      SpO2:  94% 92%     Intake/Output Summary (Last 24 hours) at 12/19/15 1126 Last data filed at 12/19/15 0625  Gross per 24 hour  Intake    800 ml  Output      0 ml  Net    800 ml   Filed Weights   12/18/15 1502 12/18/15 1812 12/19/15 0500  Weight: 36.288 kg (80 lb) 36.4 kg (80 lb 4 oz) 37 kg (81 lb 9.1 oz)    Examination:  General exam: Unresponsive, on BiPAP Respiratory system: Fair air movement, no crackles or wheezes Cardiovascular system:RRR. No murmurs, rubs, gallops. Gastrointestinal system: Abdomen is nondistended, soft and nontender. No organomegaly or masses felt. Normal bowel sounds heard. Central nervous system: Alert and oriented. No focal neurological deficits. Extremities: No C/C/E, +pedal pulses Skin: No rashes, lesions or ulcers Psychiatry: Unable to assess given current mental state    Data Reviewed: I  have personally reviewed following labs and imaging studies  CBC:  Recent Labs Lab 12/18/15 1425 12/18/15 1430 12/19/15 0405  WBC  --  10.5 19.4*  NEUTROABS  --  6.9  --   HGB 17.0* 13.9 13.6  HCT 50.0* 45.4 45.0  MCV  --  102.5* 104.2*  PLT  --  350 540   Basic Metabolic Panel:  Recent Labs Lab 12/18/15 1425 12/18/15 1430 12/19/15 0405  NA 140 135 138  K 4.7 4.7 4.4  CL 99* 102 106  CO2  --  26 24    GLUCOSE 123* 127* 119*  BUN 27* 26* 22*  CREATININE 1.40* 1.36* 1.09*  CALCIUM  --  9.6 8.8*   GFR: Estimated Creatinine Clearance: 22.4 mL/min (by C-G formula based on Cr of 1.09). Liver Function Tests:  Recent Labs Lab 12/18/15 1430 12/19/15 0405  AST 22 18  ALT 12* 11*  ALKPHOS 110 104  BILITOT 0.4 0.2*  PROT 7.7 7.0  ALBUMIN 3.5 3.2*   No results for input(s): LIPASE, AMYLASE in the last 168 hours. No results for input(s): AMMONIA in the last 168 hours. Coagulation Profile: No results for input(s): INR, PROTIME in the last 168 hours. Cardiac Enzymes: No results for input(s): CKTOTAL, CKMB, CKMBINDEX, TROPONINI in the last 168 hours. BNP (last 3 results) No results for input(s): PROBNP in the last 8760 hours. HbA1C: No results for input(s): HGBA1C in the last 72 hours. CBG: No results for input(s): GLUCAP in the last 168 hours. Lipid Profile: No results for input(s): CHOL, HDL, LDLCALC, TRIG, CHOLHDL, LDLDIRECT in the last 72 hours. Thyroid Function Tests: No results for input(s): TSH, T4TOTAL, FREET4, T3FREE, THYROIDAB in the last 72 hours. Anemia Panel: No results for input(s): VITAMINB12, FOLATE, FERRITIN, TIBC, IRON, RETICCTPCT in the last 72 hours. Urine analysis:    Component Value Date/Time   COLORURINE YELLOW 12/18/2015 [place] 12/18/2015 1447   LABSPEC 1.020 12/18/2015 1447   PHURINE 5.5 12/18/2015 1447   GLUCOSEU NEGATIVE 12/18/2015 1447   HGBUR NEGATIVE 12/18/2015 1447   BILIRUBINUR NEGATIVE 12/18/2015 1447   KETONESUR NEGATIVE 12/18/2015 1447   PROTEINUR NEGATIVE 12/18/2015 1447   PROTEINUR POS 05/19/2014 1505   UROBILINOGEN 0.2 08/30/2014 2021   NITRITE NEGATIVE 12/18/2015 1447   LEUKOCYTESUR NEGATIVE 12/18/2015 1447   Sepsis Labs: '@LABRCNTIP'$ (procalcitonin:4,lacticidven:4)  ) Recent Results (from the past 240 hour(s))  Blood Culture (routine x 2)     Status: None (Preliminary result)   Collection Time: 12/18/15  2:55 PM   Result Value Ref Range Status   Specimen Description BLOOD LEFT ARM  Final   Special Requests BOTTLES DRAWN AEROBIC AND ANAEROBIC 8CC  Final   Culture NO GROWTH < 24 HOURS  Final   Report Status PENDING  Incomplete  Blood Culture (routine x 2)     Status: None (Preliminary result)   Collection Time: 12/18/15  3:15 PM  Result Value Ref Range Status   Specimen Description BLOOD RIGHT ARM  Final   Special Requests BOTTLES DRAWN AEROBIC ONLY 6CC  Final   Culture NO GROWTH < 24 HOURS  Final   Report Status PENDING  Incomplete         Radiology Studies: Ct Head Wo Contrast  12/18/2015  CLINICAL DATA:  Altered mental status. EXAM: CT HEAD WITHOUT CONTRAST TECHNIQUE: Contiguous axial images were obtained from the base of the skull through the vertex without intravenous contrast. COMPARISON:  August 30, 2014 FINDINGS: Paranasal sinuses, mastoid air cells, bones,  and extracranial soft tissues are normal. No subdural, epidural, or subarachnoid hemorrhage. Scattered white matter changes are identified. Subcortical white matter changes are seen in the left parietal lobe on series 2, image 26, similar in the interval given difference in slice selection. The cerebellum, brainstem, and basal cisterns are normal. No acute cortical ischemia or infarct is identified. IMPRESSION: No acute intracranial abnormality. Electronically Signed   By: Dorise Bullion III M.D   On: 12/18/2015 20:59   Dg Chest Port 1 View  12/18/2015  CLINICAL DATA:  July 25, 2015 EXAM: PORTABLE CHEST 1 VIEW COMPARISON:  Confusion. FINDINGS: There are increasing opacities in the apices, right greater than left, with persisting calcifications in the left apex. No pneumothorax. No change in the cardiomediastinal silhouette. No other acute abnormalities. IMPRESSION: Increasing upper lobe opacities, right greater than left. Recommend follow-up to resolution. Electronically Signed   By: Dorise Bullion III M.D   On: 12/18/2015 14:51         Scheduled Meds: . antiseptic oral rinse  7 mL Mouth Rinse q12n4p  . azithromycin  500 mg Intravenous Q24H  . ceFEPime (MAXIPIME) IV  1 g Intravenous Q24H  . chlorhexidine  15 mL Mouth Rinse BID  . enoxaparin (LOVENOX) injection  30 mg Subcutaneous Q24H  . fluticasone  2 spray Each Nare Daily  . levothyroxine  25 mcg Oral QAC breakfast  . metoprolol tartrate  25 mg Oral BID  . pantoprazole  40 mg Oral Daily  . potassium chloride  10 mEq Oral Daily  . sodium chloride flush  3 mL Intravenous Q12H  . vancomycin  500 mg Intravenous Q48H  . venlafaxine XR  75 mg Oral Q breakfast   Continuous Infusions: . sodium chloride 100 mL/hr at 12/19/15 0625     LOS: 1 day    Time spent: 55 minutes. Greater than 50% of this time was spent in direct contact with the patient coordinating care.     Lelon Frohlich, MD [facility] Pager [phone]  If 7PM-7AM, please contact night-coverage [email or web address] Password [email or web address] 12/19/2015, 11:26 AM

## 2015-12-19 NOTE — Care Management Note (Signed)
Case Management Note  Patient Details  Name: ANNMARIE PLEMMONS MRN: [id number] Date of Birth: [birth year]  Subjective/Objective:    Family at bedside. Patient is on the BIPAP.  Niece stated that she lives with her husband and has been independently ambulating up until this time. Patient also has an in home caregiver whom was also present in the room. Anticipate patient will need some form of assistance at discharge. Will continue to follow.              Action/Plan: more to follow.   Expected Discharge Date:                  Expected Discharge Plan:  [place]  In-House Referral:     Discharge planning Services  CM Consult  Post Acute Care Choice:    Choice offered to:     DME Arranged:    DME Agency:     HH Arranged:    [place] Agency:     Status of Service:  In process, will continue to follow  Medicare Important Message Given:    Date Medicare IM Given:    Medicare IM give by:    Date Additional Medicare IM Given:    Additional Medicare Important Message give by:     If discussed at [place] of Stay Meetings, dates discussed:    Additional Comments:  Alvie Heidelberg, RN 12/19/2015, 1:03 PM

## 2015-12-19 NOTE — Progress Notes (Signed)
md notified of pt inability to void and >500cc per bladder scanner.  New orders received and noted.

## 2015-12-19 NOTE — Progress Notes (Signed)
Called by RN the patient's healthcare power of attorney wants to make patient DO NOT RESUSCITATE. Discussed with Nile Dear, patient's niece and healthcare power of attorney, wants to make patient DO NOT RESUSCITATE. Patient earlier was made comfort care as per note from Dr. Candyce Churn. We will change the CODE STATUS to DO NOT RESUSCITATE

## 2015-12-20 DIAGNOSIS — J9601 Acute respiratory failure with hypoxia: Secondary | ICD-10-CM

## 2015-12-20 DIAGNOSIS — J984 Other disorders of lung: Secondary | ICD-10-CM

## 2015-12-20 DIAGNOSIS — N179 Acute kidney failure, unspecified: Secondary | ICD-10-CM

## 2015-12-20 DIAGNOSIS — G9340 Encephalopathy, unspecified: Secondary | ICD-10-CM

## 2015-12-20 DIAGNOSIS — J189 Pneumonia, unspecified organism: Secondary | ICD-10-CM

## 2015-12-20 DIAGNOSIS — A419 Sepsis, unspecified organism: Secondary | ICD-10-CM

## 2015-12-20 LAB — CBC
HEMATOCRIT: 44.4 % (ref 36.0–46.0)
Hemoglobin: 13.7 g/dL (ref 12.0–15.0)
MCH: 31.9 pg (ref 26.0–34.0)
MCHC: 30.9 g/dL (ref 30.0–36.0)
MCV: 103.3 fL — ABNORMAL HIGH (ref 78.0–100.0)
Platelets: 301 10*3/uL (ref 150–400)
RBC: 4.3 MIL/uL (ref 3.87–5.11)
RDW: 15.2 % (ref 11.5–15.5)
WBC: 28.2 10*3/uL — AB (ref 4.0–10.5)

## 2015-12-20 LAB — BASIC METABOLIC PANEL
ANION GAP: 7 (ref 5–15)
BUN: 29 mg/dL — AB (ref 6–20)
CO2: 26 mmol/L (ref 22–32)
Calcium: 8.7 mg/dL — ABNORMAL LOW (ref 8.9–10.3)
Chloride: 107 mmol/L (ref 101–111)
Creatinine, Ser: 1.18 mg/dL — ABNORMAL HIGH (ref 0.44–1.00)
GFR calc Af Amer: 48 mL/min — ABNORMAL LOW (ref >60)
GFR, EST NON AFRICAN AMERICAN: 41 mL/min — AB (ref >60)
Glucose, Bld: 101 mg/dL — ABNORMAL HIGH (ref 65–99)
POTASSIUM: 4.4 mmol/L (ref 3.5–5.1)
SODIUM: 140 mmol/L (ref 135–145)

## 2015-12-20 LAB — URINE CULTURE: CULTURE: NO GROWTH

## 2015-12-20 LAB — LEGIONELLA PNEUMOPHILA SEROGP 1 UR AG: L. PNEUMOPHILA SEROGP 1 UR AG: NEGATIVE

## 2015-12-20 MED ORDER — AZITHROMYCIN 250 MG PO TABS
250.0000 mg | ORAL_TABLET | Freq: Every day | ORAL | Status: DC
  Administered 2015-12-21 – 2015-12-22 (×2): 250 mg via ORAL
  Filled 2015-12-20 (×2): qty 1

## 2015-12-20 MED ORDER — MORPHINE SULFATE (PF) 2 MG/ML IV SOLN
2.0000 mg | Freq: Once | INTRAVENOUS | Status: AC
  Administered 2015-12-20: 2 mg via INTRAVENOUS
  Filled 2015-12-20: qty 1

## 2015-12-20 MED ORDER — SODIUM CHLORIDE 0.9 % IV SOLN
INTRAVENOUS | Status: DC
  Administered 2015-12-20 – 2015-12-21 (×2): via INTRAVENOUS

## 2015-12-20 MED ORDER — CEFUROXIME AXETIL 250 MG PO TABS
500.0000 mg | ORAL_TABLET | Freq: Two times a day (BID) | ORAL | Status: DC
  Administered 2015-12-20 – 2015-12-22 (×5): 500 mg via ORAL
  Filled 2015-12-20 (×5): qty 2

## 2015-12-20 NOTE — Clinical Social Work Note (Signed)
Clinical Social Work Assessment  Patient Details  Name: [name] MRN: [id number] Date of Birth: 11/13/1928  Date of referral:  12/20/15               Reason for consult:  Discharge Planning                Permission sought to share information with:  Family Supports Permission granted to share information::  Yes, Verbal Permission Granted  Name::        Agency::     Relationship::  husband, niece, nephew  Contact Information:     Housing/Transportation Living arrangements for the past 2 months:  Single Family Home Source of Information:  Patient, Spouse Patient Interpreter Needed:  None Criminal Activity/Legal Involvement Pertinent to Current Situation/Hospitalization:  No - Comment as needed Significant Relationships:  Spouse, Other Family Members Lives with:  Spouse Do you feel safe going back to the place where you live?  Yes Need for family participation in patient care:  Yes (Comment)  Care giving concerns:  Family is unable to care for pt at home.    Social Worker assessment / plan:  CSW met with pt, pt's husband, and pt's niece and nephew at bedside. CM discussed d/c planning with family earlier and they request Hospice Home. They feel that pt will need around the clock care which husband cannot provide. Yesterday, pt was unresponsive and on bipap. Family made decision for comfort care and bipap was removed. Today, pt is responsive and participating in conversation. Referral sent to Hospice Home. Family report they have had other family members at Hospice Home and are very familiar with services. Support provided.  Update: Per MD, due to improvement today, unsure if qualifies for Hospice Home. Will follow up with PT recommendations.   Employment status:  Retired Insurance information:  Medicare PT Recommendations:  Not assessed at this time Information / Referral to community resources:  Other (Comment Required) (Hospice Home)  Patient/Family's Response to care: Pt  and family agreeable to referral to Hospice Home.   Patient/Family's Understanding of and Emotional Response to Diagnosis, Current Treatment, and Prognosis: Pt and family are aware of treatment plan.   Emotional Assessment Appearance:  Appears stated age Attitude/Demeanor/Rapport:  Other (Pleasant) Affect (typically observed):  Appropriate Orientation:  Oriented to Self, Oriented to Place, Oriented to Situation Alcohol / Substance use:  Not Applicable Psych involvement (Current and /or in the community):  No (Comment)  Discharge Needs  Concerns to be addressed:  Discharge Planning Concerns Readmission within the last 30 days:  No Current discharge risk:  Terminally ill Barriers to Discharge:  No Barriers Identified   ,  [place], LCSW 12/20/2015, 1:33 PM [phone] 

## 2015-12-20 NOTE — Clinical Social Work Note (Deleted)
Patient Information    Patient Name Sex [name]   [name], [name] ([id number]) Female 01/04/1929     Room Bed   A330 A330-01    Patient Demographics    Address Phone   [address] [place] [address] [phone] (Home) *Preferred* [phone] (Mobile)    Patient Ethnicity & Race    Ethnic Group Patient Race   Not Hispanic or Latino White or Caucasian    Emergency Contact(s)    Name Relation Home Work Mobile   Anthes,Banks A Spouse [phone]  [phone]   Perry,Barbara Niece [phone]  [phone]    Documents on File      Status Date Received Description   Documents for the Patient   EMR Medication Summary Not Received     EMR Problem Summary Not Received     EMR Patient Summary Not Received     Driver's License Not Received     [place] Received 05/27/11    [place] E-Signature HIPAA Notice of Privacy Received 09/04/10 Expired   [place] E-Signature HIPAA Notice of Privacy [place] Not Received     Advance Directives/Living Will/HCPOA/POA Received 04/26/14 POA, release for Nile Dear, [place]. [place] Rad[facility] Documentation Not Received     Histor[facility] Radiology Documentation Not Received     Historic Radiology Documentation Not Received     Financial Application Not Received     HIM ROI Authorization Not Received     Release of Information Not Received     HIM ROI Authorization Not Received  RCGD--11/26/11 note to PCP   HIM ROI Authorization Not Received  RCGD--05/11/12 note to PCP   HIM ROI Authorization Not Received  RCGD--09/21/12 prog note to PCP   Advanced Beneficiary Notice (ABN) Not Received      E-Signature HIPAA Notice of Privacy Received 11/11/12 RFM   HIM ROI Authorization Not Received  RFM   AMB Correspondence Not Received  05/14 office note Teoh MD PA,    Advance Directives/Living Will/HCPOA/POA Received 12/22/12 RCGD   AMB Correspondence Not  Received  06/14 Rx Rehman MD, N    AMB Correspondence Not Received  08/14 opht [place]   E-Signature AOB [place] Not Received     HIM ROI Authorization  04/20/14 RFM   AMB Correspondence  04/21/14 SOAP NOTE Vaughn MD, S   HIM ROI Authorization  06/07/14 RFM   AMB Correspondence  07/13/14 OFFICE NOTE HAWKINS MD, E   Insurance Card Received 08/30/14 Medicare/[employer]/ dhl/aped   Other Photo ID Not Received     Insurance Card      AMB Correspondence  11/08/14 CLINICAL NOTE HAWKINS MD, E   HIM ROI Authorization  01/31/15    Insurance Card Received 08/01/15 Medicare and [employer] lbpulm 2017   Insurance Card Received 10/31/15 MCR/[employer] INSURANCE CO/RCGD/HHS   Documents for the Encounter   AOB (Assignment of Insurance Benefits) Not Received     E-signature AOB Received 12/18/15    MEDICARE RIGHTS Not Received     E-signature Medicare Rights Received 12/18/15    Cardiac Monitoring Strip  12/18/15     Admission Information    Attending Provider Admitting Provider Admission Type Admission Date/Time   Samuella Cota, MD Jani Gravel, MD Emergency 12/18/15 1408   Discharge Date Hospital Service Auth/Cert Status Service Area    Internal Medicine Incomplete [place]   Unit Room/Bed Admission Status   AP-DEPT 300 A330/A330-01  Admission (Confirmed)         Admission    [facility] Account    Name Acct ID Class Status Primary Coverage   Jamisha, [place] [id number] Inpatient Open MEDICARE - MEDICARE PART A AND B        Guarantor Account (for Hospital Account [id number])    Name Relation to Pt Service Area Active? Acct Type   Tenna Child Self CHSA Yes Personal/Family   Address Phone       [address] [place], [place] [address] [phone])          Coverage Information (for Hospital Account [id number])    1. [place] PART A AND B    F/O Payor/Plan Precert #    MEDICARE/MEDICARE PART A AND B    Subscriber Subscriber #   Tyrese, Ficek [id number]   Address Phone   [address] [place], [place] [address]        2. GENERIC COMMERCIAL/GENERIC COMMERCIAL    F/O Payor/Plan Precert #   [facility] COMMERCIAL/GENERIC COMMERCIAL    Subscriber Subscriber #   Ai, Sonnen[place] [id number]   Address Phone   [place] [address] [phone]

## 2015-12-20 NOTE — Care Management Important Message (Signed)
Important Message  Patient Details  Name: [name] MRN: [id number] Date of Birth: [birth year]   Medicare Important Message Given:  Yes    Sherald Barge, RN 12/20/2015, 2:12 PM

## 2015-12-20 NOTE — Progress Notes (Signed)
Pt family refused cardiac and continuous SpO2 monitoring; opted for comfort care. Will continue to monitor.

## 2015-12-20 NOTE — Care Management Note (Signed)
Case Management Note  Patient Details  Name: [name] MRN: [id number] Date of Birth: March 22, 1929   Expected Discharge Date:         12/21/2015        Expected Discharge Plan:  [place]  In-House Referral:     Discharge planning Services  CM Consult  Post Acute Care Choice:    Choice offered to:     DME Arranged:    DME Agency:     HH Arranged:    HH Agency:     Status of Service:  In process, will continue to follow  Medicare Important Message Given:    Date Medicare IM Given:    Medicare IM give by:    Date Additional Medicare IM Given:    Additional Medicare Important Message give by:     If discussed at [place] of Stay Meetings, dates discussed:    Additional Comment: Spoke with patient's family, who wishes for patient to go to [facility] hospice facility. They also wish to speak with Dr. Sarajane Jews, nurse aware. Marcene Brawn with [facility] notified that patient will need to transfer to facility. Natashia Roseman, Chauncey [place], RN 12/20/2015, 12:39 PM

## 2015-12-20 NOTE — Progress Notes (Signed)
PROGRESS NOTE  [name] XTA:[id number] DOB: Dec 16, 1928 DOA: 12/18/2015 PCP: [name], MD  Brief Narrative: 82 year old woman presented with confusion, found to have profound hypoxia, respiratory acidosis, acute encephalopathy, possible pneumonia, possible sepsis. Admitted for respiratory failure, sepsis, pneumonia, started on empiric antibiotics, BiPAP. Failed to improve, on 6/6 Dr. Jerilee Hoh discussed with family and the decision was made to proceed with comfort measures, avoid intubation and BiPAP.  Assessment/Plan: 1. Acute hypoxic respiratory failure, acute hypercapnic respiratory failure, acute respiratory acidosis secondary to pneumonia superimposed on baseline COPD. initially failed to improve on BiPAP and was subsequently made comfort care. Now awake as of morning 6/7. Appears stable on nasal cannula. 2. Sepsis secondary to pneumonia, community-acquired. Last hospitalization in February. Appears improved today with stable hemodynamics. 3. Acute encephalopathy secondary to acute illness as above. 4. Acute kidney injury. Improved with IV fluids. 5. COPD appears stable at this point. 6. Per Dr. Melvyn Novas 07/2015: "chronic fibrocavitary lung dz prev rx for TB by Dr Annamaria Boots around 1995 referred to pulmonary clinic 07/25/2015 by Dr [name] for hemoptysis eval s/p neg ID w/u by Dr Tommy Medal 02/2015 with 3 neg sputa for TB... [impression] "Low grade c/w bronchiectasis assoc with prior TB and suggests tendency to superinfection with gnr or atypical tb though several areas are potential mycetomas on ct which if present may cause massive bleeding and would be amenable to IR intervention if we could localize the source but clearly not a candidate for aggressive surgery or empirical sporonox and strongly doubt active TB (agree with Dr Lucianne Lei Dam's thoughts on this) 7. Status post colostomy   Remarkable improvement compared to yesterday. Awake and participates in conversation.  Discussed in detail with  husband, niece (healthcare power of attorney, her husband. We discussed clinical improvement may continue or the patient may decline again. At this point it's not clear the patient would be a candidate for hospice and therefore will pursue physical therapy evaluation. In discussion of various options, we have decided upon oral antibiotics and ongoing monitoring. If improves, may be a candidate for skilled nursing facility rehabilitation. If the patient were to worsen again, family with pursue comfort care.  DVT prophylaxis: Lovnox Code Status: DNR  Family Communication:  Disposition Plan: pending  Murray Hodgkins, MD  [facility] Direct contact: [phone] --[place]  --[email or web address]; password [email or web address]  7PM-7AM contact night coverage as above 12/20/2015, 2:35 PM  LOS: 2 days   Consultants:    Procedures:    Antimicrobials:  Ceftriaxone 6/5   Ceftin 6/7 >>   Zithromax 6/5, 6/7 >>  HPI/Subjective: Multiple family members at bedside. Patient awake and alert and participates in conversation.  Feels better today. Breathing okay. No complaints. No nausea or vomiting.  Objective: Filed Vitals:   12/19/15 1500 12/19/15 1716 12/19/15 2139 12/20/15 0546  BP: 121/46 104/42 127/44 144/55  Pulse: 69 66 80 104  Temp:   99.1 F (37.3 C) 99 F (37.2 C)  TempSrc:   Oral Oral  Resp: '18 18 16 16  '$ Height:  '4\' 9"'$  (1.448 m)    Weight:  35.381 kg (78 lb)  38 kg (83 lb 12.4 oz)  SpO2: 100% 98%  95%    Intake/Output Summary (Last 24 hours) at 12/20/15 1435 Last data filed at 12/20/15 0842  Gross per 24 hour  Intake    243 ml  Output    450 ml  Net   -207 ml     Filed Weights   12/19/15 0500  12/19/15 1716 12/20/15 0546  Weight: 37 kg (81 lb 9.1 oz) 35.381 kg (78 lb) 38 kg (83 lb 12.4 oz)    Exam:    Constitutional:  . Appears calm and comfortable, Fully awake and alert. Eyes:  . PERRL and irises appear normal . Conjunctivae and lids appear normal ENMT:   . grossly normal hearing  Respiratory:  . Rhonchi bilateral upper chest, left greater than right. No wheezes or rales.  Marland Kitchen Respiratory effort normal. No retractions or accessory muscle use. Speaks in full sentences. Cardiovascular:  . RRR, no m/r/g . No LE extremity edema   Abdomen:  . Abdomen no tenderness or masses Psychiatric:  . judgement and insight appear normal . Mental status o Orientation to person, hospital  I have personally reviewed following labs and imaging studies:  WBC, elevated up to 28.2  BMP unremarkable. BUN slightly elevated.  Scheduled Meds: . antiseptic oral rinse  7 mL Mouth Rinse q12n4p  . azithromycin  250 mg Oral Daily  . cefUROXime  500 mg Oral BID WC  . chlorhexidine  15 mL Mouth Rinse BID  . enoxaparin (LOVENOX) injection  30 mg Subcutaneous Q24H  . fluticasone  2 spray Each Nare Daily  . pantoprazole  40 mg Oral Daily  . potassium chloride  10 mEq Oral Daily  . sodium chloride flush  3 mL Intravenous Q12H   Continuous Infusions:   Principal Problem:   Acute respiratory failure with hypoxia (HCC) Active Problems:   Cavitary lung disease   Sepsis ([place])   CAP (community acquired pneumonia)   AKI (acute kidney injury) ([place])   Acute encephalopathy   LOS: 2 days   Time spent 35 minutes     By signing my name below, I, Rennis Harding attest that this documentation has been prepared under the direction and in the presence of Murray Hodgkins, MD Electronically signed: Rennis Harding   12/20/2015   I personally performed the services described in this documentation. All medical record entries made by the scribe were at my direction. I have reviewed the chart and agree that the record reflects my personal performance and is accurate and complete. Murray Hodgkins, MD

## 2015-12-20 NOTE — Progress Notes (Signed)
PT Cancellation Note  Patient Details Name: KENNESHIA REHM MRN: [id number] DOB: [birth year]   Cancelled Treatment:    Reason Eval/Treat Not Completed: Other (comment)  PT orders received, however per chart review, pt plans to d/c to hospice.  Will d/c PT orders at this time.     Beth Catherina Pates, PT, DPT X: [phone]   12/20/2015, 4:08 PM

## 2015-12-21 LAB — HEMOGLOBIN A1C
HEMOGLOBIN A1C: 6.6 % — AB (ref 4.8–5.6)
Mean Plasma Glucose: 143 mg/dL

## 2015-12-21 MED ORDER — LEVOTHYROXINE SODIUM 25 MCG PO TABS
25.0000 ug | ORAL_TABLET | Freq: Every day | ORAL | Status: DC
  Administered 2015-12-22: 25 ug via ORAL
  Filled 2015-12-21: qty 1

## 2015-12-21 NOTE — Discharge Summary (Addendum)
Physician Discharge Summary  [name] JKK:[id number] DOB: August 21, 1928 DOA: 12/18/2015  PCP: [name], MD  Admit date: 12/18/2015 Discharge date: 12/22/2015  Recommendations for Outpatient Follow-up:   Follow-up resolution of pneumonia.  Dysphagia diet, see below  Follow-up Information    Follow up with [name], MD In 2 weeks.   Specialty:  Family Medicine   Contact information:   [address] [place] [address] [phone]      Discharge Diagnoses:  1. Acute hypoxic respiratory failure, acute hypercapnic respiratory failure, acute respiratory acidosis. 2. Sepsis secondary to pneumonia 3. Community-acquired pneumonia 4. Acute encephalopathy. 5. Acute kidney injury.  6. COPD. 7. Mild oral dysphagia. 8. Chronic fibrocavitary lung disease  Discharge Condition: Improved.  Disposition: Discharge to SNF   Diet recommendation: Heart healthy   Filed Weights   12/19/15 1716 12/20/15 0546 12/21/15 0601  Weight: 35.381 kg (78 lb) 38 kg (83 lb 12.4 oz) 37.1 kg (81 lb 12.7 oz)    History of present illness:  82 year old woman presented with confusion, found to have profound hypoxia, respiratory acidosis, acute encephalopathy, possible pneumonia, possible sepsis. Admitted for respiratory failure, sepsis, pneumonia, started on empiric antibiotics, BiPAP.   Hospital Course:  Treated with antibiotics. Patient was initially started on BiPAP with no significant improvement and was subsequently made comfort care. Spontaneously improved over the next 48 hours has remained stable and nasal cannula, alert and participates in exam. In discussion with patient and family members, conservative approach was decided upon with oral antibiotics. Physical therapy recommended skilled nursing facility for short-term rehabilitation. At this point she continues to improve is stable for discharge  Individual issues as below:  1. Acute hypoxic respiratory failure, acute hypercapnic  respiratory failure, acute respiratory acidosis secondary to pneumonia superimposed on baseline COPD. has recovered spontaneously. Initially failed BiPAP and plans were made for comfort care but spontaneously improved and is doing well. Stable on nasal cannula. 2. Sepsis secondary to pneumonia, stable. 3. Acute encephalopathy, resolved. Secondary to acute respiratory acidosis. 4. Acute kidney injury, resolved with IV fluids 5. COPD, stable 6. Hypokalemia  7. Mild oral dysphagia. Dysphagia 2 diet, thin liquids. 8. Chronic fibrocavitary lung disease with negative evaluation for recurrent TB last year.  9. Status post colostomy. Abdominal exam benign. Stool in bag. Suspect mucoid discharge from rectum. Follow clinically.  Consultants:  PT- SNF on discharge  Procedures:  None  Discharge Instructions   Current Discharge Medication List    START taking these medications   Details  acetaminophen (TYLENOL) 325 MG tablet Take 2 tablets (650 mg total) by mouth every 6 (six) hours as needed for mild pain or headache (or Fever >/= 101).    azithromycin (ZITHROMAX) 250 MG tablet Take 1 tablet (250 mg total) by mouth daily. Last day 6/12    cefUROXime (CEFTIN) 500 MG tablet Take 1 tablet (500 mg total) by mouth 2 (two) times daily with a meal. Last day 6/15      CONTINUE these medications which have CHANGED   Details  diazepam (VALIUM) 5 MG tablet Take 1 tablet (5 mg total) by mouth every 12 (twelve) hours as needed for anxiety. TAKE 1/2 TABLET DURING THE DAY AS NEEDED AND 1 TABLET AT BEDTIME.       CONTINUE these medications which have NOT CHANGED   Details  Alpha-D-Galactosidase (BEANO PO) Take by mouth 2 (two) times daily.    ATROVENT HFA 17 MCG/ACT inhaler 2 PUFFS FOUR TIMES DAILY. Qty: 12.9 g, Refills: 5  fluticasone (FLONASE) 50 MCG/ACT nasal spray Place 2 sprays into both nostrils daily. Qty: 16 g, Refills: 3    levothyroxine (SYNTHROID, LEVOTHROID) 25 MCG tablet TAKE 2  TABLETS ON MONDAY AND FRIDAY. & 1 TABLET ALL OTHER DAYS. Qty: 38 tablet, Refills: 5    memantine (NAMENDA) 10 MG tablet TAKE 1 TABLET TWICE A DAY FOR DEMENTIA. Qty: 60 tablet, Refills: 5    metoprolol tartrate (LOPRESSOR) 25 MG tablet TAKE 1/2 TABLET TWICE DAILY FOR HIGH BLOOD PRESSURE. Qty: 30 tablet, Refills: 0    pantoprazole (PROTONIX) 40 MG tablet TAKE ONE TABLET DAILY FOR ACID REFLUX. Qty: 30 tablet, Refills: 5    potassium chloride (K-DUR) 10 MEQ tablet TAKE (1) TABLET BY MOUTH ONCE DAILY. Qty: 30 tablet, Refills: 5    Simethicone (PHAZYME PO) Take by mouth 3 (three) times daily.    topiramate (TOPAMAX) 25 MG tablet TAKE (1) TABLET BY MOUTH ONCE DAILY. Qty: 30 tablet, Refills: 5    venlafaxine XR (EFFEXOR-XR) 75 MG 24 hr capsule TAKE 1 CAPSULE ONCE DAILY WITH BREAKFAST. Qty: 30 capsule, Refills: 5      STOP taking these medications     amoxicillin-clavulanate (AUGMENTIN) 500-125 MG tablet      cefPROZIL (CEFZIL) 500 MG tablet        Allergies  Allergen Reactions  . Levaquin [Levofloxacin] Other (See Comments)    Patient states that she became weak and extremely tired. Also, it made her real dizzy.  . Tetanus Toxoid Hives and Swelling  . Zolpidem Tartrate     Not in right state of mind  . Fosamax [Alendronate Sodium] Other (See Comments)    Severe gastritis reflux.    The results of significant diagnostics from this hospitalization (including imaging, microbiology, ancillary and laboratory) are listed below for reference.    Significant Diagnostic Studies: Ct Head Wo Contrast  12/18/2015  CLINICAL DATA:  Altered mental status. EXAM: CT HEAD WITHOUT CONTRAST TECHNIQUE: Contiguous axial images were obtained from the base of the skull through the vertex without intravenous contrast. COMPARISON:  August 30, 2014 FINDINGS: Paranasal sinuses, mastoid air cells, bones, and extracranial soft tissues are normal. No subdural, epidural, or subarachnoid hemorrhage. Scattered  white matter changes are identified. Subcortical white matter changes are seen in the left parietal lobe on series 2, image 26, similar in the interval given difference in slice selection. The cerebellum, brainstem, and basal cisterns are normal. No acute cortical ischemia or infarct is identified. IMPRESSION: No acute intracranial abnormality. Electronically Signed   By: Dorise Bullion III M.D   On: 12/18/2015 20:59   Dg Chest Port 1 View  12/18/2015  CLINICAL DATA:  July 25, 2015 EXAM: PORTABLE CHEST 1 VIEW COMPARISON:  Confusion. FINDINGS: There are increasing opacities in the apices, right greater than left, with persisting calcifications in the left apex. No pneumothorax. No change in the cardiomediastinal silhouette. No other acute abnormalities. IMPRESSION: Increasing upper lobe opacities, right greater than left. Recommend follow-up to resolution. Electronically Signed   By: Dorise Bullion III M.D   On: 12/18/2015 14:51    Microbiology: Recent Results (from the past 240 hour(s))  Urine culture     Status: None   Collection Time: 12/18/15  2:47 PM  Result Value Ref Range Status   Specimen Description URINE, CATHETERIZED  Final   Special Requests NONE  Final   Culture NO GROWTH Performed at [facility]   Final   Report Status 12/20/2015 FINAL  Final  Blood Culture (routine x  2)     Status: None (Preliminary result)   Collection Time: 12/18/15  2:55 PM  Result Value Ref Range Status   Specimen Description BLOOD LEFT ARM  Final   Special Requests BOTTLES DRAWN AEROBIC AND ANAEROBIC 8CC  Final   Culture NO GROWTH 3 DAYS  Final   Report Status PENDING  Incomplete  Blood Culture (routine x 2)     Status: None (Preliminary result)   Collection Time: 12/18/15  3:15 PM  Result Value Ref Range Status   Specimen Description BLOOD RIGHT RADIAL DRAWN BY RN  Final   Special Requests BOTTLES DRAWN AEROBIC ONLY 6CC  Final   Culture NO GROWTH 3 DAYS  Final   Report Status PENDING   Incomplete     Labs: Basic Metabolic Panel:  Recent Labs Lab 12/18/15 1425 12/18/15 1430 12/19/15 0405 12/20/15 0633 12/22/15 0422  NA 140 135 138 140 138  K 4.7 4.7 4.4 4.4 2.8*  CL 99* 102 106 107 100*  CO2  --  '26 24 26 31  '$ GLUCOSE 123* 127* 119* 101* 132*  BUN 27* 26* 22* 29* 12  CREATININE 1.40* 1.36* 1.09* 1.18* 0.73  CALCIUM  --  9.6 8.8* 8.7* 8.3*   Liver Function Tests:  Recent Labs Lab 12/18/15 1430 12/19/15 0405  AST 22 18  ALT 12* 11*  ALKPHOS 110 104  BILITOT 0.4 0.2*  PROT 7.7 7.0  ALBUMIN 3.5 3.2*     Recent Labs Lab 12/18/15 1425 12/18/15 1430 12/19/15 0405 12/20/15 0633  WBC  --  10.5 19.4* 28.2*  NEUTROABS  --  6.9  --   --   HGB 17.0* 13.9 13.6 13.7  HCT 50.0* 45.4 45.0 44.4  MCV  --  102.5* 104.2* 103.3*  PLT  --  350 323 301   Principal Problem:   Acute respiratory failure with hypoxia (HCC) Active Problems:   Cavitary lung disease   Sepsis ([place])   CAP (community acquired pneumonia)   AKI (acute kidney injury) ([place])   Acute encephalopathy   Time coordinating discharge: 35 minutes   Signed:  Murray Hodgkins, MD [facility] 12/22/2015, 6:19 PM   By signing my name below, I, Rennis Harding attest that this documentation has been prepared under the direction and in the presence of Murray Hodgkins, MD Electronically signed: Rennis Harding  12/22/2015 11:35am   I personally performed the services described in this documentation. All medical record entries made by the scribe were at my direction. I have reviewed the chart and agree that the record reflects my personal performance and is accurate and complete. Murray Hodgkins, MD

## 2015-12-21 NOTE — Consult Note (Signed)
   [facility] CM Inpatient Consult   12/21/2015  KATALEIA QUARANTA Nov 28, 1928 [id number]  Patient screened for potential [facility] Management services. Patient is eligible for [place]. Electronic medical record reveals patient's discharge plan is Hospice care, there were no identifiable [facility] care management needs at this time. [facility] Care Management services not appropriate at this time. If patient's post hospital needs change please place a [facility] Care Management consult.  For questions please contact:   Royetta Crochet. Laymond Purser, RN, BSN, [place] [phone]) Business Cell  [phone]) Toll Free Office

## 2015-12-21 NOTE — Evaluation (Signed)
Physical Therapy Evaluation Patient Details Name: [name] MRN: [id number] DOB: 1976/11/08 Today's Date: 12/21/2015   History of Present Illness  [name] is an 82yo white female who comes [facility] on [facility] after episode of AMS. EMS found pt with SaO2: 76% on room air, and patient admitted for ARF. Pt lives at home with husband, previouslty a household ambulator without AD, s falls history, who has self confined to house for more than 10 years due to fears of colostomy bag rupture. She exits the home for medical appoints exclusively.     Clinical Impression  Upon entry, the patient is received semirecumbent in bed, family/caregiver present. The pt is awake, but drowsy and agreeable to participate. No acute distress noted at this time, although RUE edema is function limiting. The pt is alert and oriented x3, pleasant, conversational, and following simple and multi-step commands consistently. Pt received on and returned to 1L O2 throughout evaluation, with gait trial on RA and noted saturation of 91%, whereas pt is not on O2 at baseline at home. Pt returned to 1L due to sustained tachycardia, which did not resolve noticeably better after [facility] donning. Functional mobility assessment demonstrates severe weakness and global deconditioning, the pt now requiring Mod-assist physical assistance for bed mobility, and maximal effort for transfers and gait, whereas the patient performs these independently at baseline. The patient is at high risk for falls as evidence by gait speed <1.58ms, forward reach <5", and multiple LOB demonstrated throughout session. HR in 120's after gait trial, which did not recover after 90 seconds seated rest. RN notified.   Patient presenting with impairment of strength, range of motion, balance, and activity tolerance, limiting ability to perform ADL and mobility tasks at  baseline level of function. Patient will benefit from skilled intervention to address the above impairments and  limitations, in order to restore to prior level of function, improve patient safety upon discharge, and to decrease falls risk.       Follow Up Recommendations SNF    Equipment Recommendations       Recommendations for Other Services       Precautions / Restrictions Precautions Precautions: Fall      Mobility  Bed Mobility Overal bed mobility: Needs Assistance Bed Mobility: Supine to Sit     Supine to sit: Mod assist     General bed mobility comments: trunk weakness  Transfers Overall transfer level: Needs assistance Equipment used: Rolling walker (2 wheeled) Transfers: Sit to/from Stand Sit to Stand: Min assist         General transfer comment: performed with and without RW.   Ambulation/Gait Ambulation/Gait assistance: Min guard Ambulation Distance (Feet): 25 Feet (136fwith HHA, 2562fith RW, alternating retro ambulation. ) Assistive device:  (Pediatric RW. )   Gait velocity: .31m13m General Gait Details: very small steps, very weak, difficulty managing PRW.   Stairs            Wheelchair Mobility    Modified Rankin (Stroke Patients Only)       Balance Overall balance assessment: Needs assistance           Standing balance-Leahy Scale: Poor Standing balance comment: LOB x3, requires physical assist to correct.                              Pertinent Vitals/Pain Pain Assessment: No/denies pain    Home Living Family/patient expects to be discharged to::  Private residence Living Arrangements: Spouse/significant other Available Help at Discharge: Family Type of Home: House Home Access: Stairs to enter   [employer] of Steps: 1 [place]: One level;Laundry or work area in [employer]: None Additional Comments: has in home aid 7d/week 8 hours per day; husband is physically limited in providing assistance.     Prior Function Level of Independence: Needs assistance   Gait / Transfers Assistance  Needed: household distances, no AD, no falls.   ADL's / Homemaking Assistance Needed: some assistance with ADL, IADL from in home aide.         Hand Dominance   Dominant Hand: Right    Extremity/Trunk Assessment   Upper Extremity Assessment: RUE deficits/detail;Generalized weakness;Overall [facility] for tasks assessed RUE Deficits / Details: excessive third-space hypervolemia distal the wrist; limits funcitonal ability.          Lower Extremity Assessment: Overall WFL for tasks assessed;Generalized weakness      Cervical / Trunk Assessment: Kyphotic  Communication   Communication: No difficulties  Cognition Arousal/Alertness: Lethargic Behavior During Therapy: WFL for tasks assessed/performed Overall Cognitive Status: Within Functional Limits for tasks assessed                      General Comments      Exercises        Assessment/Plan    PT Assessment Patient needs continued PT services  PT Diagnosis Difficulty walking;Abnormality of gait;Generalized weakness   PT Problem List Decreased strength;Decreased range of motion;Decreased activity tolerance;Decreased balance;Decreased mobility;Decreased knowledge of use of DME  PT Treatment Interventions DME instruction;Gait training;Stair training;Functional mobility training;Therapeutic activities;Therapeutic exercise;Balance training;Patient/family education   PT Goals (Current goals can be found in the Care Plan section) Acute Rehab PT Goals Patient Stated Goal: Regain strength and independence PT Goal Formulation: With patient Time For Goal Achievement: 01/04/16 Potential to Achieve Goals: Fair    Frequency Min 3X/week   Barriers to discharge Inaccessible home environment;Decreased caregiver support      Co-evaluation               End of Session Equipment Utilized During Treatment: Gait belt;Oxygen Activity Tolerance: Patient tolerated treatment well;Patient limited by fatigue Patient left: in  chair;with call bell/phone within reach;with family/visitor present Nurse Communication: Mobility status;Other (comment) (tachycardia post gait trial. )         Time: 5456-2563 PT Time Calculation (min) (ACUTE ONLY): 34 min   Charges:   PT Evaluation $PT Eval Moderate Complexity: 1 Procedure PT Treatments $Therapeutic Activity: 8-22 mins   PT G Codes:        1:10 PM, 01/08/2016 Etta Grandchild, PT, DPT PRN Physical Therapist - [place] License # [id number] [phone] (mobile)

## 2015-12-21 NOTE — NC FL2 (Signed)
[place] MEDICAID FL2 LEVEL OF CARE SCREENING TOOL     IDENTIFICATION  Patient Name: [name] Birthdate: 02/13/1929 Sex: female Admission Date (Current Location): 12/18/2015  [facility] [place] and [place] Number:  [employer] and Address:  [facility] [address], [place]      Provider Number: [id number]  Attending Physician Name and Address:  Samuella Cota, MD  Relative Name and Phone Number:       Current Level of Care: Hospital Recommended Level of Care: [facility] Prior Approval Number:    Date Approved/Denied:   PASRR Number: [id number]   Discharge Plan: SNF    Current Diagnoses: Patient Active Problem List   Diagnosis Date Noted  . Acute respiratory failure with hypoxia ([place]) 12/20/2015  . Sepsis ([place]) 12/20/2015  . CAP (community acquired pneumonia) 12/20/2015  . AKI (acute kidney injury) ([place]) 12/20/2015  . Acute encephalopathy 12/20/2015  . Dyspnea 07/25/2015  . Frailty 05/30/2015  . 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 Hemoptysis 01/15/2015  . Tuberculosis 01/15/2015  . Pernicious anemia 09/13/2014  . Prediabetes 12/17/2013  . Hypothyroidism 10/04/2013  . Weakness 05/03/2013  . Muscle cramps 05/03/2013  . Lung mass 03/17/2013  . Chronic headaches 11/11/2012  . Abnormal CT scan, chest 11/11/2012  . Weight loss 09/21/2012  . Diarrhea 05/11/2012  . Gastroparesis 11/26/2011  . Flatulence 11/26/2011  . Osteoporosis 11/26/2011  . Ileostomy in place [facility]) 11/26/2011  . SCIATICA 08/24/2007    Orientation RESPIRATION BLADDER Height & Weight     Self, Time, Situation, Place  Normal Indwelling catheter Weight: 81 lb 12.7 oz (37.1 kg) Height:  '4\' 9"'$  (144.8 cm)  BEHAVIORAL SYMPTOMS/MOOD NEUROLOGICAL BOWEL NUTRITION STATUS  Other (Comment) (n/a)  (n/a) Colostomy Diet (Soft diet)  AMBULATORY STATUS COMMUNICATION OF NEEDS Skin   Limited Assist Verbally Normal                     Personal Care Assistance Level of Assistance  Bathing, Feeding, Dressing Bathing Assistance: Limited assistance Feeding assistance: Limited assistance Dressing Assistance: Limited assistance     Functional Limitations Info  Sight, Hearing, Speech Sight Info: Adequate Hearing Info: Adequate Speech Info: Adequate    SPECIAL CARE FACTORS FREQUENCY  PT (By licensed PT)     PT Frequency: 5              Contractures Contractures Info: Not present    Additional Factors Info  Psychotropic Code Status Info: DNR Allergies Info: Levaquin, Tetanus Toxoid, Zolpidem Tartrate, Fosamax Psychotropic Info: Ativan         Current Medications (12/21/2015):  This is the current hospital active medication list Current Facility-Administered Medications  Medication Dose Route Frequency Provider Last Rate Last Dose  . 0.9 %  sodium chloride infusion   Intravenous Continuous Samuella Cota, MD 75 mL/hr at 12/21/15 [id number]    . acetaminophen (TYLENOL) tablet 650 mg  650 mg Oral Q6H PRN Jani Gravel, MD   650 mg at 12/21/15 0327   Or  . acetaminophen (TYLENOL) suppository 650 mg  650 mg Rectal Q6H PRN Jani Gravel, MD      . antiseptic oral rinse (CPC / CETYLPYRIDINIUM CHLORIDE 0.05%) solution 7 mL  7 mL Mouth Rinse q12n4p Jani Gravel, MD   7 mL at 12/19/15 1517  . azithromycin (ZITHROMAX) tablet 250 mg  250 mg Oral Daily Samuella Cota, MD   250 mg at 12/21/15 1006  .  cefUROXime (CEFTIN) tablet 500 mg  500 mg Oral BID WC Samuella Cota, MD   500 mg at 12/21/15 1005  . chlorhexidine (PERIDEX) 0.12 % solution 15 mL  15 mL Mouth Rinse BID Jani Gravel, MD   15 mL at 12/21/15 1005  . enoxaparin (LOVENOX) injection 30 mg  30 mg Subcutaneous Q24H Jani Gravel, MD   30 mg at 12/20/15 2237  . fluticasone (FLONASE) 50 MCG/ACT nasal spray 2 spray  2 spray Each Nare Daily Jani Gravel, MD   2 spray at 12/21/15 1019  . hydrALAZINE (APRESOLINE) injection 10 mg  10 mg Intravenous Q6H PRN Jani Gravel, MD      .  ipratropium (ATROVENT) nebulizer solution 0.5 mg  2.5 mL Inhalation Q6H PRN Jani Gravel, MD      . pantoprazole (PROTONIX) EC tablet 40 mg  40 mg Oral Daily Jani Gravel, MD   40 mg at 12/21/15 1006  . potassium chloride (K-DUR,KLOR-CON) CR tablet 10 mEq  10 mEq Oral Daily Jani Gravel, MD   10 mEq at 12/21/15 1006  . simethicone (MYLICON) chewable tablet 80 mg  80 mg Oral QID PRN Jani Gravel, MD      . sodium chloride flush (NS) 0.9 % injection 3 mL  3 mL Intravenous Q12H Jani Gravel, MD   3 mL at 12/21/15 9574     Discharge Medications: Please see discharge summary for a list of discharge medications.  Relevant Imaging Results:  Relevant Lab Results:   Additional Information SS#: [id number]  Salome Arnt, [place]

## 2015-12-21 NOTE — Evaluation (Signed)
Clinical/Bedside Swallow Evaluation Patient Details  Name: [name] MRN: [id number] Date of Birth: 07-19-1928  Today's Date: 12/21/2015 Time: SLP Start Time (ACUTE ONLY): 4098 SLP Stop Time (ACUTE ONLY): 1652 SLP Time Calculation (min) (ACUTE ONLY): 21 min  Past Medical History:  Past Medical History  Diagnosis Date  . Lower abdominal pain   . Gastroesophageal reflux disease   . IBS (irritable bowel syndrome)   . Migraines   . GAD (generalized anxiety disorder)   . Anemia   . Osteoporosis   . Tachycardia   . Chronic abdominal pain   . Gastroparesis   . COPD (chronic obstructive pulmonary disease) ([place])   . Lung mass   . Chronic neck and back pain   . 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 Renal insufficiency   . S/P colostomy [facility])    Past Surgical History:  Past Surgical History  Procedure Laterality Date  . Stomach surgery for a bleeding ulcer when she was in her 64s    . Gastrectomy    . Tonsillectomy    . Appendectomy    . Cholecystectomy    . Abdominal hysterectomy    . Esophagogastroduodenoscopy    . Esophagogastroduodenoscopy N/A 08/25/2015    Procedure: ESOPHAGOGASTRODUODENOSCOPY (EGD);  Surgeon: Rogene Houston, MD;  Location: [facility]O [facility];  Service: Endoscopy;  Laterality: N/A;  730  . Ileoscopy N/A 08/25/2015    Procedure: ILEOSCOPY THROUGH STOMA;  Surgeon: Rogene Houston, MD;  Location: [facility] ENDO SUITE;  Service: Endoscopy;  Laterality: N/A;   HPI:  Pt is an 82 year old woman presented with confusion, found to have profound hypoxia, respiratory acidosis, acute encephalopathy, possible pneumonia, possible sepsis. Admitted for respiratory failure, sepsis, pneumonia, started on empiric antibiotics, BiPAP. Failed to improve, on 6/6 Dr. Jerilee Hoh discussed with family and the decision was made to proceed with comfort measures, avoid intubation and BiPAP. Chest x ray revealed increasing upper lobe opacities, right greater than left. ST to  evaluate swallow function.    Assessment / Plan / Recommendation Clinical Impression  Pt presents with mild oral dysphagia which is impacted by current edentulous status. At baseline pt wears upper dentures which she states is at home. Pt reports sticking to mechanical soft diet since cholostomy placement.  Note chest x ray with upper lobe opacites, felt not to be of aspiration etiology based upon non symptomatic BSE.  No overt signs or symptoms of aspiration with any PO this date including 3 ounce water challenge. Recommend diet downgrade to dysphagia 2 (chopped) consistencies given edentulous status with thin liquids. ST to follow up for diet tolerance.      Aspiration Risk  Mild aspiration risk    Diet Recommendation   Dysphagia 2 (chopped) , thin liquids  Medication Administration: Whole meds with liquid    Other  Recommendations Oral Care Recommendations: Oral care BID   Follow up Recommendations  24 hour supervision/assistance    Frequency and Duration min 1 x/week  1 week       Prognosis Prognosis for Safe Diet Advancement: Good Barriers to Reach Goals: Severity of deficits      Swallow Study   General Date of Onset: 12/18/15 HPI: Pt is an 82 year old woman presented with confusion, found to have profound hypoxia, respiratory acidosis, acute encephalopathy, possible pneumonia, possible sepsis. Admitted for respiratory failure, sepsis, pneumonia, started on empiric antibiotics, BiPAP. Failed to improve, on 6/6 Dr. Jerilee Hoh discussed with family and the decision was made to proceed with  comfort measures, avoid intubation and BiPAP. Chest x ray revealed increasing upper lobe opacities, right greater than left. ST to evaluate swallow function.  Type of Study: Bedside Swallow Evaluation Previous Swallow Assessment: none on file Diet Prior to this Study: Regular;Thin liquids Temperature Spikes Noted: Yes Respiratory Status: Nasal cannula History of Recent Intubation:  No Behavior/Cognition: Alert;Cooperative;Pleasant mood Oral Cavity Assessment: Within Functional Limits Oral Care Completed by SLP: No Oral Cavity - Dentition: Other (Comment) (pt wears upper dentures at baseline which are at home) Vision: Functional for self-feeding Self-Feeding Abilities: Needs assist Patient Positioning: Upright in bed Baseline Vocal Quality: Low vocal intensity Volitional Cough: Weak Volitional Swallow: Able to elicit    Oral/Motor/Sensory Function Overall Oral Motor/Sensory Function: Generalized oral weakness   Ice Chips Ice chips: Not tested   Thin Liquid Thin Liquid: Within functional limits    Nectar Thick Nectar Thick Liquid: Not tested   Honey Thick Honey Thick Liquid: Not tested   Puree Puree: Within functional limits   Solid   GO   Solid: Impaired Oral Phase Impairments: Reduced lingual movement/coordination;Impaired mastication Oral Phase Functional Implications: Prolonged oral transit Pharyngeal Phase Impairments: Suspected delayed Swallow;Multiple swallows       Arvil Chaco MA, CCC-SLP Acute Care Speech Language Pathologist    Levi Aland 12/21/2015,5:01 PM

## 2015-12-21 NOTE — Clinical Social Work Placement (Signed)
   CLINICAL SOCIAL WORK PLACEMENT  NOTE  Date:  12/21/2015  Patient Details  Name: [name] MRN: [id number] Date of Birth: Jun 30, 1929  Clinical Social Work is seeking post-discharge placement for this patient at the [place] level of care (*CSW will initial, date and re-position this form in  chart as items are completed):  Yes   Patient/family provided with [place] Work Department's list of facilities offering this level of care within the geographic area requested by the patient (or if unable, by the patient's family).  Yes   Patient/family informed of their freedom to choose among providers that offer the needed level of care, that participate in Medicare, Medicaid or managed care program needed by the patient, have an available bed and are willing to accept the patient.  Yes   Patient/family informed of [facility]'s ownership interest in [facility] and [facility], as well as of the fact that they are under no obligation to receive care at these facilities.  PASRR submitted to EDS on 12/21/15     PASRR number received on 12/21/15     Existing PASRR number confirmed on       FL2 transmitted to all facilities in geographic area requested by pt/family on 12/21/15     FL2 transmitted to all facilities within larger geographic area on       Patient informed that his/her managed care company has contracts with or will negotiate with certain facilities, including the following:            Patient/family informed of bed offers received.  Patient chooses bed at       Physician recommends and patient chooses bed at      Patient to be transferred to   on  .  Patient to be transferred to facility by       Patient family notified on   of transfer.  Name of family member notified:        PHYSICIAN       Additional Comment:    _______________________________________________ Salome Arnt, LCSW 12/21/2015, 12:52  PM [phone]

## 2015-12-21 NOTE — Progress Notes (Signed)
PROGRESS NOTE  [name] DGU:[id number] DOB: 19-Dec-1928 DOA: 12/18/2015 PCP: [name], MD  Brief Narrative: 82 year old woman presented with confusion, found to have profound hypoxia, respiratory acidosis, acute encephalopathy, possible pneumonia, possible sepsis. Admitted for respiratory failure, sepsis, pneumonia, started on empiric antibiotics, BiPAP. Failed to improve, on 6/6 Dr. Jerilee Hoh discussed with family and the decision was made to proceed with comfort measures, avoid intubation and BiPAP.  Assessment/Plan: 1. Acute hypoxic respiratory failure, acute hypercapnic respiratory failure, acute respiratory acidosis secondary to pneumonia superimposed on baseline COPD. initially failed BiPAP and plans were made for comfort care. Has had remarkable improvement which has been sustained for the last 24 hours. Remains stable on nasal cannula. 2. Sepsis secondary to pneumonia, community-acquired. Appears stable, hemodynamics stable. 3. Acute encephalopathy, resolved. Secondary to infection. 4. Acute kidney injury, improved with IV fluids. 5. COPD, appears stable. 6. Per Dr. Melvyn Novas 07/2015: "chronic fibrocavitary lung dz prev rx for TB by Dr Annamaria Boots around 1995 referred to pulmonary clinic 07/25/2015 by Dr [name] for hemoptysis eval s/p neg ID w/u by Dr Tommy Medal 02/2015 with 3 neg sputa for TB... [impression] "Low grade c/w bronchiectasis assoc with prior TB and suggests tendency to superinfection with gnr or atypical tb though several areas are potential mycetomas on ct which if present may cause massive bleeding and would be amenable to IR intervention if we could localize the source but clearly not a candidate for aggressive surgery or empirical sporonox and strongly doubt active TB (agree with Dr Lucianne Lei Dam's thoughts on this)   Overall improved. Sitting in chair awake and alert.  Continue abx and discontinue fluids. Tolerating diet.   Anticipiate discharge to SNF in 24 hours.  DVT  prophylaxis: Lovnox Code Status: DNR  Family Communication: Family bedside. Disposition Plan: pending  Murray Hodgkins, MD  [facility] Direct contact: [phone] --Via amion app OR  --[email or web address]; password [email or web address]  7PM-7AM contact night coverage as above 12/21/2015, 9:21 AM  LOS: 3 days   Consultants:    Procedures:    Antimicrobials:  Ceftriaxone 6/5   Ceftin 6/7 >>   Zithromax 6/5, 6/7 >>  HPI/Subjective: Eating a small amount without difficulty. Was unable to sleep well. Breathing is doing good. Denies coughing. Mild pain and swelling in right hand.   Objective: Filed Vitals:   12/19/15 1716 12/19/15 2139 12/20/15 0546 12/21/15 0601  BP: 104/42 127/44 144/55 138/61  Pulse: 66 80 104 84  Temp:  99.1 F (37.3 C) 99 F (37.2 C) 98.9 F (37.2 C)  TempSrc:  Oral Oral Oral  Resp: '18 16 16 18  '$ Height: '4\' 9"'$  (1.448 m)     Weight: 35.381 kg (78 lb)  38 kg (83 lb 12.4 oz) 37.1 kg (81 lb 12.7 oz)  SpO2: 98%  95% 94%    Intake/Output Summary (Last 24 hours) at 12/21/15 0921 Last data filed at 12/21/15 0600  Gross per 24 hour  Intake 768.75 ml  Output   2100 ml  Net -1331.25 ml     Filed Weights   12/19/15 1716 12/20/15 0546 12/21/15 0601  Weight: 35.381 kg (78 lb) 38 kg (83 lb 12.4 oz) 37.1 kg (81 lb 12.7 oz)    Exam:    Constitutional:  . Appears calm and comfortable. Sitting up in chair. Very awake and alert. Participates well in conversation and exam. ENMT:  . grossly normal hearing  Respiratory:  . Bilaterally respiratory crackles and some rhonchi anterior. No wheezes or rales.  Marland Kitchen Respiratory effort  normal. Speaks in full sentences. . No retractions or accessory muscle use.  Cardiovascular:  . RRR, no m/r/g . No LE extremity edema   Psychiatric:  . judgement and insight appear normal . Mental status o Appears somewhat confused  I have personally reviewed following labs and imaging studies:  No new data  Scheduled Meds: .  antiseptic oral rinse  7 mL Mouth Rinse q12n4p  . azithromycin  250 mg Oral Daily  . cefUROXime  500 mg Oral BID WC  . chlorhexidine  15 mL Mouth Rinse BID  . enoxaparin (LOVENOX) injection  30 mg Subcutaneous Q24H  . fluticasone  2 spray Each Nare Daily  . pantoprazole  40 mg Oral Daily  . potassium chloride  10 mEq Oral Daily  . sodium chloride flush  3 mL Intravenous Q12H   Continuous Infusions: . sodium chloride 75 mL/hr at 12/20/15 1945    Principal Problem:   Acute respiratory failure with hypoxia (HCC) Active Problems:   Cavitary lung disease   Sepsis ([place])   CAP (community acquired pneumonia)   AKI (acute kidney injury) ([place])   Acute encephalopathy   LOS: 3 days   Time spent: 20 minutes   By signing my name below, I, Rennis Harding attest that this documentation has been prepared under the direction and in the presence of Murray Hodgkins, MD Electronically signed: Rennis Harding   12/21/2015 11:51am   I personally performed the services described in this documentation. All medical record entries made by the scribe were at my direction. I have reviewed the chart and agree that the record reflects my personal performance and is accurate and complete. Murray Hodgkins, MD

## 2015-12-21 NOTE — Clinical Social Work Note (Signed)
CSW discussed SNF with pt and niece as recommended by PT. Pt agreeable and requests [facility]. Aware of Medicare coverage/criteria. She admits that she is feeling weak and would benefit from short term rehab prior to return home.   [name], [name]

## 2015-12-22 ENCOUNTER — Inpatient Hospital Stay
Admission: RE | Admit: 2015-12-22 | Discharge: 2016-01-12 | Disposition: A | Discharge status: Home / Self Care | Payer: Medicare Other | Source: Ambulatory Visit | Attending: Internal Medicine | Admitting: Internal Medicine

## 2015-12-22 DIAGNOSIS — R634 Abnormal weight loss: Secondary | ICD-10-CM | POA: Diagnosis not present

## 2015-12-22 DIAGNOSIS — A150 Tuberculosis of lung: Secondary | ICD-10-CM | POA: Diagnosis not present

## 2015-12-22 DIAGNOSIS — R05 Cough: Secondary | ICD-10-CM | POA: Diagnosis not present

## 2015-12-22 DIAGNOSIS — E039 Hypothyroidism, unspecified: Secondary | ICD-10-CM | POA: Diagnosis not present

## 2015-12-22 DIAGNOSIS — R109 Unspecified abdominal pain: Secondary | ICD-10-CM | POA: Diagnosis not present

## 2015-12-22 DIAGNOSIS — R0689 Other abnormalities of breathing: Secondary | ICD-10-CM | POA: Diagnosis not present

## 2015-12-22 DIAGNOSIS — R7303 Prediabetes: Secondary | ICD-10-CM | POA: Diagnosis not present

## 2015-12-22 DIAGNOSIS — J9601 Acute respiratory failure with hypoxia: Secondary | ICD-10-CM | POA: Diagnosis not present

## 2015-12-22 DIAGNOSIS — R262 Difficulty in walking, not elsewhere classified: Secondary | ICD-10-CM | POA: Diagnosis not present

## 2015-12-22 DIAGNOSIS — D510 Vitamin B12 deficiency anemia due to intrinsic factor deficiency: Secondary | ICD-10-CM | POA: Diagnosis not present

## 2015-12-22 DIAGNOSIS — R197 Diarrhea, unspecified: Secondary | ICD-10-CM | POA: Diagnosis not present

## 2015-12-22 DIAGNOSIS — M79672 Pain in left foot: Secondary | ICD-10-CM | POA: Diagnosis not present

## 2015-12-22 DIAGNOSIS — F411 Generalized anxiety disorder: Secondary | ICD-10-CM | POA: Diagnosis not present

## 2015-12-22 DIAGNOSIS — R1084 Generalized abdominal pain: Secondary | ICD-10-CM | POA: Diagnosis not present

## 2015-12-22 DIAGNOSIS — K9403 Colostomy malfunction: Secondary | ICD-10-CM | POA: Diagnosis not present

## 2015-12-22 DIAGNOSIS — J189 Pneumonia, unspecified organism: Secondary | ICD-10-CM | POA: Diagnosis not present

## 2015-12-22 DIAGNOSIS — Z432 Encounter for attention to ileostomy: Secondary | ICD-10-CM | POA: Diagnosis not present

## 2015-12-22 DIAGNOSIS — K3184 Gastroparesis: Secondary | ICD-10-CM | POA: Diagnosis not present

## 2015-12-22 DIAGNOSIS — J449 Chronic obstructive pulmonary disease, unspecified: Secondary | ICD-10-CM | POA: Diagnosis not present

## 2015-12-22 DIAGNOSIS — J9691 Respiratory failure, unspecified with hypoxia: Secondary | ICD-10-CM | POA: Diagnosis not present

## 2015-12-22 DIAGNOSIS — R1312 Dysphagia, oropharyngeal phase: Secondary | ICD-10-CM | POA: Diagnosis not present

## 2015-12-22 DIAGNOSIS — M6281 Muscle weakness (generalized): Secondary | ICD-10-CM | POA: Diagnosis not present

## 2015-12-22 DIAGNOSIS — R627 Adult failure to thrive: Secondary | ICD-10-CM | POA: Diagnosis not present

## 2015-12-22 DIAGNOSIS — K589 Irritable bowel syndrome without diarrhea: Secondary | ICD-10-CM | POA: Diagnosis not present

## 2015-12-22 DIAGNOSIS — A419 Sepsis, unspecified organism: Principal | ICD-10-CM

## 2015-12-22 DIAGNOSIS — D649 Anemia, unspecified: Secondary | ICD-10-CM | POA: Diagnosis not present

## 2015-12-22 DIAGNOSIS — N179 Acute kidney failure, unspecified: Secondary | ICD-10-CM | POA: Diagnosis not present

## 2015-12-22 DIAGNOSIS — M5430 Sciatica, unspecified side: Secondary | ICD-10-CM | POA: Diagnosis not present

## 2015-12-22 DIAGNOSIS — M818 Other osteoporosis without current pathological fracture: Secondary | ICD-10-CM | POA: Diagnosis not present

## 2015-12-22 DIAGNOSIS — R918 Other nonspecific abnormal finding of lung field: Secondary | ICD-10-CM | POA: Diagnosis not present

## 2015-12-22 DIAGNOSIS — R51 Headache: Secondary | ICD-10-CM | POA: Diagnosis not present

## 2015-12-22 DIAGNOSIS — J984 Other disorders of lung: Secondary | ICD-10-CM | POA: Diagnosis not present

## 2015-12-22 DIAGNOSIS — F0390 Unspecified dementia without behavioral disturbance: Secondary | ICD-10-CM | POA: Diagnosis not present

## 2015-12-22 DIAGNOSIS — R252 Cramp and spasm: Secondary | ICD-10-CM | POA: Diagnosis not present

## 2015-12-22 DIAGNOSIS — M1000 Idiopathic gout, unspecified site: Secondary | ICD-10-CM | POA: Diagnosis not present

## 2015-12-22 DIAGNOSIS — K219 Gastro-esophageal reflux disease without esophagitis: Secondary | ICD-10-CM | POA: Diagnosis not present

## 2015-12-22 LAB — BASIC METABOLIC PANEL
ANION GAP: 7 (ref 5–15)
BUN: 12 mg/dL (ref 6–20)
CO2: 31 mmol/L (ref 22–32)
CREATININE: 0.73 mg/dL (ref 0.44–1.00)
Calcium: 8.3 mg/dL — ABNORMAL LOW (ref 8.9–10.3)
Chloride: 100 mmol/L — ABNORMAL LOW (ref 101–111)
Glucose, Bld: 132 mg/dL — ABNORMAL HIGH (ref 65–99)
Potassium: 2.8 mmol/L — ABNORMAL LOW (ref 3.5–5.1)
Sodium: 138 mmol/L (ref 135–145)

## 2015-12-22 MED ORDER — ACETAMINOPHEN 325 MG PO TABS: 650.0000 mg | ORAL_TABLET | Freq: Four times a day (QID) | ORAL | Status: AC | PRN

## 2015-12-22 MED ORDER — CEFUROXIME AXETIL 500 MG PO TABS
500.0000 mg | ORAL_TABLET | Freq: Two times a day (BID) | ORAL | Status: DC
Start: 2015-12-22 — End: 2016-01-01

## 2015-12-22 MED ORDER — POTASSIUM CHLORIDE 20 MEQ PO PACK
40.0000 meq | PACK | Freq: Three times a day (TID) | ORAL | Status: DC
  Administered 2015-12-22 (×2): 40 meq via ORAL
  Filled 2015-12-22 (×2): qty 2

## 2015-12-22 MED ORDER — DIAZEPAM 5 MG PO TABS: 5.0000 mg | ORAL_TABLET | Freq: Two times a day (BID) | ORAL | Status: DC | PRN

## 2015-12-22 MED ORDER — AZITHROMYCIN 250 MG PO TABS: 250.0000 mg | ORAL_TABLET | Freq: Every day | ORAL | Status: DC

## 2015-12-22 MED ORDER — DIAZEPAM 5 MG PO TABS: 2.5000 mg | ORAL_TABLET | Freq: Two times a day (BID) | ORAL | Status: DC | PRN

## 2015-12-22 NOTE — Clinical Social Work Placement (Signed)
   CLINICAL SOCIAL WORK PLACEMENT  NOTE  Date:  12/22/2015  Patient Details  Name: [name] MRN: [id number] Date of Birth: [birth year]  Clinical Social Work is seeking post-discharge placement for this patient at the [place] level of care (*CSW will initial, date and re-position this form in  chart as items are completed):  Yes   Patient/family provided with [place] Work Department's list of facilities offering this level of care within the geographic area requested by the patient (or if unable, by the patient's family).  Yes   Patient/family informed of their freedom to choose among providers that offer the needed level of care, that participate in Medicare, Medicaid or managed care program needed by the patient, have an available bed and are willing to accept the patient.  Yes   Patient/family informed of [facility]'s ownership interest in [facility] and [facility], as well as of the fact that they are under no obligation to receive care at these facilities.  PASRR submitted to EDS on 12/21/15     PASRR number received on 12/21/15     Existing PASRR number confirmed on       FL2 transmitted to all facilities in geographic area requested by pt/family on 12/21/15     FL2 transmitted to all facilities within larger geographic area on       Patient informed that his/her managed care company has contracts with or will negotiate with certain facilities, including the following:        Yes   Patient/family informed of bed offers received.  Patient chooses bed at [facility]     Physician recommends and patient chooses bed at      Patient to be transferred to [facility] on 12/22/15.  Patient to be transferred to facility by staff     Patient family notified on 12/22/15 of transfer.  Name of family member notified:  Debbie- niece     PHYSICIAN       Additional Comment:     _______________________________________________ Salome Arnt, LCSW 12/22/2015, 12:43 PM [phone]

## 2015-12-22 NOTE — Care Management Important Message (Signed)
Important Message  Patient Details  Name: [name] MRN: [id number] Date of Birth: [birth year]   Medicare Important Message Given:  Yes    Sherald Barge, RN 12/22/2015, 10:37 AM

## 2015-12-22 NOTE — Progress Notes (Signed)
Speech Language Pathology Treatment: Dysphagia  Patient Details Name: [name] MRN: [id number] DOB: 1929/05/19 Today's Date: 12/22/2015 Time: 2481-8590 SLP Time Calculation (min) (ACUTE ONLY): 12 min  Assessment / Plan / Recommendation Clinical Impression  Pt was seen briefly in the room with family member and CNA (who provides 4 hrs of care daily when pt is at home). Pt consumed thin liquids with no overt s/sx of aspiration. She however reports "not eating" much of anything because she "can't chew without her dentures", family will be bringing her dentures later today reportedly. Pt will be transferred to [facility] later this date and was educated that nursing can downgrade her diet in the case that she continues to experience difficulty with mastication over the weekend. Aspiration precautions were reviewed and the importance of PO intake was also stressed to decrease nutritional risk. Pt will benefit from ST follow up at skilled facility to ensure solid diet tolerance.   HPI HPI: Pt is an 82 year old woman presented with confusion, found to have profound hypoxia, respiratory acidosis, acute encephalopathy, possible pneumonia, possible sepsis. Admitted for respiratory failure, sepsis, pneumonia, started on empiric antibiotics, BiPAP. Failed to improve, on 6/6 Dr. Jerilee Hoh discussed with family and the decision was made to proceed with comfort measures, avoid intubation and BiPAP. Chest x ray revealed increasing upper lobe opacities, right greater than left. ST to evaluate swallow function.       SLP Plan  Continue with current plan of care     Recommendations  Diet recommendations: Dysphagia 2 (fine chop);Thin liquid            Plan: Continue with current plan of care     Damonie [place] H. Roddie Mc, CCC-SLP Speech Language Pathologist    Wende Bushy 12/22/2015, 5:06 PM

## 2015-12-22 NOTE — Progress Notes (Signed)
PROGRESS NOTE  [name] NLG:[id number] DOB: 1929/02/17 DOA: 12/18/2015 PCP: [name], MD  Brief Narrative: 82 year old woman presented with confusion, found to have profound hypoxia, respiratory acidosis, acute encephalopathy, possible pneumonia, possible sepsis. Admitted for respiratory failure, sepsis, pneumonia, started on empiric antibiotics, BiPAP. Failed to improve, on 6/6 Dr. Jerilee Hoh discussed with family and the decision was made to proceed with comfort measures, avoid intubation and BiPAP.  Assessment/Plan: 1. Acute hypoxic respiratory failure, acute hypercapnic respiratory failure, acute respiratory acidosis secondary to pneumonia superimposed on baseline COPD. has recovered spontaneously. Initially failed BiPAP and plans were made for comfort care but spontaneously improved and is doing well. Stable on nasal cannula. 2. Sepsis secondary to pneumonia, stable. 3. Acute encephalopathy, resolved. Secondary to acute respiratory acidosis. 4. Acute kidney injury, resolved with IV fluids 5. COPD, stable 6. Hypokalemia  7. Mild oral dysphagia. Dysphagia 2 diet, thin liquids. 8. Chronic fibrocavitary lung disease with negative evaluation for recurrent TB last year.  9. Status post colostomy. Abdominal exam benign. Stool in bag. Suspect mucoid discharge from rectum.   Remarkably improved.   Replete potassium  Complete oral antibiotics.  Plan for discharge to SNF today.   DVT prophylaxis: Lovnox Code Status: DNR  Family Communication: Niece bedside. Disposition Plan: Discharge SNF  Murray Hodgkins, MD  [facility] Direct contact: [phone] --Via amion app OR  --[email or web address]; password [email or web address]  7PM-7AM contact night coverage as above 12/22/2015, 8:14 AM  LOS: 4 days   Consultants:  PT- SNF on discharge   Procedures:    Antimicrobials:  Ceftriaxone 6/5  Ceftin 6/7 >> 6/15  Zithromax 6/5, 6/7 >>6/12  HPI/Subjective: Breathing and coughing have  improved. Swelling in hand has improved. Eating small amounts of food. Noted to do better with soft diet. Mild headache. Although she feels, still has moderate weakness. No abdominal pain. Per nurse noted to have smear on rectum this morning.   Objective: Filed Vitals:   12/21/15 1300 12/21/15 1544 12/21/15 2253 12/22/15 0534  BP:  158/68 154/68 145/69  Pulse:  108 117 91  Temp:  98.9 F (37.2 C) 99.7 F (37.6 C) 98 F (36.7 C)  TempSrc:  Oral Oral Oral  Resp:  '20 20 20  '$ Height:      Weight:      SpO2: 94% 95% 63% 98%    Intake/Output Summary (Last 24 hours) at 12/22/15 0814 Last data filed at 12/21/15 2330  Gross per 24 hour  Intake    664 ml  Output   2300 ml  Net  -1636 ml     Filed Weights   12/19/15 1716 12/20/15 0546 12/21/15 0601  Weight: 35.381 kg (78 lb) 38 kg (83 lb 12.4 oz) 37.1 kg (81 lb 12.7 oz)    Exam:    Constitutional:  . Appears comfortable, calm. Alert. ENMT:  . grossly normal hearing  Respiratory:  . Inspiratory crackles bilaterally, Good air movement. No rales or rhonchi  . Respiratory effort normal. Speaks in full sentences. Musculoskeletal   Swelling in right hand has decreased.  Cardiovascular:  . RRR, no m/r/g . No LE extremity edema   Abdomen. Colostomy bag in place. Soft, nontender, nondistended. Psychiatric:  . judgement and insight appear normal  I have personally reviewed following labs and imaging studies:  Potassium 2.8  Renal function has normalized.  Scheduled Meds: . antiseptic oral rinse  7 mL Mouth Rinse q12n4p  . azithromycin  250 mg Oral Daily  . cefUROXime  500 mg Oral BID WC  .  chlorhexidine  15 mL Mouth Rinse BID  . enoxaparin (LOVENOX) injection  30 mg Subcutaneous Q24H  . fluticasone  2 spray Each Nare Daily  . levothyroxine  25 mcg Oral QAC breakfast  . pantoprazole  40 mg Oral Daily  . potassium chloride  10 mEq Oral Daily  . sodium chloride flush  3 mL Intravenous Q12H   Continuous Infusions:     Principal Problem:   Acute respiratory failure with hypoxia (HCC) Active Problems:   Cavitary lung disease   Sepsis ([place])   CAP (community acquired pneumonia)   AKI (acute kidney injury) ([place])   Acute encephalopathy   LOS: 4 days     By signing my name below, I, Rennis Harding attest that this documentation has been prepared under the direction and in the presence of Murray Hodgkins, MD Electronically signed: Rennis Harding   12/22/2015   I personally performed the services described in this documentation. All medical record entries made by the scribe were at my direction. I have reviewed the chart and agree that the record reflects my personal performance and is accurate and complete. Murray Hodgkins, MD

## 2015-12-22 NOTE — Care Management Note (Signed)
Case Management Note  Patient Details  Name: [name] MRN: [id number] Date of Birth: 03-03-1929  Expected Discharge Date:    12/22/2015              Expected Discharge Plan:  Skilled Nursing Facility  In-House Referral:  Clinical Social Work  Discharge planning Services  CM Consult  Post Acute Care Choice:  NA Choice offered to:  NA  DME Arranged:    DME Agency:     HH Arranged:    [place] Agency:     Status of Service:  Completed, signed off  Medicare Important Message Given:  Yes Date Medicare IM Given:    Medicare IM give by:    Date Additional Medicare IM Given:    Additional Medicare Important Message give by:     If discussed at [place] of Stay Meetings, dates discussed:    Additional Comments: Pt discharging to SNF today. CSW is aware and has made arrangements. No CM needs.  Sherald Barge, RN 12/22/2015, 10:37 AM

## 2015-12-22 NOTE — Progress Notes (Signed)
Gave report to Estate agent at [facility].  Vitals stable.  Colostomy pouch changed.  Colostomy site WNL.  Stoma beefy red, green liquid stool discharge.  Pt alert and oriented.  Headache pain controlled with oral tylenol.  Pt has all belongings.  Family brought upper dentures.  Pt had them in place in mouth.  Pt reports losing lower dentures at home prior to coming into hospital.  Verified with Dr. Sarajane Jews that patient is appropriate for rehab admission even with 2 BMs from rectum during this day shift.  Dr. Sarajane Jews reported that the 2 BM would not deter the pt from rehab admission.  The 2 BM during today's shift were brown, mucousy, tablespoon size BMs.  Pt transferred to [facility] via hospital bed with RN and NT at bedside.  Family at rehab facility.  No other concerns at this time.  Iantha Fallen RN 12/22/2015 1800

## 2015-12-22 NOTE — Progress Notes (Signed)
Pt had smear of BM at rectum.  Colostomy is working WNL with green liquid stool, bag in place and sealed WNL.  Pt reports not experiencing a BM from rectum since her colostomy placed at least 10 years ago.  Dr. Sarajane Jews notified via text page.  Iantha Fallen RN 11:13 AM 12/22/2015

## 2015-12-23 ENCOUNTER — Other Ambulatory Visit (HOSPITAL_COMMUNITY)
Admission: RE | Admit: 2015-12-23 | Discharge: 2015-12-23 | Disposition: A | Discharge status: Home / Self Care | Payer: Medicare Other | Source: Skilled Nursing Facility | Attending: Internal Medicine | Admitting: Internal Medicine

## 2015-12-23 DIAGNOSIS — D649 Anemia, unspecified: Secondary | ICD-10-CM | POA: Insufficient documentation

## 2015-12-23 LAB — CULTURE, BLOOD (ROUTINE X 2)
Culture: NO GROWTH
Culture: NO GROWTH

## 2015-12-23 LAB — CBC WITH DIFFERENTIAL/PLATELET
BASOS ABS: 0 10*3/uL (ref 0.0–0.1)
BASOS PCT: 0 %
EOS ABS: 0.2 10*3/uL (ref 0.0–0.7)
Eosinophils Relative: 1 %
HCT: 39.7 % (ref 36.0–46.0)
HEMOGLOBIN: 13 g/dL (ref 12.0–15.0)
Lymphocytes Relative: 12 %
Lymphs Abs: 2 10*3/uL (ref 0.7–4.0)
MCH: 31.7 pg (ref 26.0–34.0)
MCHC: 32.7 g/dL (ref 30.0–36.0)
MCV: 96.8 fL (ref 78.0–100.0)
Monocytes Absolute: 2.3 10*3/uL — ABNORMAL HIGH (ref 0.1–1.0)
Monocytes Relative: 13 %
NEUTROS PCT: 74 %
Neutro Abs: 12.7 10*3/uL — ABNORMAL HIGH (ref 1.7–7.7)
Platelets: 309 10*3/uL (ref 150–400)
RBC: 4.1 MIL/uL (ref 3.87–5.11)
RDW: 14.5 % (ref 11.5–15.5)
WBC: 17.2 10*3/uL — AB (ref 4.0–10.5)

## 2015-12-25 ENCOUNTER — Non-Acute Institutional Stay (SKILLED_NURSING_FACILITY): Payer: Medicare Other | Admitting: Internal Medicine

## 2015-12-25 ENCOUNTER — Encounter: Payer: Self-pay | Admitting: Internal Medicine

## 2015-12-25 DIAGNOSIS — J9601 Acute respiratory failure with hypoxia: Secondary | ICD-10-CM

## 2015-12-25 DIAGNOSIS — J984 Other disorders of lung: Secondary | ICD-10-CM

## 2015-12-25 DIAGNOSIS — M79672 Pain in left foot: Secondary | ICD-10-CM

## 2015-12-25 DIAGNOSIS — R627 Adult failure to thrive: Secondary | ICD-10-CM

## 2015-12-25 NOTE — Patient Instructions (Signed)
Uric acid level please. Portable film of the left foot IF pain persists or progresses.

## 2015-12-25 NOTE — Assessment & Plan Note (Signed)
Zithromax until 6/12 and cefuroxime until 6/15 Continue pulmonary toilet

## 2015-12-25 NOTE — Progress Notes (Signed)
This is a comprehensive admission note to [place] personally performed by Unice Cobble MD on this date less than 30 days from date of admission. Included are preadmission medical/surgical history;reconciled medication list; family history; social history and comprehensive review of systems.  Corrections and additions to the records were documented . Comprehensive physical exam was also performed. Additionally a clinical summary was entered for each active diagnosis pertinent to this admission in the Problem List to enhance continuity of care.  QJJ:HERDE Luking MD  HPI: She was admitted 6/5-12/22/15 with acute hypoxic respiratory failure, acute hypercapnic respiratory failure and acute respiratory acidosis the context of pneumonia with sepsis superimposed on baseline COPD and chronic fibrocavitory mycobacterial  disease. Hospital course was complicated by encephalopathy superimposed on baseline dementia. The patient received BiPAP without significant improvement initially. Comfort care was initiated; but she remained stable. Conservative approach with oral antibiotics was recommended. She was discharged on Zithromax to be continued until 6/12 and cefuroxime which would be continued until 6/15. Acute kidney injury resolved with IV fluids. Short-term rehabilitation at [facility] recommended   Past medical and surgical history:Past history of tuberculosis; evaluation was negative for recurrence in 2016. She's had a colostomy but is unable to give me a reason She had a partial gastrectomy in the mid 60s for bleeding ulcers. She has irritable bowel syndrome as well as reflux. She's also treated for generalized anxiety disorder.  Social history: Verified as correct  Family history: No new data  Comprehensive review of systems: In the last few days she's had pain in the left foot has responded to Tylenol. She denies a history of gout. She remains weak but states that she is improving  overall. She is followed by her primary care physician for chronic headaches. She has intermittent dysphagia. She has mild extrinsic symptoms of itchy, watery eyes as well as some sneezing.  She denies any signs of active upper or lower respiratory tract infection Constitutional: No fever  Eyes: No redness, discharge, pain, vision change ENT/mouth: No nasal congestion,  purulent discharge, earache,change in hearing ,sore throat  Cardiovascular: No chest pain, palpitations,paroxysmal nocturnal dyspnea, claudication, edema  Respiratory: No cough, sputum production,hemoptysis,  significant snoring,apnea   Gastrointestinal: No heartburn,abdominal pain, nausea / vomiting,rectal bleeding, melena,change in bowels Genitourinary: No dysuria,hematuria, pyuria,  incontinence, nocturia Musculoskeletal: No joint swelling, weakness Dermatologic: No rash, pruritus, change in appearance of skin Neurologic: No dizziness,headache,syncope, seizures, numbness , tingling Endocrine: No change in hair/skin/ nails, excessive thirst, excessive hunger, excessive urination  Hematologic/lymphatic: No significant bruising, lymphadenopathy,abnormal bleeding Allergy/immunology: No urticaria, angioedema  Physical exam:  Pertinent or positive findings: She appears very frail. There is wasting of the temporal muscles and limb musculature. Eyes somewhat sunken. She has an upper plate; she does not wear the lower plate. First heart sound is increased. She has dry fibrotic rales diffusely. Colostomy is present. Posterior tibial pulses are decreased. There is bruising of the dorsum of left hand. The left foot is tender to palpation. There is no obvious swelling or change in color or temperature of the foot. She was unable to give me the date beyond June, 2017. She identified the president. General appearance:Suboptimally nourished; no acute distress , increased work of breathing is present.   Lymphatic: No lymphadenopathy  about the head, neck, axilla . Eyes: No conjunctival inflammation or lid edema is present. There is no scleral icterus. Ears:  External ear exam shows no significant lesions or deformities.   Nose:  External nasal examination shows no deformity  or inflammation. Nasal mucosa are pink and moist without lesions ,exudates Oral exam: lips and gums are healthy appearing.There is no oropharyngeal erythema or exudate . Neck:  No thyromegaly, masses, tenderness noted.    Heart:  Normal rate and regular rhythm. S2 normal without gallop, murmur, click, rub .  Abdomen:Bowel sounds are normal. Abdomen is soft and nontender with no organomegaly, hernias,masses. GU: deferred  Extremities:  No cyanosis, clubbing,edema  Neurologic exam : Strength equal  in upper & lower extremities but decreeased Balance,Rhomberg,finger to nose testing could not be completed due to clinical state Deep tendon reflexes are equal Skin: Warm & dry  No significant lesions or rash.  #1 See clinical summary under each active problem in the Problem List with associated updated therapeutic plan #2 foot pain ; R/O acute gout

## 2015-12-26 ENCOUNTER — Encounter (HOSPITAL_COMMUNITY)
Admission: RE | Admit: 2015-12-26 | Discharge: 2015-12-26 | Disposition: A | Discharge status: Home / Self Care | Payer: No Typology Code available for payment source | Source: Skilled Nursing Facility | Attending: Internal Medicine | Admitting: Internal Medicine

## 2015-12-26 DIAGNOSIS — J189 Pneumonia, unspecified organism: Secondary | ICD-10-CM | POA: Insufficient documentation

## 2015-12-26 DIAGNOSIS — E039 Hypothyroidism, unspecified: Secondary | ICD-10-CM | POA: Insufficient documentation

## 2015-12-26 DIAGNOSIS — M6281 Muscle weakness (generalized): Secondary | ICD-10-CM | POA: Diagnosis not present

## 2015-12-26 DIAGNOSIS — J9691 Respiratory failure, unspecified with hypoxia: Secondary | ICD-10-CM | POA: Insufficient documentation

## 2015-12-26 DIAGNOSIS — M818 Other osteoporosis without current pathological fracture: Secondary | ICD-10-CM | POA: Diagnosis not present

## 2015-12-26 DIAGNOSIS — M5430 Sciatica, unspecified side: Secondary | ICD-10-CM | POA: Diagnosis not present

## 2015-12-26 DIAGNOSIS — D649 Anemia, unspecified: Secondary | ICD-10-CM | POA: Insufficient documentation

## 2015-12-26 DIAGNOSIS — M1000 Idiopathic gout, unspecified site: Secondary | ICD-10-CM | POA: Diagnosis not present

## 2015-12-26 LAB — URIC ACID: URIC ACID, SERUM: 3.4 mg/dL (ref 2.3–6.6)

## 2016-01-01 ENCOUNTER — Encounter: Payer: Self-pay | Admitting: Internal Medicine

## 2016-01-01 ENCOUNTER — Non-Acute Institutional Stay (SKILLED_NURSING_FACILITY): Payer: Medicare Other | Admitting: Internal Medicine

## 2016-01-01 DIAGNOSIS — E039 Hypothyroidism, unspecified: Secondary | ICD-10-CM | POA: Diagnosis not present

## 2016-01-01 DIAGNOSIS — R634 Abnormal weight loss: Secondary | ICD-10-CM

## 2016-01-01 DIAGNOSIS — R109 Unspecified abdominal pain: Secondary | ICD-10-CM | POA: Insufficient documentation

## 2016-01-01 DIAGNOSIS — J984 Other disorders of lung: Secondary | ICD-10-CM

## 2016-01-01 NOTE — Patient Instructions (Signed)
New orders for [facility] entry.. CBC and differential  BMET  Hepatic profile TSH PCXR GI consult

## 2016-01-01 NOTE — Assessment & Plan Note (Signed)
She has had progressive weight loss since admission to [facility]. This is most likely multifactorial related to poor intake and chronic multisystems disease

## 2016-01-01 NOTE — Progress Notes (Signed)
This is a nursing facility follow up for specific acute issues of rash at  the ileostomy stoma and progressive weight loss.  Interim medical record and care since last [place] visit was updated with review of diagnostic studies and change in clinical status since last visit were documented.  HPI: Apparently the patient's husband has been cleaning the  area around the stoma with alcohol as he had done prior to her admission to [facility]. The family is concerned she may have a rash in this area. Dietary reports she is supposedly eating 25-75 percent of her meals yet she has continued to lose weight. She has dysphagia with certain foods which are dry such as [place]. She has intermittent aching abdominal discomfort can last up to 60 minutes. It is related to eating. She did have an ileoscopy 08/25/15. Exam was limited to 15 cm. Jejunal and ileal biopsies were negative  This has been a chronic problem; she's been followed by Dr.Rehman ,GI with diagnoses of gastroparesis, flatulence, and diarrhea. She's had 3/4 gastrectomy for GI bleeding in 1967. She has a history of pernicious anemia. Dr.Wert has followed her for a lung mass. Her most recent film was a portable 12/18/15 which revealed increasing upper lobe opacities, greater on the right than the left. Two-view Chest x-ray 07/25/15 revealed severe chronic lung disease with scarring and fibrotic changes with cavitary lesions. The most striking changes were within the right upper lobe. She has a past history of pulmonary tuberculosis. Despite a history of pernicious anemia her hematocrit was 39.7 and indices are normocytic and normochromic on 6/10. She did have an elevated white count of 17,200. The day of discharge from the hospital her potassium was 2.8; I see no follow-up.    Comprehensive review of systems: She has intermittent sputum production of yellow material. This encompasses several tablespoons a day. It has decreased in volume from the day  of admission to [facility]. She describes feeling favorite feverish and sweaty intermittently. She also states chilled. When she has these symptoms , she feels somewhat faint.  Eyes: No redness, discharge, pain, vision change ENT/mouth: No nasal congestion,  purulent discharge, earache,change in hearing ,sore throat  Cardiovascular: No chest pain, palpitations,paroxysmal nocturnal dyspnea, claudication, edema  Respiratory: No hemoptysis, significant snoring,apnea  Gastrointestinal: No heartburn, nausea / vomiting,ileostomy bleeding, melena Genitourinary: No dysuria,hematuria, pyuria,  incontinence, nocturia Neurologic: No dizziness,headache,syncope, seizures, numbness , tingling Endocrine: No change in hair/skin/ nails, excessive thirst, excessive hunger, excessive urination  Hematologic/lymphatic: No significant bruising, lymphadenopathy,abnormal bleeding  Physical exam:  Pertinent or positive findings: She appears profoundly cachectic with wasting of the head and limbs. Eyes appeared sunken. She has an upper plate. She is not wearing the lower plate. He has dry rhonchi and rales at the bases  and there is some faint expiratory wheezing in upper lobes. Breath sounds are decreased in the upper lobes. The ileostomy bag is partially filled with stool. There appears to be herniation of the stoma. Bowel sounds are decreased. Pedal pulses are decreased. General appearance: no acute distress or increased work of breathing is present.   Lymphatic: No lymphadenopathy about the head, neck, axilla . Eyes: No conjunctival inflammation or lid edema is present. There is no scleral icterus. Ears:  External ear exam shows no significant lesions or deformities.   Nose:  External nasal examination shows no deformity or inflammation. Nasal mucosa are pink and moist without lesions ,exudates Oral exam: lips and gums are healthy appearing.There is no oropharyngeal erythema or exudate .  Neck:  No thyromegaly, masses,  tenderness noted.    Heart:  Normal rate and regular rhythm. S1 and S2 normal without gallop, murmur, click, rub .  Abdomen: Abdomen is slightly distended but nontender with no organomegaly, hernias,masses. GU: deferred  Extremities:  No cyanosis, clubbing,edema  Neurologic exam : Strength equal  in upper & lower extremities but severely reduced Balance,Rhomberg,finger to nose testing could not be completed due to clinical state Deep tendon reflexes are equal Skin: Warm & dry w/o tenting. No significant lesions or rash.    See summary under each active problem in the Problem List with associated updated therapeutic plan

## 2016-01-01 NOTE — Assessment & Plan Note (Addendum)
She has chronic fibrocavitary disease with possible upper lobe proegression based on portable chest x-ray 12/18/15 She states that her sputum production is improving Repeat portable chest x-ray

## 2016-01-01 NOTE — Assessment & Plan Note (Signed)
GI consultation with Dr. Laural Golden

## 2016-01-01 NOTE — Assessment & Plan Note (Signed)
Check TSH although iatrogenicc hyperthyroidism is not suspect

## 2016-01-02 ENCOUNTER — Ambulatory Visit (INDEPENDENT_AMBULATORY_CARE_PROVIDER_SITE_OTHER): Payer: Medicare Other | Admitting: Internal Medicine

## 2016-01-02 ENCOUNTER — Encounter (HOSPITAL_COMMUNITY)
Admission: RE | Admit: 2016-01-02 | Discharge: 2016-01-02 | Disposition: A | Discharge status: Home / Self Care | Payer: No Typology Code available for payment source | Source: Skilled Nursing Facility | Attending: *Deleted | Admitting: *Deleted

## 2016-01-02 ENCOUNTER — Encounter (INDEPENDENT_AMBULATORY_CARE_PROVIDER_SITE_OTHER): Payer: Self-pay | Admitting: Internal Medicine

## 2016-01-02 VITALS — BP 90/72 | HR 64 | Temp 98.1°F | Resp 16 | Ht 59.0 in | Wt 71.6 lb

## 2016-01-02 DIAGNOSIS — K9403 Colostomy malfunction: Secondary | ICD-10-CM

## 2016-01-02 DIAGNOSIS — R634 Abnormal weight loss: Secondary | ICD-10-CM | POA: Diagnosis not present

## 2016-01-02 DIAGNOSIS — M6281 Muscle weakness (generalized): Secondary | ICD-10-CM | POA: Diagnosis not present

## 2016-01-02 DIAGNOSIS — M5430 Sciatica, unspecified side: Secondary | ICD-10-CM | POA: Diagnosis not present

## 2016-01-02 DIAGNOSIS — M818 Other osteoporosis without current pathological fracture: Secondary | ICD-10-CM | POA: Diagnosis not present

## 2016-01-02 DIAGNOSIS — J189 Pneumonia, unspecified organism: Secondary | ICD-10-CM | POA: Diagnosis not present

## 2016-01-02 DIAGNOSIS — R197 Diarrhea, unspecified: Secondary | ICD-10-CM | POA: Diagnosis not present

## 2016-01-02 DIAGNOSIS — J9691 Respiratory failure, unspecified with hypoxia: Secondary | ICD-10-CM | POA: Diagnosis not present

## 2016-01-02 DIAGNOSIS — M1000 Idiopathic gout, unspecified site: Secondary | ICD-10-CM | POA: Diagnosis not present

## 2016-01-02 LAB — HEPATIC FUNCTION PANEL
ALBUMIN: 3.1 g/dL — AB (ref 3.5–5.0)
ALT: 14 U/L (ref 14–54)
AST: 17 U/L (ref 15–41)
Alkaline Phosphatase: 91 U/L (ref 38–126)
BILIRUBIN TOTAL: 0.5 mg/dL (ref 0.3–1.2)
Bilirubin, Direct: 0.1 mg/dL (ref 0.1–0.5)
Indirect Bilirubin: 0.4 mg/dL (ref 0.3–0.9)
TOTAL PROTEIN: 7 g/dL (ref 6.5–8.1)

## 2016-01-02 LAB — CBC WITH DIFFERENTIAL/PLATELET
BASOS ABS: 0.1 10*3/uL (ref 0.0–0.1)
Basophils Relative: 1 %
Eosinophils Absolute: 0.2 10*3/uL (ref 0.0–0.7)
Eosinophils Relative: 2 %
HEMATOCRIT: 40.6 % (ref 36.0–46.0)
HEMOGLOBIN: 13.1 g/dL (ref 12.0–15.0)
LYMPHS PCT: 35 %
Lymphs Abs: 4.1 10*3/uL — ABNORMAL HIGH (ref 0.7–4.0)
MCH: 32.1 pg (ref 26.0–34.0)
MCHC: 32.3 g/dL (ref 30.0–36.0)
MCV: 99.5 fL (ref 78.0–100.0)
MONO ABS: 0.9 10*3/uL (ref 0.1–1.0)
Monocytes Relative: 8 %
NEUTROS ABS: 6.5 10*3/uL (ref 1.7–7.7)
NEUTROS PCT: 54 %
Platelets: 425 10*3/uL — ABNORMAL HIGH (ref 150–400)
RBC: 4.08 MIL/uL (ref 3.87–5.11)
RDW: 14.6 % (ref 11.5–15.5)
WBC: 11.9 10*3/uL — ABNORMAL HIGH (ref 4.0–10.5)

## 2016-01-02 LAB — BASIC METABOLIC PANEL
ANION GAP: 6 (ref 5–15)
BUN: 25 mg/dL — ABNORMAL HIGH (ref 6–20)
CALCIUM: 9.7 mg/dL (ref 8.9–10.3)
CO2: 30 mmol/L (ref 22–32)
CREATININE: 0.89 mg/dL (ref 0.44–1.00)
Chloride: 101 mmol/L (ref 101–111)
GFR, EST NON AFRICAN AMERICAN: 58 mL/min — AB (ref >60)
Glucose, Bld: 117 mg/dL — ABNORMAL HIGH (ref 65–99)
Potassium: 4.3 mmol/L (ref 3.5–5.1)
Sodium: 137 mmol/L (ref 135–145)

## 2016-01-02 LAB — TSH: TSH: 2.554 u[IU]/mL (ref 0.350–4.500)

## 2016-01-02 NOTE — Patient Instructions (Addendum)
24-hour stool collection for fecal fat to be done at nursing home. Colostomy bag needs to be changed to Convatec size 7/8. Will check weight in office when you are discharged from nursing home.

## 2016-01-03 NOTE — Progress Notes (Signed)
Presenting complaint;  Problems with colostomy, weight loss and diarrhea.  Database and Subjective:  Patient is 82 year old Caucasian female with multiple medical problems who is here for scheduled visit. She was last seen in April 2017 but was to return in August, 2017. Since she is having problems with colostomy and weight loss early appointment was requested. Patient was hospitalized at [facility] on 12/18/2015 for bilateral pneumonia. She had hypoxemia and did not respond to nasal O2 or Ventimask and line to be intubated she gradually recovered and was discharged to pain center on 12/22/2015 for rehabilitation. Patient has lost 10 pounds in about 2 weeks. She is also having problems with colostomy since she is using loss me back made by a different manufacturer. She has had problems with skin irritation and leakage at times. She is having less problem with current back. She would like to be switched to convatec that she has used for years. Her daughter states her appetite has improved as of late last week. Before that she was not eating well. She remains with loose stools but lately she's had semi-formed stool and ileostomy. She denies abdominal pain. She continues to complain of flatulence but is not as bad as it was. She noted rectal discharge during hospitalization but not anymore. She denies abdominal pain nausea or vomiting. She is also getting 2 snacks between 3 meals.   Current Medications: Outpatient Encounter Prescriptions as of 01/02/2016  Medication Sig  . acetaminophen (TYLENOL) 325 MG tablet Take 2 tablets (650 mg total) by mouth every 6 (six) hours as needed for mild pain or headache (or Fever >/= 101).  . Alpha-D-Galactosidase (BEANO PO) Take by mouth 2 (two) times daily.  . ATROVENT HFA 17 MCG/ACT inhaler 2 PUFFS FOUR TIMES DAILY.  . diazepam (VALIUM) 5 MG tablet Take 0.5 tablets (2.5 mg total) by mouth every 12 (twelve) hours as needed for anxiety.  . fluticasone (FLONASE) 50 MCG/ACT  nasal spray Place 2 sprays into both nostrils daily.  Marland Kitchen levothyroxine (SYNTHROID, LEVOTHROID) 25 MCG tablet TAKE 2 TABLETS ON MONDAY AND FRIDAY. & 1 TABLET ALL OTHER DAYS.  Marland Kitchen memantine (NAMENDA) 10 MG tablet TAKE 1 TABLET TWICE A DAY FOR DEMENTIA.  Marland Kitchen metoprolol tartrate (LOPRESSOR) 25 MG tablet TAKE 1/2 TABLET TWICE DAILY FOR HIGH BLOOD PRESSURE.  . NON FORMULARY kozy shack pudding BID between meals ( flavor pref per resident )  . OXYGEN 2L / via N/C to maintain sats 90% or greater  . pantoprazole (PROTONIX) 40 MG tablet TAKE ONE TABLET DAILY FOR ACID REFLUX.  Marland Kitchen potassium chloride (K-DUR) 10 MEQ tablet TAKE (1) TABLET BY MOUTH ONCE DAILY.  Marland Kitchen promethazine (PHENERGAN) 12.5 MG tablet Take 12.5 mg by mouth every 6 (six) hours as needed for nausea or vomiting.  . Simethicone (PHAZYME PO) Take by mouth 3 (three) times daily.  Marland Kitchen topiramate (TOPAMAX) 25 MG tablet TAKE (1) TABLET BY MOUTH ONCE DAILY.  Marland Kitchen venlafaxine XR (EFFEXOR-XR) 75 MG 24 hr capsule TAKE 1 CAPSULE ONCE DAILY WITH BREAKFAST.   No facility-administered encounter medications on file as of 01/02/2016.     Objective: Blood pressure 90/72, pulse 64, temperature 98.1 F (36.7 C), temperature source Oral, resp. rate 16, height '4\' 11"'$  (1.499 m), weight 71 lb 9.6 oz (32.478 kg). Patient is alert and extremely thin. He required assistance when moving from chair to examination table. Conjunctiva is pink. Sclera is nonicteric Oropharyngeal mucosa is normal. No neck masses or thyromegaly noted. Cardiac exam with regular rhythm normal S1 and  S2. No murmur or gallop noted. Lungs are clear to auscultation. Abdomen is flat with upper midline scar and ileostomy and left lower quadrant with semi-formed yellow stool. Abdomen is soft and nontender without organomegaly or masses. No LE edema or clubbing noted.  Labs/studies Results:   Recent Labs  01/02/16 0730  WBC 11.9*  HGB 13.1  HCT 40.6  PLT 425*    BMET   Recent Labs   01/02/16 0730  NA 137  K 4.3  CL 101  CO2 30  GLUCOSE 117*  BUN 25*  CREATININE 0.89  CALCIUM 9.7    LFT   Recent Labs  01/02/16 0730  PROT 7.0  ALBUMIN 3.1*  AST 17  ALT 14  ALKPHOS 91  BILITOT 0.5  BILIDIR 0.1  IBILI 0.4       Assessment:  #1. Colostomy problems secondary to change in type of back that she has been using. She needs to go back to using ConvaTec size 7/8. #2. Rapid weight loss. She appears to have lost 10 pounds in 2 weeks. Most of the weight loss would appear to be due to diminished oral intake and acute illness in the form of pneumonia resulting in catabolic state. Some of her weight loss could be secondary to her diarrhea which she is undergone limited workup. Reassuring to note normal H&H and LFTs. #3. Diarrhea. She has undergone reasonable workup in the past and felt to have osmotic diarrhea. Will complete workup by doing 24-hour fecal fat analysis.   Plan:  Quantitative fecal fat analysis. She will need 24-hour stool collection. Will ask staff at [facility] to change colostomy back to ConvaTec size 7/8. Will also ask staff to check room air O2 sat and let patient's niece [name] know the result. Office visit in 2 months.

## 2016-01-04 ENCOUNTER — Other Ambulatory Visit (HOSPITAL_COMMUNITY)
Admission: RE | Admit: 2016-01-04 | Discharge: 2016-01-04 | Disposition: A | Discharge status: Home / Self Care | Payer: Medicare Other | Source: Skilled Nursing Facility | Attending: Internal Medicine | Admitting: Internal Medicine

## 2016-01-04 DIAGNOSIS — R197 Diarrhea, unspecified: Secondary | ICD-10-CM | POA: Insufficient documentation

## 2016-01-05 NOTE — Consult Note (Signed)
Contacted by [facility] for assistance with obtaining the correct pouch for this patient.  Patient has been experiencing some leakage with colostomy recently since switch to different brand of ostomy pouch.  Spoke with patient's niece to verify pouch needed.   The Convatec number is [id number] Active Life Convex One-piece pre-cut drainable pouch with durahesive skin barrier.  7/8 precut.  Merced Hanners [place] RN,CWOCN [phone]

## 2016-01-08 ENCOUNTER — Ambulatory Visit (INDEPENDENT_AMBULATORY_CARE_PROVIDER_SITE_OTHER): Payer: Medicare Other | Admitting: Internal Medicine

## 2016-01-08 ENCOUNTER — Telehealth (INDEPENDENT_AMBULATORY_CARE_PROVIDER_SITE_OTHER): Payer: Self-pay | Admitting: Internal Medicine

## 2016-01-08 NOTE — Telephone Encounter (Signed)
Patient is taking Simethicone

## 2016-01-08 NOTE — Telephone Encounter (Signed)
Patient's niece, [name] presented to the office today.  She would like to know if a gas pill has been prescribed for the patient.  When she looks at the medications, she can't tell what they are for.  [phone]

## 2016-01-09 ENCOUNTER — Encounter: Payer: Self-pay | Admitting: Internal Medicine

## 2016-01-09 ENCOUNTER — Non-Acute Institutional Stay (SKILLED_NURSING_FACILITY): Payer: Medicare Other | Admitting: Internal Medicine

## 2016-01-09 ENCOUNTER — Other Ambulatory Visit: Payer: Self-pay | Admitting: *Deleted

## 2016-01-09 DIAGNOSIS — R634 Abnormal weight loss: Secondary | ICD-10-CM | POA: Diagnosis not present

## 2016-01-09 DIAGNOSIS — F0390 Unspecified dementia without behavioral disturbance: Secondary | ICD-10-CM

## 2016-01-09 DIAGNOSIS — J9601 Acute respiratory failure with hypoxia: Secondary | ICD-10-CM | POA: Diagnosis not present

## 2016-01-09 DIAGNOSIS — J984 Other disorders of lung: Secondary | ICD-10-CM

## 2016-01-09 LAB — MISC LABCORP TEST (SEND OUT): Labcorp test code: 1354

## 2016-01-09 MED ORDER — DIAZEPAM 5 MG PO TABS: 2.5000 mg | ORAL_TABLET | Freq: Two times a day (BID) | ORAL | Status: DC | PRN

## 2016-01-09 NOTE — Progress Notes (Signed)
Patient ID: [name], female   DOB: [birth year], 82 y.o.   MRN: [id number]   This is a discharge note.  Facility is [facility].  Level care skilled.  Chief complaint-discharge note.  History of present illness.  Patient is a pleasant 82 year old female with a complicated medical history.  She was hospitalized recently in early June with acute hypoxic respiratory failure contacts pneumonia with sepsis superimposed on baseline COPD and chronic fibrocavitary mycobacterial disease.  She received BiPAP without significant improvement initially-conservative approach with oral antibiotics was recommended she was discharged on Zithromax and cefuroxime and actually has done relatively well here.  She also has acute kidney injury that resolved with IV fluids she was here for rehabilitation.  Her stay here has been marked by significant weight loss apparently about 10 pounds-she actually eats about 50% of her meals but continued to lose weight-she has seen gastroenterology thought possibly weight loss is due to her recent acute issues with the pneumonia.  She is also had diarrhea which actually appears to be getting a bit better per GI consult this thought possibly to be osmotic diarrhea.  She has gained some strength here actually is ambulating 100 feet with a walker and supervision which is an improvement.  She continues with the colostomy bag-she does have a full-time sitter who helps with maintenance of this.  She's also been followed by pulmonology for a lung mass-most recent film showed increased right upper lobe opacities slightly increased more on the left-she has a past history of pulmonary tuberculosis.  She will be going home again will have a sitter she does need continued PT and OT for strengthening although she has improved in this regards.  She has a history of pernicious anemia hemoglobin was 13.1 on lab done on 01/02/2016 which is encouraging.  Currently she has no acute  complaints feels the diarrhea is getting a bit better her appetite has been stable she will have expedient follow-up with GI in regards to her weight loss.  Previous medical history.  Acute respiratory failure.  Sepsis secondary to pneumonia.  Acute encephalopathy resolved.  Acute kidney injury improved.  COPD.  Chronic fibro cavitary lung disease.  Gastroparesis.  Status post colostomy.   Depression.  Irritable bowel syndrome.  ? hypertension   Family medical social history as been reviewed per history and physical on 12/18/2015.  Medications.  Tylenol 650 mg every 6 hours when necessary.  Atrovent 2 puffs 4 times a day.  Beano probiotic 150 units twice a day.  Effexor X are 75 mg daily.  Flonase 2 sprays both nostrils once a day.  Synthroid alternating doses of 25 g once a day on Sunday Tuesday Wednesday Thursday Saturday and 50 g on Monday and Friday.  Lopressor 5 mg twice a day.  Namenda 10 mg twice a day.  Phenergan 12.5 mg every 6 hours when necessary.  Potassium 10 mEq daily.  Protonix 40 mg daily.  Simethicone 80 mg 3 times a day.  Topamax 25 mg every morning.  Valium 2.5 mg every 12 hours as needed.    \.  Review of systems.  Gen. he says she's feeling stronger feels the diarrhea has improved somewhat continues to say she has a pretty good appetite sitter states that her appetite has improved during her stay here.  General is not complaining of fever or chills is feeling a bit stronger although still quite weak.  Skin does not complain of rashes or itching.  Head ears eyes nose  mouth and throat does not complain of dysphagia or sore throat does have a history of chronic headache followed by primary care physician.  Respiratory is not complaining of shortness of breath or or cough does have significant history respiratory failure COPD fibrocavitary disease.  Heart does not complain of chest pain or significant lower extremity  edema.  Muscle skeletal has diffuse weakness does not complain of joint pain currently has gained some strength.  Neurologic does not complain dizziness headache or syncopal-type feelings.  Psych does have a history of depression but this appears to be stable appears to be in good spirits looking forward to going home.  Physical exam.  Temperature is 98.4 pulse 65 respirations 18 blood pressure 140/68.  In general this is a very frail elderly female in no distress she is bright and alert.  Her skin is warm and dry she has some wasting of the temporal muscles and limb muscle skeletal worker which is chronic.  Eyes pupils appear reactive visual acuity appears grossly intact.  Oropharynx clear mucous membranes moist she has an upper plate.  Chest shallow air entry but I could not appreciate any labored breathing or congestion does have a history of Rales diffusely these appear to be fairly minimal this evening.  Heart is regular rate and rhythm without murmur gallop or rub.  Abdomen is soft slightly protuberant nontender there is a colostomy bag in place with a moderate amount of well-formed stool.  Musculoskeletal is able to move all extremities 4 with general frailty is now ambulatory can walk apparently 100 feet with a nadir and supervision which is an improvement.  Neurologic is grossly intact her speech is clear no lateralizing findings.  Psych she i grossly alert and oriented pleasant and appropriate appear to give a fairly accurate history.-Suspect her dementia is mild-to-moderate  Labs.  01/02/2016.  WBC 11.9 hemoglobin 13.1 platelets 425.  TSH-2.554.  Sodium 137 potassium 4.3 BUN 25 creatinine 0.9.  Albumin 3.1 otherwise liver function tests within normal limits   Assessment and plan.  #1-history of acute respiratory failure with hypoxia she appears to have made a decent recovery here she is followed by pulmonology as needed does have a history of COPD and  fibrocavitary disease which they are following-she will need expedient follow-up as an outpatient but appears to be doing relatively well in this regards. She is on Atrovent nebulizers 4 times a day.    #2 history of weight loss again this is followed closely by GI she will have expedient follow-up she appears to be eating fairly well-and actually has gained strength.--It is thought recent illnesses may have contributed to the weight loss  #3 history of dementia this appears to be mild-to-moderate she is on Namenda as she appears have done well with supportive care she does have a Technical brewer.  #4-history hypothyroidism she is on supplementation recent TSH was within normal limits at 2.554.  #5 hypertension this appears stable she is on metoprolol 12.5 mg twice a day recent blood pressures 140/68-124/70-122/78.  Again patient will need continued PT and OT once she goes home for further strengthening she has made progress here-she does have expedient follow-up with GI-she will need oxygen at 2 L a minute secondary to chronic oxygen dependency with a history of respiratory failure and COPD.  Will update a CBC and metabolic panel before discharge  [id number]-of note greater than 30 minutes spent on this discharge summary-greater than 50% of time spent coordinating plan of care for numerous diagnoses

## 2016-01-10 ENCOUNTER — Encounter (HOSPITAL_COMMUNITY)
Admission: RE | Admit: 2016-01-10 | Discharge: 2016-01-10 | Disposition: A | Discharge status: Home / Self Care | Payer: No Typology Code available for payment source | Source: Ambulatory Visit | Attending: Family Medicine | Admitting: Family Medicine

## 2016-01-10 ENCOUNTER — Encounter (HOSPITAL_COMMUNITY): Payer: No Typology Code available for payment source

## 2016-01-10 ENCOUNTER — Encounter (HOSPITAL_COMMUNITY)
Admission: RE | Admit: 2016-01-10 | Discharge: 2016-01-10 | Disposition: A | Discharge status: Home / Self Care | Payer: No Typology Code available for payment source | Source: Skilled Nursing Facility | Attending: *Deleted | Admitting: *Deleted

## 2016-01-10 DIAGNOSIS — M6281 Muscle weakness (generalized): Secondary | ICD-10-CM | POA: Diagnosis not present

## 2016-01-10 DIAGNOSIS — M818 Other osteoporosis without current pathological fracture: Secondary | ICD-10-CM | POA: Diagnosis not present

## 2016-01-10 DIAGNOSIS — M5430 Sciatica, unspecified side: Secondary | ICD-10-CM | POA: Diagnosis not present

## 2016-01-10 DIAGNOSIS — J9691 Respiratory failure, unspecified with hypoxia: Secondary | ICD-10-CM | POA: Diagnosis not present

## 2016-01-10 DIAGNOSIS — M1000 Idiopathic gout, unspecified site: Secondary | ICD-10-CM | POA: Diagnosis not present

## 2016-01-10 DIAGNOSIS — J189 Pneumonia, unspecified organism: Secondary | ICD-10-CM | POA: Diagnosis not present

## 2016-01-10 LAB — BASIC METABOLIC PANEL
Anion gap: 3 — ABNORMAL LOW (ref 5–15)
BUN: 26 mg/dL — AB (ref 6–20)
CALCIUM: 9.3 mg/dL (ref 8.9–10.3)
CO2: 27 mmol/L (ref 22–32)
CREATININE: 0.84 mg/dL (ref 0.44–1.00)
Chloride: 110 mmol/L (ref 101–111)
GFR calc non Af Amer: >60 mL/min (ref >60)
Glucose, Bld: 121 mg/dL — ABNORMAL HIGH (ref 65–99)
Potassium: 4.4 mmol/L (ref 3.5–5.1)
SODIUM: 140 mmol/L (ref 135–145)

## 2016-01-10 LAB — CBC WITH DIFFERENTIAL/PLATELET
BASOS PCT: 1 %
Basophils Absolute: 0.1 10*3/uL (ref 0.0–0.1)
EOS ABS: 0.3 10*3/uL (ref 0.0–0.7)
Eosinophils Relative: 4 %
HCT: 38.9 % (ref 36.0–46.0)
Hemoglobin: 12.5 g/dL (ref 12.0–15.0)
LYMPHS ABS: 2.4 10*3/uL (ref 0.7–4.0)
Lymphocytes Relative: 31 %
MCH: 31.5 pg (ref 26.0–34.0)
MCHC: 32.1 g/dL (ref 30.0–36.0)
MCV: 98 fL (ref 78.0–100.0)
MONO ABS: 0.7 10*3/uL (ref 0.1–1.0)
MONOS PCT: 9 %
Neutro Abs: 4.3 10*3/uL (ref 1.7–7.7)
Neutrophils Relative %: 55 %
Platelets: 406 10*3/uL — ABNORMAL HIGH (ref 150–400)
RBC: 3.97 MIL/uL (ref 3.87–5.11)
RDW: 15.3 % (ref 11.5–15.5)
WBC: 7.7 10*3/uL (ref 4.0–10.5)

## 2016-01-12 ENCOUNTER — Telehealth (INDEPENDENT_AMBULATORY_CARE_PROVIDER_SITE_OTHER): Payer: Self-pay | Admitting: *Deleted

## 2016-01-12 NOTE — Telephone Encounter (Signed)
Patient presented to the office today with her niece, Nile Dear. She was for a weight check and I was told that she was going home from [place] center. Her weight today was 70.8 lbs.  [name] is asking for a list of foods that she cannot eat and ask that it be on our letter head, so that the patient will know it came from [facility]. Or at the time of her OV with Dr.Rehman he can tell her.  I told her that it would be July 5 th or after before I could address this with him.

## 2016-01-14 DIAGNOSIS — Z9981 Dependence on supplemental oxygen: Secondary | ICD-10-CM | POA: Diagnosis not present

## 2016-01-14 DIAGNOSIS — Z433 Encounter for attention to colostomy: Secondary | ICD-10-CM | POA: Diagnosis not present

## 2016-01-14 DIAGNOSIS — J449 Chronic obstructive pulmonary disease, unspecified: Secondary | ICD-10-CM | POA: Diagnosis not present

## 2016-01-14 DIAGNOSIS — M109 Gout, unspecified: Secondary | ICD-10-CM | POA: Diagnosis not present

## 2016-01-14 DIAGNOSIS — J441 Chronic obstructive pulmonary disease with (acute) exacerbation: Secondary | ICD-10-CM | POA: Diagnosis not present

## 2016-01-14 DIAGNOSIS — K3184 Gastroparesis: Secondary | ICD-10-CM | POA: Diagnosis not present

## 2016-01-14 DIAGNOSIS — Z8701 Personal history of pneumonia (recurrent): Secondary | ICD-10-CM | POA: Diagnosis not present

## 2016-01-14 DIAGNOSIS — A310 Pulmonary mycobacterial infection: Secondary | ICD-10-CM | POA: Diagnosis not present

## 2016-01-15 NOTE — Telephone Encounter (Signed)
Per Dr.Rehman the patient can eat. We do not have a list that she cannot eat. Foods that would cause the diarrhea should be avoided but this would be by trail to see what they are. [name] will be made aware.

## 2016-01-16 DIAGNOSIS — Z433 Encounter for attention to colostomy: Secondary | ICD-10-CM | POA: Diagnosis not present

## 2016-01-16 DIAGNOSIS — A310 Pulmonary mycobacterial infection: Secondary | ICD-10-CM | POA: Diagnosis not present

## 2016-01-16 DIAGNOSIS — J441 Chronic obstructive pulmonary disease with (acute) exacerbation: Secondary | ICD-10-CM | POA: Diagnosis not present

## 2016-01-16 DIAGNOSIS — J449 Chronic obstructive pulmonary disease, unspecified: Secondary | ICD-10-CM | POA: Diagnosis not present

## 2016-01-16 DIAGNOSIS — K3184 Gastroparesis: Secondary | ICD-10-CM | POA: Diagnosis not present

## 2016-01-16 DIAGNOSIS — M109 Gout, unspecified: Secondary | ICD-10-CM | POA: Diagnosis not present

## 2016-01-17 ENCOUNTER — Other Ambulatory Visit: Payer: Self-pay | Admitting: Family Medicine

## 2016-01-17 ENCOUNTER — Telehealth (INDEPENDENT_AMBULATORY_CARE_PROVIDER_SITE_OTHER): Payer: Self-pay | Admitting: *Deleted

## 2016-01-17 DIAGNOSIS — R197 Diarrhea, unspecified: Secondary | ICD-10-CM

## 2016-01-17 NOTE — Telephone Encounter (Signed)
May prove each time 6

## 2016-01-17 NOTE — Telephone Encounter (Signed)
May we refill Celexa.

## 2016-01-17 NOTE — Telephone Encounter (Signed)
Per Dr.Rehman the patient will need to have labs drawn. 

## 2016-01-18 DIAGNOSIS — M109 Gout, unspecified: Secondary | ICD-10-CM | POA: Diagnosis not present

## 2016-01-18 DIAGNOSIS — Z433 Encounter for attention to colostomy: Secondary | ICD-10-CM | POA: Diagnosis not present

## 2016-01-18 DIAGNOSIS — A310 Pulmonary mycobacterial infection: Secondary | ICD-10-CM | POA: Diagnosis not present

## 2016-01-18 DIAGNOSIS — J449 Chronic obstructive pulmonary disease, unspecified: Secondary | ICD-10-CM | POA: Diagnosis not present

## 2016-01-18 DIAGNOSIS — J441 Chronic obstructive pulmonary disease with (acute) exacerbation: Secondary | ICD-10-CM | POA: Diagnosis not present

## 2016-01-18 DIAGNOSIS — K3184 Gastroparesis: Secondary | ICD-10-CM | POA: Diagnosis not present

## 2016-01-22 ENCOUNTER — Other Ambulatory Visit (HOSPITAL_COMMUNITY)
Admission: RE | Admit: 2016-01-22 | Discharge: 2016-01-22 | Disposition: A | Discharge status: Home / Self Care | Payer: Medicare Other | Source: Other Acute Inpatient Hospital | Attending: Family Medicine | Admitting: Family Medicine

## 2016-01-22 ENCOUNTER — Telehealth: Payer: Self-pay | Admitting: *Deleted

## 2016-01-22 DIAGNOSIS — J449 Chronic obstructive pulmonary disease, unspecified: Secondary | ICD-10-CM | POA: Diagnosis not present

## 2016-01-22 DIAGNOSIS — Z433 Encounter for attention to colostomy: Secondary | ICD-10-CM | POA: Diagnosis not present

## 2016-01-22 DIAGNOSIS — A310 Pulmonary mycobacterial infection: Secondary | ICD-10-CM | POA: Diagnosis not present

## 2016-01-22 DIAGNOSIS — N390 Urinary tract infection, site not specified: Secondary | ICD-10-CM | POA: Diagnosis not present

## 2016-01-22 DIAGNOSIS — J441 Chronic obstructive pulmonary disease with (acute) exacerbation: Secondary | ICD-10-CM | POA: Diagnosis not present

## 2016-01-22 DIAGNOSIS — K3184 Gastroparesis: Secondary | ICD-10-CM | POA: Diagnosis not present

## 2016-01-22 DIAGNOSIS — R197 Diarrhea, unspecified: Secondary | ICD-10-CM | POA: Diagnosis not present

## 2016-01-22 DIAGNOSIS — M109 Gout, unspecified: Secondary | ICD-10-CM | POA: Diagnosis not present

## 2016-01-22 LAB — URINE MICROSCOPIC-ADD ON: RBC / HPF: NONE SEEN RBC/hpf (ref 0–5)

## 2016-01-22 LAB — URINALYSIS, ROUTINE W REFLEX MICROSCOPIC
BILIRUBIN URINE: NEGATIVE
GLUCOSE, UA: NEGATIVE mg/dL
Ketones, ur: NEGATIVE mg/dL
Leukocytes, UA: NEGATIVE
Nitrite: NEGATIVE
PH: 7 (ref 5.0–8.0)
Protein, ur: NEGATIVE mg/dL
SPECIFIC GRAVITY, URINE: 1.01 (ref 1.005–1.030)

## 2016-01-22 MED ORDER — CEPHALEXIN 500 MG PO CAPS: ORAL_CAPSULE | ORAL | Status: DC

## 2016-01-22 NOTE — Telephone Encounter (Signed)
I recommend Lites 500 mg 1 3 times a day for 7 days

## 2016-01-22 NOTE — Telephone Encounter (Signed)
Spoke with patient and informed her per Dr.Scott Luking- Keflex was sent into [facility]. Medication to be delivered. Patient verbalized understanding.

## 2016-01-22 NOTE — Telephone Encounter (Signed)
Home health called to state patient has temp 99.3 and c/o burning with urination. Home health to run U/A and urine culture. Patient has appt tomm with Dr [name] at 1 pm and would like something called into [facility]-please have them deliver per patient

## 2016-01-22 NOTE — Addendum Note (Signed)
Addended by: Ofilia Neas R on: 01/22/2016 03:20 PM   Modules accepted: Orders

## 2016-01-23 ENCOUNTER — Ambulatory Visit (INDEPENDENT_AMBULATORY_CARE_PROVIDER_SITE_OTHER): Payer: Medicare Other | Admitting: Family Medicine

## 2016-01-23 ENCOUNTER — Encounter: Payer: Self-pay | Admitting: Family Medicine

## 2016-01-23 VITALS — BP 108/68 | Ht 59.0 in | Wt 72.6 lb

## 2016-01-23 DIAGNOSIS — R634 Abnormal weight loss: Secondary | ICD-10-CM

## 2016-01-23 DIAGNOSIS — J984 Other disorders of lung: Secondary | ICD-10-CM | POA: Diagnosis not present

## 2016-01-23 DIAGNOSIS — R54 Age-related physical debility: Secondary | ICD-10-CM

## 2016-01-23 NOTE — Progress Notes (Signed)
   Subjective:    Patient ID: [name], female    DOB: [birth year], 82 y.o.   MRN: [id number]  HPI  Patient arrives for a follow up after recent hospitalization. Patient is currently on keflex for uti but has no other concerns ,long discussion with the patient regarding hospitalization she was in plus what they found in addition to this long discussion regarding her weight also her chronic health conditions and how this impacts her future well-being. We did talk about end-of-life issues. Patient is not ready to make a final decision at this point we will come back again in one month's time to recheck her lungs her weight and also discuss end-of-life issues. Review of Systems She denies high fever chills sweats she currently is been evaluated for the possibility UTI is on antibiotics denies diarrhea denies wheezing difficulty breathing she relates appetite fair energy level doing better    Objective:   Physical Exam Neck no masses lungs for the most part clear but a slight amount congestion noted in the left lung heart regular extremities no edema skin warm dry patient with significant weight loss cachexia   25 minutes was spent with the patient. Greater than half the time was spent in discussion and answering questions and counseling regarding the issues that the patient came in for today.     Assessment & Plan:  Long-term prognosis guarded patient has amazingly live through multiple things. She does have a medical power of attorney but she is currently making her own decisions. Significant time was spent today discussing end-of-life issues. Currently right now she is leaning toward DO NOT RESUSCITATE but she goes back and forth regarding use of ventilator. We discussed the importance of advance directives and also the importance of trying to make her decisions known before going into the hospital. She will follow-up in 4 weeks to discuss this in detail  Frailty-guarded  prognosis  Significant weight loss this puts her on her razors edge regarding future illnesses. Encourage various ways to try to help her gain weight

## 2016-01-24 ENCOUNTER — Other Ambulatory Visit: Payer: Self-pay | Admitting: *Deleted

## 2016-01-24 DIAGNOSIS — Z433 Encounter for attention to colostomy: Secondary | ICD-10-CM | POA: Diagnosis not present

## 2016-01-24 DIAGNOSIS — A310 Pulmonary mycobacterial infection: Secondary | ICD-10-CM | POA: Diagnosis not present

## 2016-01-24 DIAGNOSIS — J441 Chronic obstructive pulmonary disease with (acute) exacerbation: Secondary | ICD-10-CM | POA: Diagnosis not present

## 2016-01-24 DIAGNOSIS — M109 Gout, unspecified: Secondary | ICD-10-CM | POA: Diagnosis not present

## 2016-01-24 DIAGNOSIS — J449 Chronic obstructive pulmonary disease, unspecified: Secondary | ICD-10-CM | POA: Diagnosis not present

## 2016-01-24 DIAGNOSIS — K3184 Gastroparesis: Secondary | ICD-10-CM | POA: Diagnosis not present

## 2016-01-24 LAB — URINE CULTURE: Culture: 10000 — AB

## 2016-01-24 MED ORDER — AMOXICILLIN 500 MG PO CAPS: 500.0000 mg | ORAL_CAPSULE | Freq: Three times a day (TID) | ORAL | Status: DC

## 2016-01-26 DIAGNOSIS — Z433 Encounter for attention to colostomy: Secondary | ICD-10-CM | POA: Diagnosis not present

## 2016-01-26 DIAGNOSIS — M109 Gout, unspecified: Secondary | ICD-10-CM | POA: Diagnosis not present

## 2016-01-26 DIAGNOSIS — K3184 Gastroparesis: Secondary | ICD-10-CM | POA: Diagnosis not present

## 2016-01-26 DIAGNOSIS — J449 Chronic obstructive pulmonary disease, unspecified: Secondary | ICD-10-CM | POA: Diagnosis not present

## 2016-01-26 DIAGNOSIS — A310 Pulmonary mycobacterial infection: Secondary | ICD-10-CM | POA: Diagnosis not present

## 2016-01-26 DIAGNOSIS — J441 Chronic obstructive pulmonary disease with (acute) exacerbation: Secondary | ICD-10-CM | POA: Diagnosis not present

## 2016-01-28 LAB — FECAL FAT, QUANTITATIVE: FECAL LIPIDS, 24 HR: 51 g/(24.h) — AB (ref <7)

## 2016-01-29 DIAGNOSIS — A310 Pulmonary mycobacterial infection: Secondary | ICD-10-CM | POA: Diagnosis not present

## 2016-01-29 DIAGNOSIS — K3184 Gastroparesis: Secondary | ICD-10-CM | POA: Diagnosis not present

## 2016-01-29 DIAGNOSIS — J449 Chronic obstructive pulmonary disease, unspecified: Secondary | ICD-10-CM | POA: Diagnosis not present

## 2016-01-29 DIAGNOSIS — M109 Gout, unspecified: Secondary | ICD-10-CM | POA: Diagnosis not present

## 2016-01-29 DIAGNOSIS — J441 Chronic obstructive pulmonary disease with (acute) exacerbation: Secondary | ICD-10-CM | POA: Diagnosis not present

## 2016-01-29 DIAGNOSIS — Z433 Encounter for attention to colostomy: Secondary | ICD-10-CM | POA: Diagnosis not present

## 2016-01-31 ENCOUNTER — Encounter: Payer: Self-pay | Admitting: Internal Medicine

## 2016-02-01 DIAGNOSIS — A310 Pulmonary mycobacterial infection: Secondary | ICD-10-CM | POA: Diagnosis not present

## 2016-02-01 DIAGNOSIS — M109 Gout, unspecified: Secondary | ICD-10-CM | POA: Diagnosis not present

## 2016-02-01 DIAGNOSIS — J449 Chronic obstructive pulmonary disease, unspecified: Secondary | ICD-10-CM | POA: Diagnosis not present

## 2016-02-01 DIAGNOSIS — K3184 Gastroparesis: Secondary | ICD-10-CM | POA: Diagnosis not present

## 2016-02-01 DIAGNOSIS — J441 Chronic obstructive pulmonary disease with (acute) exacerbation: Secondary | ICD-10-CM | POA: Diagnosis not present

## 2016-02-01 DIAGNOSIS — Z433 Encounter for attention to colostomy: Secondary | ICD-10-CM | POA: Diagnosis not present

## 2016-02-02 DIAGNOSIS — J441 Chronic obstructive pulmonary disease with (acute) exacerbation: Secondary | ICD-10-CM | POA: Diagnosis not present

## 2016-02-02 DIAGNOSIS — J449 Chronic obstructive pulmonary disease, unspecified: Secondary | ICD-10-CM | POA: Diagnosis not present

## 2016-02-02 DIAGNOSIS — Z433 Encounter for attention to colostomy: Secondary | ICD-10-CM | POA: Diagnosis not present

## 2016-02-02 DIAGNOSIS — M109 Gout, unspecified: Secondary | ICD-10-CM | POA: Diagnosis not present

## 2016-02-02 DIAGNOSIS — A310 Pulmonary mycobacterial infection: Secondary | ICD-10-CM | POA: Diagnosis not present

## 2016-02-02 DIAGNOSIS — K3184 Gastroparesis: Secondary | ICD-10-CM | POA: Diagnosis not present

## 2016-02-05 DIAGNOSIS — J441 Chronic obstructive pulmonary disease with (acute) exacerbation: Secondary | ICD-10-CM | POA: Diagnosis not present

## 2016-02-05 DIAGNOSIS — A310 Pulmonary mycobacterial infection: Secondary | ICD-10-CM | POA: Diagnosis not present

## 2016-02-05 DIAGNOSIS — M109 Gout, unspecified: Secondary | ICD-10-CM | POA: Diagnosis not present

## 2016-02-05 DIAGNOSIS — K3184 Gastroparesis: Secondary | ICD-10-CM | POA: Diagnosis not present

## 2016-02-05 DIAGNOSIS — J449 Chronic obstructive pulmonary disease, unspecified: Secondary | ICD-10-CM | POA: Diagnosis not present

## 2016-02-05 DIAGNOSIS — Z433 Encounter for attention to colostomy: Secondary | ICD-10-CM | POA: Diagnosis not present

## 2016-02-06 ENCOUNTER — Other Ambulatory Visit (INDEPENDENT_AMBULATORY_CARE_PROVIDER_SITE_OTHER): Payer: Self-pay | Admitting: Internal Medicine

## 2016-02-06 ENCOUNTER — Encounter (INDEPENDENT_AMBULATORY_CARE_PROVIDER_SITE_OTHER): Payer: Self-pay | Admitting: Internal Medicine

## 2016-02-06 DIAGNOSIS — M109 Gout, unspecified: Secondary | ICD-10-CM | POA: Diagnosis not present

## 2016-02-06 DIAGNOSIS — J449 Chronic obstructive pulmonary disease, unspecified: Secondary | ICD-10-CM | POA: Diagnosis not present

## 2016-02-06 DIAGNOSIS — Z433 Encounter for attention to colostomy: Secondary | ICD-10-CM | POA: Diagnosis not present

## 2016-02-06 DIAGNOSIS — J441 Chronic obstructive pulmonary disease with (acute) exacerbation: Secondary | ICD-10-CM | POA: Diagnosis not present

## 2016-02-06 DIAGNOSIS — K3184 Gastroparesis: Secondary | ICD-10-CM | POA: Diagnosis not present

## 2016-02-06 DIAGNOSIS — A310 Pulmonary mycobacterial infection: Secondary | ICD-10-CM | POA: Diagnosis not present

## 2016-02-06 MED ORDER — PANCRELIPASE (LIP-PROT-AMYL) 12000-38000 UNITS PO CPEP: 24000.0000 [IU] | ORAL_CAPSULE | Freq: Three times a day (TID) | ORAL | 5 refills | Status: DC

## 2016-02-06 NOTE — Telephone Encounter (Signed)
Patient has an appointment scheduled for 03/12/16

## 2016-02-07 DIAGNOSIS — J449 Chronic obstructive pulmonary disease, unspecified: Secondary | ICD-10-CM | POA: Diagnosis not present

## 2016-02-07 DIAGNOSIS — M109 Gout, unspecified: Secondary | ICD-10-CM | POA: Diagnosis not present

## 2016-02-07 DIAGNOSIS — A310 Pulmonary mycobacterial infection: Secondary | ICD-10-CM | POA: Diagnosis not present

## 2016-02-07 DIAGNOSIS — K3184 Gastroparesis: Secondary | ICD-10-CM | POA: Diagnosis not present

## 2016-02-07 DIAGNOSIS — Z433 Encounter for attention to colostomy: Secondary | ICD-10-CM | POA: Diagnosis not present

## 2016-02-07 DIAGNOSIS — J441 Chronic obstructive pulmonary disease with (acute) exacerbation: Secondary | ICD-10-CM | POA: Diagnosis not present

## 2016-02-08 DIAGNOSIS — K3184 Gastroparesis: Secondary | ICD-10-CM | POA: Diagnosis not present

## 2016-02-08 DIAGNOSIS — M109 Gout, unspecified: Secondary | ICD-10-CM | POA: Diagnosis not present

## 2016-02-08 DIAGNOSIS — Z433 Encounter for attention to colostomy: Secondary | ICD-10-CM | POA: Diagnosis not present

## 2016-02-08 DIAGNOSIS — J441 Chronic obstructive pulmonary disease with (acute) exacerbation: Secondary | ICD-10-CM | POA: Diagnosis not present

## 2016-02-08 DIAGNOSIS — J449 Chronic obstructive pulmonary disease, unspecified: Secondary | ICD-10-CM | POA: Diagnosis not present

## 2016-02-08 DIAGNOSIS — A310 Pulmonary mycobacterial infection: Secondary | ICD-10-CM | POA: Diagnosis not present

## 2016-02-13 DIAGNOSIS — J441 Chronic obstructive pulmonary disease with (acute) exacerbation: Secondary | ICD-10-CM | POA: Diagnosis not present

## 2016-02-13 DIAGNOSIS — J449 Chronic obstructive pulmonary disease, unspecified: Secondary | ICD-10-CM | POA: Diagnosis not present

## 2016-02-13 DIAGNOSIS — A310 Pulmonary mycobacterial infection: Secondary | ICD-10-CM | POA: Diagnosis not present

## 2016-02-13 DIAGNOSIS — M109 Gout, unspecified: Secondary | ICD-10-CM | POA: Diagnosis not present

## 2016-02-13 DIAGNOSIS — Z433 Encounter for attention to colostomy: Secondary | ICD-10-CM | POA: Diagnosis not present

## 2016-02-13 DIAGNOSIS — K3184 Gastroparesis: Secondary | ICD-10-CM | POA: Diagnosis not present

## 2016-02-14 ENCOUNTER — Other Ambulatory Visit: Payer: Self-pay | Admitting: Family Medicine

## 2016-02-14 ENCOUNTER — Ambulatory Visit: Payer: Medicare Other | Admitting: Family Medicine

## 2016-02-21 ENCOUNTER — Ambulatory Visit (INDEPENDENT_AMBULATORY_CARE_PROVIDER_SITE_OTHER): Payer: Medicare Other | Admitting: Family Medicine

## 2016-02-21 DIAGNOSIS — R54 Age-related physical debility: Secondary | ICD-10-CM

## 2016-02-21 DIAGNOSIS — J984 Other disorders of lung: Secondary | ICD-10-CM

## 2016-02-21 DIAGNOSIS — R634 Abnormal weight loss: Secondary | ICD-10-CM

## 2016-02-21 DIAGNOSIS — R197 Diarrhea, unspecified: Secondary | ICD-10-CM

## 2016-02-21 NOTE — Progress Notes (Signed)
   Subjective:    Patient ID: [name], female    DOB: 1929/03/22, 82 y.o.   MRN: [id number]  HPI  Patient arrives for a follow up on recent hospitalization . Patient states she is having problems with gas and thinks she may need meds Patient relates she has a lot of gas a lot of intermittent loose stools she eats a fair amount of food but is still been losing weight. There is concern about the possibility of bacterial overgrowth she denies high fever chills sweats she denies bleeding through her ostomy   We also had a long discussion regarding her pulmonary status risk of infection plus also frailty and the risk of demise related to infection illnesses or even dying of old age  63 minutes was spent with the patient. Greater than half the time was spent in discussion and answering questions and counseling regarding the issues that the patient came in for today.   Review of Systems See above    Objective:   Physical Exam There are crackles on the left side heart is regular extremities no edema skin warm dry       Assessment & Plan:  Long-term prognosis very guarded Persistent left lung congestion at high risk for infection of high fevers difficulty breathing or worse immediately come here or go to ER  Apparently patient has been taking some of her husbands pain medicine for her headaches family was told not to have her take any pain medicine other than Tylenol  If she is still having drowsiness with her Valium then I would recommend we reduce the Valium to a new dose of 2 mg twice a day (it should be noted that this patient has been on Valium for years. She is was on Valium well before we ever took care of her. Patient did not seem drowsy or dizzy or ataxia during our visit)  Low residue diet recommend follow-up within 2 months

## 2016-02-23 DIAGNOSIS — A310 Pulmonary mycobacterial infection: Secondary | ICD-10-CM | POA: Diagnosis not present

## 2016-02-23 DIAGNOSIS — M109 Gout, unspecified: Secondary | ICD-10-CM | POA: Diagnosis not present

## 2016-02-23 DIAGNOSIS — J441 Chronic obstructive pulmonary disease with (acute) exacerbation: Secondary | ICD-10-CM | POA: Diagnosis not present

## 2016-02-23 DIAGNOSIS — K3184 Gastroparesis: Secondary | ICD-10-CM | POA: Diagnosis not present

## 2016-02-23 DIAGNOSIS — J449 Chronic obstructive pulmonary disease, unspecified: Secondary | ICD-10-CM | POA: Diagnosis not present

## 2016-02-23 DIAGNOSIS — Z433 Encounter for attention to colostomy: Secondary | ICD-10-CM | POA: Diagnosis not present

## 2016-03-01 DIAGNOSIS — A310 Pulmonary mycobacterial infection: Secondary | ICD-10-CM | POA: Diagnosis not present

## 2016-03-01 DIAGNOSIS — K3184 Gastroparesis: Secondary | ICD-10-CM | POA: Diagnosis not present

## 2016-03-01 DIAGNOSIS — J449 Chronic obstructive pulmonary disease, unspecified: Secondary | ICD-10-CM | POA: Diagnosis not present

## 2016-03-01 DIAGNOSIS — J441 Chronic obstructive pulmonary disease with (acute) exacerbation: Secondary | ICD-10-CM | POA: Diagnosis not present

## 2016-03-01 DIAGNOSIS — M109 Gout, unspecified: Secondary | ICD-10-CM | POA: Diagnosis not present

## 2016-03-01 DIAGNOSIS — Z433 Encounter for attention to colostomy: Secondary | ICD-10-CM | POA: Diagnosis not present

## 2016-03-06 ENCOUNTER — Telehealth: Payer: Self-pay | Admitting: Family Medicine

## 2016-03-06 ENCOUNTER — Other Ambulatory Visit: Payer: Self-pay | Admitting: *Deleted

## 2016-03-06 NOTE — Telephone Encounter (Signed)
Pt is needing a refill on her diazepam (VALIUM) 5 MG tablet    [facility] - [place], [place] - [address]

## 2016-03-07 ENCOUNTER — Telehealth (INDEPENDENT_AMBULATORY_CARE_PROVIDER_SITE_OTHER): Payer: Self-pay | Admitting: Internal Medicine

## 2016-03-07 ENCOUNTER — Telehealth: Payer: Self-pay | Admitting: Family Medicine

## 2016-03-07 NOTE — Telephone Encounter (Signed)
[name], I saw your last note. I did not want to prescribe medication for diarrhea. We told patient you would be back in Friday to review message. Thanks.

## 2016-03-07 NOTE — Telephone Encounter (Signed)
Patient's caregiver, Glennon Hamilton called and stated that the patient can not keep anything down and has diarrhea.  She stated that something needs to be called to [facility].  [phone]

## 2016-03-07 NOTE — Telephone Encounter (Signed)
Spoke with patient and patient stated that she hasn't had any fevers, no abdominal pain. Patient has an illeostomy bag and states she has to empty it several times. Patient states she has been drinking liquids. Please advise?

## 2016-03-07 NOTE — Telephone Encounter (Signed)
Valium 5 mg- use 1/2 bid prn-#30- 4 refills, this med needs to last 30 days

## 2016-03-07 NOTE — Telephone Encounter (Signed)
Patient has diarrhea really bad.  She has had diarrhea all week.  Wants to know if we can call something in ASAP.   [place]

## 2016-03-07 NOTE — Telephone Encounter (Signed)
[place] to get more info

## 2016-03-07 NOTE — Telephone Encounter (Signed)
Per Dr.Rehman the patient may try Phenergan 12.5 mg one po BID #12. Imodium OTC 2 mg -take 1 by mouth three times a day for 2 days then on an as needed basis. This was called to [name].

## 2016-03-07 NOTE — Telephone Encounter (Signed)
I recommend that she uses over-the-counter Imodium one or 2 tablets every 6 hours as needed for diarrhea-her caretaker should be a month to get some from [place] or [place] etc. (her niece [name] could also pick this up for her)

## 2016-03-08 DIAGNOSIS — A310 Pulmonary mycobacterial infection: Secondary | ICD-10-CM | POA: Diagnosis not present

## 2016-03-08 DIAGNOSIS — K3184 Gastroparesis: Secondary | ICD-10-CM | POA: Diagnosis not present

## 2016-03-08 DIAGNOSIS — J449 Chronic obstructive pulmonary disease, unspecified: Secondary | ICD-10-CM | POA: Diagnosis not present

## 2016-03-08 DIAGNOSIS — Z433 Encounter for attention to colostomy: Secondary | ICD-10-CM | POA: Diagnosis not present

## 2016-03-08 DIAGNOSIS — M109 Gout, unspecified: Secondary | ICD-10-CM | POA: Diagnosis not present

## 2016-03-08 DIAGNOSIS — J441 Chronic obstructive pulmonary disease with (acute) exacerbation: Secondary | ICD-10-CM | POA: Diagnosis not present

## 2016-03-08 MED ORDER — DIAZEPAM 5 MG PO TABS: ORAL_TABLET | ORAL | 4 refills | Status: DC

## 2016-03-08 NOTE — Telephone Encounter (Signed)
Called and spoke with patient and informed her per [place] will be sent over on requested medication. Patient verbalized understanding. Prescription to be faxed.

## 2016-03-08 NOTE — Telephone Encounter (Signed)
Left message return call 03/08/16

## 2016-03-10 ENCOUNTER — Encounter: Payer: Self-pay | Admitting: Family Medicine

## 2016-03-11 DIAGNOSIS — M109 Gout, unspecified: Secondary | ICD-10-CM | POA: Diagnosis not present

## 2016-03-11 DIAGNOSIS — A310 Pulmonary mycobacterial infection: Secondary | ICD-10-CM | POA: Diagnosis not present

## 2016-03-11 DIAGNOSIS — K3184 Gastroparesis: Secondary | ICD-10-CM | POA: Diagnosis not present

## 2016-03-11 DIAGNOSIS — Z433 Encounter for attention to colostomy: Secondary | ICD-10-CM | POA: Diagnosis not present

## 2016-03-11 DIAGNOSIS — J441 Chronic obstructive pulmonary disease with (acute) exacerbation: Secondary | ICD-10-CM | POA: Diagnosis not present

## 2016-03-11 DIAGNOSIS — J449 Chronic obstructive pulmonary disease, unspecified: Secondary | ICD-10-CM | POA: Diagnosis not present

## 2016-03-11 NOTE — Telephone Encounter (Signed)
Patient advised that Dr Nicki Reaper recommends that she uses over-the-counter Imodium one or 2 tablets every 6 hours as needed for diarrhea-her caretaker should be able to get some from [place] or [place] etc. (her niece [name] could also pick this up for her) Patient verbalized understanding

## 2016-03-11 NOTE — Telephone Encounter (Signed)
Left message to return call 

## 2016-03-12 ENCOUNTER — Encounter (INDEPENDENT_AMBULATORY_CARE_PROVIDER_SITE_OTHER): Payer: Self-pay | Admitting: Internal Medicine

## 2016-03-12 ENCOUNTER — Ambulatory Visit (INDEPENDENT_AMBULATORY_CARE_PROVIDER_SITE_OTHER): Payer: Medicare Other | Admitting: Internal Medicine

## 2016-03-12 VITALS — BP 98/66 | HR 62 | Temp 98.0°F | Resp 18 | Ht 59.0 in | Wt 72.8 lb

## 2016-03-12 DIAGNOSIS — R634 Abnormal weight loss: Secondary | ICD-10-CM

## 2016-03-12 DIAGNOSIS — K909 Intestinal malabsorption, unspecified: Secondary | ICD-10-CM

## 2016-03-12 MED ORDER — PANCRELIPASE (LIP-PROT-AMYL) 12000-38000 UNITS PO CPEP: 24000.0000 [IU] | ORAL_CAPSULE | Freq: Three times a day (TID) | ORAL | 5 refills | Status: DC

## 2016-03-12 MED ORDER — LOPERAMIDE HCL 2 MG PO CAPS: 2.0000 mg | ORAL_CAPSULE | Freq: Every day | ORAL | Status: DC

## 2016-03-12 MED ORDER — PANCRELIPASE (LIP-PROT-AMYL) 12000-38000 UNITS PO CPEP: 24000.0000 [IU] | ORAL_CAPSULE | Freq: Three times a day (TID) | ORAL | Status: DC

## 2016-03-12 NOTE — Progress Notes (Signed)
Presenting complaint;  Follow-up for diarrhea and bloating.  Database and Subjective:  Patient is 82 year old Caucasian female who is here for scheduled visit accompanied by her niece. She was last seen on 01/02/2016. Following that visit she had quantitative fecal fat analysis. She had a high fat content in stool. Prescription were Creon was sent to her pharmacy but was never delivered. She does not feel any better. She has excessive flatus. Sometimes her back on soft. Stool consistency varies between loose to semi-formed. She denies bleeding into ileostomy or melena. She is eating 3 meals a day. Meals are small. She did have an episode of spitting of food few days ago. She has gained 1 pound since her last visit.    Current Medications: Outpatient Encounter Prescriptions as of 03/12/2016  Medication Sig  . acetaminophen (TYLENOL) 325 MG tablet Take 2 tablets (650 mg total) by mouth every 6 (six) hours as needed for mild pain or headache (or Fever >/= 101).  . ATROVENT HFA 17 MCG/ACT inhaler 2 PUFFS FOUR TIMES DAILY.  . citalopram (CELEXA) 10 MG tablet TAKE (1) TABLET BY MOUTH ONCE DAILY.  . diazepam (VALIUM) 5 MG tablet Take 0.5 tablets twice a daily as needed. Take sparingly (Prescription must last 30 days)  . Doxylamine-DM (ROBITUSSIN NIGHTTIME COUGH DM) 12.5-30 MG/10ML LIQD Take by mouth. Patient taked 1 teaspoon as needed.  . fluticasone (FLONASE) 50 MCG/ACT nasal spray Place 2 sprays into both nostrils daily.  Marland Kitchen levothyroxine (SYNTHROID, LEVOTHROID) 25 MCG tablet TAKE 2 TABLETS ON MONDAY AND FRIDAY. & 1 TABLET ALL OTHER DAYS.  Marland Kitchen loperamide (IMODIUM) 2 MG capsule Take 2 mg by mouth as needed for diarrhea or loose stools.  . Melatonin 5 MG CAPS Take 5 mg by mouth at bedtime.  . memantine (NAMENDA) 10 MG tablet TAKE (1) TABLET TWICE DAILY.  . metoprolol tartrate (LOPRESSOR) 25 MG tablet TAKE 1/2 TABLET TWICE DAILY FOR HIGH BLOOD PRESSURE.  Marland Kitchen OVER THE COUNTER MEDICATION Charco Caps - 1  capsule as needed for gas.  Marland Kitchen OVER THE COUNTER MEDICATION Extra Strength Gas Relief -  Patient states that she takes after each meal.  . OVER THE COUNTER MEDICATION [place] Nerve Tonic - patient states that she takes 1 by mouth when she is out of Valium.  . OXYGEN 2L / via N/C to maintain sats 90% or greater  . pantoprazole (PROTONIX) 40 MG tablet TAKE ONE TABLET DAILY FOR ACID REFLUX.  Marland Kitchen potassium chloride (K-DUR) 10 MEQ tablet TAKE (1) TABLET BY MOUTH ONCE DAILY.  Marland Kitchen promethazine (PHENERGAN) 12.5 MG tablet Take 12.5 mg by mouth every 6 (six) hours as needed for nausea or vomiting.  . ranitidine (ACID REDUCER) 150 MG tablet Take 150 mg by mouth as needed for heartburn.  . Simethicone (PHAZYME PO) Take by mouth 3 (three) times daily.  Marland Kitchen topiramate (TOPAMAX) 25 MG tablet TAKE (1) TABLET BY MOUTH ONCE DAILY.  Marland Kitchen venlafaxine XR (EFFEXOR-XR) 75 MG 24 hr capsule TAKE 1 CAPSULE ONCE DAILY WITH BREAKFAST.  . [DISCONTINUED] Alpha-D-Galactosidase (BEANO PO) Take by mouth 2 (two) times daily.  . [DISCONTINUED] lipase/protease/amylase (CREON) 12000 units CPEP capsule Take 2 capsules (24,000 Units total) by mouth 3 (three) times daily with meals. Take 2 capsules each meal and one with snack for total of 8 capsules per day. (Patient not taking: Reported on 03/12/2016)  . [DISCONTINUED] NON FORMULARY kozy shack pudding BID between meals ( flavor pref per resident )   No facility-administered encounter medications on file as of  03/12/2016.      Objective: Blood pressure 98/66, pulse 62, temperature 98 F (36.7 C), temperature source Oral, resp. rate 18, height '4\' 11"'$  (1.499 m), weight 72 lb 12.8 oz (33 kg). Patient is alert and in no acute distress. She is very thin and wasted. Conjunctiva is pink. Sclera is nonicteric Oropharyngeal mucosa is normal. No neck masses or thyromegaly noted. Cardiac exam with regular rhythm normal S1 and S2. No murmur or gallop noted. Lungs are clear to auscultation. Abdomen is  flat and soft. Ileostomy bag is full of gas and scant amount of soft yellowish stool. No LE edema or clubbing noted.  Labs/studies Results: Quantitative fecal fat analysis on 02/06/2016 revealed 51 g of fat per 24 hours.   Assessment:  #1. Steatorrhea most likely secondary to pancreatic insufficiency. Prescription for pancreatic enzyme was called over a month ago but apparently has not been delivered. No wonder patient is not feeling any better. #2. Weight loss secondary to above.   Plan:  Patient advised stop activated charcoal. Creon 12,002 capsules each meal and one with snack for total of 8 capsules per day. Patient or her niece will call with progress report in 3-4 weeks. Office visit in 2 months.

## 2016-03-12 NOTE — Patient Instructions (Addendum)
Take Creon 12,000, 2 capsules with each meal and one with snack. Oral daily doses 8 Stools.  Take first dose after taking a few bites and second dose in the middle of the meal. Take 1 capsule with a snack after you've eaten few bites or half a snack. Please call office with progress report in 3-4 weeks.

## 2016-03-28 ENCOUNTER — Telehealth (INDEPENDENT_AMBULATORY_CARE_PROVIDER_SITE_OTHER): Payer: Self-pay | Admitting: Internal Medicine

## 2016-03-28 NOTE — Telephone Encounter (Signed)
Patient's niece [name] presented to the office to give a progress report on the patient.  Medication is working good, still has some gas, but not at all like it was.  Stool is now has some consistency.  [name] stated if Dr. Laural Golden was to speak to the patient she would say the medication was not helping, but it is.  [name]

## 2016-03-28 NOTE — Telephone Encounter (Signed)
Dr.Rehman will be made aware. 

## 2016-04-01 NOTE — Telephone Encounter (Signed)
Per Dr.Rehman this is good. We will see her at her next OV.

## 2016-04-03 ENCOUNTER — Other Ambulatory Visit (INDEPENDENT_AMBULATORY_CARE_PROVIDER_SITE_OTHER): Payer: Self-pay | Admitting: *Deleted

## 2016-04-03 ENCOUNTER — Other Ambulatory Visit (INDEPENDENT_AMBULATORY_CARE_PROVIDER_SITE_OTHER): Payer: Self-pay | Admitting: Internal Medicine

## 2016-04-03 MED ORDER — LOPERAMIDE HCL 2 MG PO CAPS: 2.0000 mg | ORAL_CAPSULE | Freq: Every day | ORAL | 5 refills | Status: DC

## 2016-04-03 MED ORDER — PROMETHAZINE HCL 12.5 MG PO TABS: 12.5000 mg | ORAL_TABLET | Freq: Four times a day (QID) | ORAL | 0 refills | Status: DC | PRN

## 2016-04-03 NOTE — Telephone Encounter (Signed)
Patient's caregiver , [name], called. She states that the patient needs refills on Imodium and Phenergan. Per Dr.Rehman may refill. These were e-scribed to [facility].

## 2016-04-04 ENCOUNTER — Other Ambulatory Visit: Payer: Self-pay | Admitting: Family Medicine

## 2016-04-04 DIAGNOSIS — H04123 Dry eye syndrome of bilateral lacrimal glands: Secondary | ICD-10-CM | POA: Diagnosis not present

## 2016-04-04 DIAGNOSIS — H40013 Open angle with borderline findings, low risk, bilateral: Secondary | ICD-10-CM | POA: Diagnosis not present

## 2016-04-04 DIAGNOSIS — Z961 Presence of intraocular lens: Secondary | ICD-10-CM | POA: Diagnosis not present

## 2016-04-04 DIAGNOSIS — H10413 Chronic giant papillary conjunctivitis, bilateral: Secondary | ICD-10-CM | POA: Diagnosis not present

## 2016-04-23 ENCOUNTER — Ambulatory Visit (INDEPENDENT_AMBULATORY_CARE_PROVIDER_SITE_OTHER): Payer: Medicare Other | Admitting: Family Medicine

## 2016-04-23 ENCOUNTER — Encounter: Payer: Self-pay | Admitting: Family Medicine

## 2016-04-23 VITALS — BP 122/76 | Temp 98.1°F | Ht 59.0 in | Wt 75.6 lb

## 2016-04-23 DIAGNOSIS — B9689 Other specified bacterial agents as the cause of diseases classified elsewhere: Secondary | ICD-10-CM | POA: Diagnosis not present

## 2016-04-23 DIAGNOSIS — R54 Age-related physical debility: Secondary | ICD-10-CM

## 2016-04-23 DIAGNOSIS — R64 Cachexia: Secondary | ICD-10-CM | POA: Diagnosis not present

## 2016-04-23 DIAGNOSIS — J019 Acute sinusitis, unspecified: Secondary | ICD-10-CM | POA: Diagnosis not present

## 2016-04-23 DIAGNOSIS — Z23 Encounter for immunization: Secondary | ICD-10-CM | POA: Diagnosis not present

## 2016-04-23 MED ORDER — CEFPROZIL 250 MG PO TABS: 250.0000 mg | ORAL_TABLET | Freq: Two times a day (BID) | ORAL | 0 refills | Status: DC

## 2016-04-23 NOTE — Progress Notes (Signed)
   Subjective:    Patient ID: [name], female    DOB: [birth year], 82 y.o.   MRN: [id number]  HPIWeight check. Pt states she is eating more.Patient has gained a couple pounds doing better with having an assistant at her home. Patient unable to change her own colostomy bag unable to fix her own food.  Sinus drainage, cough. Wants to get an antibiotic shot. Denies fever chills sweats denies wheezing difficulty breathing.  Wants flu vaccine. Recheck 3 months    Review of Systems Congestion cough noted denies shortness of breath denies chest pain denies fever chills denies abdominal pain    Objective:   Physical Exam Lungs has rails noted on the right side which is common for this patient because of her cavitary lesion. She is not to Her hearts regular HEENT appears benign       Assessment & Plan:  Sinusitis antibiotics prescribed warning signs discussed Recheck 3 months Cachexia frailty-importance of weight gain recommended. Importance of increased dietary intake. Follow-up if progressive troubles. Cachexia related to her chronic lung disease and significant health issues

## 2016-05-01 ENCOUNTER — Other Ambulatory Visit: Payer: Self-pay | Admitting: *Deleted

## 2016-05-01 MED ORDER — DIAZEPAM 5 MG PO TABS: ORAL_TABLET | ORAL | 5 refills | Status: DC

## 2016-05-01 NOTE — Progress Notes (Signed)
rf this and 5 more

## 2016-05-08 ENCOUNTER — Other Ambulatory Visit: Payer: Self-pay | Admitting: Family Medicine

## 2016-05-08 NOTE — Telephone Encounter (Signed)
May refill each medication 6

## 2016-05-21 ENCOUNTER — Encounter (INDEPENDENT_AMBULATORY_CARE_PROVIDER_SITE_OTHER): Payer: Self-pay | Admitting: Internal Medicine

## 2016-05-21 ENCOUNTER — Ambulatory Visit (INDEPENDENT_AMBULATORY_CARE_PROVIDER_SITE_OTHER): Payer: Medicare Other | Admitting: Internal Medicine

## 2016-05-21 ENCOUNTER — Encounter (INDEPENDENT_AMBULATORY_CARE_PROVIDER_SITE_OTHER): Payer: Self-pay

## 2016-05-21 VITALS — BP 100/66 | HR 62 | Temp 97.7°F | Resp 16 | Ht 59.0 in | Wt 75.3 lb

## 2016-05-21 DIAGNOSIS — D171 Benign lipomatous neoplasm of skin and subcutaneous tissue of trunk: Secondary | ICD-10-CM

## 2016-05-21 DIAGNOSIS — K909 Intestinal malabsorption, unspecified: Secondary | ICD-10-CM

## 2016-05-21 DIAGNOSIS — R634 Abnormal weight loss: Secondary | ICD-10-CM | POA: Diagnosis not present

## 2016-05-21 NOTE — Progress Notes (Signed)
Presenting complaint;  Follow-up for malabsorption and weight loss.  Subjective:  Patient is 82 year old Caucasian female who is here for scheduled visit accompanied by her caretaker in her niece. She was last seen on 03/12/2016. She was begun on Creon on 03/12/2016 after she completed quantitative fecal fat analysis confirming steatorrhea. Patient reports feeling much better. Her niece agrees. Appetite has improved. She is having less bloating and stool consistency has improved. She denies bleeding or melena and to colostomy. She reports cough and hemoptysis over the last few days. She denies chest pain shortness of breath or fever. She says that time she is sees white pills and ileostomy bag. She is not sure which medication it is. She will check and let us know. She also reports that she has developed a small lump close to her ileostomy. She was checked by Dr. Wolfgang Phoenix who advised observation. Patient has gained 3 pounds since her last visit.   Current Medications: Outpatient Encounter Prescriptions as of 05/21/2016  Medication Sig  . acetaminophen (TYLENOL) 325 MG tablet Take 2 tablets (650 mg total) by mouth every 6 (six) hours as needed for mild pain or headache (or Fever >/= 101).  . ATROVENT HFA 17 MCG/ACT inhaler 2 PUFFS FOUR TIMES DAILY.  . citalopram (CELEXA) 10 MG tablet TAKE (1) TABLET BY MOUTH ONCE DAILY.  . diazepam (VALIUM) 5 MG tablet Take 0.5 tablets twice a daily as needed. Take sparingly (Prescription must last 30 days)  . Doxylamine-DM (ROBITUSSIN NIGHTTIME COUGH DM) 12.5-30 MG/10ML LIQD Take by mouth. Patient taked 1 teaspoon as needed.  . fluticasone (FLONASE) 50 MCG/ACT nasal spray Place 2 sprays into both nostrils daily.  Marland Kitchen levothyroxine (SYNTHROID, LEVOTHROID) 25 MCG tablet TAKE 2 TABLETS ON MONDAY AND FRIDAY. & 1 TABLET ALL OTHER DAYS.  Marland Kitchen lipase/protease/amylase (CREON) 12000 units CPEP capsule Take 2 capsules (24,000 Units total) by mouth 3 (three) times daily with  meals.  Marland Kitchen loperamide (IMODIUM) 2 MG capsule Take 1 capsule (2 mg total) by mouth daily before breakfast.  . Melatonin 5 MG CAPS Take 5 mg by mouth at bedtime.  . memantine (NAMENDA) 10 MG tablet TAKE (1) TABLET TWICE DAILY.  . metoprolol tartrate (LOPRESSOR) 25 MG tablet TAKE 1/2 TABLET TWICE DAILY FOR HIGH BLOOD PRESSURE.  Marland Kitchen OVER THE COUNTER MEDICATION Extra Strength Gas Relief -  Patient states that she takes after each meal.  . OVER THE COUNTER MEDICATION H[place]ands Nerve Tonic - patient states that she takes 1 by mouth when she is out of Valium.  . pantoprazole (PROTONIX) 40 MG tablet TAKE ONE TABLET DAILY FOR ACID REFLUX.  Marland Kitchen potassium chloride (K-DUR) 10 MEQ tablet TAKE (1) TABLET BY MOUTH ONCE DAILY.  Marland Kitchen promethazine (PHENERGAN) 12.5 MG tablet Take 1 tablet (12.5 mg total) by mouth every 6 (six) hours as needed for nausea or vomiting.  . ranitidine (ACID REDUCER) 150 MG tablet Take 150 mg by mouth as needed for heartburn.  . Simethicone (PHAZYME PO) Take by mouth 3 (three) times daily.  Marland Kitchen topiramate (TOPAMAX) 25 MG tablet TAKE (1) TABLET BY MOUTH ONCE DAILY.  Marland Kitchen venlafaxine XR (EFFEXOR-XR) 75 MG 24 hr capsule TAKE 1 CAPSULE ONCE DAILY WITH BREAKFAST.  . [DISCONTINUED] cefPROZIL (CEFZIL) 250 MG tablet Take 1 tablet (250 mg total) by mouth 2 (two) times daily. (Patient not taking: Reported on 05/21/2016)  . [DISCONTINUED] OXYGEN 2L / via N/C to maintain sats 90% or greater   Facility-Administered Encounter Medications as of 05/21/2016  Medication  . lipase/protease/amylase (CREON)  capsule 24,000 Units     Objective: Blood pressure 100/66, pulse 62, temperature 97.7 F (36.5 C), temperature source Oral, resp. rate 16, height '4\' 11"'$  (1.499 m), weight 75 lb 4.8 oz (34.2 kg). Patient is alert and in no acute distress. Conjunctiva is pink. Sclera is nonicteric Oropharyngeal mucosa is normal. No neck masses or thyromegaly noted. Cardiac exam with regular rhythm normal S1 and S2. No murmur or  gallop noted. Auscultation of lungs revealed dry Crackles on the left side. Breath sounds are was clear on the right side. Abdomen is flat. Ileostomy bag has brownish pasty stool. Abdomen is soft and nontender. Ileostomy bag was removed. Stoma appears healthy. There is a 10 x 15 mm submucosal nodule to the right of ileostomy consistent with small lipoma.  No LE edema or clubbing noted.   Assessment:  #1. Malabsorption. She is doing well with pancreatic enzyme supplement. She has gained 4% body weight which is reassuring. #2. Weight loss secondary to #1. She is starting to gain weight. Hopefully this trend will continue. #3. Small peristomal subcutaneous lipoma. Will monitor. If it is difficult to place ileostomy bag it may have to be removed. #4. Hemoptysis. She has remote history of TB and cavitary lung disease. She has been evaluated by pulmonologist in the past. She does not appear to be acutely ill.   Plan:  Continue pancreatic enzyme supplement at current dose. Follow-up with Dr. Wolfgang Phoenix regarding hemoptysis. OV in 4 months.

## 2016-05-21 NOTE — Patient Instructions (Signed)
Take Imodium 2 mg by mouth every morning. Follow-up with Dr.Scott Luking regarding hemoptysis.

## 2016-05-23 ENCOUNTER — Other Ambulatory Visit: Payer: Self-pay

## 2016-05-23 ENCOUNTER — Telehealth: Payer: Self-pay | Admitting: Family Medicine

## 2016-05-23 DIAGNOSIS — R059 Cough, unspecified: Secondary | ICD-10-CM

## 2016-05-23 DIAGNOSIS — R042 Hemoptysis: Secondary | ICD-10-CM

## 2016-05-23 DIAGNOSIS — R05 Cough: Secondary | ICD-10-CM

## 2016-05-23 NOTE — Telephone Encounter (Signed)
The patient's nephew raised concerns that the patient was having chest congestion and occasionally coughing up blood. She just recently saw Dr. Melony Overly who recommended that she follow-up here. She has a follow-up visit this Tuesday. I would like for her to get a chest x-ray done either on Friday or Monday because of cough and hemoptysis. No need to do the chest x-ray today with the weather being subpar

## 2016-05-23 NOTE — Telephone Encounter (Signed)
Notified patient she needs to get a chest x-ray done either on Friday or Monday because of cough and hemoptysis. No need to do the chest x-ray today with the weather being subpar. Patient verbalized understanding. Order in system.

## 2016-05-27 ENCOUNTER — Ambulatory Visit (HOSPITAL_COMMUNITY)
Admission: RE | Admit: 2016-05-27 | Discharge: 2016-05-27 | Disposition: A | Discharge status: Home / Self Care | Payer: Medicare Other | Source: Ambulatory Visit | Attending: Family Medicine | Admitting: Family Medicine

## 2016-05-27 DIAGNOSIS — I700 Atherosclerosis of aorta: Secondary | ICD-10-CM | POA: Diagnosis not present

## 2016-05-27 DIAGNOSIS — R918 Other nonspecific abnormal finding of lung field: Secondary | ICD-10-CM | POA: Insufficient documentation

## 2016-05-27 DIAGNOSIS — R05 Cough: Secondary | ICD-10-CM | POA: Diagnosis present

## 2016-05-27 DIAGNOSIS — R042 Hemoptysis: Secondary | ICD-10-CM | POA: Diagnosis not present

## 2016-05-28 ENCOUNTER — Ambulatory Visit (INDEPENDENT_AMBULATORY_CARE_PROVIDER_SITE_OTHER): Payer: Medicare Other | Admitting: Family Medicine

## 2016-05-28 ENCOUNTER — Encounter: Payer: Self-pay | Admitting: Family Medicine

## 2016-05-28 VITALS — BP 98/70 | Temp 98.0°F | Ht 59.0 in | Wt 78.2 lb

## 2016-05-28 DIAGNOSIS — J181 Lobar pneumonia, unspecified organism: Secondary | ICD-10-CM

## 2016-05-28 DIAGNOSIS — J189 Pneumonia, unspecified organism: Secondary | ICD-10-CM

## 2016-05-28 DIAGNOSIS — L821 Other seborrheic keratosis: Secondary | ICD-10-CM

## 2016-05-28 MED ORDER — CEFDINIR 300 MG PO CAPS: 300.0000 mg | ORAL_CAPSULE | Freq: Two times a day (BID) | ORAL | 0 refills | Status: DC

## 2016-05-28 NOTE — Progress Notes (Addendum)
   Subjective:    Patient ID: [name], female    DOB: 10-16-1928, 82 y.o.   MRN: [id number]  Cough  This is a new problem. The current episode started more than 1 month ago. The problem has been unchanged. The cough is productive of bloody sputum. Associated symptoms include headaches. Nothing aggravates the symptoms. Treatments tried: tylenol. The treatment provided no relief.  Denies high fever chills sweats denies weight loss. Coughed up phlegm with blood in it. Patient has had extensive amount of CAT scans in the past and has seen specialist for workup for possible TB was declared negative for TB by specialist  X-ray was reviewed and shown to the patient  Review of Systems  Respiratory: Positive for cough.   Neurological: Positive for headaches.       Objective:   Physical Exam  On examination the neck no masses heart regular lungs have some rails in the left side which is consistent with what she is had a long-standing of time no respiratory distress Patient has a seborrheic keratosis on the back of the right leg which is become somewhat irritated.     Assessment & Plan:  Pneumonia antibiotics prescribed follow-up in January if hemoptysis persists notify us if it persists patient may end up needing another CT scan  Referral to dermatology possible removal of seborrheic keratosis necessary because of continuous irritation  It should be noted that this patient was seen as part of a face-to-face evaluation. It is necessary for this patient have home health care. She also has significant weakness. She cannot push her self around then the standard wheelchair due to cachexia frailty as well as upper body weakness. She need to light weight wheelchair. This is necessary for ADLs and for her to get around within her living confines to help care for herself.

## 2016-06-03 ENCOUNTER — Encounter: Payer: Self-pay | Admitting: Family Medicine

## 2016-06-07 ENCOUNTER — Emergency Department (HOSPITAL_COMMUNITY): Payer: Medicare Other

## 2016-06-07 ENCOUNTER — Emergency Department (HOSPITAL_COMMUNITY)
Admission: EM | Admit: 2016-06-07 | Discharge: 2016-06-07 | Disposition: A | Discharge status: Home / Self Care | Payer: Medicare Other | Attending: Emergency Medicine | Admitting: Emergency Medicine

## 2016-06-07 ENCOUNTER — Encounter (HOSPITAL_COMMUNITY): Payer: Self-pay | Admitting: Emergency Medicine

## 2016-06-07 DIAGNOSIS — J449 Chronic obstructive pulmonary disease, unspecified: Secondary | ICD-10-CM | POA: Insufficient documentation

## 2016-06-07 DIAGNOSIS — Z79899 Other long term (current) drug therapy: Secondary | ICD-10-CM | POA: Diagnosis not present

## 2016-06-07 DIAGNOSIS — R05 Cough: Secondary | ICD-10-CM | POA: Diagnosis not present

## 2016-06-07 DIAGNOSIS — L03113 Cellulitis of right upper limb: Secondary | ICD-10-CM | POA: Diagnosis not present

## 2016-06-07 DIAGNOSIS — L03119 Cellulitis of unspecified part of limb: Secondary | ICD-10-CM

## 2016-06-07 DIAGNOSIS — E039 Hypothyroidism, unspecified: Secondary | ICD-10-CM | POA: Diagnosis not present

## 2016-06-07 DIAGNOSIS — M7989 Other specified soft tissue disorders: Secondary | ICD-10-CM | POA: Diagnosis not present

## 2016-06-07 DIAGNOSIS — M25531 Pain in right wrist: Secondary | ICD-10-CM | POA: Diagnosis present

## 2016-06-07 LAB — CBC WITH DIFFERENTIAL/PLATELET
Basophils Absolute: 0.1 10*3/uL (ref 0.0–0.1)
Basophils Relative: 1 %
EOS PCT: 3 %
Eosinophils Absolute: 0.3 10*3/uL (ref 0.0–0.7)
HCT: 40.2 % (ref 36.0–46.0)
Hemoglobin: 12.9 g/dL (ref 12.0–15.0)
LYMPHS ABS: 2.2 10*3/uL (ref 0.7–4.0)
LYMPHS PCT: 23 %
MCH: 30.5 pg (ref 26.0–34.0)
MCHC: 32.1 g/dL (ref 30.0–36.0)
MCV: 95 fL (ref 78.0–100.0)
MONOS PCT: 11 %
Monocytes Absolute: 1.1 10*3/uL — ABNORMAL HIGH (ref 0.1–1.0)
Neutro Abs: 6.2 10*3/uL (ref 1.7–7.7)
Neutrophils Relative %: 62 %
PLATELETS: 268 10*3/uL (ref 150–400)
RBC: 4.23 MIL/uL (ref 3.87–5.11)
RDW: 15 % (ref 11.5–15.5)
WBC: 9.8 10*3/uL (ref 4.0–10.5)

## 2016-06-07 LAB — BASIC METABOLIC PANEL
Anion gap: 8 (ref 5–15)
BUN: 17 mg/dL (ref 6–20)
CHLORIDE: 98 mmol/L — AB (ref 101–111)
CO2: 30 mmol/L (ref 22–32)
Calcium: 9.2 mg/dL (ref 8.9–10.3)
Creatinine, Ser: 0.96 mg/dL (ref 0.44–1.00)
GFR calc Af Amer: >60 mL/min (ref >60)
GFR calc non Af Amer: 52 mL/min — ABNORMAL LOW (ref >60)
GLUCOSE: 192 mg/dL — AB (ref 65–99)
POTASSIUM: 3.8 mmol/L (ref 3.5–5.1)
Sodium: 136 mmol/L (ref 135–145)

## 2016-06-07 LAB — I-STAT CG4 LACTIC ACID, ED: Lactic Acid, Venous: 1.44 mmol/L (ref 0.5–1.9)

## 2016-06-07 MED ORDER — SULFAMETHOXAZOLE-TRIMETHOPRIM 800-160 MG PO TABS: 1.0000 | ORAL_TABLET | Freq: Two times a day (BID) | ORAL | 0 refills | Status: AC

## 2016-06-07 MED ORDER — CEPHALEXIN 500 MG PO CAPS
500.0000 mg | ORAL_CAPSULE | Freq: Once | ORAL | Status: AC
  Administered 2016-06-07: 500 mg via ORAL
  Filled 2016-06-07: qty 1

## 2016-06-07 MED ORDER — SULFAMETHOXAZOLE-TRIMETHOPRIM 800-160 MG PO TABS
1.0000 | ORAL_TABLET | Freq: Once | ORAL | Status: AC
  Administered 2016-06-07: 1 via ORAL
  Filled 2016-06-07: qty 1

## 2016-06-07 MED ORDER — CEPHALEXIN 500 MG PO CAPS: 500.0000 mg | ORAL_CAPSULE | Freq: Two times a day (BID) | ORAL | 0 refills | Status: DC

## 2016-06-07 NOTE — ED Provider Notes (Signed)
[facility] DEPT Provider Note   CSN: [id number] Arrival date & time: 06/07/16  1815     History   Chief Complaint Chief Complaint  Patient presents with  . Wrist Pain    HPI [name] is a 82 y.o. female.  HPI  82 year old female with a history of COPD presents with right wrist pain, swelling, and redness. Patient noticed the pain and redness yesterday morning. However today the redness is even more extensive. No fevers although she has had local warmth. She took Tylenol for pain prior to arrival and this has helped. Patient was found to have O2 sats of 89% on room air. She states she does not know what her normal oxygen is and does not wear oxygen at home. She has a chronic cough that she feels is not worse than typical. She recently was on Cefdinir for "an abdominal infection".  Past Medical History:  Diagnosis Date  . Anemia   . 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 Chronic abdominal pain   . Chronic neck and back pain   . COPD (chronic obstructive pulmonary disease) ([place])   . GAD (generalized anxiety disorder)   . Gastroesophageal reflux disease   . Gastroparesis   . IBS (irritable bowel syndrome)   . Lung mass   . Migraines   . Osteoporosis   . 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 Renal insufficiency   . S/P colostomy ([place])   . Tachycardia     Patient Active Problem List   Diagnosis Date Noted  . Cachexia ([place]) 04/23/2016  . Frailty 01/23/2016  . Abdominal pain 01/01/2016  . Acute respiratory failure with hypoxia ([place]) 12/20/2015  . Dyspnea 07/25/2015  . Adult failure to thrive 05/30/2015  . 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 Hemoptysis 01/15/2015  . Pernicious anemia 09/13/2014  . Prediabetes 12/17/2013  . Hypothyroidism 10/04/2013  . Muscle cramps 05/03/2013  . Lung mass 03/17/2013  . Chronic headaches 11/11/2012  . Weight loss 09/21/2012  . Diarrhea 05/11/2012  . Gastroparesis 11/26/2011  . Flatulence 11/26/2011  . Osteoporosis 11/26/2011  .  Ileostomy in place [facility]) 11/26/2011  . SCIATICA 08/24/2007    Past Surgical History:  Procedure Laterality Date  . ABDOMINAL HYSTERECTOMY    . APPENDECTOMY    . CHOLECYSTECTOMY    . ESOPHAGOGASTRODUODENOSCOPY    . ESOPHAGOGASTRODUODENOSCOPY N/A 08/25/2015   Procedure: ESOPHAGOGASTRODUODENOSCOPY (EGD);  Surgeon: Rogene Houston, MD;  Location: [facility] ENDO SUITE;  Service: Endoscopy;  Laterality: N/A;  730  . ILEOSCOPY N/A 08/25/2015   Procedure: ILEOSCOPY THROUGH STOMA;  Surgeon: Rogene Houston, MD;  Location: [facility] ENDO SUITE;  Service: Endoscopy;  Laterality: N/A;  . PARTIAL GASTRECTOMY  1967   3/4 gastric resection for bleeding ulcers  . TONSILLECTOMY      OB History    Gravida Para Term Preterm AB Living             3   SAB TAB Ectopic Multiple Live Births                   Home Medications    Prior to Admission medications   Medication Sig Start Date End Date Taking? Authorizing Provider  acetaminophen (TYLENOL) 325 MG tablet Take 2 tablets (650 mg total) by mouth every 6 (six) hours as needed for mild pain or headache (or Fever >/= 101). 12/22/15  Yes Samuella Cota, MD  ATROVENT HFA 17 MCG/ACT inhaler 2 PUFFS FOUR TIMES DAILY. 09/12/15  Yes Kathyrn Drown, MD  citalopram (CELEXA) 10 MG tablet TAKE (1) TABLET BY MOUTH ONCE DAILY. Patient taking differently: TAKE (1) TABLET BY MOUTH ONCE DAILY IN THE MORNING 01/18/16  Yes Kathyrn Drown, MD  diazepam (VALIUM) 5 MG tablet Take 0.5 tablets twice a daily as needed. Take sparingly (Prescription must last 30 days) Patient taking differently: Take 5 mg by mouth at bedtime. Take 0.5 tablets twice a daily as needed. Take sparingly (Prescription must last 30 days) 05/01/16  Yes Kathyrn Drown, MD  Doxylamine-DM (ROBITUSSIN NIGHTTIME COUGH DM) 12.5-30 MG/10ML LIQD Take by mouth. Patient taked 1 teaspoon as needed.   Yes Historical Provider, MD  levothyroxine (SYNTHROID, LEVOTHROID) 25 MCG tablet TAKE 2 TABLETS ON MONDAY AND FRIDAY. & 1 TABLET  ALL OTHER DAYS. 05/09/16  Yes Kathyrn Drown, MD  lipase/protease/amylase (CREON) 12000 units CPEP capsule Take 2 capsules (24,000 Units total) by mouth 3 (three) times daily with meals. 03/12/16  Yes Rogene Houston, MD  loperamide (IMODIUM) 2 MG capsule Take 1 capsule (2 mg total) by mouth daily before breakfast. 04/03/16  Yes Rogene Houston, MD  Melatonin 5 MG CAPS Take 5 mg by mouth at bedtime.   Yes Historical Provider, MD  memantine (NAMENDA) 10 MG tablet TAKE (1) TABLET TWICE DAILY. 02/14/16  Yes Kathyrn Drown, MD  metoprolol tartrate (LOPRESSOR) 25 MG tablet TAKE 1/2 TABLET TWICE DAILY FOR HIGH BLOOD PRESSURE. 01/18/16  Yes Kathyrn Drown, MD  potassium chloride (K-DUR) 10 MEQ tablet TAKE (1) TABLET BY MOUTH ONCE DAILY. 10/26/15  Yes Kathyrn Drown, MD  promethazine (PHENERGAN) 12.5 MG tablet Take 1 tablet (12.5 mg total) by mouth every 6 (six) hours as needed for nausea or vomiting. 04/03/16  Yes Rogene Houston, MD  Simethicone (GAS RELIEF PO) Take 1 tablet by mouth 3 (three) times daily after meals.   Yes Historical Provider, MD  venlafaxine XR (EFFEXOR-XR) 75 MG 24 hr capsule TAKE 1 CAPSULE ONCE DAILY WITH BREAKFAST. 05/09/16  Yes Kathyrn Drown, MD  cefdinir (OMNICEF) 300 MG capsule Take 1 capsule (300 mg total) by mouth 2 (two) times daily. Patient not taking: Reported on 06/07/2016 05/28/16   Kathyrn Drown, MD  cephALEXin (KEFLEX) 500 MG capsule Take 1 capsule (500 mg total) by mouth 2 (two) times daily. 06/07/16   Sherwood Gambler, MD  sulfamethoxazole-trimethoprim (BACTRIM DS,SEPTRA DS) 800-160 MG tablet Take 1 tablet by mouth 2 (two) times daily. 06/07/16 06/14/16  Sherwood Gambler, MD    Family History Family History  Problem Relation Age of Onset  . Stroke Mother   . Stroke Father   . Diabetes Sister   . Diabetes Brother   . Stroke Brother   . Cancer Sister     unknown type  . Diabetes Sister   . Emphysema Sister   . Diabetes Brother   . Stroke Brother   . Diabetes Son   .  Irritable bowel syndrome Son   . Diabetes Son   . Hypertension Son     Social History Social History  Substance Use Topics  . Smoking status: Never Smoker  . Smokeless tobacco: Never Used  . Alcohol use No     Allergies   Levaquin [levofloxacin]; Tetanus toxoid; Zolpidem tartrate; and Fosamax [alendronate sodium]   Review of Systems Review of Systems  Constitutional: Negative for fever.  Respiratory: Positive for cough. Negative for shortness of breath.   Musculoskeletal: Positive for arthralgias and joint swelling.  Skin: Positive for color change.  All other  systems reviewed and are negative.    Physical Exam Updated Vital Signs BP (!) 132/101 (BP Location: Right Arm)   Pulse 72   Temp 98.2 F (36.8 C) (Oral)   Resp 17   Ht '4\' 11"'$  (1.499 m)   Wt 73 lb 6.4 oz (33.3 kg)   SpO2 (!) 89%   BMI 14.83 kg/m   Physical Exam  Constitutional: She is oriented to person, place, and time. She appears well-developed and well-nourished. No distress.  HENT:  Head: Normocephalic and atraumatic.  Right Ear: External ear normal.  Left Ear: External ear normal.  Nose: Nose normal.  Eyes: Right eye exhibits no discharge. Left eye exhibits no discharge.  Cardiovascular: Normal rate, regular rhythm and normal heart sounds.   Pulses:      Radial pulses are 2+ on the right side.  Pulmonary/Chest: Effort normal. She has rales (bibasilar).  Abdominal: Soft. There is no tenderness.  Musculoskeletal:       Right wrist: She exhibits tenderness and swelling. She exhibits normal range of motion (no decreased ROM but is painful).       Arms: Neurological: She is alert and oriented to person, place, and time.  Skin: Skin is warm and dry. She is not diaphoretic.  Nursing note and vitals reviewed.    ED Treatments / Results  Labs (all labs ordered are listed, but only abnormal results are displayed) Labs Reviewed  BASIC METABOLIC PANEL - Abnormal; Notable for the following:        Result Value   Chloride 98 (*)    Glucose, Bld 192 (*)    GFR calc non Af Amer 52 (*)    All other components within normal limits  CBC WITH DIFFERENTIAL/PLATELET - Abnormal; Notable for the following:    Monocytes Absolute 1.1 (*)    All other components within normal limits  I-STAT CG4 LACTIC ACID, ED    EKG  EKG Interpretation None       Radiology Dg Chest 2 View  Result Date: 06/07/2016 CLINICAL DATA:  82 year old female with recent onset cough. Hypoxia. Initial encounter. Chronic lung disease. EXAM: CHEST  2 VIEW COMPARISON:  05/27/2016 chest radiographs and earlier. FINDINGS: AP upright and lateral views of the chest. Resolution of the acute right perihilar opacity seen on. Chronic hyperinflation. Chronic bilateral upper lobe architectural distortion. Chronic surgical clips at the distal esophagus and gastroesophageal junction. Chronic enlargement of the distal esophagus on prior CTs. This might explain the rounded cavitary type appearance projecting over the distal esophagus on the lateral view today. No definite new pulmonary opacity. Calcified aortic atherosclerosis. Osteopenia. No acute osseous abnormality identified. IMPRESSION: Severe chronic lung disease with resolved patchy right perihilar opacity since 05/27/2016 and no definite new cardiopulmonary abnormality. Calcified aortic atherosclerosis. Electronically Signed   By: Genevie Ann M.D.   On: 06/07/2016 19:49   Dg Wrist Complete Right  Result Date: 06/07/2016 CLINICAL DATA:  Swelling and erythema.  No known injury. EXAM: RIGHT WRIST - COMPLETE 3+ VIEW COMPARISON:  None. FINDINGS: There is no evidence of fracture or dislocation. There is no evidence of arthropathy or other focal bone abnormality. There is moderate soft tissue swelling. No erosions. There is focal calcification within the space lateral to the radiocarpal joint. IMPRESSION: Moderate soft tissue swelling without evidence of acute osseous injury. Electronically  Signed   By: Ulyses Jarred M.D.   On: 06/07/2016 19:04    Procedures Procedures (including critical care time) ULTRASOUND LIMITED SOFT TISSUE/ MUSCULOSKELETAL: Right wrist  soft tisue Indication: right wrist cellulitis, r/o abscess Linear probe used to evaluate area of interest in two planes. Findings:  No fluid collection/abscess Performed by: Dr Regenia Skeeter Images saved electronically  Medications Ordered in ED Medications  cephALEXin (KEFLEX) capsule 500 mg (500 mg Oral Given 06/07/16 2101)  sulfamethoxazole-trimethoprim (BACTRIM DS,SEPTRA DS) 800-160 MG per tablet 1 tablet (1 tablet Oral Given 06/07/16 2101)     Initial Impression / Assessment and Plan / ED Course  I have reviewed the triage vital signs and the nursing notes.  Pertinent labs & imaging results that were available during my care of the patient were reviewed by me and considered in my medical decision making (see chart for details).  Clinical Course as of Jun 07 2352  Ludwig Clarks Jun 07, 2016  1918 Bedside u/s negative for fluid collection/abscess. Appears to be cellulitis. Will get labs. She denies being dyspneic but her O2 is consistently 88-89% room air. Might be her normal based on chronic COPD. Given chronic cough, will get CXR in addition to labs.  [SG]    Clinical Course User Index [SG] Sherwood Gambler, MD    Sats have remained stable in high 80s, low 90s. All seem to be above 88%. I think with her severe chronic COPD this is where she lives, especially since she's not having any dyspnea. As for her wrist, will start antibiotics as she does not appear acutely ill and has no fever or systemic symptoms. No abscess. Highly doubt septic joint. If not better or getting worse, instructed to come back for re-check.  Final Clinical Impressions(s) / ED Diagnoses   Final diagnoses:  Cellulitis of wrist    New Prescriptions Discharge Medication List as of 06/07/2016  9:18 PM    START taking these medications   Details    cephALEXin (KEFLEX) 500 MG capsule Take 1 capsule (500 mg total) by mouth 2 (two) times daily., Starting Fri 06/07/2016, Print    sulfamethoxazole-trimethoprim (BACTRIM DS,SEPTRA DS) 800-160 MG tablet Take 1 tablet by mouth 2 (two) times daily., Starting Fri 06/07/2016, Until Fri 06/14/2016, Print         Sherwood Gambler, MD 06/07/16 [phone]

## 2016-06-07 NOTE — ED Triage Notes (Signed)
Pt acuity changed. O2 sat on RA 89%. Pt's R hand fingers cyanotic, cap refill normal.

## 2016-06-07 NOTE — ED Triage Notes (Signed)
Pt c/o R wrist pain, redness and edema.

## 2016-06-07 NOTE — ED Notes (Signed)
Increased redness and swelling to pts right wrist. Verner Chol informed

## 2016-06-09 ENCOUNTER — Encounter (HOSPITAL_COMMUNITY): Payer: Self-pay | Admitting: *Deleted

## 2016-06-09 ENCOUNTER — Emergency Department (HOSPITAL_COMMUNITY): Payer: Medicare Other

## 2016-06-09 ENCOUNTER — Observation Stay (HOSPITAL_COMMUNITY)
Admission: EM | Admit: 2016-06-09 | Discharge: 2016-06-10 | Disposition: A | Discharge status: Home / Self Care | Payer: Medicare Other | Attending: Family Medicine | Admitting: Family Medicine

## 2016-06-09 DIAGNOSIS — M79605 Pain in left leg: Secondary | ICD-10-CM | POA: Diagnosis not present

## 2016-06-09 DIAGNOSIS — R51 Headache: Secondary | ICD-10-CM | POA: Insufficient documentation

## 2016-06-09 DIAGNOSIS — Y999 Unspecified external cause status: Secondary | ICD-10-CM | POA: Insufficient documentation

## 2016-06-09 DIAGNOSIS — R627 Adult failure to thrive: Secondary | ICD-10-CM | POA: Diagnosis present

## 2016-06-09 DIAGNOSIS — J449 Chronic obstructive pulmonary disease, unspecified: Secondary | ICD-10-CM | POA: Insufficient documentation

## 2016-06-09 DIAGNOSIS — S32511A Fracture of superior rim of right pubis, initial encounter for closed fracture: Principal | ICD-10-CM | POA: Insufficient documentation

## 2016-06-09 DIAGNOSIS — I10 Essential (primary) hypertension: Secondary | ICD-10-CM | POA: Diagnosis not present

## 2016-06-09 DIAGNOSIS — Y92009 Unspecified place in unspecified non-institutional (private) residence as the place of occurrence of the external cause: Secondary | ICD-10-CM | POA: Diagnosis not present

## 2016-06-09 DIAGNOSIS — W01198A Fall on same level from slipping, tripping and stumbling with subsequent striking against other object, initial encounter: Secondary | ICD-10-CM | POA: Insufficient documentation

## 2016-06-09 DIAGNOSIS — R2689 Other abnormalities of gait and mobility: Secondary | ICD-10-CM

## 2016-06-09 DIAGNOSIS — F418 Other specified anxiety disorders: Secondary | ICD-10-CM | POA: Diagnosis present

## 2016-06-09 DIAGNOSIS — Z79899 Other long term (current) drug therapy: Secondary | ICD-10-CM | POA: Insufficient documentation

## 2016-06-09 DIAGNOSIS — D72829 Elevated white blood cell count, unspecified: Secondary | ICD-10-CM | POA: Diagnosis not present

## 2016-06-09 DIAGNOSIS — S32592A Other specified fracture of left pubis, initial encounter for closed fracture: Secondary | ICD-10-CM | POA: Diagnosis not present

## 2016-06-09 DIAGNOSIS — S32512A Fracture of superior rim of left pubis, initial encounter for closed fracture: Secondary | ICD-10-CM | POA: Insufficient documentation

## 2016-06-09 DIAGNOSIS — Y9389 Activity, other specified: Secondary | ICD-10-CM | POA: Diagnosis not present

## 2016-06-09 DIAGNOSIS — E039 Hypothyroidism, unspecified: Secondary | ICD-10-CM | POA: Insufficient documentation

## 2016-06-09 DIAGNOSIS — R0902 Hypoxemia: Secondary | ICD-10-CM | POA: Insufficient documentation

## 2016-06-09 DIAGNOSIS — S79911A Unspecified injury of right hip, initial encounter: Secondary | ICD-10-CM | POA: Diagnosis present

## 2016-06-09 DIAGNOSIS — E871 Hypo-osmolality and hyponatremia: Secondary | ICD-10-CM | POA: Diagnosis present

## 2016-06-09 DIAGNOSIS — S0990XA Unspecified injury of head, initial encounter: Secondary | ICD-10-CM | POA: Diagnosis not present

## 2016-06-09 DIAGNOSIS — M6281 Muscle weakness (generalized): Secondary | ICD-10-CM

## 2016-06-09 DIAGNOSIS — S8992XA Unspecified injury of left lower leg, initial encounter: Secondary | ICD-10-CM | POA: Diagnosis not present

## 2016-06-09 DIAGNOSIS — S32591A Other specified fracture of right pubis, initial encounter for closed fracture: Secondary | ICD-10-CM

## 2016-06-09 DIAGNOSIS — M25562 Pain in left knee: Secondary | ICD-10-CM | POA: Diagnosis not present

## 2016-06-09 DIAGNOSIS — E119 Type 2 diabetes mellitus without complications: Secondary | ICD-10-CM | POA: Insufficient documentation

## 2016-06-09 DIAGNOSIS — W19XXXA Unspecified fall, initial encounter: Secondary | ICD-10-CM

## 2016-06-09 DIAGNOSIS — S329XXA Fracture of unspecified parts of lumbosacral spine and pelvis, initial encounter for closed fracture: Secondary | ICD-10-CM | POA: Diagnosis present

## 2016-06-09 DIAGNOSIS — R52 Pain, unspecified: Secondary | ICD-10-CM | POA: Diagnosis not present

## 2016-06-09 LAB — CBC WITH DIFFERENTIAL/PLATELET
BASOS ABS: 0 10*3/uL (ref 0.0–0.1)
BASOS PCT: 0 %
EOS ABS: 0.3 10*3/uL (ref 0.0–0.7)
Eosinophils Relative: 1 %
HEMATOCRIT: 38.5 % (ref 36.0–46.0)
HEMOGLOBIN: 12.3 g/dL (ref 12.0–15.0)
Lymphocytes Relative: 15 %
Lymphs Abs: 3.4 10*3/uL (ref 0.7–4.0)
MCH: 30.4 pg (ref 26.0–34.0)
MCHC: 31.9 g/dL (ref 30.0–36.0)
MCV: 95.3 fL (ref 78.0–100.0)
Monocytes Absolute: 1.4 10*3/uL — ABNORMAL HIGH (ref 0.1–1.0)
Monocytes Relative: 6 %
NEUTROS ABS: 17.1 10*3/uL — AB (ref 1.7–7.7)
NEUTROS PCT: 78 %
Platelets: 263 10*3/uL (ref 150–400)
RBC: 4.04 MIL/uL (ref 3.87–5.11)
RDW: 15.2 % (ref 11.5–15.5)
WBC: 22.3 10*3/uL — ABNORMAL HIGH (ref 4.0–10.5)

## 2016-06-09 LAB — BASIC METABOLIC PANEL
ANION GAP: 8 (ref 5–15)
BUN: 17 mg/dL (ref 6–20)
CALCIUM: 9 mg/dL (ref 8.9–10.3)
CO2: 28 mmol/L (ref 22–32)
Chloride: 97 mmol/L — ABNORMAL LOW (ref 101–111)
Creatinine, Ser: 1.01 mg/dL — ABNORMAL HIGH (ref 0.44–1.00)
GFR, EST AFRICAN AMERICAN: 57 mL/min — AB (ref >60)
GFR, EST NON AFRICAN AMERICAN: 49 mL/min — AB (ref >60)
Glucose, Bld: 172 mg/dL — ABNORMAL HIGH (ref 65–99)
Potassium: 3.9 mmol/L (ref 3.5–5.1)
SODIUM: 133 mmol/L — AB (ref 135–145)

## 2016-06-09 MED ORDER — FENTANYL CITRATE (PF) 100 MCG/2ML IJ SOLN
50.0000 ug | Freq: Once | INTRAMUSCULAR | Status: AC
  Administered 2016-06-09: 50 ug via INTRAVENOUS
  Filled 2016-06-09: qty 2

## 2016-06-09 NOTE — ED Provider Notes (Signed)
[facility] DEPT Provider Note   CSN: [id number] Arrival date & time: 06/09/16  2002  By signing my name below, I, Neta Mends, attest that this documentation has been prepared under the direction and in the presence of Sherwood Gambler, MD . Electronically Signed: Neta Mends, ED Scribe. 06/09/2016. 8:21 PM.    History   Chief Complaint Chief Complaint  Patient presents with  . Fall    The history is provided by the patient. No language interpreter was used.   HPI Comments:  [name] is a 82 y.o. female who presents to the Emergency Department via EMS s/p fall that occurred PTA. Pt reports that she was grabbing for the freezer, and then slipped and fell backward. Pt complains of severe, constant left hip and left leg pain. Pt notes that the pain is exacerbated when moving her left leg. Pt braced her fall with her left hand, but denies any current pain to her left hand. Pt reports that she hit her head, and notes associated mild head pain. No alleviating factors noted. Pt denies LOC and neck pain.   Past Medical History:  Diagnosis Date  . Anemia   . 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 Chronic abdominal pain   . Chronic neck and back pain   . COPD (chronic obstructive pulmonary disease) ([place])   . GAD (generalized anxiety disorder)   . Gastroesophageal reflux disease   . Gastroparesis   . IBS (irritable bowel syndrome)   . Lung mass   . Migraines   . Osteoporosis   . 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 Renal insufficiency   . S/P colostomy ([place])   . Tachycardia     Patient Active Problem List   Diagnosis Date Noted  . Depression with anxiety 06/10/2016  . Pubic ramus fracture, left, closed, initial encounter ([place]) 06/10/2016  . Pubic ramus fracture, right, closed, initial encounter ([place]) 06/10/2016  . COPD (chronic obstructive pulmonary disease) ([place]) 06/10/2016  . Hypertension 06/10/2016  . Diabetes mellitus ([place]) 06/10/2016  . Hyponatremia 06/10/2016  .  Leukocytosis 06/10/2016  . Fall at home 06/10/2016  . Pelvic fracture ([place]) 06/10/2016  . Cachexia ([place]) 04/23/2016  . Frailty 01/23/2016  . Abdominal pain 01/01/2016  . Acute respiratory failure with hypoxia ([place]) 12/20/2015  . Dyspnea 07/25/2015  . Adult failure to thrive 05/30/2015  . 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 Hemoptysis 01/15/2015  . Pernicious anemia 09/13/2014  . Prediabetes 12/17/2013  . Hypothyroidism 10/04/2013  . Muscle cramps 05/03/2013  . Lung mass 03/17/2013  . Chronic headaches 11/11/2012  . Weight loss 09/21/2012  . Diarrhea 05/11/2012  . Gastroparesis 11/26/2011  . Flatulence 11/26/2011  . Osteoporosis 11/26/2011  . Ileostomy in place [facility]) 11/26/2011  . SCIATICA 08/24/2007    Past Surgical History:  Procedure Laterality Date  . ABDOMINAL HYSTERECTOMY    . APPENDECTOMY    . CHOLECYSTECTOMY    . ESOPHAGOGASTRODUODENOSCOPY    . ESOPHAGOGASTRODUODENOSCOPY N/A 08/25/2015   Procedure: ESOPHAGOGASTRODUODENOSCOPY (EGD);  Surgeon: Rogene Houston, MD;  Location: [facility] ENDO SUITE;  Service: Endoscopy;  Laterality: N/A;  730  . ILEOSCOPY N/A 08/25/2015   Procedure: ILEOSCOPY THROUGH STOMA;  Surgeon: Rogene Houston, MD;  Location: [facility] ENDO SUITE;  Service: Endoscopy;  Laterality: N/A;  . PARTIAL GASTRECTOMY  1967   3/4 gastric resection for bleeding ulcers  . TONSILLECTOMY      OB History    Gravida Para Term Preterm AB Living  3   SAB TAB Ectopic Multiple Live Births                   Home Medications    Prior to Admission medications   Medication Sig Start Date End Date Taking? Authorizing Provider  acetaminophen (TYLENOL) 325 MG tablet Take 2 tablets (650 mg total) by mouth every 6 (six) hours as needed for mild pain or headache (or Fever >/= 101). 12/22/15  Yes Samuella Cota, MD  ATROVENT HFA 17 MCG/ACT inhaler 2 PUFFS FOUR TIMES DAILY. 09/12/15  Yes Kathyrn Drown, MD  cephALEXin (KEFLEX) 500 MG capsule Take 1 capsule (500 mg total)  by mouth 2 (two) times daily. 06/07/16  Yes Sherwood Gambler, MD  citalopram (CELEXA) 10 MG tablet TAKE (1) TABLET BY MOUTH ONCE DAILY. Patient taking differently: TAKE (1) TABLET BY MOUTH ONCE DAILY IN THE MORNING 01/18/16  Yes Kathyrn Drown, MD  diazepam (VALIUM) 5 MG tablet Take 0.5 tablets twice a daily as needed. Take sparingly (Prescription must last 30 days) Patient taking differently: Take 5 mg by mouth at bedtime. Take 0.5 tablets twice a daily as needed. Take sparingly (Prescription must last 30 days) 05/01/16  Yes Kathyrn Drown, MD  Doxylamine-DM (ROBITUSSIN NIGHTTIME COUGH DM) 12.5-30 MG/10ML LIQD Take by mouth. Patient taked 1 teaspoon as needed.   Yes Historical Provider, MD  levothyroxine (SYNTHROID, LEVOTHROID) 25 MCG tablet TAKE 2 TABLETS ON MONDAY AND FRIDAY. & 1 TABLET ALL OTHER DAYS. 05/09/16  Yes Kathyrn Drown, MD  lipase/protease/amylase (CREON) 12000 units CPEP capsule Take 2 capsules (24,000 Units total) by mouth 3 (three) times daily with meals. 03/12/16  Yes Rogene Houston, MD  loperamide (IMODIUM) 2 MG capsule Take 1 capsule (2 mg total) by mouth daily before breakfast. 04/03/16  Yes Rogene Houston, MD  Melatonin 5 MG CAPS Take 5 mg by mouth at bedtime.   Yes Historical Provider, MD  memantine (NAMENDA) 10 MG tablet TAKE (1) TABLET TWICE DAILY. 02/14/16  Yes Kathyrn Drown, MD  metoprolol tartrate (LOPRESSOR) 25 MG tablet TAKE 1/2 TABLET TWICE DAILY FOR HIGH BLOOD PRESSURE. 01/18/16  Yes Kathyrn Drown, MD  potassium chloride (K-DUR) 10 MEQ tablet TAKE (1) TABLET BY MOUTH ONCE DAILY. 10/26/15  Yes Kathyrn Drown, MD  promethazine (PHENERGAN) 12.5 MG tablet Take 1 tablet (12.5 mg total) by mouth every 6 (six) hours as needed for nausea or vomiting. 04/03/16  Yes Rogene Houston, MD  Simethicone (GAS RELIEF PO) Take 1 tablet by mouth 3 (three) times daily after meals.   Yes Historical Provider, MD  venlafaxine XR (EFFEXOR-XR) 75 MG 24 hr capsule TAKE 1 CAPSULE ONCE DAILY WITH  BREAKFAST. 05/09/16  Yes Kathyrn Drown, MD  cefdinir (OMNICEF) 300 MG capsule Take 1 capsule (300 mg total) by mouth 2 (two) times daily. Patient not taking: Reported on 06/07/2016 05/28/16   Kathyrn Drown, MD  sulfamethoxazole-trimethoprim (BACTRIM DS,SEPTRA DS) 800-160 MG tablet Take 1 tablet by mouth 2 (two) times daily. 06/07/16 06/14/16  Sherwood Gambler, MD    Family History Family History  Problem Relation Age of Onset  . Stroke Mother   . Stroke Father   . Diabetes Sister   . Diabetes Brother   . Stroke Brother   . Cancer Sister     unknown type  . Diabetes Sister   . Emphysema Sister   . Diabetes Brother   . Stroke Brother   . Diabetes Son   .  Irritable bowel syndrome Son   . Diabetes Son   . Hypertension Son     Social History Social History  Substance Use Topics  . Smoking status: Never Smoker  . Smokeless tobacco: Never Used  . Alcohol use No     Allergies   Levaquin [levofloxacin]; Tetanus toxoid; Zolpidem tartrate; and Fosamax [alendronate sodium]   Review of Systems Review of Systems  Musculoskeletal: Positive for myalgias. Negative for neck pain.  Neurological: Positive for headaches. Negative for syncope.  All other systems reviewed and are negative.    Physical Exam Updated Vital Signs BP 165/89   Pulse 99   Temp 98.2 F (36.8 C) (Oral)   Resp 18   Ht '4\' 11"'$  (1.499 m)   Wt 73 lb 6 oz (33.3 kg)   SpO2 92%   BMI 14.82 kg/m   Physical Exam  Constitutional: She is oriented to person, place, and time. She appears well-developed and well-nourished.  HENT:  Head: Normocephalic and atraumatic.  Right Ear: External ear normal.  Left Ear: External ear normal.  Nose: Nose normal.  Eyes: Right eye exhibits no discharge. Left eye exhibits no discharge.  Cardiovascular: Normal rate, regular rhythm and normal heart sounds.   Pulses:      Radial pulses are 2+ on the right side, and 2+ on the left side.       Dorsalis pedis pulses are 2+ on the  right side, and 2+ on the left side.  Pulmonary/Chest: Effort normal and breath sounds normal.  Abdominal: Soft. There is no tenderness.  Musculoskeletal:       Left wrist: She exhibits laceration. She exhibits no tenderness.       Left hip: She exhibits tenderness (over pelvis, left). She exhibits normal range of motion.       Left knee: She exhibits normal range of motion. Tenderness found.       Arms:      Left upper leg: She exhibits tenderness.  Neurological: She is alert and oriented to person, place, and time.  Skin: Skin is warm and dry.  Nursing note and vitals reviewed.    ED Treatments / Results  DIAGNOSTIC STUDIES:  Oxygen Saturation is 95% on RA, normal by my interpretation.    COORDINATION OF CARE:  1:20 AM Discussed treatment plan with pt at bedside and pt agreed to plan.   Labs (all labs ordered are listed, but only abnormal results are displayed) Labs Reviewed  BASIC METABOLIC PANEL - Abnormal; Notable for the following:       Result Value   Sodium 133 (*)    Chloride 97 (*)    Glucose, Bld 172 (*)    Creatinine, Ser 1.01 (*)    GFR calc non Af Amer 49 (*)    GFR calc Af Amer 57 (*)    All other components within normal limits  CBC WITH DIFFERENTIAL/PLATELET - Abnormal; Notable for the following:    WBC 22.3 (*)    Neutro Abs 17.1 (*)    Monocytes Absolute 1.4 (*)    All other components within normal limits  URINE CULTURE  URINALYSIS, ROUTINE W REFLEX MICROSCOPIC (NOT AT [facility])  BASIC METABOLIC PANEL    EKG  EKG Interpretation None       Radiology Ct Head Wo Contrast  Result Date: 06/09/2016 CLINICAL DATA:  [place] and hit head EXAM: CT HEAD WITHOUT CONTRAST TECHNIQUE: Contiguous axial images were obtained from the base of the skull through the vertex without intravenous contrast.  COMPARISON:  12/18/2015, 08/30/2014 FINDINGS: Brain: No acute territorial infarction, intracranial hemorrhage or extra-axial fluid collection is seen. There is no focal  mass, mass effect or midline shift. Mild to moderate periventricular, subcortical, and deep white matter hypodensities consistent with small vessel disease, similar in distribution compared to previous exam. Mild cortical atrophy. Ventricles are nonenlarged. Vascular: No hyperdense vessels.  Carotid artery calcifications. Skull: Mastoid air cells are clear.  No skull fracture is seen. Sinuses/Orbits: Paranasal sinuses show mild mucosal thickening. There is no acute orbital abnormality. Other: None IMPRESSION: 1. No CT evidence for acute intracranial abnormality. 2. Mild to moderate small vessel ischemic disease of the white matter. Electronically Signed   By: Donavan Foil M.D.   On: 06/09/2016 21:57   Dg Knee Complete 4 Views Left  Result Date: 06/09/2016 CLINICAL DATA:  Fall with pain EXAM: LEFT KNEE - COMPLETE 4+ VIEW COMPARISON:  None. FINDINGS: No acute displaced fracture or malalignment. Mild narrowing of the medial and lateral joint space compartments. No large effusion. Disc space calcification consistent with chondrocalcinosis. IMPRESSION: 1. No acute fracture or malalignment 2. Chondrocalcinosis Electronically Signed   By: Donavan Foil M.D.   On: 06/09/2016 22:03   Dg Hip Unilat With Pelvis 2-3 Views Left  Result Date: 06/09/2016 CLINICAL DATA:  Fall left hip to knee pain EXAM: DG HIP (WITH OR WITHOUT PELVIS) 2-3V LEFT COMPARISON:  None. FINDINGS: Calcified phleboliths in the pelvis. Postsurgical changes over the abdomen. The SI joints do not appear widened. No dislocation is evident. There is a comminuted right superior pubic ramus fracture with extension of fracture lucency to the right pubic symphysis. There is a nondisplaced but slightly overriding left inferior pubic ramus fracture. There is a minimally displaced left superior pubic ramus fracture. IMPRESSION: 1. Comminuted right superior pubic ramus fracture with fracture lucency present in the right pubic symphysis. 2. Nondisplaced but  slightly overriding fracture of the left inferior pubic ramus. Mildly displaced fracture involving the left superior pubic ramus. Electronically Signed   By: Donavan Foil M.D.   On: 06/09/2016 22:01   Dg Femur Min 2 Views Left  Result Date: 06/09/2016 CLINICAL DATA:  Fall with pain EXAM: LEFT FEMUR 2 VIEWS COMPARISON:  None. FINDINGS: Mildly displaced left superior pubic ramus fracture. Nondisplaced but slightly overriding left inferior pubic ramus fracture. Left femoral head demonstrates normal positioning. There is no left femoral fracture identified. IMPRESSION: 1. No acute displaced left femoral fracture 2. Left superior and inferior pubic rami fractures. Electronically Signed   By: Donavan Foil M.D.   On: 06/09/2016 22:04    Procedures Procedures (including critical care time)  Medications Ordered in ED Medications  cephALEXin (KEFLEX) capsule 500 mg (not administered)  simethicone (MYLICON) chewable tablet 80 mg (not administered)  levothyroxine (SYNTHROID, LEVOTHROID) tablet 25 mcg (not administered)  venlafaxine XR (EFFEXOR-XR) 24 hr capsule 75 mg (not administered)  diazepam (VALIUM) tablet 5 mg (not administered)  promethazine (PHENERGAN) tablet 12.5 mg (not administered)  lipase/protease/amylase (CREON) capsule 24,000 Units (not administered)  memantine (NAMENDA) tablet 10 mg (not administered)  citalopram (CELEXA) tablet 10 mg (not administered)  metoprolol tartrate (LOPRESSOR) tablet 12.5 mg (not administered)  acetaminophen (TYLENOL) tablet 650 mg (not administered)  ipratropium (ATROVENT HFA) inhaler 2 puff (not administered)  enoxaparin (LOVENOX) injection 40 mg (not administered)  0.9 %  sodium chloride infusion (not administered)  acetaminophen (TYLENOL) tablet 650 mg (not administered)    Or  acetaminophen (TYLENOL) suppository 650 mg (not administered)  HYDROcodone-acetaminophen (NORCO/VICODIN) 5-325  MG per tablet 1-2 tablet (not administered)  polyethylene glycol  (MIRALAX / GLYCOLAX) packet 17 g (not administered)  ondansetron (ZOFRAN) tablet 4 mg (not administered)    Or  ondansetron (ZOFRAN) injection 4 mg (not administered)  insulin aspart (novoLOG) injection 0-9 Units (not administered)  insulin aspart (novoLOG) injection 0-5 Units (not administered)  HYDROmorphone (DILAUDID) injection 0.5-1 mg (not administered)  fentaNYL (SUBLIMAZE) injection 50 mcg (50 mcg Intravenous Given 06/09/16 2040)  fentaNYL (SUBLIMAZE) injection 50 mcg (50 mcg Intravenous Given 06/09/16 2216)  fentaNYL (SUBLIMAZE) injection 50 mcg (50 mcg Intravenous Given 06/10/16 0108)     Initial Impression / Assessment and Plan / ED Course  I have reviewed the triage vital signs and the nursing notes.  Pertinent labs & imaging results that were available during my care of the patient were reviewed by me and considered in my medical decision making (see chart for details).  Clinical Course as of Jun 11 119  Nancy Fetter Jun 09, 2016  2023 Xrays, CT head, Iv fentanyl for pain. Mechanical fall. Hip ROM is ok but causes pain. Length equal. Probably a pelvis injury  [SG]    Clinical Course User Index [SG] Sherwood Gambler, MD    Dr. Ninfa Linden recommends WBAT. Her pain is poorly controlled. She wants to hold on medicines now but would not be able to walk, due to age with this will admit. Dr. Myna Hidalgo to admit. Her wrist cellulitis feels and looks much better than 2 days ago.  Final Clinical Impressions(s) / ED Diagnoses   Final diagnoses:  Fall, initial encounter  Closed bilateral fracture of pubic rami, initial encounter ([place])  Leukocytosis  Hypoxia    New Prescriptions New Prescriptions   No medications on file   I personally performed the services described in this documentation, which was scribed in my presence. The recorded information has been reviewed and is accurate.     Sherwood Gambler, MD 06/10/16 [phone]

## 2016-06-09 NOTE — ED Notes (Signed)
Pt has extremely cold hands, reports that "they always have problems getting my oxygen" after warming pt's hands, pulse ox increased to 95% RA

## 2016-06-09 NOTE — ED Triage Notes (Addendum)
Pt was reaching into her refrigerator when she slipped and fell, c/o pain to left hip  area, cms intact distal, pt also admits so hitting her head, denies any LOC,

## 2016-06-09 NOTE — ED Notes (Signed)
Pt states she went to reach for fridge & fell onto her left side. Pt says she did hit her head denies any LOC.

## 2016-06-10 ENCOUNTER — Encounter (HOSPITAL_COMMUNITY): Payer: Self-pay | Admitting: Family Medicine

## 2016-06-10 DIAGNOSIS — S32592A Other specified fracture of left pubis, initial encounter for closed fracture: Secondary | ICD-10-CM | POA: Diagnosis not present

## 2016-06-10 DIAGNOSIS — J449 Chronic obstructive pulmonary disease, unspecified: Secondary | ICD-10-CM | POA: Diagnosis not present

## 2016-06-10 DIAGNOSIS — E119 Type 2 diabetes mellitus without complications: Secondary | ICD-10-CM

## 2016-06-10 DIAGNOSIS — W19XXXA Unspecified fall, initial encounter: Secondary | ICD-10-CM | POA: Diagnosis not present

## 2016-06-10 DIAGNOSIS — S32591A Other specified fracture of right pubis, initial encounter for closed fracture: Secondary | ICD-10-CM

## 2016-06-10 DIAGNOSIS — S32511A Fracture of superior rim of right pubis, initial encounter for closed fracture: Secondary | ICD-10-CM | POA: Diagnosis not present

## 2016-06-10 DIAGNOSIS — Y92009 Unspecified place in unspecified non-institutional (private) residence as the place of occurrence of the external cause: Secondary | ICD-10-CM

## 2016-06-10 DIAGNOSIS — I10 Essential (primary) hypertension: Secondary | ICD-10-CM

## 2016-06-10 DIAGNOSIS — S329XXA Fracture of unspecified parts of lumbosacral spine and pelvis, initial encounter for closed fracture: Secondary | ICD-10-CM | POA: Diagnosis present

## 2016-06-10 DIAGNOSIS — D72829 Elevated white blood cell count, unspecified: Secondary | ICD-10-CM | POA: Diagnosis present

## 2016-06-10 DIAGNOSIS — F418 Other specified anxiety disorders: Secondary | ICD-10-CM

## 2016-06-10 DIAGNOSIS — E871 Hypo-osmolality and hyponatremia: Secondary | ICD-10-CM

## 2016-06-10 DIAGNOSIS — Y92099 Unspecified place in other non-institutional residence as the place of occurrence of the external cause: Secondary | ICD-10-CM

## 2016-06-10 LAB — GLUCOSE, CAPILLARY
GLUCOSE-CAPILLARY: 125 mg/dL — AB (ref 65–99)
Glucose-Capillary: 167 mg/dL — ABNORMAL HIGH (ref 65–99)
Glucose-Capillary: 93 mg/dL (ref 65–99)

## 2016-06-10 LAB — CBC
HCT: 36.6 % (ref 36.0–46.0)
HEMOGLOBIN: 11.8 g/dL — AB (ref 12.0–15.0)
MCH: 30.5 pg (ref 26.0–34.0)
MCHC: 32.2 g/dL (ref 30.0–36.0)
MCV: 94.6 fL (ref 78.0–100.0)
Platelets: 229 10*3/uL (ref 150–400)
RBC: 3.87 MIL/uL (ref 3.87–5.11)
RDW: 15 % (ref 11.5–15.5)
WBC: 12.1 10*3/uL — ABNORMAL HIGH (ref 4.0–10.5)

## 2016-06-10 LAB — BASIC METABOLIC PANEL
Anion gap: 8 (ref 5–15)
BUN: 15 mg/dL (ref 6–20)
CHLORIDE: 97 mmol/L — AB (ref 101–111)
CO2: 29 mmol/L (ref 22–32)
CREATININE: 0.84 mg/dL (ref 0.44–1.00)
Calcium: 8.8 mg/dL — ABNORMAL LOW (ref 8.9–10.3)
GFR calc Af Amer: >60 mL/min (ref >60)
GFR calc non Af Amer: >60 mL/min (ref >60)
GLUCOSE: 163 mg/dL — AB (ref 65–99)
Potassium: 4.2 mmol/L (ref 3.5–5.1)
Sodium: 134 mmol/L — ABNORMAL LOW (ref 135–145)

## 2016-06-10 MED ORDER — LEVOTHYROXINE SODIUM 50 MCG PO TABS
25.0000 ug | ORAL_TABLET | Freq: Every day | ORAL | Status: DC
  Administered 2016-06-10: 25 ug via ORAL
  Filled 2016-06-10: qty 1

## 2016-06-10 MED ORDER — ACETAMINOPHEN 325 MG PO TABS
650.0000 mg | ORAL_TABLET | Freq: Four times a day (QID) | ORAL | Status: DC | PRN
Start: 2016-06-10 — End: 2016-06-10

## 2016-06-10 MED ORDER — INSULIN ASPART 100 UNIT/ML ~~LOC~~ SOLN: 0.0000 [IU] | Freq: Every day | SUBCUTANEOUS | Status: DC

## 2016-06-10 MED ORDER — ACETAMINOPHEN 650 MG RE SUPP: 650.0000 mg | Freq: Four times a day (QID) | RECTAL | Status: DC | PRN

## 2016-06-10 MED ORDER — ACETAMINOPHEN 500 MG PO TABS
500.0000 mg | ORAL_TABLET | ORAL | Status: DC
  Administered 2016-06-10: 500 mg via ORAL
  Filled 2016-06-10: qty 1

## 2016-06-10 MED ORDER — HYDRALAZINE HCL 20 MG/ML IJ SOLN: 10.0000 mg | INTRAMUSCULAR | Status: DC | PRN

## 2016-06-10 MED ORDER — SIMETHICONE 80 MG PO CHEW: 80.0000 mg | CHEWABLE_TABLET | Freq: Four times a day (QID) | ORAL | Status: DC | PRN

## 2016-06-10 MED ORDER — TRAMADOL HCL 50 MG PO TABS: 50.0000 mg | ORAL_TABLET | Freq: Two times a day (BID) | ORAL | 0 refills | Status: DC | PRN

## 2016-06-10 MED ORDER — INSULIN ASPART 100 UNIT/ML ~~LOC~~ SOLN: 0.0000 [IU] | Freq: Three times a day (TID) | SUBCUTANEOUS | Status: DC

## 2016-06-10 MED ORDER — CITALOPRAM HYDROBROMIDE 20 MG PO TABS
10.0000 mg | ORAL_TABLET | Freq: Every day | ORAL | Status: DC
  Administered 2016-06-10: 10 mg via ORAL
  Filled 2016-06-10 (×2): qty 1

## 2016-06-10 MED ORDER — VENLAFAXINE HCL ER 37.5 MG PO CP24
75.0000 mg | ORAL_CAPSULE | Freq: Every day | ORAL | Status: DC
  Administered 2016-06-10: 75 mg via ORAL
  Filled 2016-06-10 (×3): qty 2

## 2016-06-10 MED ORDER — HYDROCODONE-ACETAMINOPHEN 5-325 MG PO TABS: 1.0000 | ORAL_TABLET | ORAL | Status: DC | PRN

## 2016-06-10 MED ORDER — ACETAMINOPHEN 325 MG PO TABS: 650.0000 mg | ORAL_TABLET | Freq: Four times a day (QID) | ORAL | Status: DC | PRN

## 2016-06-10 MED ORDER — MEMANTINE HCL 10 MG PO TABS
10.0000 mg | ORAL_TABLET | Freq: Every day | ORAL | Status: DC
  Administered 2016-06-10: 10 mg via ORAL
  Filled 2016-06-10: qty 1

## 2016-06-10 MED ORDER — FENTANYL CITRATE (PF) 100 MCG/2ML IJ SOLN
50.0000 ug | Freq: Once | INTRAMUSCULAR | Status: AC
  Administered 2016-06-10: 50 ug via INTRAVENOUS
  Filled 2016-06-10: qty 2

## 2016-06-10 MED ORDER — ONDANSETRON HCL 4 MG PO TABS: 4.0000 mg | ORAL_TABLET | Freq: Four times a day (QID) | ORAL | Status: DC | PRN

## 2016-06-10 MED ORDER — IPRATROPIUM BROMIDE 0.02 % IN SOLN
2.5000 mL | Freq: Three times a day (TID) | RESPIRATORY_TRACT | Status: DC
  Administered 2016-06-10 (×2): 0.5 mg via RESPIRATORY_TRACT
  Filled 2016-06-10 (×2): qty 2.5

## 2016-06-10 MED ORDER — IPRATROPIUM BROMIDE 0.02 % IN SOLN: 2.5000 mL | Freq: Four times a day (QID) | RESPIRATORY_TRACT | Status: DC

## 2016-06-10 MED ORDER — CEPHALEXIN 500 MG PO CAPS: 500.0000 mg | ORAL_CAPSULE | Freq: Two times a day (BID) | ORAL | Status: DC

## 2016-06-10 MED ORDER — METOPROLOL TARTRATE 25 MG PO TABS
12.5000 mg | ORAL_TABLET | Freq: Two times a day (BID) | ORAL | Status: DC
  Administered 2016-06-10 (×2): 12.5 mg via ORAL
  Filled 2016-06-10 (×2): qty 1

## 2016-06-10 MED ORDER — SODIUM CHLORIDE 0.9 % IV SOLN
INTRAVENOUS | Status: DC
  Administered 2016-06-10: 02:00:00 via INTRAVENOUS

## 2016-06-10 MED ORDER — PROMETHAZINE HCL 12.5 MG PO TABS
12.5000 mg | ORAL_TABLET | Freq: Four times a day (QID) | ORAL | Status: DC | PRN
Start: 2016-06-10 — End: 2016-06-10

## 2016-06-10 MED ORDER — PANCRELIPASE (LIP-PROT-AMYL) 12000-38000 UNITS PO CPEP
24000.0000 [IU] | ORAL_CAPSULE | Freq: Three times a day (TID) | ORAL | Status: DC
  Administered 2016-06-10 (×2): 24000 [IU] via ORAL
  Filled 2016-06-10 (×2): qty 2

## 2016-06-10 MED ORDER — ONDANSETRON HCL 4 MG/2ML IJ SOLN
4.0000 mg | Freq: Four times a day (QID) | INTRAMUSCULAR | Status: DC | PRN
  Administered 2016-06-10: 4 mg via INTRAVENOUS
  Filled 2016-06-10: qty 2

## 2016-06-10 MED ORDER — DIAZEPAM 5 MG PO TABS
5.0000 mg | ORAL_TABLET | Freq: Every day | ORAL | Status: DC
  Administered 2016-06-10: 5 mg via ORAL
  Filled 2016-06-10: qty 1

## 2016-06-10 MED ORDER — POLYETHYLENE GLYCOL 3350 17 G PO PACK: 17.0000 g | PACK | Freq: Every day | ORAL | Status: DC | PRN

## 2016-06-10 MED ORDER — HYDROMORPHONE HCL 1 MG/ML IJ SOLN
0.5000 mg | INTRAMUSCULAR | Status: DC | PRN
  Administered 2016-06-10: 1 mg via INTRAVENOUS
  Filled 2016-06-10: qty 1

## 2016-06-10 MED ORDER — ENOXAPARIN SODIUM 30 MG/0.3ML ~~LOC~~ SOLN
30.0000 mg | SUBCUTANEOUS | Status: DC
  Administered 2016-06-10: 30 mg via SUBCUTANEOUS
  Filled 2016-06-10: qty 0.3

## 2016-06-10 MED ORDER — TRAMADOL HCL 50 MG PO TABS: 50.0000 mg | ORAL_TABLET | Freq: Two times a day (BID) | ORAL | Status: DC | PRN

## 2016-06-10 NOTE — Care Management Note (Signed)
Case Management Note  Patient Details  Name: LAKESA COSTE MRN: [id number] Date of Birth: February 05, 1929  Subjective/Objective:                  Pt is from home, admitted after falling and fx her pelvis. Pt has aids and 24/7 care. She has used [facility] in the past and requests they be referred again. Pt's aid and niece are at the bedside. They are aware that [facility] has 48hrs to make first visit. Pt states she has all necessary DME PTA. Romualdo Bolk, of [facility], aware of referral and will obtain pt info from chart.   Action/Plan: Pt discharging home today with [facility].   Expected Discharge Date:  06/10/16               Expected Discharge Plan:  [place]  In-House Referral:  NA  Discharge planning Services  CM Consult  Post Acute Care Choice:  Home Health Choice offered to:  Patient HH Arranged:  RN, PT [facility] Agency:  [place]  Status of Service:  Completed, signed off  Sherald Barge, RN 06/10/2016, 2:29 PM

## 2016-06-10 NOTE — Evaluation (Signed)
Physical Therapy Evaluation Patient Details Name: [name] MRN: [id number] DOB: May 31, 1929 Today's Date: 06/10/2016   History of Present Illness  82 year-old woman presented with a history of dementia, depression, anxiety, and COPD, presented with complaints of hip pain after a mechanical fall. Hip x-ray revealed bilateral pelvic fractures. Orthopedics recommended weight-bear as tolerated. She was admitted for further management and intractable pain.   Clinical Impression  Pt received in bed, Niece present, and aide arrived during PT evaluation.  Pt is agreeable to PT evaluation, however she is hesitant about beginning mobility.  Pt states she is normally modified independent with ambulation using a cane at home.  She has aides 8 hours a day 7 days per week that assist with all ADL's, and IADL's, and she lives with her husband.  Pt requires Max A for bed mobility, Mod A for sit<>stand, and Mod A for gait x 44f with RW.  Aide expressed that she would be able to handle the increased assistance at home.  Pt was greatly limited due to pain, and then nausea during mobility.  Recommend that pt d/c home with continued 24/7 supervision/assistance, as well as HHPT.      Follow Up Recommendations Home health PT;Supervision/Assistance - 24 hour    Equipment Recommendations  None recommended by PT (Pt has all DME needed. )    Recommendations for Other Services       Precautions / Restrictions Precautions Precautions: Fall Precaution Comments: 2 falls in the past 6 months.  Restrictions Weight Bearing Restrictions: No      Mobility  Bed Mobility Overal bed mobility: Needs Assistance Bed Mobility: Supine to Sit     Supine to sit: [facility];Max assist (increased time.  Assist to move each LE to the EOB, then assist via bed pad to scoot hips to the EOB.  )        Transfers Overall transfer level: Needs assistance Equipment used: Rolling walker (2 wheeled) Transfers: Sit to/from  Stand Sit to Stand: Mod assist (Poor ability with anterior weight shift needed to power up into standing. )            Ambulation/Gait Ambulation/Gait assistance: Mod assist Ambulation Distance (Feet): 15 Feet Assistive device: Rolling walker (2 wheeled) Gait Pattern/deviations: Step-to pattern;Trunk flexed   Gait velocity interpretation: <1.8 ft/sec, indicative of risk for recurrent falls General Gait Details: increased time, need for assistance with weight shifting due to pain and need for stability when weight is shifted on the L LE.   Pt requires assistance for RW navigation.   Distance limited due to increased pain, as well as nausea.  RN called to request medication.   Stairs            Wheelchair Mobility    Modified Rankin (Stroke Patients Only)       Balance Overall balance assessment: History of Falls;Needs assistance Sitting-balance support: Bilateral upper extremity supported;Feet supported Sitting balance-Leahy Scale: Fair     Standing balance support: Bilateral upper extremity supported Standing balance-Leahy Scale: Zero                               Pertinent Vitals/Pain Pain Assessment: 0-10 Pain Score: 8  Pain Location: L hip and groin.  Pain Descriptors / Indicators: Aching Pain Intervention(s): Limited activity within patient's tolerance;Repositioned;Monitored during session    Home Living   Living Arrangements: Spouse/significant other (Pt states that her husband is "sick" too) Available  Help at Discharge: Personal care attendant (8am - 4pm 7 days per week.  ) Type of Home: House Home Access: Stairs to enter   [employer] of Steps: front: 4 steps, Side: 8 both sets of steps have a railing  Home Layout: One level;Laundry or work area in basement;Able to live on main level with bedroom/bathroom Home Equipment: Environmental consultant - 2 wheels;Bedside commode;Cane - single point;Shower seat;Wheelchair - manual;Grab bars -  tub/shower;Grab bars - toilet      Prior Function     Gait / Transfers Assistance Needed: pt states she is independent with gait, but uses the RW or the cane to do so.    ADL's / Homemaking Assistance Needed: Aides assist with dressing and bathing, cooking and cleaning, assist with running errands.          Hand Dominance   Dominant Hand: Right    Extremity/Trunk Assessment   Upper Extremity Assessment: Generalized weakness           Lower Extremity Assessment: Generalized weakness         Communication   Communication:  (lethargic.)  Cognition Arousal/Alertness: Lethargic Behavior During Therapy: WFL for tasks assessed/performed Overall Cognitive Status: Within Functional Limits for tasks assessed                      General Comments      Exercises     Assessment/Plan    PT Assessment Patient needs continued PT services  PT Problem List Decreased strength;Decreased activity tolerance;Decreased balance;Decreased mobility;Decreased knowledge of use of DME;Decreased safety awareness;Decreased knowledge of precautions          PT Treatment Interventions DME instruction;Gait training;Functional mobility training;Therapeutic activities;Therapeutic exercise;Balance training;Patient/family education    PT Goals (Current goals can be found in the Care Plan section)  Acute Rehab PT Goals Patient Stated Goal: Pt wants to go home, and have less pain.  PT Goal Formulation: With patient/family Time For Goal Achievement: 06/17/16 Potential to Achieve Goals: Fair    Frequency Min 5X/week   Barriers to discharge        Co-evaluation               End of Session Equipment Utilized During Treatment: Gait belt Activity Tolerance: Patient limited by pain Patient left: in chair;with call bell/phone within reach;with nursing/sitter in room;with family/visitor present Nurse Communication: Mobility status Lattie Haw, RN notified of pt's mobility status, need  for [facility] in the room, as well as pt's location, and request for pain/nausea meds. )    Functional Assessment Tool Used: [employer] "6-clicks"  Functional Limitation: Mobility: Walking and moving around Mobility: Walking and Moving Around Current Status [phone]): At least 60 percent but less than 80 percent impaired, limited or restricted Mobility: Walking and Moving Around Goal Status [phone]): At least 40 percent but less than 60 percent impaired, limited or restricted    Time: 1105-1205 PT Time Calculation (min) (ACUTE ONLY): 60 min   Charges:   PT Evaluation $PT Eval Low Complexity: 1 Procedure PT Treatments $Gait Training: 8-22 mins $Therapeutic Activity: 8-22 mins   PT G Codes:   PT G-Codes **NOT FOR INPATIENT CLASS** Functional Assessment Tool Used: [employer] "6-clicks"  Functional Limitation: Mobility: Walking and moving around Mobility: Walking and Moving Around Current Status [phone]): At least 60 percent but less than 80 percent impaired, limited or restricted Mobility: Walking and Moving Around Goal Status [phone]): At least 40 percent but less than 60 percent impaired, limited or  restricted    Tacy Learn, PT, DPT X: [phone]

## 2016-06-10 NOTE — Progress Notes (Signed)
Pt's IV catheter removed and intact. Pt's IV site clean dry and intact. Discharge instructions including medications and follow up appointments were reviewed and discussed with patient's niece. All questions were answered and no further questions at this time. Pt's Niece verbalized understanding of discharge instructions. Pt in stable condition and in no acute distress. Pt escorted by nurse tech.

## 2016-06-10 NOTE — Progress Notes (Signed)
SATURATION QUALIFICATIONS: (This note is used to comply with regulatory documentation for home oxygen)  Patient Saturations on Room Air at Rest =86%   

## 2016-06-10 NOTE — Care Management Obs Status (Signed)
[facility] NOTIFICATION   Patient Details  Name: [name] MRN: [id number] Date of Birth: October 15, 1928   Medicare Observation Status Notification Given:  Yes    Sherald Barge, RN 06/10/2016, 2:28 PM

## 2016-06-10 NOTE — H&P (Signed)
History and Physical    NOVALYNN BRANAMAN GUY:[id number] DOB: [birth year] DOA: 06/09/2016  PCP: [name], MD   Patient coming from: Home  Chief Complaint: Hip pain after fall at home  HPI: [name] is a 82 y.o. female with medical history significant for dementia, depression, anxiety, and COPD who presents to the emergency department with hip pain after a ground-level mechanical fall at home. Patient was recently diagnosed with cellulitis involving the right wrist but this had been resolving with antibiotics at home and she was otherwise in her usual state of health just prior to arrival when she was reaching for something and fell backwards, landing onto her left hip and hand, and bumping her head. She denies any loss of consciousness and is not anticoagulated. She reports immediate pain at the hips and has had trouble walking due to severe pain. She denies any recent fevers or chills, denies significant headache, change in vision or hearing, loss of coordination, or focal numbness or weakness. She describes her pain as severe, localized to the bilateral hips, worse on the left, worse with any movement or pressure, and with no alleviating factors identified.    ED Course: Upon arrival to the ED, patient is found to be afebrile, saturating low 90s on room air, hypertensive to 180/80, and with vitals otherwise stable. Head CT is negative for acute intracranial abnormality, radiographs of the left femur and left knee are negative for acute injury, but radiographs of the hips are notable for a comminuted right superior pubic ramus fracture, nondisplaced left inferior pubic ramus fracture, and mildly displaced fracture of the superior left pubic ramus. Chemistry panel features a mild hyponatremia and hypochloremia, and a mild elevation in serum creatinine 1.01. CBC is notable for a leukocytosis to 56,387 with neutrophilic predominance. Orthopedic surgery was consulted by the ED physician and did was  advised that the patient should be weightbearing as tolerated. Patient was unable to bear weight in the ED due to significant pain and she was treated with IV fentanyl. She remained hemodynamically stable and in no apparent respiratory distress in the ED and will be observed on the medical-surgical unit for ongoing evaluation and management of pubic rami fractures with intractable pain.  Review of Systems:  All other systems reviewed and apart from HPI, are negative.  Past Medical History:  Diagnosis Date  . Anemia   . 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 Chronic abdominal pain   . Chronic neck and back pain   . COPD (chronic obstructive pulmonary disease) ([place])   . GAD (generalized anxiety disorder)   . Gastroesophageal reflux disease   . Gastroparesis   . IBS (irritable bowel syndrome)   . Lung mass   . Migraines   . Osteoporosis   . 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 Renal insufficiency   . S/P colostomy ([place])   . Tachycardia     Past Surgical History:  Procedure Laterality Date  . ABDOMINAL HYSTERECTOMY    . APPENDECTOMY    . CHOLECYSTECTOMY    . ESOPHAGOGASTRODUODENOSCOPY    . ESOPHAGOGASTRODUODENOSCOPY N/A 08/25/2015   Procedure: ESOPHAGOGASTRODUODENOSCOPY (EGD);  Surgeon: Rogene Houston, MD;  [facility];  Service: Endoscopy;  Laterality: N/A;  730  . ILEOSCOPY N/A 08/25/2015   Procedure: ILEOSCOPY THROUGH STOMA;  Surgeon: Rogene Houston, MD;  Location: [facility];  Service: Endoscopy;  Laterality: N/A;  . PARTIAL GASTRECTOMY  1967   3/4 gastric resection for bleeding ulcers  . TONSILLECTOMY  reports that she has never smoked. She has never used smokeless tobacco. She reports that she does not drink alcohol or use drugs.  Allergies  Allergen Reactions  . Levaquin [Levofloxacin] Other (See Comments)    Patient states that she became weak and extremely tired. Also, it made her real dizzy.  . Tetanus Toxoid Hives and Swelling  . Zolpidem Tartrate     Not  in right state of mind  . Fosamax [Alendronate Sodium] Other (See Comments)    Severe gastritis reflux.    Family History  Problem Relation Age of Onset  . Stroke Mother   . Stroke Father   . Diabetes Sister   . Diabetes Brother   . Stroke Brother   . Cancer Sister     unknown type  . Diabetes Sister   . Emphysema Sister   . Diabetes Brother   . Stroke Brother   . Diabetes Son   . Irritable bowel syndrome Son   . Diabetes Son   . Hypertension Son      Prior to Admission medications   Medication Sig Start Date End Date Taking? Authorizing Provider  acetaminophen (TYLENOL) 325 MG tablet Take 2 tablets (650 mg total) by mouth every 6 (six) hours as needed for mild pain or headache (or Fever >/= 101). 12/22/15  Yes Samuella Cota, MD  ATROVENT HFA 17 MCG/ACT inhaler 2 PUFFS FOUR TIMES DAILY. 09/12/15  Yes Kathyrn Drown, MD  cephALEXin (KEFLEX) 500 MG capsule Take 1 capsule (500 mg total) by mouth 2 (two) times daily. 06/07/16  Yes Sherwood Gambler, MD  citalopram (CELEXA) 10 MG tablet TAKE (1) TABLET BY MOUTH ONCE DAILY. Patient taking differently: TAKE (1) TABLET BY MOUTH ONCE DAILY IN THE MORNING 01/18/16  Yes Kathyrn Drown, MD  diazepam (VALIUM) 5 MG tablet Take 0.5 tablets twice a daily as needed. Take sparingly (Prescription must last 30 days) Patient taking differently: Take 5 mg by mouth at bedtime. Take 0.5 tablets twice a daily as needed. Take sparingly (Prescription must last 30 days) 05/01/16  Yes Kathyrn Drown, MD  Doxylamine-DM (ROBITUSSIN NIGHTTIME COUGH DM) 12.5-30 MG/10ML LIQD Take by mouth. Patient taked 1 teaspoon as needed.   Yes Historical Provider, MD  levothyroxine (SYNTHROID, LEVOTHROID) 25 MCG tablet TAKE 2 TABLETS ON MONDAY AND FRIDAY. & 1 TABLET ALL OTHER DAYS. 05/09/16  Yes Kathyrn Drown, MD  lipase/protease/amylase (CREON) 12000 units CPEP capsule Take 2 capsules (24,000 Units total) by mouth 3 (three) times daily with meals. 03/12/16  Yes Rogene Houston,  MD  loperamide (IMODIUM) 2 MG capsule Take 1 capsule (2 mg total) by mouth daily before breakfast. 04/03/16  Yes Rogene Houston, MD  Melatonin 5 MG CAPS Take 5 mg by mouth at bedtime.   Yes Historical Provider, MD  memantine (NAMENDA) 10 MG tablet TAKE (1) TABLET TWICE DAILY. 02/14/16  Yes Kathyrn Drown, MD  metoprolol tartrate (LOPRESSOR) 25 MG tablet TAKE 1/2 TABLET TWICE DAILY FOR HIGH BLOOD PRESSURE. 01/18/16  Yes Kathyrn Drown, MD  potassium chloride (K-DUR) 10 MEQ tablet TAKE (1) TABLET BY MOUTH ONCE DAILY. 10/26/15  Yes Kathyrn Drown, MD  promethazine (PHENERGAN) 12.5 MG tablet Take 1 tablet (12.5 mg total) by mouth every 6 (six) hours as needed for nausea or vomiting. 04/03/16  Yes Rogene Houston, MD  Simethicone (GAS RELIEF PO) Take 1 tablet by mouth 3 (three) times daily after meals.   Yes Historical Provider, MD  venlafaxine XR (  EFFEXOR-XR) 75 MG 24 hr capsule TAKE 1 CAPSULE ONCE DAILY WITH BREAKFAST. 05/09/16  Yes Kathyrn Drown, MD  cefdinir (OMNICEF) 300 MG capsule Take 1 capsule (300 mg total) by mouth 2 (two) times daily. Patient not taking: Reported on 06/07/2016 05/28/16   Kathyrn Drown, MD  sulfamethoxazole-trimethoprim (BACTRIM DS,SEPTRA DS) 800-160 MG tablet Take 1 tablet by mouth 2 (two) times daily. 06/07/16 06/14/16  Sherwood Gambler, MD    Physical Exam: Vitals:   06/09/16 2007 06/09/16 2210 06/09/16 2300 06/10/16 0000  BP:  187/84 162/87 165/89  Pulse:  96 102 99  Resp:  '20 17 18  '$ Temp:      TempSrc:      SpO2: 95% 90% 90% 92%  Weight:      Height:          Constitutional: NAD, calm, in apparent discomfort, cachectic  Eyes: PERTLA, lids and conjunctivae normal ENMT: Mucous membranes are moist. Posterior pharynx clear of any exudate or lesions.   Neck: normal, supple, no masses, no thyromegaly Respiratory: clear to auscultation bilaterally, no wheezing, no crackles. Normal respiratory effort.   Cardiovascular: S1 & S2 heard, regular rate and rhythm. No extremity  edema. No significant JVD. Abdomen: No distension, no tenderness, no masses palpated. Bowel sounds normal.  Musculoskeletal: no clubbing / cyanosis. No joint deformity upper and lower extremities. Hips tender with LE ROM significantly limited by pain. Pulses and sensation to light touch intact in b/l feet.   Skin: no significant rashes, lesions, ulcers. Warm, dry, well-perfused. Neurologic: CN 2-12 grossly intact. Sensation intact, DTR normal. Strength 5/5 in all 4 limbs.  Psychiatric: Normal judgment and insight. Alert and oriented x 3. Normal mood and affect.     Labs on Admission: I have personally reviewed following labs and imaging studies  CBC:  Recent Labs Lab 06/07/16 1952 06/09/16 2230  WBC 9.8 22.3*  NEUTROABS 6.2 17.1*  HGB 12.9 12.3  HCT 40.2 38.5  MCV 95.0 95.3  PLT 268 468   Basic Metabolic Panel:  Recent Labs Lab 06/07/16 1952 06/09/16 2230  NA 136 133*  K 3.8 3.9  CL 98* 97*  CO2 30 28  GLUCOSE 192* 172*  BUN 17 17  CREATININE 0.96 1.01*  CALCIUM 9.2 9.0   GFR: Estimated Creatinine Clearance: 21.4 mL/min (by C-G formula based on SCr of 1.01 mg/dL (H)). Liver Function Tests: No results for input(s): AST, ALT, ALKPHOS, BILITOT, PROT, ALBUMIN in the last 168 hours. No results for input(s): LIPASE, AMYLASE in the last 168 hours. No results for input(s): AMMONIA in the last 168 hours. Coagulation Profile: No results for input(s): INR, PROTIME in the last 168 hours. Cardiac Enzymes: No results for input(s): CKTOTAL, CKMB, CKMBINDEX, TROPONINI in the last 168 hours. BNP (last 3 results) No results for input(s): PROBNP in the last 8760 hours. HbA1C: No results for input(s): HGBA1C in the last 72 hours. CBG: No results for input(s): GLUCAP in the last 168 hours. Lipid Profile: No results for input(s): CHOL, HDL, LDLCALC, TRIG, CHOLHDL, LDLDIRECT in the last 72 hours. Thyroid Function Tests: No results for input(s): TSH, T4TOTAL, FREET4, T3FREE,  THYROIDAB in the last 72 hours. Anemia Panel: No results for input(s): VITAMINB12, FOLATE, FERRITIN, TIBC, IRON, RETICCTPCT in the last 72 hours. Urine analysis:    Component Value Date/Time   COLORURINE YELLOW 01/22/2016 1150   APPEARANCEUR CLEAR 01/22/2016 1150   LABSPEC 1.010 01/22/2016 1150   PHURINE 7.0 01/22/2016 1150   GLUCOSEU NEGATIVE 01/22/2016 1150  HGBUR SMALL (A) 01/22/2016 1150   BILIRUBINUR NEGATIVE 01/22/2016 1150   KETONESUR NEGATIVE 01/22/2016 1150   PROTEINUR NEGATIVE 01/22/2016 1150   UROBILINOGEN 0.2 08/30/2014 2021   NITRITE NEGATIVE 01/22/2016 1150   LEUKOCYTESUR NEGATIVE 01/22/2016 1150   Sepsis Labs: '@LABRCNTIP'$ (procalcitonin:4,lacticidven:4) )No results found for this or any previous visit (from the past 240 hour(s)).   Radiological Exams on Admission: Ct Head Wo Contrast  Result Date: 06/09/2016 CLINICAL DATA:  [place] and hit head EXAM: CT HEAD WITHOUT CONTRAST TECHNIQUE: Contiguous axial images were obtained from the base of the skull through the vertex without intravenous contrast. COMPARISON:  12/18/2015, 08/30/2014 FINDINGS: Brain: No acute territorial infarction, intracranial hemorrhage or extra-axial fluid collection is seen. There is no focal mass, mass effect or midline shift. Mild to moderate periventricular, subcortical, and deep white matter hypodensities consistent with small vessel disease, similar in distribution compared to previous exam. Mild cortical atrophy. Ventricles are nonenlarged. Vascular: No hyperdense vessels.  Carotid artery calcifications. Skull: Mastoid air cells are clear.  No skull fracture is seen. Sinuses/Orbits: Paranasal sinuses show mild mucosal thickening. There is no acute orbital abnormality. Other: None IMPRESSION: 1. No CT evidence for acute intracranial abnormality. 2. Mild to moderate small vessel ischemic disease of the white matter. Electronically Signed   By: Donavan Foil M.D.   On: 06/09/2016 21:57   Dg Knee  Complete 4 Views Left  Result Date: 06/09/2016 CLINICAL DATA:  Fall with pain EXAM: LEFT KNEE - COMPLETE 4+ VIEW COMPARISON:  None. FINDINGS: No acute displaced fracture or malalignment. Mild narrowing of the medial and lateral joint space compartments. No large effusion. Disc space calcification consistent with chondrocalcinosis. IMPRESSION: 1. No acute fracture or malalignment 2. Chondrocalcinosis Electronically Signed   By: Donavan Foil M.D.   On: 06/09/2016 22:03   Dg Hip Unilat With Pelvis 2-3 Views Left  Result Date: 06/09/2016 CLINICAL DATA:  Fall left hip to knee pain EXAM: DG HIP (WITH OR WITHOUT PELVIS) 2-3V LEFT COMPARISON:  None. FINDINGS: Calcified phleboliths in the pelvis. Postsurgical changes over the abdomen. The SI joints do not appear widened. No dislocation is evident. There is a comminuted right superior pubic ramus fracture with extension of fracture lucency to the right pubic symphysis. There is a nondisplaced but slightly overriding left inferior pubic ramus fracture. There is a minimally displaced left superior pubic ramus fracture. IMPRESSION: 1. Comminuted right superior pubic ramus fracture with fracture lucency present in the right pubic symphysis. 2. Nondisplaced but slightly overriding fracture of the left inferior pubic ramus. Mildly displaced fracture involving the left superior pubic ramus. Electronically Signed   By: Donavan Foil M.D.   On: 06/09/2016 22:01   Dg Femur Min 2 Views Left  Result Date: 06/09/2016 CLINICAL DATA:  Fall with pain EXAM: LEFT FEMUR 2 VIEWS COMPARISON:  None. FINDINGS: Mildly displaced left superior pubic ramus fracture. Nondisplaced but slightly overriding left inferior pubic ramus fracture. Left femoral head demonstrates normal positioning. There is no left femoral fracture identified. IMPRESSION: 1. No acute displaced left femoral fracture 2. Left superior and inferior pubic rami fractures. Electronically Signed   By: Donavan Foil M.D.    On: 06/09/2016 22:04    EKG: Not performed, will obtain as appropriate.   Assessment/Plan  1. Fall with bilateral pubic rami fractures - Pt suffered ground-level mechanical fall just PTA and is found to have fractures of pubic rami b/l  - Head CT is negative for acute intracranial abnormality - Orthopedic surgery advised having the  patient weight-bear as tolerated  - Pt remains in significant pain from these injuries and is unable to tolerate movement on admission; she will be observed in hospital for pain-control and PT evaluation/treatment   2. Leukocytosis  - WBC is 22,300 on admission and likely reactive to the fractures  - There is a right wrist cellulitis, but this is under treatment since 11/24 and nearly resolved; leukocytosis was not present on 11/24  - Will check UA; CXR from 11/24 neg for acute consolidation and no worsening in her chronic dyspnea and cough - Culture if febrile   3. COPD - Appears to be stable on admission without distress, cough, or wheeze  - O2 saturation is in low 90s, which appears to be her baseline  - Continue Atrovent and prn albuterol   4. Hypertension  - BP elevated on admission with pain likely contributing  - Continue Lopressor, pain-control, prn hydralazine   5. Type II DM   - A1c 6.6% in June 2017  - Managed with diet-control - Serum glucose is 172 on admission  - Check CBG with meals and qHS - Start a low-intensity SSI correctional    6. Hyponatremia  - Serum sodium 133 in setting of apparent dehydration  - She will be given a gentle IVF hydration overnight and BMP will be repeated in am   7. Depression, anxiety  - Stable, continue Effexor, Celexa, Valium    8. Mild non-purulent cellulitis of right wrist - Has nearly resolved, will continue Keflex to complete the course    DVT prophylaxis: sq Lovenox  Code Status: DNR Family Communication: Daughter updated at bedside Disposition Plan: Observe on med-surg Consults called:  Orthopedic surgery consulted by ED physician Admission status: Observation    Vianne Bulls, MD [facility] Pager [phone]  If 7PM-7AM, please contact night-coverage [email or web address] Password [email or web address]  06/10/2016, 1:03 AM

## 2016-06-10 NOTE — Progress Notes (Signed)
PROGRESS NOTE  LOYOLA [name]:[id number] DOB: 09-21-1928 DOA: 06/09/2016 PCP: [name], MD  Brief Narrative: 82 year-old woman presented with a history of dementia, depression, anxiety, and COPD, presented with complaints of hip pain after a mechanical fall. Hip x-ray revealed bilateral pelvic fractures. Orthopedics recommended weight-bear as tolerated. She was admitted for further management and intractable pain.   Assessment/Plan: 1. Mechanical fall with bilateral pubic rami fractures. Orthopedic surgery recommended WBAT. 2. Leukocytosis. Resolving, secondary to demargination, fracture. No evidence of infection.  3. Hyponatremia. Mild. Chloride also low suggesting dehydration. Creatinine normal. Asymptomatic. 4. COPD. Appears compensated at this time. Continue on Atrovent and albuterol as needed.  5. Hypertension. Pressures are stable. Continue Lopressor   6. DM type 2. Past A1c was 6.6% in June of 2017. Stable as inpatient. 7. Anxiety/Depression. Appears to be stable. Continue Effexor, Celexa, and Valium.  8. Mild non-pruluent cellulitis of the right wrist. Appears to be nearly resolved. Will continue antibiotic Keflex to complete the course.    Medically stable, pain improved. Awaiting physical therapy consult.   Discussed pain management with patient, rec scheduled Tylenol (limit to 4 gms/day) and PRN Tramadol. Counseled on ADR including falls, confusion, sedation.  WBAT and f/u with Dr. Aline Brochure orthopedics as an outpatient.  DVT prophylaxis: SCDs  Code Status: DNR  Family Communication: Niece bedside Disposition Plan: Discharge home once improved.   Murray Hodgkins, MD  [facility] Direct contact: [phone] --Via amion app OR  --[email or web address]; password [email or web address]  7PM-7AM contact night coverage as above 06/10/2016, 7:10 AM  LOS: 0 days   Consultants:  Physical therapy    Procedures:  None   Antimicrobials:  Keflex 11/28 >> 12/3   Interval  history/Subjective: Feeling better today, less pain.  ROS: No nausea or vomiting   Objective: Vitals:   06/10/16 0000 06/10/16 0213 06/10/16 0218 06/10/16 0541  BP: 165/89 (!) 171/77  116/67  Pulse: 99 91 94 74  Resp: '18 18 16 16  '$ Temp:  98.5 F (36.9 C)  98.1 F (36.7 C)  TempSrc:  Oral  Oral  SpO2: 92% 92% 94% 92%  Weight:  34 kg (75 lb)    Height:  '4\' 9"'$  (1.448 m)     No intake or output data in the 24 hours ending 06/10/16 0710   Filed Weights   06/09/16 2001 06/10/16 0213  Weight: 33.3 kg (73 lb 6 oz) 34 kg (75 lb)    Exam:    Constitutional:  . Appears calm and comfortable. Very thin, appears frail. Respiratory:  . CTA bilaterally, no w/r/r.  . Respiratory effort normal.   Cardiovascular:  . RRR, no m/r/g . No LE extremity edema   2+ DP pulses bilaterally Musculoskeletal:  . Able to lift both legs off the bed, limited by pain.  Skin:  . BLE no rash or lesions noted. Nontender. No nodules. Right wrist erythema noted, no wound or fluctuance. Nontender. Hand appears uninvolved.  I have personally reviewed following labs and imaging studies:  Sodium 134, Cl- 97; remainder BMP unremarkable. Glucose 125.  White blood cell count improved to 12.1.   Scheduled Meds: . [START ON 06/11/2016] cephALEXin  500 mg Oral BID  . citalopram  10 mg Oral Daily  . diazepam  5 mg Oral QHS  . enoxaparin (LOVENOX) injection  30 mg Subcutaneous Q24H  . insulin aspart  0-5 Units Subcutaneous QHS  . insulin aspart  0-9 Units Subcutaneous TID WC  . ipratropium  2.5 mL Inhalation TID  .  levothyroxine  25 mcg Oral QAC breakfast  . lipase/protease/amylase  24,000 Units Oral TID WC  . memantine  10 mg Oral Daily  . metoprolol tartrate  12.5 mg Oral BID  . venlafaxine XR  75 mg Oral Q breakfast   Continuous Infusions: . sodium chloride 65 mL/hr at 06/10/16 0205    Principal Problem:   Pubic ramus fracture, left, closed, initial encounter [facility]) Active Problems:    Hypothyroidism   Adult failure to thrive   Depression with anxiety   Pubic ramus fracture, right, closed, initial encounter ([place])   COPD (chronic obstructive pulmonary disease) ([place])   Hypertension   Diabetes mellitus ([place])   Hyponatremia   Leukocytosis   Fall at home   Pelvic fracture [facility])   Closed bilateral fracture of pubic rami ([place])   Fall   LOS: 0 days      By signing my name below, I, Collene Leyden, attest that this documentation has been prepared under the direction and in the presence of Murray Hodgkins, MD. Electronically signed: Collene Leyden, Scribe. 06/10/16 9:13 AM   I personally performed the services described in this documentation. All medical record entries made by the scribe were at my direction. I have reviewed the chart and agree that the record reflects my personal performance and is accurate and complete. Murray Hodgkins, MD

## 2016-06-10 NOTE — Discharge Summary (Addendum)
Physician Discharge Summary  [name] DGU:[id number] DOB: Nov 14, 1928 DOA: 06/09/2016  PCP: [name], MD  Admit date: 06/09/2016 Discharge date: 06/10/2016  Recommendations for Outpatient Follow-up:  1. F/u bilateral pelvic fractures, weight-bear as tolerated. 2. Home health PT 3. Incidental finding of hypoxia, secondary to COPD, started on home oxygen this admission. COPD stable.   Follow-up Information    [name], MD Follow up.   Specialty:  Family Medicine Why:  as needed Contact information: [place] [address] [phone]        Arther Abbott, MD Follow up in 2 week(s).   Specialties:  Orthopedic Surgery, Radiology Contact information: [address] [place] [address] [phone]          Discharge Diagnoses:  1. Mechanical fall with bilateral pubic rami fractures.   2. Leukocytosis.   3. Hyponatremia.   4. COPD.  5. DM type 2. Past A1c was 6.6% in June of 2017. Stable as inpatient.  Discharge Condition: improved Disposition: home with [facility] PT  Diet recommendation: diabetic diet  Filed Weights   06/09/16 2001 06/10/16 0213  Weight: 33.3 kg (73 lb 6 oz) 34 kg (75 lb)    History of present illness:  82 year-old woman presented with a history of dementia, depression, anxiety, and COPD, presented with complaints of hip pain after a mechanical fall. X-rays revealed bilateral pelvic fractures. Orthopedics recommended weight-bear as tolerated. She was admitted for further management and intractable pain.   Hospital Course:  Observed overnight, treated with supportive care and pain control. PT recommended HHPT. Hospitalization was uncomplicated. Home today with PT, Tylenol for pain and Ultram as needed. 1. Mechanical fall with bilateral pubic rami fractures. Orthopedic surgery recommended WBAT. 2. Leukocytosis. Resolving, secondary to demargination, fracture. No evidence of infection.  3. Hyponatremia. Mild.  Chloride also low suggesting dehydration. Creatinine normal. Asymptomatic. 4. COPD. Appears compensated at this time. Continue on Atrovent and albuterol as needed. Hypoxic on RA. No evidence of acute illness, CXR 11/24 no acute features. Plan start oxygen and follow-up as outpatient. 5. Hypertension. Pressures are stable. Continue Lopressor   6. DM type 2. Past A1c was 6.6% in June of 2017. Stable as inpatient. 7. Anxiety/Depression. Appears to be stable. Continue Effexor, Celexa, and Valium.  8. Mild non-pruluent cellulitis of the right wrist. Appears to be nearly resolved. Will continue antibiotic Keflex to complete the course.    Medically stable, pain improved. Awaiting physical therapy consult.   Discussed pain management with patient, rec scheduled Tylenol (limit to 4 gms/day) and PRN Tramadol. Counseled on ADR including falls, confusion, sedation.  WBAT and f/u with Dr. Aline Brochure orthopedics as an outpatient.  Consultants:  Physical therapy    Procedures:  None   Antimicrobials:  Keflex 11/28 >> 12/3     Discharge Instructions  Discharge Instructions    Diet Carb Modified    Complete by:  As directed    Discharge instructions    Complete by:  As directed    Call your physician or seek immediate medical attention for increased pain, weakness, numbness, confusion or worsening of condition.   Increase activity slowly    Complete by:  As directed        Medication List    STOP taking these medications   cefdinir 300 MG capsule Commonly known as:  OMNICEF   promethazine 12.5 MG tablet Commonly known as:  PHENERGAN     TAKE these medications   acetaminophen 325 MG tablet  Commonly known as:  TYLENOL Take 2 tablets (650 mg total) by mouth every 6 (six) hours as needed for mild pain or headache (or Fever >/= 101).   ATROVENT HFA 17 MCG/ACT inhaler Generic drug:  ipratropium 2 PUFFS FOUR TIMES DAILY.   cephALEXin 500 MG capsule Commonly known as:   KEFLEX Take 1 capsule (500 mg total) by mouth 2 (two) times daily.   citalopram 10 MG tablet Commonly known as:  CELEXA TAKE (1) TABLET BY MOUTH ONCE DAILY. What changed:  See the new instructions.   diazepam 5 MG tablet Commonly known as:  VALIUM Take 0.5 tablets twice a daily as needed. Take sparingly (Prescription must last 30 days) What changed:  how much to take  how to take this  when to take this  additional instructions   GAS RELIEF PO Take 1 tablet by mouth 3 (three) times daily after meals.   levothyroxine 25 MCG tablet Commonly known as:  SYNTHROID, LEVOTHROID TAKE 2 TABLETS ON MONDAY AND FRIDAY. & 1 TABLET ALL OTHER DAYS.   lipase/protease/amylase 12000 units Cpep capsule Commonly known as:  CREON Take 2 capsules (24,000 Units total) by mouth 3 (three) times daily with meals.   loperamide 2 MG capsule Commonly known as:  IMODIUM Take 1 capsule (2 mg total) by mouth daily before breakfast.   Melatonin 5 MG Caps Take 5 mg by mouth at bedtime.   memantine 10 MG tablet Commonly known as:  NAMENDA TAKE (1) TABLET TWICE DAILY.   metoprolol tartrate 25 MG tablet Commonly known as:  LOPRESSOR TAKE 1/2 TABLET TWICE DAILY FOR HIGH BLOOD PRESSURE.   potassium chloride 10 MEQ tablet Commonly known as:  K-DUR TAKE (1) TABLET BY MOUTH ONCE DAILY.   ROBITUSSIN NIGHTTIME COUGH DM 12.5-30 MG/10ML Liqd Generic drug:  Doxylamine-DM Take by mouth. Patient taked 1 teaspoon as needed.   sulfamethoxazole-trimethoprim 800-160 MG tablet Commonly known as:  BACTRIM DS,SEPTRA DS Take 1 tablet by mouth 2 (two) times daily.   traMADol 50 MG tablet Commonly known as:  ULTRAM Take 1 tablet (50 mg total) by mouth every 12 (twelve) hours as needed.   venlafaxine XR 75 MG 24 hr capsule Commonly known as:  EFFEXOR-XR TAKE 1 CAPSULE ONCE DAILY WITH BREAKFAST.      Allergies  Allergen Reactions  . Levaquin [Levofloxacin] Other (See Comments)    Patient states that she  became weak and extremely tired. Also, it made her real dizzy.  . Tetanus Toxoid Hives and Swelling  . Zolpidem Tartrate     Not in right state of mind  . Fosamax [Alendronate Sodium] Other (See Comments)    Severe gastritis reflux.    The results of significant diagnostics from this hospitalization (including imaging, microbiology, ancillary and laboratory) are listed below for reference.    Significant Diagnostic Studies: Dg Chest 2 View  Result Date: 06/07/2016 CLINICAL DATA:  82 year old female with recent onset cough. Hypoxia. Initial encounter. Chronic lung disease. EXAM: CHEST  2 VIEW COMPARISON:  05/27/2016 chest radiographs and earlier. FINDINGS: AP upright and lateral views of the chest. Resolution of the acute right perihilar opacity seen on. Chronic hyperinflation. Chronic bilateral upper lobe architectural distortion. Chronic surgical clips at the distal esophagus and gastroesophageal junction. Chronic enlargement of the distal esophagus on prior CTs. This might explain the rounded cavitary type appearance projecting over the distal esophagus on the lateral view today. No definite new pulmonary opacity. Calcified aortic atherosclerosis. Osteopenia. No acute osseous abnormality identified. IMPRESSION: Severe chronic  lung disease with resolved patchy right perihilar opacity since 05/27/2016 and no definite new cardiopulmonary abnormality. Calcified aortic atherosclerosis. Electronically Signed   By: Genevie Ann M.D.   On: 06/07/2016 19:49   Ct Head Wo Contrast  Result Date: 06/09/2016 CLINICAL DATA:  [place] and hit head EXAM: CT HEAD WITHOUT CONTRAST TECHNIQUE: Contiguous axial images were obtained from the base of the skull through the vertex without intravenous contrast. COMPARISON:  12/18/2015, 08/30/2014 FINDINGS: Brain: No acute territorial infarction, intracranial hemorrhage or extra-axial fluid collection is seen. There is no focal mass, mass effect or midline shift. Mild to moderate  periventricular, subcortical, and deep white matter hypodensities consistent with small vessel disease, similar in distribution compared to previous exam. Mild cortical atrophy. Ventricles are nonenlarged. Vascular: No hyperdense vessels.  Carotid artery calcifications. Skull: Mastoid air cells are clear.  No skull fracture is seen. Sinuses/Orbits: Paranasal sinuses show mild mucosal thickening. There is no acute orbital abnormality. Other: None IMPRESSION: 1. No CT evidence for acute intracranial abnormality. 2. Mild to moderate small vessel ischemic disease of the white matter. Electronically Signed   By: Donavan Foil M.D.   On: 06/09/2016 21:57   Dg Knee Complete 4 Views Left  Result Date: 06/09/2016 CLINICAL DATA:  Fall with pain EXAM: LEFT KNEE - COMPLETE 4+ VIEW COMPARISON:  None. FINDINGS: No acute displaced fracture or malalignment. Mild narrowing of the medial and lateral joint space compartments. No large effusion. Disc space calcification consistent with chondrocalcinosis. IMPRESSION: 1. No acute fracture or malalignment 2. Chondrocalcinosis Electronically Signed   By: Donavan Foil M.D.   On: 06/09/2016 22:03   Dg Hip Unilat With Pelvis 2-3 Views Left  Result Date: 06/09/2016 CLINICAL DATA:  Fall left hip to knee pain EXAM: DG HIP (WITH OR WITHOUT PELVIS) 2-3V LEFT COMPARISON:  None. FINDINGS: Calcified phleboliths in the pelvis. Postsurgical changes over the abdomen. The SI joints do not appear widened. No dislocation is evident. There is a comminuted right superior pubic ramus fracture with extension of fracture lucency to the right pubic symphysis. There is a nondisplaced but slightly overriding left inferior pubic ramus fracture. There is a minimally displaced left superior pubic ramus fracture. IMPRESSION: 1. Comminuted right superior pubic ramus fracture with fracture lucency present in the right pubic symphysis. 2. Nondisplaced but slightly overriding fracture of the left inferior pubic  ramus. Mildly displaced fracture involving the left superior pubic ramus. Electronically Signed   By: Donavan Foil M.D.   On: 06/09/2016 22:01   Dg Femur Min 2 Views Left  Result Date: 06/09/2016 CLINICAL DATA:  Fall with pain EXAM: LEFT FEMUR 2 VIEWS COMPARISON:  None. FINDINGS: Mildly displaced left superior pubic ramus fracture. Nondisplaced but slightly overriding left inferior pubic ramus fracture. Left femoral head demonstrates normal positioning. There is no left femoral fracture identified. IMPRESSION: 1. No acute displaced left femoral fracture 2. Left superior and inferior pubic rami fractures. Electronically Signed   By: Donavan Foil M.D.   On: 06/09/2016 22:04    Labs: Basic Metabolic Panel:  Recent Labs Lab 06/07/16 1952 06/09/16 2230 06/10/16 0520  NA 136 133* 134*  K 3.8 3.9 4.2  CL 98* 97* 97*  CO2 '30 28 29  '$ GLUCOSE 192* 172* 163*  BUN '17 17 15  '$ CREATININE 0.96 1.01* 0.84  CALCIUM 9.2 9.0 8.8*   CBC:  Recent Labs Lab 06/07/16 1952 06/09/16 2230 06/10/16 0817  WBC 9.8 22.3* 12.1*  NEUTROABS 6.2 17.1*  --   HGB 12.9 12.3  11.8*  HCT 40.2 38.5 36.6  MCV 95.0 95.3 94.6  PLT 268 263 229    CBG:  Recent Labs Lab 06/10/16 0238 06/10/16 0739 06/10/16 1132  GLUCAP 167* 125* 93    Principal Problem:   Pubic ramus fracture, left, closed, initial encounter ([place]) Active Problems:   Hypothyroidism   Adult failure to thrive   Depression with anxiety   Pubic ramus fracture, right, closed, initial encounter ([place])   COPD (chronic obstructive pulmonary disease) ([place])   Hypertension   Diabetes mellitus ([place])   Hyponatremia   Leukocytosis   Fall at home   Pelvic fracture [facility])   Closed bilateral fracture of pubic rami ([place])   Fall   Time coordinating discharge: 35 minutes  Signed:  Murray Hodgkins, MD [facility] 06/10/2016, 12:37 PM

## 2016-06-10 NOTE — Care Management Note (Signed)
Case Management Note  Patient Details  Name: DEBARAH MCCUMBERS MRN: [id number] Date of Birth: 1929/03/26  Expected Discharge Date:  06/10/16               Expected Discharge Plan:  [place]  In-House Referral:  NA  Discharge planning Services  CM Consult  Post Acute Care Choice:  Home Health, Durable Medical Equipment Choice offered to:  Patient  DME Arranged:  Oxygen DME Agency:  [place] Arranged:  RN, PT [facility] Agency:  [place]  Status of Service:  Completed, signed off   Additional Comments: Per home O2 assessment pt meets criteria for supplemental oxygen at DC. PT has chosen [facility] from list of DME providers. Romualdo Bolk, of [facility], aware of referral and will obtain pt info from chart and well deliver port o2 tank to room prior to DC.  Sherald Barge, RN 06/10/2016, 3:10 PM

## 2016-06-10 NOTE — Progress Notes (Signed)
Patient states she uses her Atrovent inhaler 3 times a day. Neb changed to Tid as this is more what she takes at home. Her lung sounds appear decreased but clear.

## 2016-06-12 DIAGNOSIS — Z933 Colostomy status: Secondary | ICD-10-CM | POA: Diagnosis not present

## 2016-06-12 DIAGNOSIS — M542 Cervicalgia: Secondary | ICD-10-CM | POA: Diagnosis not present

## 2016-06-12 DIAGNOSIS — S32512D Fracture of superior rim of left pubis, subsequent encounter for fracture with routine healing: Secondary | ICD-10-CM | POA: Diagnosis not present

## 2016-06-12 DIAGNOSIS — W19XXXD Unspecified fall, subsequent encounter: Secondary | ICD-10-CM | POA: Diagnosis not present

## 2016-06-12 DIAGNOSIS — F329 Major depressive disorder, single episode, unspecified: Secondary | ICD-10-CM | POA: Diagnosis not present

## 2016-06-12 DIAGNOSIS — S32511D Fracture of superior rim of right pubis, subsequent encounter for fracture with routine healing: Secondary | ICD-10-CM | POA: Diagnosis not present

## 2016-06-12 DIAGNOSIS — F0390 Unspecified dementia without behavioral disturbance: Secondary | ICD-10-CM | POA: Diagnosis not present

## 2016-06-12 DIAGNOSIS — Z8611 Personal history of tuberculosis: Secondary | ICD-10-CM | POA: Diagnosis not present

## 2016-06-12 DIAGNOSIS — M549 Dorsalgia, unspecified: Secondary | ICD-10-CM | POA: Diagnosis not present

## 2016-06-12 DIAGNOSIS — J449 Chronic obstructive pulmonary disease, unspecified: Secondary | ICD-10-CM | POA: Diagnosis not present

## 2016-06-12 DIAGNOSIS — G8929 Other chronic pain: Secondary | ICD-10-CM | POA: Diagnosis not present

## 2016-06-12 DIAGNOSIS — F411 Generalized anxiety disorder: Secondary | ICD-10-CM | POA: Diagnosis not present

## 2016-06-12 DIAGNOSIS — E119 Type 2 diabetes mellitus without complications: Secondary | ICD-10-CM | POA: Diagnosis not present

## 2016-06-12 DIAGNOSIS — I10 Essential (primary) hypertension: Secondary | ICD-10-CM | POA: Diagnosis not present

## 2016-06-13 ENCOUNTER — Telehealth: Payer: Self-pay | Admitting: Family Medicine

## 2016-06-13 NOTE — Telephone Encounter (Signed)
Patient states that tramadol is not working.

## 2016-06-13 NOTE — Telephone Encounter (Signed)
Hydrocodone 5 mg/325, #24, one half to one every 6 hours when necessary pain caution drowsiness

## 2016-06-13 NOTE — Telephone Encounter (Signed)
Nurse's-you'll need to speak with patient or family Yong Channel will need to find out is a tramadol helping enough, if not may need to use hydrocodone.

## 2016-06-13 NOTE — Telephone Encounter (Signed)
Pt fell and broke her pelvis on Sunday. Pt is needing a prescription for pain medication. Pt was prescribed tramadol. Please advise.    [facility]

## 2016-06-14 ENCOUNTER — Other Ambulatory Visit: Payer: Self-pay | Admitting: *Deleted

## 2016-06-14 DIAGNOSIS — F329 Major depressive disorder, single episode, unspecified: Secondary | ICD-10-CM | POA: Diagnosis not present

## 2016-06-14 DIAGNOSIS — I10 Essential (primary) hypertension: Secondary | ICD-10-CM | POA: Diagnosis not present

## 2016-06-14 DIAGNOSIS — S32512D Fracture of superior rim of left pubis, subsequent encounter for fracture with routine healing: Secondary | ICD-10-CM | POA: Diagnosis not present

## 2016-06-14 DIAGNOSIS — E119 Type 2 diabetes mellitus without complications: Secondary | ICD-10-CM | POA: Diagnosis not present

## 2016-06-14 DIAGNOSIS — J449 Chronic obstructive pulmonary disease, unspecified: Secondary | ICD-10-CM | POA: Diagnosis not present

## 2016-06-14 DIAGNOSIS — S32511D Fracture of superior rim of right pubis, subsequent encounter for fracture with routine healing: Secondary | ICD-10-CM | POA: Diagnosis not present

## 2016-06-14 MED ORDER — HYDROCODONE-ACETAMINOPHEN 5-325 MG PO TABS: 1.0000 | ORAL_TABLET | Freq: Four times a day (QID) | ORAL | 0 refills | Status: DC | PRN

## 2016-06-14 NOTE — Progress Notes (Unsigned)
hyr

## 2016-06-14 NOTE — Telephone Encounter (Signed)
Script ready for pickup. Pt notified.

## 2016-06-17 ENCOUNTER — Telehealth: Payer: Self-pay | Admitting: Family Medicine

## 2016-06-17 DIAGNOSIS — E119 Type 2 diabetes mellitus without complications: Secondary | ICD-10-CM | POA: Diagnosis not present

## 2016-06-17 DIAGNOSIS — S32512D Fracture of superior rim of left pubis, subsequent encounter for fracture with routine healing: Secondary | ICD-10-CM | POA: Diagnosis not present

## 2016-06-17 DIAGNOSIS — S32511D Fracture of superior rim of right pubis, subsequent encounter for fracture with routine healing: Secondary | ICD-10-CM | POA: Diagnosis not present

## 2016-06-17 DIAGNOSIS — J449 Chronic obstructive pulmonary disease, unspecified: Secondary | ICD-10-CM | POA: Diagnosis not present

## 2016-06-17 DIAGNOSIS — F329 Major depressive disorder, single episode, unspecified: Secondary | ICD-10-CM | POA: Diagnosis not present

## 2016-06-17 DIAGNOSIS — I10 Essential (primary) hypertension: Secondary | ICD-10-CM | POA: Diagnosis not present

## 2016-06-17 NOTE — Telephone Encounter (Signed)
Husband BA calling for patient stating she needs stronger pain pill she had fallen. He states she barely can walk in so much pain. [place]

## 2016-06-17 NOTE — Telephone Encounter (Signed)
ERROR

## 2016-06-17 NOTE — Telephone Encounter (Signed)
Advised nurse aide that a prescription for hydrocodone was picked up on Friday by someone named [name]. Nurse aide stated that she would contact [name] to see if she still has the prescription. Advised nurse aide that Dr. Nicki Reaper states that patient needs to go to ER if pain is very bad. Nurse aide verbalized understanding.

## 2016-06-18 DIAGNOSIS — J449 Chronic obstructive pulmonary disease, unspecified: Secondary | ICD-10-CM | POA: Diagnosis not present

## 2016-06-18 DIAGNOSIS — F329 Major depressive disorder, single episode, unspecified: Secondary | ICD-10-CM | POA: Diagnosis not present

## 2016-06-18 DIAGNOSIS — S32511D Fracture of superior rim of right pubis, subsequent encounter for fracture with routine healing: Secondary | ICD-10-CM | POA: Diagnosis not present

## 2016-06-18 DIAGNOSIS — I10 Essential (primary) hypertension: Secondary | ICD-10-CM | POA: Diagnosis not present

## 2016-06-18 DIAGNOSIS — S32512D Fracture of superior rim of left pubis, subsequent encounter for fracture with routine healing: Secondary | ICD-10-CM | POA: Diagnosis not present

## 2016-06-18 DIAGNOSIS — E119 Type 2 diabetes mellitus without complications: Secondary | ICD-10-CM | POA: Diagnosis not present

## 2016-06-19 ENCOUNTER — Telehealth: Payer: Self-pay | Admitting: Family Medicine

## 2016-06-19 NOTE — Telephone Encounter (Signed)
We need to make sure that home health can go out and see the patient and examine this area-please try to see if home health could go this week- it is unknown if it is infection or something else going on. As for the pain discontinue tramadol. I would recommend Percocet 5 mg/325, #20, one half to one every 6 hours when necessary severe pain caution drowsiness stop hydrocodone. Ultimately the patient must be seen. Stating that she cannot come to be seen in ER or office is inadequate if she continues to have pain she will need to go to be evaluated.

## 2016-06-19 NOTE — Telephone Encounter (Signed)
Tried to call no answer received busy signal 06/19/16

## 2016-06-19 NOTE — Telephone Encounter (Signed)
Pain is still at a 10- taking hydrocodone at night and tramadol during the day and it is not helping pain is a 10 +  The left side of her bottom is swollen and trobbing and knees and legs killing her and she can barely walk- unable to come to office due to severity of pain. Also don't want to continue potassium because it makes her urine dark. Patient doesn't want to go to hospital or [facility] just needs a stronger pain med for some kind of relief

## 2016-06-19 NOTE — Telephone Encounter (Signed)
Patient states needs something stronger ,she.states pain is at a 10. Her left buttock is swollen cant sleep.She doesn't want to continue her potassium pill-k-dur 10 meq she is currently on she wants a different pill states makes her urine dark yellow.patient states cant come in because she cant walk in too much pain.[facility]

## 2016-06-20 ENCOUNTER — Other Ambulatory Visit: Payer: Self-pay

## 2016-06-20 DIAGNOSIS — I10 Essential (primary) hypertension: Secondary | ICD-10-CM | POA: Diagnosis not present

## 2016-06-20 DIAGNOSIS — F329 Major depressive disorder, single episode, unspecified: Secondary | ICD-10-CM | POA: Diagnosis not present

## 2016-06-20 DIAGNOSIS — S32511D Fracture of superior rim of right pubis, subsequent encounter for fracture with routine healing: Secondary | ICD-10-CM | POA: Diagnosis not present

## 2016-06-20 DIAGNOSIS — S32512D Fracture of superior rim of left pubis, subsequent encounter for fracture with routine healing: Secondary | ICD-10-CM | POA: Diagnosis not present

## 2016-06-20 DIAGNOSIS — E119 Type 2 diabetes mellitus without complications: Secondary | ICD-10-CM | POA: Diagnosis not present

## 2016-06-20 DIAGNOSIS — J449 Chronic obstructive pulmonary disease, unspecified: Secondary | ICD-10-CM | POA: Diagnosis not present

## 2016-06-21 DIAGNOSIS — I10 Essential (primary) hypertension: Secondary | ICD-10-CM | POA: Diagnosis not present

## 2016-06-21 DIAGNOSIS — J449 Chronic obstructive pulmonary disease, unspecified: Secondary | ICD-10-CM | POA: Diagnosis not present

## 2016-06-21 DIAGNOSIS — E119 Type 2 diabetes mellitus without complications: Secondary | ICD-10-CM | POA: Diagnosis not present

## 2016-06-21 DIAGNOSIS — S32512D Fracture of superior rim of left pubis, subsequent encounter for fracture with routine healing: Secondary | ICD-10-CM | POA: Diagnosis not present

## 2016-06-21 DIAGNOSIS — S32511D Fracture of superior rim of right pubis, subsequent encounter for fracture with routine healing: Secondary | ICD-10-CM | POA: Diagnosis not present

## 2016-06-21 DIAGNOSIS — F329 Major depressive disorder, single episode, unspecified: Secondary | ICD-10-CM | POA: Diagnosis not present

## 2016-06-24 ENCOUNTER — Telehealth: Payer: Self-pay | Admitting: Family Medicine

## 2016-06-24 ENCOUNTER — Other Ambulatory Visit: Payer: Self-pay | Admitting: *Deleted

## 2016-06-24 DIAGNOSIS — S32512D Fracture of superior rim of left pubis, subsequent encounter for fracture with routine healing: Secondary | ICD-10-CM | POA: Diagnosis not present

## 2016-06-24 DIAGNOSIS — E119 Type 2 diabetes mellitus without complications: Secondary | ICD-10-CM | POA: Diagnosis not present

## 2016-06-24 DIAGNOSIS — F329 Major depressive disorder, single episode, unspecified: Secondary | ICD-10-CM | POA: Diagnosis not present

## 2016-06-24 DIAGNOSIS — I10 Essential (primary) hypertension: Secondary | ICD-10-CM | POA: Diagnosis not present

## 2016-06-24 DIAGNOSIS — S32511D Fracture of superior rim of right pubis, subsequent encounter for fracture with routine healing: Secondary | ICD-10-CM | POA: Diagnosis not present

## 2016-06-24 DIAGNOSIS — J449 Chronic obstructive pulmonary disease, unspecified: Secondary | ICD-10-CM | POA: Diagnosis not present

## 2016-06-24 MED ORDER — OXYCODONE-ACETAMINOPHEN 5-325 MG PO TABS: ORAL_TABLET | ORAL | 0 refills | Status: DC

## 2016-06-24 NOTE — Telephone Encounter (Signed)
Called pt to follow up with her. Still having pain. Pt did not get script for percocet last week. Script printed today. [facility] to pickup and deliver. Called [facility] and they will go out today and check buttock where the pain is coming from.

## 2016-06-24 NOTE — Telephone Encounter (Signed)
If she develops a fever to follow-up immediately otherwise keep follow-up visit in early January-she may be called on Tuesday

## 2016-06-24 NOTE — Telephone Encounter (Signed)
Clarise Cruz with [facility] was told to call back today and let us know if there is any skin issues with patients buttocks because she called in the other day complaining of this.  She said there are no open sores as far as she can tell.  She did fracture her pelvis in 3 different places, and the area she is pointing to seems to be at her pelvis.    Also, she wanted to report that patient does have rhonchi in her left upper and lower, but she doesn't have a fever.

## 2016-06-25 ENCOUNTER — Telehealth: Payer: Self-pay | Admitting: Family Medicine

## 2016-06-25 DIAGNOSIS — I10 Essential (primary) hypertension: Secondary | ICD-10-CM | POA: Diagnosis not present

## 2016-06-25 DIAGNOSIS — E119 Type 2 diabetes mellitus without complications: Secondary | ICD-10-CM | POA: Diagnosis not present

## 2016-06-25 DIAGNOSIS — J449 Chronic obstructive pulmonary disease, unspecified: Secondary | ICD-10-CM | POA: Diagnosis not present

## 2016-06-25 DIAGNOSIS — S32512D Fracture of superior rim of left pubis, subsequent encounter for fracture with routine healing: Secondary | ICD-10-CM | POA: Diagnosis not present

## 2016-06-25 DIAGNOSIS — S32511D Fracture of superior rim of right pubis, subsequent encounter for fracture with routine healing: Secondary | ICD-10-CM | POA: Diagnosis not present

## 2016-06-25 DIAGNOSIS — F329 Major depressive disorder, single episode, unspecified: Secondary | ICD-10-CM | POA: Diagnosis not present

## 2016-06-25 NOTE — Telephone Encounter (Signed)
Notified [name] if she develops a fever to follow-up immediately otherwise keep follow-up visit in early January. [name] verbalized understanding.

## 2016-06-25 NOTE — Telephone Encounter (Signed)
Please complete Statement of Medical Necessity located in forms folder.

## 2016-06-27 DIAGNOSIS — J449 Chronic obstructive pulmonary disease, unspecified: Secondary | ICD-10-CM | POA: Diagnosis not present

## 2016-06-27 DIAGNOSIS — S32511D Fracture of superior rim of right pubis, subsequent encounter for fracture with routine healing: Secondary | ICD-10-CM | POA: Diagnosis not present

## 2016-06-27 DIAGNOSIS — F329 Major depressive disorder, single episode, unspecified: Secondary | ICD-10-CM | POA: Diagnosis not present

## 2016-06-27 DIAGNOSIS — S32512D Fracture of superior rim of left pubis, subsequent encounter for fracture with routine healing: Secondary | ICD-10-CM | POA: Diagnosis not present

## 2016-06-27 DIAGNOSIS — I10 Essential (primary) hypertension: Secondary | ICD-10-CM | POA: Diagnosis not present

## 2016-06-27 DIAGNOSIS — E119 Type 2 diabetes mellitus without complications: Secondary | ICD-10-CM | POA: Diagnosis not present

## 2016-07-01 ENCOUNTER — Telehealth (INDEPENDENT_AMBULATORY_CARE_PROVIDER_SITE_OTHER): Payer: Self-pay | Admitting: *Deleted

## 2016-07-01 ENCOUNTER — Other Ambulatory Visit: Payer: Self-pay | Admitting: Family Medicine

## 2016-07-01 DIAGNOSIS — I10 Essential (primary) hypertension: Secondary | ICD-10-CM | POA: Diagnosis not present

## 2016-07-01 DIAGNOSIS — S32512D Fracture of superior rim of left pubis, subsequent encounter for fracture with routine healing: Secondary | ICD-10-CM | POA: Diagnosis not present

## 2016-07-01 DIAGNOSIS — F329 Major depressive disorder, single episode, unspecified: Secondary | ICD-10-CM | POA: Diagnosis not present

## 2016-07-01 DIAGNOSIS — J449 Chronic obstructive pulmonary disease, unspecified: Secondary | ICD-10-CM | POA: Diagnosis not present

## 2016-07-01 DIAGNOSIS — S32511D Fracture of superior rim of right pubis, subsequent encounter for fracture with routine healing: Secondary | ICD-10-CM | POA: Diagnosis not present

## 2016-07-01 DIAGNOSIS — E119 Type 2 diabetes mellitus without complications: Secondary | ICD-10-CM | POA: Diagnosis not present

## 2016-07-01 NOTE — Telephone Encounter (Signed)
Samples are ready for the patient . [facility] to pick up.

## 2016-07-01 NOTE — Telephone Encounter (Signed)
[name] called, they wants to know if we have samples of Creon  Ph# [phone]

## 2016-07-03 DIAGNOSIS — I10 Essential (primary) hypertension: Secondary | ICD-10-CM | POA: Diagnosis not present

## 2016-07-03 DIAGNOSIS — F329 Major depressive disorder, single episode, unspecified: Secondary | ICD-10-CM | POA: Diagnosis not present

## 2016-07-03 DIAGNOSIS — S32512D Fracture of superior rim of left pubis, subsequent encounter for fracture with routine healing: Secondary | ICD-10-CM | POA: Diagnosis not present

## 2016-07-03 DIAGNOSIS — S32511D Fracture of superior rim of right pubis, subsequent encounter for fracture with routine healing: Secondary | ICD-10-CM | POA: Diagnosis not present

## 2016-07-03 DIAGNOSIS — E119 Type 2 diabetes mellitus without complications: Secondary | ICD-10-CM | POA: Diagnosis not present

## 2016-07-03 DIAGNOSIS — J449 Chronic obstructive pulmonary disease, unspecified: Secondary | ICD-10-CM | POA: Diagnosis not present

## 2016-07-03 NOTE — Telephone Encounter (Signed)
Further documentation was done on a visit from November please send this with the letter from [facility] to the [facility] people

## 2016-07-04 NOTE — Telephone Encounter (Signed)
Faxed documentation 07/03/16 to [facility]

## 2016-07-05 ENCOUNTER — Telehealth: Payer: Self-pay | Admitting: Family Medicine

## 2016-07-05 DIAGNOSIS — S32511D Fracture of superior rim of right pubis, subsequent encounter for fracture with routine healing: Secondary | ICD-10-CM | POA: Diagnosis not present

## 2016-07-05 DIAGNOSIS — E119 Type 2 diabetes mellitus without complications: Secondary | ICD-10-CM | POA: Diagnosis not present

## 2016-07-05 DIAGNOSIS — I10 Essential (primary) hypertension: Secondary | ICD-10-CM | POA: Diagnosis not present

## 2016-07-05 DIAGNOSIS — J449 Chronic obstructive pulmonary disease, unspecified: Secondary | ICD-10-CM | POA: Diagnosis not present

## 2016-07-05 DIAGNOSIS — S32512D Fracture of superior rim of left pubis, subsequent encounter for fracture with routine healing: Secondary | ICD-10-CM | POA: Diagnosis not present

## 2016-07-05 DIAGNOSIS — F329 Major depressive disorder, single episode, unspecified: Secondary | ICD-10-CM | POA: Diagnosis not present

## 2016-07-05 NOTE — Telephone Encounter (Signed)
Patients physical therapy orders end today.  [name] is requesting an extension for 2 times weekly for 3 additional weeks.

## 2016-07-05 NOTE — Telephone Encounter (Signed)
ok 

## 2016-07-05 NOTE — Telephone Encounter (Signed)
Verbal given to Marzetta Board, PT at [facility] understanding.

## 2016-07-09 ENCOUNTER — Telehealth: Payer: Self-pay | Admitting: Family Medicine

## 2016-07-09 ENCOUNTER — Other Ambulatory Visit: Payer: Self-pay | Admitting: *Deleted

## 2016-07-09 DIAGNOSIS — S32512D Fracture of superior rim of left pubis, subsequent encounter for fracture with routine healing: Secondary | ICD-10-CM | POA: Diagnosis not present

## 2016-07-09 DIAGNOSIS — J449 Chronic obstructive pulmonary disease, unspecified: Secondary | ICD-10-CM | POA: Diagnosis not present

## 2016-07-09 DIAGNOSIS — E119 Type 2 diabetes mellitus without complications: Secondary | ICD-10-CM | POA: Diagnosis not present

## 2016-07-09 DIAGNOSIS — S32511D Fracture of superior rim of right pubis, subsequent encounter for fracture with routine healing: Secondary | ICD-10-CM | POA: Diagnosis not present

## 2016-07-09 DIAGNOSIS — F329 Major depressive disorder, single episode, unspecified: Secondary | ICD-10-CM | POA: Diagnosis not present

## 2016-07-09 DIAGNOSIS — I10 Essential (primary) hypertension: Secondary | ICD-10-CM | POA: Diagnosis not present

## 2016-07-09 MED ORDER — CEFPROZIL 250 MG PO TABS: 250.0000 mg | ORAL_TABLET | Freq: Two times a day (BID) | ORAL | 0 refills | Status: DC

## 2016-07-09 MED ORDER — CIPROFLOXACIN HCL 250 MG PO TABS: 250.0000 mg | ORAL_TABLET | Freq: Two times a day (BID) | ORAL | 0 refills | Status: DC

## 2016-07-09 NOTE — Telephone Encounter (Signed)
Pt's cna called stating that the pt is having burning during urination and is unable to walk due to a cracked pelvis. Pt is requesting antibiotics to be called in. Please advise.    [facility]

## 2016-07-09 NOTE — Telephone Encounter (Signed)
Med sent to pharm. Pt notified.  

## 2016-07-09 NOTE — Progress Notes (Unsigned)
cipro 250

## 2016-07-09 NOTE — Telephone Encounter (Signed)
Cefzil 250 mg tablet 1 twice a day for 5 days

## 2016-07-09 NOTE — Telephone Encounter (Signed)
Burning with urination and urinary frequency for 3 days. No fever.

## 2016-07-10 ENCOUNTER — Telehealth: Payer: Self-pay | Admitting: Family Medicine

## 2016-07-10 DIAGNOSIS — I10 Essential (primary) hypertension: Secondary | ICD-10-CM | POA: Diagnosis not present

## 2016-07-10 DIAGNOSIS — S32512D Fracture of superior rim of left pubis, subsequent encounter for fracture with routine healing: Secondary | ICD-10-CM | POA: Diagnosis not present

## 2016-07-10 DIAGNOSIS — J449 Chronic obstructive pulmonary disease, unspecified: Secondary | ICD-10-CM | POA: Diagnosis not present

## 2016-07-10 DIAGNOSIS — F329 Major depressive disorder, single episode, unspecified: Secondary | ICD-10-CM | POA: Diagnosis not present

## 2016-07-10 DIAGNOSIS — E119 Type 2 diabetes mellitus without complications: Secondary | ICD-10-CM | POA: Diagnosis not present

## 2016-07-10 DIAGNOSIS — S32511D Fracture of superior rim of right pubis, subsequent encounter for fracture with routine healing: Secondary | ICD-10-CM | POA: Diagnosis not present

## 2016-07-10 NOTE — Telephone Encounter (Signed)
Patient is no longer using her oxygen tanks.  [facility] advised her CNA that we would have to contact them in order for them to get the oxygen tanks.  Please advise.

## 2016-07-10 NOTE — Telephone Encounter (Signed)
I think it would be wise and prudent in order to have the patient make sure that her oxygen level is good. When she comes on February 12 to check her oxygen level here. Then based on that we can certainly give an order to the home health to come pickup her oxygen tanks.

## 2016-07-11 NOTE — Telephone Encounter (Signed)
Discussed with pt's CNA [name].

## 2016-07-12 DIAGNOSIS — J449 Chronic obstructive pulmonary disease, unspecified: Secondary | ICD-10-CM | POA: Diagnosis not present

## 2016-07-12 DIAGNOSIS — E119 Type 2 diabetes mellitus without complications: Secondary | ICD-10-CM | POA: Diagnosis not present

## 2016-07-12 DIAGNOSIS — S32512D Fracture of superior rim of left pubis, subsequent encounter for fracture with routine healing: Secondary | ICD-10-CM | POA: Diagnosis not present

## 2016-07-12 DIAGNOSIS — S32511D Fracture of superior rim of right pubis, subsequent encounter for fracture with routine healing: Secondary | ICD-10-CM | POA: Diagnosis not present

## 2016-07-12 DIAGNOSIS — I10 Essential (primary) hypertension: Secondary | ICD-10-CM | POA: Diagnosis not present

## 2016-07-12 DIAGNOSIS — F329 Major depressive disorder, single episode, unspecified: Secondary | ICD-10-CM | POA: Diagnosis not present

## 2016-07-17 DIAGNOSIS — I10 Essential (primary) hypertension: Secondary | ICD-10-CM | POA: Diagnosis not present

## 2016-07-17 DIAGNOSIS — F329 Major depressive disorder, single episode, unspecified: Secondary | ICD-10-CM | POA: Diagnosis not present

## 2016-07-17 DIAGNOSIS — S32512D Fracture of superior rim of left pubis, subsequent encounter for fracture with routine healing: Secondary | ICD-10-CM | POA: Diagnosis not present

## 2016-07-17 DIAGNOSIS — S32511D Fracture of superior rim of right pubis, subsequent encounter for fracture with routine healing: Secondary | ICD-10-CM | POA: Diagnosis not present

## 2016-07-17 DIAGNOSIS — J449 Chronic obstructive pulmonary disease, unspecified: Secondary | ICD-10-CM | POA: Diagnosis not present

## 2016-07-17 DIAGNOSIS — E119 Type 2 diabetes mellitus without complications: Secondary | ICD-10-CM | POA: Diagnosis not present

## 2016-07-18 DIAGNOSIS — E119 Type 2 diabetes mellitus without complications: Secondary | ICD-10-CM | POA: Diagnosis not present

## 2016-07-18 DIAGNOSIS — J449 Chronic obstructive pulmonary disease, unspecified: Secondary | ICD-10-CM | POA: Diagnosis not present

## 2016-07-18 DIAGNOSIS — S32512D Fracture of superior rim of left pubis, subsequent encounter for fracture with routine healing: Secondary | ICD-10-CM | POA: Diagnosis not present

## 2016-07-18 DIAGNOSIS — I10 Essential (primary) hypertension: Secondary | ICD-10-CM | POA: Diagnosis not present

## 2016-07-18 DIAGNOSIS — F329 Major depressive disorder, single episode, unspecified: Secondary | ICD-10-CM | POA: Diagnosis not present

## 2016-07-18 DIAGNOSIS — S32511D Fracture of superior rim of right pubis, subsequent encounter for fracture with routine healing: Secondary | ICD-10-CM | POA: Diagnosis not present

## 2016-07-22 DIAGNOSIS — E119 Type 2 diabetes mellitus without complications: Secondary | ICD-10-CM | POA: Diagnosis not present

## 2016-07-22 DIAGNOSIS — S32511D Fracture of superior rim of right pubis, subsequent encounter for fracture with routine healing: Secondary | ICD-10-CM | POA: Diagnosis not present

## 2016-07-22 DIAGNOSIS — S32512D Fracture of superior rim of left pubis, subsequent encounter for fracture with routine healing: Secondary | ICD-10-CM | POA: Diagnosis not present

## 2016-07-22 DIAGNOSIS — F329 Major depressive disorder, single episode, unspecified: Secondary | ICD-10-CM | POA: Diagnosis not present

## 2016-07-22 DIAGNOSIS — I10 Essential (primary) hypertension: Secondary | ICD-10-CM | POA: Diagnosis not present

## 2016-07-22 DIAGNOSIS — J449 Chronic obstructive pulmonary disease, unspecified: Secondary | ICD-10-CM | POA: Diagnosis not present

## 2016-07-25 DIAGNOSIS — S32512D Fracture of superior rim of left pubis, subsequent encounter for fracture with routine healing: Secondary | ICD-10-CM | POA: Diagnosis not present

## 2016-07-25 DIAGNOSIS — F329 Major depressive disorder, single episode, unspecified: Secondary | ICD-10-CM | POA: Diagnosis not present

## 2016-07-25 DIAGNOSIS — S32511D Fracture of superior rim of right pubis, subsequent encounter for fracture with routine healing: Secondary | ICD-10-CM | POA: Diagnosis not present

## 2016-07-25 DIAGNOSIS — I10 Essential (primary) hypertension: Secondary | ICD-10-CM | POA: Diagnosis not present

## 2016-07-25 DIAGNOSIS — J449 Chronic obstructive pulmonary disease, unspecified: Secondary | ICD-10-CM | POA: Diagnosis not present

## 2016-07-25 DIAGNOSIS — E119 Type 2 diabetes mellitus without complications: Secondary | ICD-10-CM | POA: Diagnosis not present

## 2016-07-26 ENCOUNTER — Ambulatory Visit: Payer: Medicare Other | Admitting: Family Medicine

## 2016-07-26 DIAGNOSIS — S32512D Fracture of superior rim of left pubis, subsequent encounter for fracture with routine healing: Secondary | ICD-10-CM | POA: Diagnosis not present

## 2016-07-26 DIAGNOSIS — J449 Chronic obstructive pulmonary disease, unspecified: Secondary | ICD-10-CM | POA: Diagnosis not present

## 2016-07-26 DIAGNOSIS — I10 Essential (primary) hypertension: Secondary | ICD-10-CM | POA: Diagnosis not present

## 2016-07-26 DIAGNOSIS — S32511D Fracture of superior rim of right pubis, subsequent encounter for fracture with routine healing: Secondary | ICD-10-CM | POA: Diagnosis not present

## 2016-07-26 DIAGNOSIS — F329 Major depressive disorder, single episode, unspecified: Secondary | ICD-10-CM | POA: Diagnosis not present

## 2016-07-26 DIAGNOSIS — E119 Type 2 diabetes mellitus without complications: Secondary | ICD-10-CM | POA: Diagnosis not present

## 2016-07-30 ENCOUNTER — Other Ambulatory Visit: Payer: Self-pay | Admitting: Family Medicine

## 2016-08-01 ENCOUNTER — Ambulatory Visit: Payer: Medicare Other | Admitting: Family Medicine

## 2016-08-06 DIAGNOSIS — I10 Essential (primary) hypertension: Secondary | ICD-10-CM | POA: Diagnosis not present

## 2016-08-06 DIAGNOSIS — S32511D Fracture of superior rim of right pubis, subsequent encounter for fracture with routine healing: Secondary | ICD-10-CM | POA: Diagnosis not present

## 2016-08-06 DIAGNOSIS — J449 Chronic obstructive pulmonary disease, unspecified: Secondary | ICD-10-CM | POA: Diagnosis not present

## 2016-08-06 DIAGNOSIS — E119 Type 2 diabetes mellitus without complications: Secondary | ICD-10-CM | POA: Diagnosis not present

## 2016-08-06 DIAGNOSIS — S32512D Fracture of superior rim of left pubis, subsequent encounter for fracture with routine healing: Secondary | ICD-10-CM | POA: Diagnosis not present

## 2016-08-06 DIAGNOSIS — F329 Major depressive disorder, single episode, unspecified: Secondary | ICD-10-CM | POA: Diagnosis not present

## 2016-08-14 ENCOUNTER — Encounter: Payer: Self-pay | Admitting: Family Medicine

## 2016-08-14 ENCOUNTER — Ambulatory Visit (INDEPENDENT_AMBULATORY_CARE_PROVIDER_SITE_OTHER): Payer: Medicare Other | Admitting: Family Medicine

## 2016-08-14 VITALS — BP 102/68 | Wt 74.2 lb

## 2016-08-14 DIAGNOSIS — E039 Hypothyroidism, unspecified: Secondary | ICD-10-CM | POA: Diagnosis not present

## 2016-08-14 DIAGNOSIS — R64 Cachexia: Secondary | ICD-10-CM | POA: Diagnosis not present

## 2016-08-14 DIAGNOSIS — I10 Essential (primary) hypertension: Secondary | ICD-10-CM

## 2016-08-14 DIAGNOSIS — J449 Chronic obstructive pulmonary disease, unspecified: Secondary | ICD-10-CM

## 2016-08-14 DIAGNOSIS — E119 Type 2 diabetes mellitus without complications: Secondary | ICD-10-CM

## 2016-08-14 DIAGNOSIS — R7303 Prediabetes: Secondary | ICD-10-CM | POA: Diagnosis not present

## 2016-08-14 LAB — POCT GLYCOSYLATED HEMOGLOBIN (HGB A1C): Hemoglobin A1C: 6.1

## 2016-08-14 NOTE — Progress Notes (Signed)
   Subjective:    Patient ID: [name], female    DOB: 1929-02-14, 83 y.o.   MRN: [id number]  HPI Patient in office today for 3 mo f/u of sinusitis.  Patient states she has been feeling good. Patient relates her COPD is stable she uses her medication as directed Her moods are stable she takes her Celexa on a regular basis Her dementia yeah stable per caretaker takes her medicine without difficulty Hypothyroidism previous TSH reviewed takes her medicine on a regular basis History of tachycardia low-dose metoprolol for blood pressure as well as heart rate takes it without difficulty Antidepressant takes it without difficulty Patient has cachexia has been suffering with losing weight  Review of Systems Patient denies vomiting denies sweats chills fevers cough congestion. States she feels pretty good.    Objective:   Physical Exam This patient has a very low weight Very minimal muscle mass Low amount of fat Lungs are clear no crackles heart regular pulse normal extremities no edema skin warm dry   25 minutes was spent with the patient. Greater than half the time was spent in discussion and answering questions and counseling regarding the issues that the patient came in for today. Face-to-face evaluation was done today this patient does benefit from home health as well as therapists in the house and aid in the house no additional adjustments or changes necessary currently    Assessment & Plan:  Prediabetes glucose looks great no need to do any further checks on this  Blood pressure-watch diet continue low-dose metoprolol to keep heart rate running fast  COPD-stable Patient to follow-up in approximately 3-4 months Mild dementia yeah [place] doing well continue this  Hypothyroidism continue levothyroxine No additional lab work necessary currently Cachexia continue to lose weight at high risk of demise and secondary illnesses infection. Patient states that she is going do the best  he can at eating too dry to gain some weight

## 2016-08-22 ENCOUNTER — Emergency Department (HOSPITAL_COMMUNITY): Payer: Medicare Other

## 2016-08-22 ENCOUNTER — Telehealth: Payer: Self-pay | Admitting: Family Medicine

## 2016-08-22 ENCOUNTER — Encounter (HOSPITAL_COMMUNITY): Payer: Self-pay | Admitting: Emergency Medicine

## 2016-08-22 ENCOUNTER — Emergency Department (HOSPITAL_COMMUNITY)
Admission: EM | Admit: 2016-08-22 | Discharge: 2016-08-22 | Disposition: A | Discharge status: Home / Self Care | Payer: Medicare Other | Attending: Emergency Medicine | Admitting: Emergency Medicine

## 2016-08-22 DIAGNOSIS — Z79899 Other long term (current) drug therapy: Secondary | ICD-10-CM | POA: Diagnosis not present

## 2016-08-22 DIAGNOSIS — R05 Cough: Secondary | ICD-10-CM | POA: Insufficient documentation

## 2016-08-22 DIAGNOSIS — E039 Hypothyroidism, unspecified: Secondary | ICD-10-CM | POA: Insufficient documentation

## 2016-08-22 DIAGNOSIS — I10 Essential (primary) hypertension: Secondary | ICD-10-CM | POA: Insufficient documentation

## 2016-08-22 DIAGNOSIS — R059 Cough, unspecified: Secondary | ICD-10-CM

## 2016-08-22 DIAGNOSIS — E119 Type 2 diabetes mellitus without complications: Secondary | ICD-10-CM | POA: Insufficient documentation

## 2016-08-22 DIAGNOSIS — J989 Respiratory disorder, unspecified: Secondary | ICD-10-CM | POA: Diagnosis not present

## 2016-08-22 DIAGNOSIS — R0602 Shortness of breath: Secondary | ICD-10-CM | POA: Diagnosis not present

## 2016-08-22 DIAGNOSIS — J449 Chronic obstructive pulmonary disease, unspecified: Secondary | ICD-10-CM | POA: Diagnosis not present

## 2016-08-22 DIAGNOSIS — N390 Urinary tract infection, site not specified: Secondary | ICD-10-CM | POA: Diagnosis not present

## 2016-08-22 DIAGNOSIS — R918 Other nonspecific abnormal finding of lung field: Secondary | ICD-10-CM | POA: Diagnosis not present

## 2016-08-22 LAB — CBC WITH DIFFERENTIAL/PLATELET
BASOS ABS: 0.1 10*3/uL (ref 0.0–0.1)
BASOS PCT: 1 %
Eosinophils Absolute: 0.3 10*3/uL (ref 0.0–0.7)
Eosinophils Relative: 2 %
HEMATOCRIT: 41.8 % (ref 36.0–46.0)
HEMOGLOBIN: 13.4 g/dL (ref 12.0–15.0)
LYMPHS PCT: 32 %
Lymphs Abs: 3.6 10*3/uL (ref 0.7–4.0)
MCH: 31.4 pg (ref 26.0–34.0)
MCHC: 32.1 g/dL (ref 30.0–36.0)
MCV: 97.9 fL (ref 78.0–100.0)
MONOS PCT: 11 %
Monocytes Absolute: 1.3 10*3/uL — ABNORMAL HIGH (ref 0.1–1.0)
NEUTROS ABS: 6.1 10*3/uL (ref 1.7–7.7)
NEUTROS PCT: 54 %
Platelets: 314 10*3/uL (ref 150–400)
RBC: 4.27 MIL/uL (ref 3.87–5.11)
RDW: 15.6 % — ABNORMAL HIGH (ref 11.5–15.5)
WBC: 11.3 10*3/uL — ABNORMAL HIGH (ref 4.0–10.5)

## 2016-08-22 LAB — COMPREHENSIVE METABOLIC PANEL
ALBUMIN: 3.5 g/dL (ref 3.5–5.0)
ALK PHOS: 130 U/L — AB (ref 38–126)
ALT: 11 U/L — ABNORMAL LOW (ref 14–54)
AST: 18 U/L (ref 15–41)
Anion gap: 5 (ref 5–15)
BILIRUBIN TOTAL: 0.6 mg/dL (ref 0.3–1.2)
BUN: 21 mg/dL — AB (ref 6–20)
CALCIUM: 9.1 mg/dL (ref 8.9–10.3)
CO2: 30 mmol/L (ref 22–32)
CREATININE: 0.99 mg/dL (ref 0.44–1.00)
Chloride: 96 mmol/L — ABNORMAL LOW (ref 101–111)
GFR calc Af Amer: 59 mL/min — ABNORMAL LOW (ref >60)
GFR, EST NON AFRICAN AMERICAN: 51 mL/min — AB (ref >60)
GLUCOSE: 140 mg/dL — AB (ref 65–99)
Potassium: 5 mmol/L (ref 3.5–5.1)
Sodium: 131 mmol/L — ABNORMAL LOW (ref 135–145)
TOTAL PROTEIN: 7.4 g/dL (ref 6.5–8.1)

## 2016-08-22 LAB — URINALYSIS, ROUTINE W REFLEX MICROSCOPIC
Bacteria, UA: NONE SEEN
Bilirubin Urine: NEGATIVE
GLUCOSE, UA: NEGATIVE mg/dL
Hgb urine dipstick: NEGATIVE
Ketones, ur: NEGATIVE mg/dL
Leukocytes, UA: NEGATIVE
Nitrite: POSITIVE — AB
PH: 5 (ref 5.0–8.0)
PROTEIN: NEGATIVE mg/dL
SPECIFIC GRAVITY, URINE: 1.01 (ref 1.005–1.030)

## 2016-08-22 MED ORDER — IOPAMIDOL (ISOVUE-300) INJECTION 61%
75.0000 mL | Freq: Once | INTRAVENOUS | Status: AC | PRN
  Administered 2016-08-22: 75 mL via INTRAVENOUS

## 2016-08-22 MED ORDER — CEFPROZIL 500 MG PO TABS: ORAL_TABLET | ORAL | 0 refills | Status: DC

## 2016-08-22 MED ORDER — CEFPROZIL 250 MG PO TABS: ORAL_TABLET | ORAL | 0 refills | Status: DC

## 2016-08-22 MED ORDER — DOXYCYCLINE HYCLATE 100 MG PO CAPS: 100.0000 mg | ORAL_CAPSULE | Freq: Two times a day (BID) | ORAL | 0 refills | Status: DC

## 2016-08-22 MED ORDER — DOXYCYCLINE HYCLATE 100 MG PO TABS
100.0000 mg | ORAL_TABLET | Freq: Once | ORAL | Status: AC
  Administered 2016-08-22: 100 mg via ORAL
  Filled 2016-08-22: qty 1

## 2016-08-22 NOTE — ED Triage Notes (Signed)
PT states nasal congestion, productive thick sputum cough and burning with urination x2 days. EMS stated that her primary doctor had prescribed her antibiotics but they have not been started yet.

## 2016-08-22 NOTE — Telephone Encounter (Signed)
Pt is coughing up green mucous and is having urinary frequency. Pt's niece states that she don't think she can get her to come to the doctor and wants to know if something can be called in. I informed the pt that with her age she really needed to be seen. Niece insisted that I send a note back. Please advise.

## 2016-08-22 NOTE — ED Provider Notes (Signed)
[facility] DEPT Provider Note   CSN: [id number] Arrival date & time: 08/22/16  1726     History   Chief Complaint Chief Complaint  Patient presents with  . Cough    HPI [name] is a 83 y.o. female.  Patient complains of a cough for number days.  No shortness of breath   The history is provided by the patient. No language interpreter was used.  Cough  This is a new problem. The current episode started more than 2 days ago. The problem occurs constantly. The problem has not changed since onset.The cough is non-productive. There has been no fever. Pertinent negatives include no chest pain, no chills and no headaches.    Past Medical History:  Diagnosis Date  . Anemia   . 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 Chronic abdominal pain   . Chronic neck and back pain   . COPD (chronic obstructive pulmonary disease) ([place])   . GAD (generalized anxiety disorder)   . Gastroesophageal reflux disease   . Gastroparesis   . IBS (irritable bowel syndrome)   . Lung mass   . Migraines   . Osteoporosis   . 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 Renal insufficiency   . S/P colostomy ([place])   . Tachycardia     Patient Active Problem List   Diagnosis Date Noted  . Depression with anxiety 06/10/2016  . Pubic ramus fracture, left, closed, initial encounter ([place]) 06/10/2016  . Pubic ramus fracture, right, closed, initial encounter ([place]) 06/10/2016  . COPD (chronic obstructive pulmonary disease) ([place]) 06/10/2016  . Hypertension 06/10/2016  . Diabetes mellitus ([place]) 06/10/2016  . Hyponatremia 06/10/2016  . Leukocytosis 06/10/2016  . Fall at home 06/10/2016  . Pelvic fracture ([place]) 06/10/2016  . Closed bilateral fracture of pubic rami ([place])   . Fall   . Cachexia ([place]) 04/23/2016  . Frailty 01/23/2016  . Abdominal pain 01/01/2016  . Acute respiratory failure with hypoxia ([place]) 12/20/2015  . Dyspnea 07/25/2015  . Adult failure to thrive 05/30/2015  . 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  Hemoptysis 01/15/2015  . Pernicious anemia 09/13/2014  . Prediabetes 12/17/2013  . Hypothyroidism 10/04/2013  . Muscle cramps 05/03/2013  . Lung mass 03/17/2013  . Chronic headaches 11/11/2012  . Weight loss 09/21/2012  . Diarrhea 05/11/2012  . Gastroparesis 11/26/2011  . Flatulence 11/26/2011  . Osteoporosis 11/26/2011  . Ileostomy in place [facility]) 11/26/2011  . SCIATICA 08/24/2007    Past Surgical History:  Procedure Laterality Date  . ABDOMINAL HYSTERECTOMY    . APPENDECTOMY    . CHOLECYSTECTOMY    . ESOPHAGOGASTRODUODENOSCOPY    . ESOPHAGOGASTRODUODENOSCOPY N/A 08/25/2015   Procedure: ESOPHAGOGASTRODUODENOSCOPY (EGD);  Surgeon: Rogene Houston, MD;  Location: [facility]O SUITE;  Service: Endoscopy;  Laterality: N/A;  730  . ILEOSCOPY N/A 08/25/2015   Procedure: ILEOSCOPY THROUGH STOMA;  Surgeon: Rogene Houston, MD;  Location: [facility] ENDO SUITE;  Service: Endoscopy;  Laterality: N/A;  . PARTIAL GASTRECTOMY  1967   3/4 gastric resection for bleeding ulcers  . TONSILLECTOMY      OB History    Gravida Para Term Preterm AB Living             3   SAB TAB Ectopic Multiple Live Births                   Home Medications    Prior to Admission medications   Medication Sig Start Date End Date Taking? Authorizing Provider  acetaminophen (TYLENOL)  325 MG tablet Take 2 tablets (650 mg total) by mouth every 6 (six) hours as needed for mild pain or headache (or Fever >/= 101). 12/22/15  Yes Samuella Cota, MD  ATROVENT HFA 17 MCG/ACT inhaler 2 PUFFS FOUR TIMES DAILY. Patient taking differently: 2 PUFFS TWO TO FOUR TIMES DAILY. 07/30/16  Yes Kathyrn Drown, MD  cefPROZIL (CEFZIL) 250 MG tablet Take 250 mg by mouth 2 (two) times daily. [place]   Yes Historical Provider, MD  citalopram (CELEXA) 10 MG tablet TAKE (1) TABLET BY MOUTH ONCE DAILY. 07/01/16  Yes Kathyrn Drown, MD  diazepam (VALIUM) 5 MG tablet Take 0.5 tablets twice a daily as needed. Take sparingly (Prescription must last 30  days) Patient taking differently: Take 5 mg by mouth at bedtime. Take 0.5 tablets twice a daily as needed. Take sparingly (Prescription must last 30 days) 05/01/16  Yes Kathyrn Drown, MD  Doxylamine-DM (ROBITUSSIN NIGHTTIME COUGH DM) 12.5-30 MG/10ML LIQD Take by mouth. Patient taked 1 teaspoon as needed.   Yes Historical Provider, MD  levothyroxine (SYNTHROID, LEVOTHROID) 25 MCG tablet TAKE 2 TABLETS ON MONDAY AND FRIDAY. & 1 TABLET ALL OTHER DAYS. 05/09/16  Yes Kathyrn Drown, MD  lipase/protease/amylase (CREON) 12000 units CPEP capsule Take 2 capsules (24,000 Units total) by mouth 3 (three) times daily with meals. Patient taking differently: Take 12,000-24,000 Units by mouth 3 (three) times daily with meals. 2 with meals and 1 with snacks 03/12/16  Yes Rogene Houston, MD  Melatonin 5 MG CAPS Take 5 mg by mouth at bedtime.   Yes Historical Provider, MD  memantine (NAMENDA) 10 MG tablet TAKE (1) TABLET TWICE DAILY. 07/30/16  Yes Kathyrn Drown, MD  metoprolol tartrate (LOPRESSOR) 25 MG tablet TAKE 1/2 TABLET TWICE DAILY FOR HIGH BLOOD PRESSURE. 07/01/16  Yes Kathyrn Drown, MD  potassium chloride (K-DUR) 10 MEQ tablet TAKE (1) TABLET BY MOUTH ONCE DAILY. 07/01/16  Yes Kathyrn Drown, MD  Simethicone (GAS RELIEF PO) Take 1 tablet by mouth 3 (three) times daily after meals.   Yes Historical Provider, MD  topiramate (TOPAMAX) 25 MG tablet Take 25 mg by mouth daily.   Yes Historical Provider, MD  venlafaxine XR (EFFEXOR-XR) 75 MG 24 hr capsule TAKE 1 CAPSULE ONCE DAILY WITH BREAKFAST. 05/09/16  Yes Kathyrn Drown, MD  doxycycline (VIBRAMYCIN) 100 MG capsule Take 1 capsule (100 mg total) by mouth 2 (two) times daily. One po bid x 7 days 08/22/16   Milton Ferguson, MD  HYDROcodone-acetaminophen (NORCO/VICODIN) 5-325 MG tablet Take 1 tablet by mouth every 6 (six) hours as needed. Caution drowiness Patient not taking: Reported on 08/22/2016 06/14/16   Kathyrn Drown, MD    Family History Family History  Problem  Relation Age of Onset  . Stroke Mother   . Stroke Father   . Diabetes Sister   . Diabetes Brother   . Stroke Brother   . Cancer Sister     unknown type  . Diabetes Sister   . Emphysema Sister   . Diabetes Brother   . Stroke Brother   . Diabetes Son   . Irritable bowel syndrome Son   . Diabetes Son   . Hypertension Son     Social History Social History  Substance Use Topics  . Smoking status: Never Smoker  . Smokeless tobacco: Never Used  . Alcohol use No     Allergies   Levaquin [levofloxacin]; Tetanus toxoid; Zolpidem tartrate; and Fosamax [alendronate sodium]  Review of Systems Review of Systems  Constitutional: Negative for appetite change, chills and fatigue.  HENT: Negative for congestion, ear discharge and sinus pressure.   Eyes: Negative for discharge.  Respiratory: Positive for cough.   Cardiovascular: Negative for chest pain.  Gastrointestinal: Negative for abdominal pain and diarrhea.  Genitourinary: Negative for frequency and hematuria.  Musculoskeletal: Negative for back pain.  Skin: Negative for rash.  Neurological: Negative for seizures and headaches.  Psychiatric/Behavioral: Negative for hallucinations.     Physical Exam Updated Vital Signs BP 134/56   Pulse 66   Temp 98.2 F (36.8 C) (Oral)   Resp 17   Ht '5\' 1"'$  (1.549 m)   Wt 75 lb (34 kg)   SpO2 100%   BMI 14.17 kg/m   Physical Exam  Constitutional: She is oriented to person, place, and time. She appears well-developed.  HENT:  Head: Normocephalic.  Eyes: Conjunctivae and EOM are normal. No scleral icterus.  Neck: Neck supple. No thyromegaly present.  Cardiovascular: Normal rate and regular rhythm.  Exam reveals no gallop and no friction rub.   No murmur heard. Pulmonary/Chest: No stridor. She has no wheezes. She has no rales. She exhibits no tenderness.  Abdominal: She exhibits no distension. There is no tenderness. There is no rebound.  Musculoskeletal: Normal range of motion.  She exhibits no edema.  Lymphadenopathy:    She has no cervical adenopathy.  Neurological: She is oriented to person, place, and time. She exhibits normal muscle tone. Coordination normal.  Skin: No rash noted. No erythema.  Psychiatric: She has a normal mood and affect. Her behavior is normal.     ED Treatments / Results  Labs (all labs ordered are listed, but only abnormal results are displayed) Labs Reviewed  CBC WITH DIFFERENTIAL/PLATELET - Abnormal; Notable for the following:       Result Value   WBC 11.3 (*)    RDW 15.6 (*)    Monocytes Absolute 1.3 (*)    All other components within normal limits  COMPREHENSIVE METABOLIC PANEL - Abnormal; Notable for the following:    Sodium 131 (*)    Chloride 96 (*)    Glucose, Bld 140 (*)    BUN 21 (*)    ALT 11 (*)    Alkaline Phosphatase 130 (*)    GFR calc non Af Amer 51 (*)    GFR calc Af Amer 59 (*)    All other components within normal limits  URINALYSIS, ROUTINE W REFLEX MICROSCOPIC - Abnormal; Notable for the following:    Color, Urine AMBER (*)    Nitrite POSITIVE (*)    All other components within normal limits    EKG  EKG Interpretation None       Radiology Dg Chest 2 View  Result Date: 08/22/2016 CLINICAL DATA:  Patient with shortness of breath and nasal congestion. EXAM: CHEST  2 VIEW COMPARISON:  Chest radiograph 06/07/2016; chest CT 06/12/2015. FINDINGS: Stable enlarged cardiac and mediastinal contours. Tortuosity and calcification of the thoracic aorta. Suggestion of spiculated mass within the right upper lung measuring 1.7 cm. Additional scattered patchy opacities are grossly stable compared to prior exams. Mid thoracic spine degenerative changes. Suggestion of mild height loss superior thoracic spine vertebral body. Upper abdominal surgical clips. IMPRESSION: Suggestion of a 1.7 cm spiculated nodule within the right upper lung. Recommend dedicated evaluation with chest CT. Persistent chronic lung disease with  scattered patchy opacities. Aortic atherosclerosis. Electronically Signed   By: Polly Cobia.D.  On: 08/22/2016 18:28   Ct Chest W Contrast  Result Date: 08/22/2016 CLINICAL DATA:  Abnormal chest x-ray EXAM: CT CHEST WITH CONTRAST TECHNIQUE: Multidetector CT imaging of the chest was performed during intravenous contrast administration. CONTRAST:  62m ISOVUE-300 IOPAMIDOL (ISOVUE-300) INJECTION 61% COMPARISON:  Chest x-ray 08/22/2016, CT chest 06/12/2015, chest x-ray 06/07/2016, CT 11/01/2014 FINDINGS: Cardiovascular: Atherosclerosis of the aorta with the elongated appearance of the arch. No dissection. No aneurysm. Mild cardiomegaly with biatrial enlargement. Coronary artery calcifications. No large pericardial effusion. Mediastinum/Nodes: Diffuse air distention of the esophagus. Trachea is midline. Scattered nodes within the mediastinum, the most prominent are seen in the AP window and measure up to 8 mm. Thyroid is without focal mass. Lungs/Pleura: Multifocal areas of scarring are again visualized throughout all lobes of the lung. Multiple bilateral cavitary lesions are again noted, left upper lobe cavitary lesion with calcification is grossly unchanged. Superior segment right lower lobe cavitary mass with spiculation measures 2.1 cm, stable compared with multiple priors, and likely corresponding to radiographic abnormality. New bronchiectasis in the left lower lobe with bronchial wall thickening. Multiple soft tissue nodules evident in the left lower lobe. No pleural effusion. Upper Abdomen: Cortical scarring in the left kidney. Hiatal hernia. Postsurgical changes in the left upper quadrant. Musculoskeletal: Degenerative changes. No acute or suspicious bone lesions. 3.6 cm lipoma of the left upper back. IMPRESSION: 1. Multifocal areas of scarring similar compared to prior CT. Multiple bilateral cavitary lesions which were present on the prior examination and could relate to sequela of prior infection, favor  granulomatous the etiology such as TB. Spiculated cavitary lesion in the superior segment of right lower lobe is felt to correspond to the radiographic abnormality. 2. Multiple new slightly spiculated nodules in the left lower lobe, suspect that these are inflammatory or infectious. There is new bronchiectasis in the left lower lobe with bronchial thickening. Short interval CT follow-up in 3-6 months is suggested. 3. Mild mediastinal adenopathy. 4. Diffuse air distention of the esophagus. Electronically Signed   By: KDonavan FoilM.D.   On: 08/22/2016 20:02    Procedures Procedures (including critical care time)  Medications Ordered in ED Medications  doxycycline (VIBRA-TABS) tablet 100 mg (not administered)  iopamidol (ISOVUE-300) 61 % injection 75 mL (75 mLs Intravenous Contrast Given 08/22/16 1924)     Initial Impression / Assessment and Plan / ED Course  I have reviewed the triage vital signs and the nursing notes.  Pertinent labs & imaging results that were available during my care of the patient were reviewed by me and considered in my medical decision making (see chart for details).     Patient nontoxic not hypoxic.  CT shows inflammatory versus infectious process.  Patient will be put on doxycycline and will follow up with PCP  Final Clinical Impressions(s) / ED Diagnoses   Final diagnoses:  Cough    New Prescriptions New Prescriptions   DOXYCYCLINE (VIBRAMYCIN) 100 MG CAPSULE    Take 1 capsule (100 mg total) by mouth 2 (two) times daily. One po bid x 7 days     JMilton Ferguson MD 08/22/16 2055

## 2016-08-22 NOTE — ED Notes (Signed)
Lab at the bedside 

## 2016-08-22 NOTE — Discharge Instructions (Signed)
Follow up with your md next week. °

## 2016-08-22 NOTE — Telephone Encounter (Signed)
Spoke with patient's niece and informed her per Dr.Scott Luking- Recommend Cefzil 250 mg 1 twice a day for the next 7 days if worsening issues or fever or other problems will need to be seen. Patient verbalized understanding.

## 2016-08-22 NOTE — Telephone Encounter (Signed)
Recommend Cefzil 250 mg 1 twice a day for the next 7 days if worsening issues or fever or other problems will need to be seen

## 2016-08-22 NOTE — ED Notes (Signed)
Pt on bedpan, family in the room

## 2016-08-26 ENCOUNTER — Telehealth: Payer: Self-pay | Admitting: Family Medicine

## 2016-08-26 NOTE — Telephone Encounter (Signed)
Advised POA If she does not seem her usual self Dr Nicki Reaper believes it would be reasonable for the patient to follow-up-currently he recommends following up on Tuesday-currently only appointments available for this evening in that would expose the patient to a lot of flu-talk with family make sure the patient is not having any critical issues right at the moment otherwise follow-up on Tuesday. POA verbalized understanding and stated her oxygen levels are goo and she was up 2 days straight and now sleeping a lot so she thinks she just catching up on sleep but will carry her back to ER if patient gets worse or becomes lethargic. Appointment for follow up scheduled by front.

## 2016-08-26 NOTE — Telephone Encounter (Signed)
Patient seen at ER on 08/22/16 for cough.  Patients niece wants to know if she needs to follow up at our office?  She is still sort of sluggish.  Thurs and Fri night she stayed up all night long.  Sat and Nancy Fetter she has been sleeping a lot.  Please advise.

## 2016-08-26 NOTE — Telephone Encounter (Signed)
If she does not seem her usual self I believe it would be reasonable for the patient to follow-up-currently I recommend following up on Tuesday-currently only appointments available R this evening in that would expose the patient to a lot of flu-talk with family make sure the patient is not having any critical issues right at the moment otherwise follow-up on Tuesday

## 2016-08-26 NOTE — Telephone Encounter (Signed)
Patient seen in ER with cough for few days- Er stated test showed inflammatory process vs infection- patient given Doxycycline and advised to follow up with PCP

## 2016-08-27 ENCOUNTER — Ambulatory Visit: Payer: Medicare Other | Admitting: Family Medicine

## 2016-09-17 ENCOUNTER — Ambulatory Visit (INDEPENDENT_AMBULATORY_CARE_PROVIDER_SITE_OTHER): Payer: Medicare Other | Admitting: Internal Medicine

## 2016-09-17 ENCOUNTER — Encounter (INDEPENDENT_AMBULATORY_CARE_PROVIDER_SITE_OTHER): Payer: Self-pay | Admitting: Internal Medicine

## 2016-09-17 VITALS — BP 100/66 | HR 62 | Temp 98.4°F | Resp 16 | Ht 59.0 in | Wt 73.3 lb

## 2016-09-17 DIAGNOSIS — Z932 Ileostomy status: Secondary | ICD-10-CM | POA: Diagnosis not present

## 2016-09-17 DIAGNOSIS — K909 Intestinal malabsorption, unspecified: Secondary | ICD-10-CM

## 2016-09-17 DIAGNOSIS — R634 Abnormal weight loss: Secondary | ICD-10-CM | POA: Diagnosis not present

## 2016-09-17 MED ORDER — LOPERAMIDE HCL 2 MG PO CAPS: 2.0000 mg | ORAL_CAPSULE | Freq: Three times a day (TID) | ORAL | 5 refills | Status: DC

## 2016-09-17 NOTE — Patient Instructions (Signed)
Take Imodium OTC 2 mg by mouth 3 times a day. If possible estimate stool volume for 24 hours 6 and call with progress report in 2-3 weeks.

## 2016-09-17 NOTE — Progress Notes (Signed)
Presenting complaint;  Follow-up for steatorrhea and weight loss.  Subjective:  Patient is 83 year old Caucasian female who is here for scheduled visit. She is accompanied by one of her helpers Diane. She was last seen on 05/31/2016 when she weighed 75 pounds. Today she was 73 pounds. Patient states that she fell at home in November and sustained pelvic fracture and was hospitalized. She was felt to have dehydration. She also had cellulitis involving right wrist and received short course of antibiotic. She states she lost more than 10 pounds. She states her weight was less than 70 pounds when she was discharged. She believes she has gained few pounds. She says her appetite is good. She eats multiple small meals. She continues to complain of bloating and excessive flatus. She states gas pill has helped. She feels she is still passing large volume stool. She denies abdominal pain melena or rectal bleeding.    Current Medications: Outpatient Encounter Prescriptions as of 09/17/2016  Medication Sig  . acetaminophen (TYLENOL) 325 MG tablet Take 2 tablets (650 mg total) by mouth every 6 (six) hours as needed for mild pain or headache (or Fever >/= 101).  . ATROVENT HFA 17 MCG/ACT inhaler 2 PUFFS FOUR TIMES DAILY. (Patient taking differently: 2 PUFFS TWO TO FOUR TIMES DAILY.)  . citalopram (CELEXA) 10 MG tablet TAKE (1) TABLET BY MOUTH ONCE DAILY.  . diazepam (VALIUM) 5 MG tablet Take 0.5 tablets twice a daily as needed. Take sparingly (Prescription must last 30 days) (Patient taking differently: Take 5 mg by mouth at bedtime. Take 0.5 tablets twice a daily as needed. Take sparingly (Prescription must last 30 days))  . Doxylamine-DM (ROBITUSSIN NIGHTTIME COUGH DM) 12.5-30 MG/10ML LIQD Take by mouth. Patient taked 1 teaspoon as needed.  Marland Kitchen levothyroxine (SYNTHROID, LEVOTHROID) 25 MCG tablet TAKE 2 TABLETS ON MONDAY AND FRIDAY. & 1 TABLET ALL OTHER DAYS.  Marland Kitchen lipase/protease/amylase (CREON) 12000 units CPEP  capsule Take 2 capsules (24,000 Units total) by mouth 3 (three) times daily with meals. (Patient taking differently: Take 12,000-24,000 Units by mouth 3 (three) times daily with meals. 2 with meals and 1 with snacks)  . Melatonin 5 MG CAPS Take 5 mg by mouth at bedtime.  . memantine (NAMENDA) 10 MG tablet TAKE (1) TABLET TWICE DAILY.  . metoprolol tartrate (LOPRESSOR) 25 MG tablet TAKE 1/2 TABLET TWICE DAILY FOR HIGH BLOOD PRESSURE.  Marland Kitchen potassium chloride (K-DUR) 10 MEQ tablet TAKE (1) TABLET BY MOUTH ONCE DAILY.  . Simethicone (GAS RELIEF PO) Take 1 tablet by mouth 3 (three) times daily after meals.  . topiramate (TOPAMAX) 25 MG tablet Take 25 mg by mouth daily.  Marland Kitchen venlafaxine XR (EFFEXOR-XR) 75 MG 24 hr capsule TAKE 1 CAPSULE ONCE DAILY WITH BREAKFAST.  . [DISCONTINUED] cefPROZIL (CEFZIL) 250 MG tablet Take 250 mg by mouth 2 (two) times daily. 7 DAY COURSE  . [DISCONTINUED] doxycycline (VIBRAMYCIN) 100 MG capsule Take 1 capsule (100 mg total) by mouth 2 (two) times daily. One po bid x 7 days (Patient not taking: Reported on 09/17/2016)  . [DISCONTINUED] HYDROcodone-acetaminophen (NORCO/VICODIN) 5-325 MG tablet Take 1 tablet by mouth every 6 (six) hours as needed. Caution drowiness (Patient not taking: Reported on 08/22/2016)   No facility-administered encounter medications on file as of 09/17/2016.      Objective: Blood pressure 100/66, pulse 62, temperature 98.4 F (36.9 C), temperature source Oral, resp. rate 16, height '4\' 11"'$  (1.499 m), weight 73 lb 4.8 oz (33.2 kg). Patient is alert and in no acute  distress. She is using walker for ambulation. Conjunctiva is pink. Sclera is nonicteric Oropharyngeal mucosa is normal. No neck masses or thyromegaly noted. Cardiac exam with regular rhythm normal S1 and S2. No murmur or gallop noted. Auscultation of lungs reveal dry crackles over left lung posteriorly. Abdomen is flat. Bowel sounds are normal. She has ileostomy in right low quadrant. Ileostomy  bag has take brownish stool. Abdomen soft and nontender without organomegaly or masses. Percussion note has not tympanitic. No LE edema or clubbing noted.   Assessment:  #1. Steatorrhea secondary to pancreatic insufficiency. Patient is on pancreatic enzyme supplement. Dose may have to be adjusted. Will wait until next office visit. #2. Weight loss. She has been gradually losing weight over the last few years. Every time she starts to gained few pounds she has a setback leading to more weight loss. #3. Patient has ileostomy and presently not having any problems. She had ileostomy several years ago for colonic obstruction secondary to Alosetron and extensive adhesions.   Plan:  Patient advised to take Imodium OTC 2 mg by mouth 3 times a day. Her helpers will do stool volume daily 6 and call with progress report later this month. Office visit in 3 months.

## 2016-09-18 ENCOUNTER — Encounter (INDEPENDENT_AMBULATORY_CARE_PROVIDER_SITE_OTHER): Payer: Self-pay | Admitting: Internal Medicine

## 2016-09-18 ENCOUNTER — Encounter (INDEPENDENT_AMBULATORY_CARE_PROVIDER_SITE_OTHER): Payer: Self-pay

## 2016-09-19 ENCOUNTER — Other Ambulatory Visit: Payer: Self-pay | Admitting: *Deleted

## 2016-09-19 NOTE — Telephone Encounter (Signed)
May have 2 refills on value him 6 refills on the other meds

## 2016-09-20 MED ORDER — DIAZEPAM 5 MG PO TABS: ORAL_TABLET | ORAL | 2 refills | Status: DC

## 2016-09-20 MED ORDER — IPRATROPIUM BROMIDE HFA 17 MCG/ACT IN AERS: INHALATION_SPRAY | RESPIRATORY_TRACT | 5 refills | Status: AC

## 2016-09-20 MED ORDER — MEMANTINE HCL 10 MG PO TABS: ORAL_TABLET | ORAL | 5 refills | Status: DC

## 2016-10-04 ENCOUNTER — Encounter: Payer: Self-pay | Admitting: Family Medicine

## 2016-10-04 ENCOUNTER — Ambulatory Visit (INDEPENDENT_AMBULATORY_CARE_PROVIDER_SITE_OTHER): Payer: Medicare Other | Admitting: Family Medicine

## 2016-10-04 VITALS — Temp 98.2°F | Ht 59.0 in | Wt 74.2 lb

## 2016-10-04 DIAGNOSIS — R339 Retention of urine, unspecified: Secondary | ICD-10-CM

## 2016-10-04 DIAGNOSIS — R300 Dysuria: Secondary | ICD-10-CM

## 2016-10-04 DIAGNOSIS — N3000 Acute cystitis without hematuria: Secondary | ICD-10-CM

## 2016-10-04 LAB — POCT URINALYSIS DIPSTICK
PH UA: 6 (ref 5.0–8.0)
SPEC GRAV UA: 1.02 (ref 1.030–1.035)

## 2016-10-04 MED ORDER — TRIAMCINOLONE ACETONIDE 0.1 % EX CREA: TOPICAL_CREAM | CUTANEOUS | 4 refills | Status: DC

## 2016-10-04 MED ORDER — CEFPROZIL 250 MG PO TABS: ORAL_TABLET | ORAL | 0 refills | Status: DC

## 2016-10-04 NOTE — Progress Notes (Signed)
   Subjective:    Patient ID: [name], female    DOB: 1928-10-30, 83 y.o.   MRN: [id number]  HPI Patient arrives with c/o trouble urinatring Patient has been having trouble urinating over the past several weeks. Sits down urinate feels like she needs to but only a small amount comes out. No vomiting or wheezing no high fever chills or sweats. No nausea vomiting diarrhea. Eating fairly well. Has had problems with pulmonary issues was recently in the hospital please see ER notes and CAT scan. Review of Systems    please see above. Objective:   Physical Exam   Lungs clear hearts regular HEENT is benign extremities no edema Urinalysis with wbc's     Assessment & Plan:  Possible UTI antibodies prescribed Culture ordered Patient was instructed to stop Benadryl-she been taking a lot of Benadryl for itching I believe that was causing urinary retention  urinary retention see discussion above Kenalog cream as needed for itching

## 2016-10-06 LAB — URINE CULTURE

## 2016-10-29 ENCOUNTER — Other Ambulatory Visit: Payer: Self-pay | Admitting: Family Medicine

## 2016-10-29 ENCOUNTER — Other Ambulatory Visit: Payer: Self-pay | Admitting: *Deleted

## 2016-10-29 DIAGNOSIS — N390 Urinary tract infection, site not specified: Secondary | ICD-10-CM | POA: Diagnosis not present

## 2016-10-29 MED ORDER — CEFDINIR 300 MG PO CAPS: 300.0000 mg | ORAL_CAPSULE | Freq: Two times a day (BID) | ORAL | 0 refills | Status: DC

## 2016-10-29 NOTE — Telephone Encounter (Signed)
Last seen 10/04/16

## 2016-10-31 LAB — PLEASE NOTE

## 2016-10-31 LAB — URINE CULTURE

## 2016-11-25 ENCOUNTER — Other Ambulatory Visit: Payer: Self-pay | Admitting: Family Medicine

## 2016-11-25 MED ORDER — DIAZEPAM 2 MG PO TABS: ORAL_TABLET | ORAL | 2 refills | Status: DC

## 2016-11-27 ENCOUNTER — Telehealth (INDEPENDENT_AMBULATORY_CARE_PROVIDER_SITE_OTHER): Payer: Self-pay | Admitting: *Deleted

## 2016-11-27 NOTE — Telephone Encounter (Signed)
Nile Dear came to the office this morning. She states that Lucyle has been picking at her stoma because she has a fear that it is going to close up. Around the stoma it is red,burning. Irritated.  She says that [place] is eating good . Her weight on her home scale is 67 lbs.  Patient was offered an appointment with Lelon Perla but niece,Barbara says patient will not do that. She also expressed that Karna Christmas has been nothing but goo to her.  Talked with Dr.Rehman - he ask that the Mycolog (II)  cream be called in for the patient. Apply to the affected area twice a day. 15 gram. This was [facility] to [facility].  Dr.Rehman also ask that a referral be made to home health and arrange a dietary consult.  [facility] contacted. In order for them to see her. We would have to have a face to face visit /OV note for them.  Niece states that the patient is eating good for her. Will address with Dr.Rehman both the Home health and Dietary issue.

## 2016-11-29 ENCOUNTER — Telehealth (INDEPENDENT_AMBULATORY_CARE_PROVIDER_SITE_OTHER): Payer: Self-pay | Admitting: *Deleted

## 2016-11-29 NOTE — Telephone Encounter (Signed)
Insurance will not pay for the Mycolog Cream II cream. Per Deberah Castle, NP may call in the Nystatin Cream - apply to the affected area BID-15 gram - Triamcinolone - apply to the affected area BID - 15 gram. This was called to the [facility] . A telephone message was left for [facility].

## 2016-11-29 NOTE — Telephone Encounter (Signed)
Nile Dear called for patient and left a message stating the medication Dr Laural Golden called in her insurance won't cover. [name] requested a call back to follow up on medication. [phone]

## 2016-12-04 ENCOUNTER — Other Ambulatory Visit: Payer: Self-pay | Admitting: Family Medicine

## 2016-12-06 ENCOUNTER — Ambulatory Visit (HOSPITAL_COMMUNITY)
Admission: RE | Admit: 2016-12-06 | Discharge: 2016-12-06 | Disposition: A | Discharge status: Home / Self Care | Payer: Medicare Other | Source: Ambulatory Visit | Attending: Family Medicine | Admitting: Family Medicine

## 2016-12-06 ENCOUNTER — Ambulatory Visit (INDEPENDENT_AMBULATORY_CARE_PROVIDER_SITE_OTHER): Payer: Medicare Other | Admitting: Family Medicine

## 2016-12-06 ENCOUNTER — Encounter: Payer: Self-pay | Admitting: Family Medicine

## 2016-12-06 VITALS — BP 120/80 | Ht 59.0 in | Wt 71.2 lb

## 2016-12-06 DIAGNOSIS — R634 Abnormal weight loss: Secondary | ICD-10-CM | POA: Diagnosis not present

## 2016-12-06 DIAGNOSIS — R918 Other nonspecific abnormal finding of lung field: Secondary | ICD-10-CM | POA: Diagnosis not present

## 2016-12-06 DIAGNOSIS — I10 Essential (primary) hypertension: Secondary | ICD-10-CM | POA: Diagnosis not present

## 2016-12-06 DIAGNOSIS — R05 Cough: Secondary | ICD-10-CM | POA: Insufficient documentation

## 2016-12-06 DIAGNOSIS — E119 Type 2 diabetes mellitus without complications: Secondary | ICD-10-CM

## 2016-12-06 DIAGNOSIS — E039 Hypothyroidism, unspecified: Secondary | ICD-10-CM | POA: Diagnosis not present

## 2016-12-06 DIAGNOSIS — R059 Cough, unspecified: Secondary | ICD-10-CM

## 2016-12-06 LAB — POCT GLYCOSYLATED HEMOGLOBIN (HGB A1C): HEMOGLOBIN A1C: 5.9

## 2016-12-06 MED ORDER — VENLAFAXINE HCL ER 37.5 MG PO CP24
ORAL_CAPSULE | ORAL | 5 refills | Status: DC
Start: 2016-12-06 — End: 2017-04-30

## 2016-12-06 MED ORDER — CEFPROZIL 250 MG PO TABS: 250.0000 mg | ORAL_TABLET | Freq: Two times a day (BID) | ORAL | 0 refills | Status: DC

## 2016-12-06 NOTE — Progress Notes (Signed)
   Subjective:    Patient ID: [name], female    DOB: 09/27/1928, 83 y.o.   MRN: [id number]  Diabetes  She presents for her follow-up diabetic visit. She has type 2 diabetes mellitus. Eye exam is not current.   This patient has diet controlled diabetes In addition to this she has hypothyroidism She also has history of depression and also history of anxiety She also suffers with tachycardia and suffers with frailty associated with her low weight she states she eats well she denies high fever chills she does relate some sinus congestion and some drainage and coughing denies wheezing or shortness of breath currently Patient has concerns of cough and would like a chest xray and also sinus headache and allergies.   Results for orders placed or performed in visit on 12/06/16  POCT HgB A1C  Result Value Ref Range   Hemoglobin A1C 5.9   25 minutes was spent with the patient. Greater than half the time was spent in discussion and answering questions and counseling regarding the issues that the patient came in for today.   Review of Systems Denies any chest tightness pressure pain shortness breath nausea and diarrhea   she denies high fever chills sweats Objective:   Physical Exam Lungs are clear hearts regular HEENT is benign Pulse normal patient frail mildly cachectic      Assessment & Plan:  D/c topiramax Reduce Effexor Chest x-ray ordered Patient has gone through extensive CAT scans and testing was told she does not have TB Labs were ordered await results Recheck patient in approximate 3 months  The patient is aware of her precarious situation-I did discuss with her niece [name] with her frailness and reduction in weight she runs a risk of having a disease progression that could cause her demise

## 2016-12-07 LAB — CBC WITH DIFFERENTIAL/PLATELET
BASOS ABS: 0 10*3/uL (ref 0.0–0.2)
BASOS: 0 %
EOS (ABSOLUTE): 0.3 10*3/uL (ref 0.0–0.4)
Eos: 2 %
HEMATOCRIT: 42.1 % (ref 34.0–46.6)
HEMOGLOBIN: 13.6 g/dL (ref 11.1–15.9)
Immature Grans (Abs): 0 10*3/uL (ref 0.0–0.1)
Immature Granulocytes: 0 %
LYMPHS ABS: 4 10*3/uL — AB (ref 0.7–3.1)
Lymphs: 24 %
MCH: 32.2 pg (ref 26.6–33.0)
MCHC: 32.3 g/dL (ref 31.5–35.7)
MCV: 100 fL — ABNORMAL HIGH (ref 79–97)
MONOCYTES: 8 %
Monocytes Absolute: 1.2 10*3/uL — ABNORMAL HIGH (ref 0.1–0.9)
NEUTROS ABS: 10.2 10*3/uL — AB (ref 1.4–7.0)
Neutrophils: 66 %
PLATELETS: 366 10*3/uL (ref 150–379)
RBC: 4.23 x10E6/uL (ref 3.77–5.28)
RDW: 16.4 % — ABNORMAL HIGH (ref 12.3–15.4)
WBC: 15.8 10*3/uL — ABNORMAL HIGH (ref 3.4–10.8)

## 2016-12-07 LAB — T4, FREE: Free T4: 1.09 ng/dL (ref 0.82–1.77)

## 2016-12-07 LAB — BASIC METABOLIC PANEL
BUN/Creatinine Ratio: 20 (ref 12–28)
BUN: 16 mg/dL (ref 8–27)
CALCIUM: 9.7 mg/dL (ref 8.7–10.3)
CO2: 26 mmol/L (ref 18–29)
Chloride: 97 mmol/L (ref 96–106)
Creatinine, Ser: 0.81 mg/dL (ref 0.57–1.00)
GFR calc Af Amer: 77 mL/min/{1.73_m2} (ref >59)
GFR, EST NON AFRICAN AMERICAN: 66 mL/min/{1.73_m2} (ref >59)
GLUCOSE: 96 mg/dL (ref 65–99)
Potassium: 4.2 mmol/L (ref 3.5–5.2)
Sodium: 138 mmol/L (ref 134–144)

## 2016-12-07 LAB — TSH: TSH: 1.21 u[IU]/mL (ref 0.450–4.500)

## 2016-12-10 ENCOUNTER — Encounter (INDEPENDENT_AMBULATORY_CARE_PROVIDER_SITE_OTHER): Payer: Self-pay | Admitting: Internal Medicine

## 2016-12-10 ENCOUNTER — Ambulatory Visit (INDEPENDENT_AMBULATORY_CARE_PROVIDER_SITE_OTHER): Payer: Medicare Other | Admitting: Internal Medicine

## 2016-12-10 VITALS — BP 98/66 | HR 60 | Temp 98.4°F | Resp 18 | Ht 59.0 in | Wt 72.6 lb

## 2016-12-10 DIAGNOSIS — R634 Abnormal weight loss: Secondary | ICD-10-CM | POA: Diagnosis not present

## 2016-12-10 DIAGNOSIS — K9413 Enterostomy malfunction: Secondary | ICD-10-CM | POA: Diagnosis not present

## 2016-12-10 DIAGNOSIS — D171 Benign lipomatous neoplasm of skin and subcutaneous tissue of trunk: Secondary | ICD-10-CM

## 2016-12-10 MED ORDER — ELDERTONIC PO ELIX: 15.0000 mL | ORAL_SOLUTION | Freq: Every day | ORAL | 0 refills | Status: DC

## 2016-12-10 NOTE — Patient Instructions (Signed)
Continue triamcinolone and Mycostatin cream for another 10-14 days.

## 2016-12-10 NOTE — Progress Notes (Signed)
Presenting complaint;  Ileostomy issues. Parastomal lump.  Subjective:  Patient is 83 year old Caucasian female who is here for scheduled visit. Her niece called last week stating that her she felt her mucosa at ileostomy did not appear like before. Patient was also concerned about small lump to the right side of ileostomy. He was called in prescription for triamcinolone is low and Mycostatin to be applied to ileostomy until this visit. According to her niece she is always scratching or pushing her ileostomy bag. There is no history of frank bleeding or melena. Her helper has noted scant amount of blood from the free sometimes. Stool is soft to loose. Patient states she is having much less problem with flatulence since she has been on pancreatic enzyme supplement. She has lost 1 pound since her last visit. She does not understand why she cannot gain weight given her descent appetite. Patient also wants to try Eldertonic. Patient was seen by Dr. [name] last week. Blood work revealed leukocytosis. Chest film showed multifocal patchy nodular opacities thought to be due to bronchiectasis.  She was begun on Cefzil.    Current Medications: Outpatient Encounter Prescriptions as of 12/10/2016  Medication Sig  . acetaminophen (TYLENOL) 325 MG tablet Take 2 tablets (650 mg total) by mouth every 6 (six) hours as needed for mild pain or headache (or Fever >/= 101).  . cefPROZIL (CEFZIL) 250 MG tablet Take 1 tablet (250 mg total) by mouth 2 (two) times daily.  . citalopram (CELEXA) 10 MG tablet TAKE (1) TABLET BY MOUTH ONCE DAILY.  . diazepam (VALIUM) 2 MG tablet Take 1 tablets twice a daily as needed. Take sparingly (Prescription must last 30 days)  . Doxylamine-DM (ROBITUSSIN NIGHTTIME COUGH DM) 12.5-30 MG/10ML LIQD Take by mouth. Patient taked 1 teaspoon as needed.  Marland Kitchen ipratropium (ATROVENT HFA) 17 MCG/ACT inhaler 2 PUFFS FOUR TIMES DAILY.  Marland Kitchen levothyroxine (SYNTHROID, LEVOTHROID) 25 MCG tablet TAKE  2 TABLETS BY MOUTH ON MONDAY AND FRIDAY. & 1 TABLET ALL OTHER DAYS  . lipase/protease/amylase (CREON) 12000 units CPEP capsule Take 2 capsules (24,000 Units total) by mouth 3 (three) times daily with meals. (Patient taking differently: Take 12,000-24,000 Units by mouth 3 (three) times daily with meals. 2 with meals and 1 with snacks)  . loperamide (IMODIUM) 2 MG capsule Take 1 capsule (2 mg total) by mouth 3 (three) times daily before meals.  . Melatonin 5 MG CAPS Take 5 mg by mouth at bedtime.  . memantine (NAMENDA) 10 MG tablet TAKE (1) TABLET TWICE DAILY.  . metoprolol tartrate (LOPRESSOR) 25 MG tablet TAKE 1/2 TABLET TWICE DAILY FOR HIGH BLOOD PRESSURE.  . nystatin cream (MYCOSTATIN)   . potassium chloride (K-DUR) 10 MEQ tablet TAKE (1) TABLET BY MOUTH ONCE DAILY.  . Simethicone (GAS RELIEF PO) Take 1 tablet by mouth 3 (three) times daily after meals.  . venlafaxine XR (EFFEXOR-XR) 37.5 MG 24 hr capsule TAKE 1 CAPSULE ONCE DAILY WITH BREAKFAST.  . [DISCONTINUED] triamcinolone cream (KENALOG) 0.1 % Apply to area that itch 2 times a day as needed (Patient not taking: Reported on 12/10/2016)   No facility-administered encounter medications on file as of 12/10/2016.      Objective: Blood pressure 98/66, pulse 60, temperature 98.4 F (36.9 C), temperature source Oral, resp. rate 18, height 4\' 11"  (1.499 m), weight 72 lb 9.6 oz (32.9 kg). Patient is alert and in no acute distress. Conjunctiva is pink. Sclera is nonicteric Abdomen is flat. Mucosa at ileostomy appears to be erythematous but  there are no erosions. Digital examination is normal. There is soft about 12 mm subcutaneous lesion on the right side of ileostomy which slips on palpation. Abdomen is soft and nontender without organomegaly or masses. No LE edema or clubbing noted.     Assessment:  #1. Mucosa at ileostomy appears to be irritated. No ulceration noted. These changes may be due to physical injury. Will continue topical  therapy with triamcinolone and Mycostatin for 10-14 days. She had ileoscopy in February 2017 and was unremarkable. No further testing needed at this time. Patient should refrain from scratching this area. #2. Small subcutaneous lump to the right of ileostomy has corrected wrist 6 features of a lipoma. No indication for excision. #3. Weight loss. She is continuing to lose weight despite being on pancreatic enzyme supplement. Weight loss may be related to chronic lung condition.   Plan:  Patient and family members reassured. Use topical therapy to ileostomy for 10-14 days. Office visit in 4 months.

## 2016-12-11 ENCOUNTER — Other Ambulatory Visit (INDEPENDENT_AMBULATORY_CARE_PROVIDER_SITE_OTHER): Payer: Self-pay | Admitting: Internal Medicine

## 2016-12-30 ENCOUNTER — Other Ambulatory Visit: Payer: Self-pay | Admitting: Family Medicine

## 2017-01-07 ENCOUNTER — Emergency Department (HOSPITAL_COMMUNITY)
Admission: EM | Admit: 2017-01-07 | Discharge: 2017-01-08 | Disposition: A | Discharge status: Home / Self Care | Payer: Medicare Other | Attending: Emergency Medicine | Admitting: Emergency Medicine

## 2017-01-07 ENCOUNTER — Encounter (HOSPITAL_COMMUNITY): Payer: Self-pay | Admitting: *Deleted

## 2017-01-07 ENCOUNTER — Emergency Department (HOSPITAL_COMMUNITY): Payer: Medicare Other

## 2017-01-07 DIAGNOSIS — R51 Headache: Secondary | ICD-10-CM | POA: Diagnosis not present

## 2017-01-07 DIAGNOSIS — J449 Chronic obstructive pulmonary disease, unspecified: Secondary | ICD-10-CM | POA: Diagnosis not present

## 2017-01-07 DIAGNOSIS — J40 Bronchitis, not specified as acute or chronic: Secondary | ICD-10-CM | POA: Insufficient documentation

## 2017-01-07 DIAGNOSIS — R05 Cough: Secondary | ICD-10-CM | POA: Diagnosis not present

## 2017-01-07 DIAGNOSIS — E119 Type 2 diabetes mellitus without complications: Secondary | ICD-10-CM | POA: Diagnosis not present

## 2017-01-07 DIAGNOSIS — Z79899 Other long term (current) drug therapy: Secondary | ICD-10-CM | POA: Diagnosis not present

## 2017-01-07 DIAGNOSIS — I10 Essential (primary) hypertension: Secondary | ICD-10-CM | POA: Diagnosis not present

## 2017-01-07 DIAGNOSIS — R519 Headache, unspecified: Secondary | ICD-10-CM

## 2017-01-07 DIAGNOSIS — R0602 Shortness of breath: Secondary | ICD-10-CM | POA: Diagnosis not present

## 2017-01-07 LAB — BASIC METABOLIC PANEL
Anion gap: 9 (ref 5–15)
BUN: 10 mg/dL (ref 6–20)
CHLORIDE: 100 mmol/L — AB (ref 101–111)
CO2: 30 mmol/L (ref 22–32)
Calcium: 9.2 mg/dL (ref 8.9–10.3)
Creatinine, Ser: 0.8 mg/dL (ref 0.44–1.00)
GFR calc Af Amer: >60 mL/min (ref >60)
GFR calc non Af Amer: >60 mL/min (ref >60)
Glucose, Bld: 123 mg/dL — ABNORMAL HIGH (ref 65–99)
Potassium: 3.4 mmol/L — ABNORMAL LOW (ref 3.5–5.1)
SODIUM: 139 mmol/L (ref 135–145)

## 2017-01-07 LAB — CBC WITH DIFFERENTIAL/PLATELET
Basophils Absolute: 0.1 10*3/uL (ref 0.0–0.1)
Basophils Relative: 0 %
Eosinophils Absolute: 0.3 10*3/uL (ref 0.0–0.7)
Eosinophils Relative: 2 %
HEMATOCRIT: 41.1 % (ref 36.0–46.0)
HEMOGLOBIN: 13.3 g/dL (ref 12.0–15.0)
LYMPHS ABS: 4.5 10*3/uL — AB (ref 0.7–4.0)
LYMPHS PCT: 29 %
MCH: 31.7 pg (ref 26.0–34.0)
MCHC: 32.4 g/dL (ref 30.0–36.0)
MCV: 97.9 fL (ref 78.0–100.0)
MONOS PCT: 8 %
Monocytes Absolute: 1.3 10*3/uL — ABNORMAL HIGH (ref 0.1–1.0)
NEUTROS ABS: 9.5 10*3/uL — AB (ref 1.7–7.7)
NEUTROS PCT: 61 %
Platelets: 361 10*3/uL (ref 150–400)
RBC: 4.2 MIL/uL (ref 3.87–5.11)
RDW: 14.7 % (ref 11.5–15.5)
WBC: 15.5 10*3/uL — AB (ref 4.0–10.5)

## 2017-01-07 MED ORDER — DOXYCYCLINE HYCLATE 100 MG PO CAPS: 100.0000 mg | ORAL_CAPSULE | Freq: Two times a day (BID) | ORAL | 0 refills | Status: DC

## 2017-01-07 MED ORDER — HYDROCOD POLST-CPM POLST ER 10-8 MG/5ML PO SUER: 5.0000 mL | Freq: Two times a day (BID) | ORAL | 0 refills | Status: DC | PRN

## 2017-01-07 MED ORDER — HYDROCODONE-ACETAMINOPHEN 5-325 MG PO TABS
1.0000 | ORAL_TABLET | Freq: Once | ORAL | Status: AC
  Administered 2017-01-07: 1 via ORAL
  Filled 2017-01-07: qty 1

## 2017-01-07 NOTE — ED Triage Notes (Signed)
Pt c/o severe headache x 3 days; pt states she has had some nasal congestion and a productive cough with yellow sputum

## 2017-01-07 NOTE — Discharge Instructions (Signed)
Follow up with your md this week. °

## 2017-01-09 ENCOUNTER — Encounter: Payer: Self-pay | Admitting: Family Medicine

## 2017-01-09 ENCOUNTER — Ambulatory Visit (INDEPENDENT_AMBULATORY_CARE_PROVIDER_SITE_OTHER): Payer: Medicare Other | Admitting: Family Medicine

## 2017-01-09 VITALS — BP 168/82 | Ht 59.0 in | Wt 73.0 lb

## 2017-01-09 DIAGNOSIS — J440 Chronic obstructive pulmonary disease with acute lower respiratory infection: Secondary | ICD-10-CM

## 2017-01-09 DIAGNOSIS — J209 Acute bronchitis, unspecified: Secondary | ICD-10-CM

## 2017-01-09 NOTE — Progress Notes (Addendum)
   Subjective:    Patient ID: [name], female    DOB: 03-24-1929, 83 y.o.   MRN: [id number]  Hypertension  This is a new problem.  BP today at home 173/81. Had headache yesterday.  Coughing. Started 2 weeks ago. Taking doxy.  She went to the ER did have a new area in the left lung denies any high fevers denies chills relate some chest congestion with discolored phlegm denies any headaches recently Patient is currently on oxygen. Oxygen medically indicated In stage COPD  Review of Systems Positive for cachexia but states she has a good appetite denies fever chills sweats denies vomiting diarrhea    Objective:   Physical Exam  Lungs are clear hearts regular HEENT benign      Assessment & Plan:  Bronchitis with significant underlying chronic lung disease-continue antibiotics if discolored phlegm not cleared up by Monday call us we'll send in another round  Cachexia related to chronic health issues patient has guarded future because of multiple health issues recommend follow-up in 4 weeks  Blood pressure doing well on several checks here in the office no further interventions recheck in one month sooner problems  End stage COPD, oxygen being used, medically indicated, continue oxygen.

## 2017-01-10 NOTE — ED Provider Notes (Signed)
[facility] DEPT Provider Note   CSN: [id number] Arrival date & time: 01/07/17  1837     History   Chief Complaint Chief Complaint  Patient presents with  . Headache    HPI [name] is a 83 y.o. female.  Patient is a history of severe lung disease. She had a cough with yellow sputum production for a few days along with headache.   The history is provided by the patient. No language interpreter was used.  Cough  This is a new problem. The current episode started more than 2 days ago. The problem occurs constantly. The problem has not changed since onset.The cough is productive of sputum. There has been no fever. Pertinent negatives include no chest pain and no headaches. She has tried nothing for the symptoms.    Past Medical History:  Diagnosis Date  . Anemia   . 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 Chronic abdominal pain   . Chronic neck and back pain   . COPD (chronic obstructive pulmonary disease) ([place])   . GAD (generalized anxiety disorder)   . Gastroesophageal reflux disease   . Gastroparesis   . IBS (irritable bowel syndrome)   . Lung mass   . Migraines   . Osteoporosis   . 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 Renal insufficiency   . S/P colostomy ([place])   . Tachycardia     Patient Active Problem List   Diagnosis Date Noted  . Depression with anxiety 06/10/2016  . Pubic ramus fracture, left, closed, initial encounter ([place]) 06/10/2016  . Pubic ramus fracture, right, closed, initial encounter ([place]) 06/10/2016  . COPD (chronic obstructive pulmonary disease) ([place]) 06/10/2016  . Hypertension 06/10/2016  . Diabetes mellitus ([place]) 06/10/2016  . Hyponatremia 06/10/2016  . Leukocytosis 06/10/2016  . Fall at home 06/10/2016  . Pelvic fracture ([place]) 06/10/2016  . Closed bilateral fracture of pubic rami ([place])   . Fall   . Cachexia ([place]) 04/23/2016  . Frailty 01/23/2016  . Abdominal pain 01/01/2016  . Acute respiratory failure with hypoxia ([place]) 12/20/2015  .  Dyspnea 07/25/2015  . Adult failure to thrive 05/30/2015  . 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 Hemoptysis 01/15/2015  . Pernicious anemia 09/13/2014  . Prediabetes 12/17/2013  . Hypothyroidism 10/04/2013  . Muscle cramps 05/03/2013  . Lung mass 03/17/2013  . Chronic headaches 11/11/2012  . Weight loss 09/21/2012  . Diarrhea 05/11/2012  . Gastroparesis 11/26/2011  . Flatulence 11/26/2011  . Osteoporosis 11/26/2011  . Ileostomy in place [facility]) 11/26/2011  . SCIATICA 08/24/2007    Past Surgical History:  Procedure Laterality Date  . ABDOMINAL HYSTERECTOMY    . APPENDECTOMY    . CHOLECYSTECTOMY    . ESOPHAGOGASTRODUODENOSCOPY    . ESOPHAGOGASTRODUODENOSCOPY N/A 08/25/2015   Procedure: ESOPHAGOGASTRODUODENOSCOPY (EGD);  Surgeon: Rogene Houston, MD;  Location: [facility] ENDO SUITE;  Service: Endoscopy;  Laterality: N/A;  730  . ILEOSCOPY N/A 08/25/2015   Procedure: ILEOSCOPY THROUGH STOMA;  Surgeon: Rogene Houston, MD;  Location: [facility] ENDO SUITE;  Service: Endoscopy;  Laterality: N/A;  . PARTIAL GASTRECTOMY  1967   3/4 gastric resection for bleeding ulcers  . TONSILLECTOMY      OB History    Gravida Para Term Preterm AB Living             3   SAB TAB Ectopic Multiple Live Births                   Home Medications    Prior  to Admission medications   Medication Sig Start Date End Date Taking? Authorizing Provider  acetaminophen (TYLENOL) 325 MG tablet Take 2 tablets (650 mg total) by mouth every 6 (six) hours as needed for mild pain or headache (or Fever >/= 101). 12/22/15  Yes Samuella Cota, MD  citalopram (CELEXA) 10 MG tablet TAKE (1) TABLET BY MOUTH ONCE DAILY. 07/01/16  Yes Kathyrn Drown, MD  diazepam (VALIUM) 2 MG tablet Take 1 tablets twice a daily as needed. Take sparingly (Prescription must last 30 days) Patient taking differently: Take 2 mg by mouth 2 (two) times daily. Take sparingly (Prescription must last 30 days) 11/25/16  Yes Luking, Elayne Snare, MD  Doxylamine-DM  (ROBITUSSIN NIGHTTIME COUGH DM) 12.5-30 MG/10ML LIQD Take 5 mLs by mouth daily as needed (for cough). Patient taked 1 teaspoon as needed.    Yes [provider]  geriatric multivitamins-minerals (ELDERTONIC/GEVRABON) ELIX Take 15 mLs by mouth daily. 12/11/16  Yes Rehman, Mechele Dawley, MD  ipratropium (ATROVENT HFA) 17 MCG/ACT inhaler 2 PUFFS FOUR TIMES DAILY. Patient taking differently: Inhale 2 puffs into the lungs 4 (four) times daily.  09/20/16  Yes Luking, Elayne Snare, MD  levothyroxine (SYNTHROID, LEVOTHROID) 25 MCG tablet TAKE 2 TABLETS BY MOUTH ON MONDAY AND FRIDAY. & 1 TABLET ALL OTHER DAYS 12/04/16  Yes Luking, Elayne Snare, MD  lipase/protease/amylase (CREON) 12000 units CPEP capsule Take 2 capsules (24,000 Units total) by mouth 3 (three) times daily with meals. Patient taking differently: Take 12,000-24,000 Units by mouth 3 (three) times daily with meals. 2 with meals and 1 with snacks 03/12/16  Yes Rehman, Mechele Dawley, MD  Melatonin 5 MG CAPS Take 5 mg by mouth at bedtime.   Yes [provider]  memantine (NAMENDA) 10 MG tablet TAKE (1) TABLET TWICE DAILY. Patient taking differently: Take 10 mg by mouth 2 (two) times daily. TAKE (1) TABLET TWICE DAILY 09/20/16  Yes Kathyrn Drown, MD  metoprolol tartrate (LOPRESSOR) 25 MG tablet TAKE 1/2 TABLET TWICE DAILY FOR HIGH BLOOD PRESSURE. 07/01/16  Yes Luking, Scott A, MD  nystatin cream (MYCOSTATIN) Apply 1 application topically daily as needed for dry skin.  11/29/16  Yes [provider]  potassium chloride (K-DUR) 10 MEQ tablet TAKE (1) TABLET BY MOUTH ONCE DAILY. 07/01/16  Yes Luking, Elayne Snare, MD  Simethicone (GAS RELIEF PO) Take 1 tablet by mouth daily as needed (for gas relief).    Yes [provider]  topiramate (TOPAMAX) 25 MG tablet Take 25 mg by mouth daily. 01/03/17  Yes [provider]  venlafaxine XR (EFFEXOR-XR) 75 MG 24 hr capsule TAKE 1 CAPSULE ONCE DAILY WITH BREAKFAST. 12/30/16  Yes Kathyrn Drown, MD    chlorpheniramine-HYDROcodone (TUSSIONEX PENNKINETIC ER) 10-8 MG/5ML SUER Take 5 mLs by mouth every 12 (twelve) hours as needed for cough. 01/07/17   Milton Ferguson, MD  doxycycline (VIBRAMYCIN) 100 MG capsule Take 1 capsule (100 mg total) by mouth 2 (two) times daily. One po bid x 7 days 01/07/17   Milton Ferguson, MD  loperamide (IMODIUM) 2 MG capsule Take 1 capsule (2 mg total) by mouth 3 (three) times daily before meals. Patient taking differently: Take 2 mg by mouth as needed for diarrhea or loose stools.  09/17/16   Rogene Houston, MD  venlafaxine XR (EFFEXOR-XR) 37.5 MG 24 hr capsule TAKE 1 CAPSULE ONCE DAILY WITH BREAKFAST. 12/06/16   Kathyrn Drown, MD    Family History Family History  Problem Relation Age of Onset  .  Stroke Mother   . Stroke Father   . Diabetes Sister   . Diabetes Brother   . Stroke Brother   . Cancer Sister        unknown type  . Diabetes Sister   . Emphysema Sister   . Diabetes Brother   . Stroke Brother   . Diabetes Son   . Irritable bowel syndrome Son   . Diabetes Son   . Hypertension Son     Social History Social History  Substance Use Topics  . Smoking status: Never Smoker  . Smokeless tobacco: Never Used  . Alcohol use No     Allergies   Levaquin [levofloxacin]; Aspirin; Tetanus toxoid; Zolpidem tartrate; and Fosamax [alendronate sodium]   Review of Systems Review of Systems  Constitutional: Negative for appetite change and fatigue.  HENT: Negative for congestion, ear discharge and sinus pressure.   Eyes: Negative for discharge.  Respiratory: Positive for cough.   Cardiovascular: Negative for chest pain.  Gastrointestinal: Negative for abdominal pain and diarrhea.  Genitourinary: Negative for frequency and hematuria.  Musculoskeletal: Negative for back pain.  Skin: Negative for rash.  Neurological: Negative for seizures and headaches.  Psychiatric/Behavioral: Negative for hallucinations.     Physical Exam Updated Vital Signs BP  (!) 185/88 (BP Location: Left Leg)   Pulse 87   Temp 99.7 F (37.6 C) (Oral)   Resp 17   Ht 4' (1.219 m)   Wt 33.6 kg (74 lb)   SpO2 92%   BMI 22.58 kg/m   Physical Exam  Constitutional: She is oriented to person, place, and time. She appears well-developed.  HENT:  Head: Normocephalic.  Eyes: Conjunctivae and EOM are normal. No scleral icterus.  Neck: Neck supple. No thyromegaly present.  Cardiovascular: Normal rate and regular rhythm.  Exam reveals no gallop and no friction rub.   No murmur heard. Pulmonary/Chest: No stridor. She has no wheezes. She has no rales. She exhibits no tenderness.  Abdominal: She exhibits no distension. There is no tenderness. There is no rebound.  Musculoskeletal: Normal range of motion. She exhibits no edema.  Lymphadenopathy:    She has no cervical adenopathy.  Neurological: She is oriented to person, place, and time. She exhibits normal muscle tone. Coordination normal.  Skin: No rash noted. No erythema.  Psychiatric: She has a normal mood and affect. Her behavior is normal.     ED Treatments / Results  Labs (all labs ordered are listed, but only abnormal results are displayed) Labs Reviewed  CBC WITH DIFFERENTIAL/PLATELET - Abnormal; Notable for the following:       Result Value   WBC 15.5 (*)    Neutro Abs 9.5 (*)    Lymphs Abs 4.5 (*)    Monocytes Absolute 1.3 (*)    All other components within normal limits  BASIC METABOLIC PANEL - Abnormal; Notable for the following:    Potassium 3.4 (*)    Chloride 100 (*)    Glucose, Bld 123 (*)    All other components within normal limits    EKG  EKG Interpretation None       Radiology No results found.  Procedures Procedures (including critical care time)  Medications Ordered in ED Medications  HYDROcodone-acetaminophen (NORCO/VICODIN) 5-325 MG per tablet 1 tablet (1 tablet Oral Given 01/07/17 2313)     Initial Impression / Assessment and Plan / ED Course  I have reviewed the  triage vital signs and the nursing notes.  Pertinent labs & imaging results that  were available during my care of the patient were reviewed by me and considered in my medical decision making (see chart for details).     Chest x-ray shows no pneumonia but severe lung disease. Patient will be placed on antibiotics and follow-up with her PCP  Final Clinical Impressions(s) / ED Diagnoses   Final diagnoses:  Bad headache  Bronchitis    New Prescriptions Discharge Medication List as of 01/07/2017 11:45 PM    START taking these medications   Details  doxycycline (VIBRAMYCIN) 100 MG capsule Take 1 capsule (100 mg total) by mouth 2 (two) times daily. One po bid x 7 days, Starting Tue 01/07/2017, Print         Milton Ferguson, MD 01/10/17 1025

## 2017-01-14 ENCOUNTER — Telehealth: Payer: Self-pay | Admitting: Family Medicine

## 2017-01-14 MED ORDER — CEFDINIR 300 MG PO CAPS: 300.0000 mg | ORAL_CAPSULE | Freq: Two times a day (BID) | ORAL | 0 refills | Status: DC

## 2017-01-14 NOTE — Telephone Encounter (Signed)
Patient not having fevers or SOB- when she coughs it is discolored at times -not clear

## 2017-01-14 NOTE — Telephone Encounter (Signed)
Patient is coughing up colored phlegm.  Dr. Nicki Reaper told patient to call in if this occurred and we would call something in.  [place]

## 2017-01-14 NOTE — Telephone Encounter (Signed)
Prescription sent electronically to pharmacy. Patient notified. 

## 2017-01-14 NOTE — Telephone Encounter (Signed)
Omnicef 300 mg 1 twice a day for 7 days follow-up if ongoing troubles or if worse

## 2017-01-20 ENCOUNTER — Other Ambulatory Visit: Payer: Self-pay | Admitting: Family Medicine

## 2017-01-20 NOTE — Telephone Encounter (Signed)
Last seen 01/09/17 

## 2017-01-20 NOTE — Telephone Encounter (Signed)
[name]

## 2017-01-20 NOTE — Telephone Encounter (Signed)
Approve all 6 refills

## 2017-02-03 ENCOUNTER — Other Ambulatory Visit: Payer: Self-pay

## 2017-02-04 ENCOUNTER — Ambulatory Visit (INDEPENDENT_AMBULATORY_CARE_PROVIDER_SITE_OTHER): Payer: Medicare Other | Admitting: Internal Medicine

## 2017-02-07 ENCOUNTER — Other Ambulatory Visit (INDEPENDENT_AMBULATORY_CARE_PROVIDER_SITE_OTHER): Payer: Self-pay | Admitting: Internal Medicine

## 2017-02-07 ENCOUNTER — Ambulatory Visit: Payer: Medicare Other | Admitting: Family Medicine

## 2017-02-09 ENCOUNTER — Telehealth: Payer: Self-pay | Admitting: Family Medicine

## 2017-02-09 NOTE — Telephone Encounter (Signed)
[facility] stated that per Medicare guideline with this patient being on oxygen that oxygen has to be listed on her medicine list. I'm not sure how that is done. See if you can list oxygen 2 L per nasal cannula on her medication list-good luck, once this is completed please forward this information to the front so they can send her office visit note from June 28 2 [facility] thank you

## 2017-02-10 NOTE — Telephone Encounter (Signed)
This information was faxed.

## 2017-02-10 NOTE — Telephone Encounter (Signed)
Oxygen added to med list

## 2017-02-19 ENCOUNTER — Other Ambulatory Visit: Payer: Self-pay | Admitting: Family Medicine

## 2017-03-06 ENCOUNTER — Encounter: Payer: Self-pay | Admitting: Family Medicine

## 2017-03-06 ENCOUNTER — Ambulatory Visit (INDEPENDENT_AMBULATORY_CARE_PROVIDER_SITE_OTHER): Payer: Medicare Other | Admitting: Family Medicine

## 2017-03-06 VITALS — BP 114/70 | Ht 59.0 in | Wt 76.0 lb

## 2017-03-06 DIAGNOSIS — R002 Palpitations: Secondary | ICD-10-CM

## 2017-03-06 MED ORDER — HYDROCOD POLST-CPM POLST ER 10-8 MG/5ML PO SUER: 5.0000 mL | Freq: Two times a day (BID) | ORAL | 0 refills | Status: AC | PRN

## 2017-03-06 MED ORDER — DIAZEPAM 2 MG PO TABS: ORAL_TABLET | ORAL | 5 refills | Status: AC

## 2017-03-06 NOTE — Progress Notes (Signed)
   Subjective:    Patient ID: Tenna Child, female    DOB: 1929/04/09, 83 y.o.   MRN: [id number]  Palpitations   The current episode started more than 1 month ago. The problem occurs daily. The problem has been unchanged. Associated symptoms include coughing and an irregular heartbeat. Pertinent negatives include no chest pain, fever or shortness of breath.   Patient states she notices this fluttering when she is up and active, not when she is still. No sob,No chest pain, No pain down either arm. States this started up past few weeks she notices it more when she gets up and walks around she does not get it when she is resting she denies any chest pressure tightness shortness of breath sweats  Review of Systems  Constitutional: Negative for activity change and fever.  HENT: Positive for congestion and rhinorrhea. Negative for ear pain.   Eyes: Negative for discharge.  Respiratory: Positive for cough. Negative for shortness of breath and wheezing.   Cardiovascular: Positive for palpitations. Negative for chest pain.       Objective:   Physical Exam  Constitutional: She appears well-developed.  HENT:  Head: Normocephalic.  Right Ear: External ear normal.  Left Ear: External ear normal.  Nose: Nose normal.  Mouth/Throat: Oropharynx is clear and moist. No oropharyngeal exudate.  Eyes: Right eye exhibits no discharge. Left eye exhibits no discharge.  Neck: Neck supple. No tracheal deviation present.  Cardiovascular: Normal rate and normal heart sounds.   No murmur heard. Pulmonary/Chest: Effort normal and breath sounds normal. She has no wheezes. She has no rales.  Lymphadenopathy:    She has no cervical adenopathy.  Skin: Skin is warm and dry.  Nursing note and vitals reviewed.   Her EKG looks normal it was compared to the previous EKG no arrhythmias noted      Assessment & Plan:  Possible tachycardia associated with activity probably related to deconditioning she has gained 3  pounds we talked about if she starts having these spells at rest we will need to get her in with cardiology she will keep [place] updated she will follow-up again in a few months she will follow-up sooner problems

## 2017-03-07 ENCOUNTER — Ambulatory Visit: Payer: Medicare Other | Admitting: Family Medicine

## 2017-03-11 ENCOUNTER — Encounter: Payer: Self-pay | Admitting: Family Medicine

## 2017-03-14 ENCOUNTER — Telehealth: Payer: Self-pay | Admitting: Family Medicine

## 2017-03-14 MED ORDER — AMOXICILLIN 500 MG PO CAPS: 500.0000 mg | ORAL_CAPSULE | Freq: Three times a day (TID) | ORAL | 0 refills | Status: DC

## 2017-03-14 NOTE — Telephone Encounter (Signed)
Here on 03-06-17 and saw Dr. Nicki Reaper. He gave her some cough meds and told her to call back if cough got worse. At the time nothing coming up with the cough but now coughing up yellow/green junk.  Wanted to get an antibiotic called in to [facility].

## 2017-03-14 NOTE — Telephone Encounter (Signed)
Spoke with patient and informed her per Dr.Stever Wolfgang [place]- we are sending in medication to pharmacy. Patient verbalized understanding.

## 2017-03-14 NOTE — Telephone Encounter (Signed)
amox 500 tid ten d

## 2017-03-31 ENCOUNTER — Telehealth (INDEPENDENT_AMBULATORY_CARE_PROVIDER_SITE_OTHER): Payer: Self-pay | Admitting: Internal Medicine

## 2017-03-31 NOTE — Telephone Encounter (Signed)
Patient's niece [name] presented to the office today requesting to speak to [place].  She is wanting samples of Creon.    [phone]

## 2017-03-31 NOTE — Telephone Encounter (Signed)
We have no samples of the 24000 units of Creon. I spoke with Nile Dear and she states that they will go through the Pharmacy to get them. If we get samples of the 24000 units I will call [facility].

## 2017-04-16 ENCOUNTER — Other Ambulatory Visit (INDEPENDENT_AMBULATORY_CARE_PROVIDER_SITE_OTHER): Payer: Self-pay | Admitting: Internal Medicine

## 2017-04-22 ENCOUNTER — Ambulatory Visit (INDEPENDENT_AMBULATORY_CARE_PROVIDER_SITE_OTHER): Payer: Medicare Other | Admitting: Internal Medicine

## 2017-04-22 ENCOUNTER — Emergency Department (HOSPITAL_COMMUNITY)
Admission: EM | Admit: 2017-04-22 | Discharge: 2017-04-22 | Disposition: A | Discharge status: Home / Self Care | Payer: Medicare Other | Attending: Emergency Medicine | Admitting: Emergency Medicine

## 2017-04-22 ENCOUNTER — Ambulatory Visit: Payer: Medicare Other | Admitting: Family Medicine

## 2017-04-22 ENCOUNTER — Emergency Department (HOSPITAL_COMMUNITY): Payer: Medicare Other

## 2017-04-22 ENCOUNTER — Telehealth (INDEPENDENT_AMBULATORY_CARE_PROVIDER_SITE_OTHER): Payer: Self-pay | Admitting: Internal Medicine

## 2017-04-22 ENCOUNTER — Encounter (HOSPITAL_COMMUNITY): Payer: Self-pay

## 2017-04-22 DIAGNOSIS — Z79899 Other long term (current) drug therapy: Secondary | ICD-10-CM | POA: Insufficient documentation

## 2017-04-22 DIAGNOSIS — J9621 Acute and chronic respiratory failure with hypoxia: Secondary | ICD-10-CM

## 2017-04-22 DIAGNOSIS — E119 Type 2 diabetes mellitus without complications: Secondary | ICD-10-CM | POA: Insufficient documentation

## 2017-04-22 DIAGNOSIS — J449 Chronic obstructive pulmonary disease, unspecified: Secondary | ICD-10-CM | POA: Insufficient documentation

## 2017-04-22 DIAGNOSIS — E039 Hypothyroidism, unspecified: Secondary | ICD-10-CM | POA: Diagnosis not present

## 2017-04-22 DIAGNOSIS — H538 Other visual disturbances: Secondary | ICD-10-CM | POA: Insufficient documentation

## 2017-04-22 DIAGNOSIS — R410 Disorientation, unspecified: Secondary | ICD-10-CM | POA: Diagnosis not present

## 2017-04-22 DIAGNOSIS — I6789 Other cerebrovascular disease: Secondary | ICD-10-CM | POA: Diagnosis not present

## 2017-04-22 DIAGNOSIS — R0902 Hypoxemia: Secondary | ICD-10-CM | POA: Diagnosis present

## 2017-04-22 DIAGNOSIS — Z932 Ileostomy status: Secondary | ICD-10-CM | POA: Insufficient documentation

## 2017-04-22 LAB — BASIC METABOLIC PANEL
Anion gap: 9 (ref 5–15)
BUN: 15 mg/dL (ref 6–20)
CHLORIDE: 97 mmol/L — AB (ref 101–111)
CO2: 31 mmol/L (ref 22–32)
CREATININE: 0.85 mg/dL (ref 0.44–1.00)
Calcium: 9 mg/dL (ref 8.9–10.3)
GFR calc Af Amer: >60 mL/min (ref >60)
GFR calc non Af Amer: >60 mL/min (ref >60)
GLUCOSE: 105 mg/dL — AB (ref 65–99)
POTASSIUM: 4.5 mmol/L (ref 3.5–5.1)
Sodium: 137 mmol/L (ref 135–145)

## 2017-04-22 LAB — CBC WITH DIFFERENTIAL/PLATELET
Basophils Absolute: 0 10*3/uL (ref 0.0–0.1)
Basophils Relative: 0 %
EOS PCT: 2 %
Eosinophils Absolute: 0.3 10*3/uL (ref 0.0–0.7)
HCT: 39.5 % (ref 36.0–46.0)
Hemoglobin: 12.5 g/dL (ref 12.0–15.0)
LYMPHS ABS: 3.4 10*3/uL (ref 0.7–4.0)
LYMPHS PCT: 29 %
MCH: 31.3 pg (ref 26.0–34.0)
MCHC: 31.6 g/dL (ref 30.0–36.0)
MCV: 99 fL (ref 78.0–100.0)
MONO ABS: 0.9 10*3/uL (ref 0.1–1.0)
MONOS PCT: 8 %
Neutro Abs: 6.9 10*3/uL (ref 1.7–7.7)
Neutrophils Relative %: 61 %
PLATELETS: 308 10*3/uL (ref 150–400)
RBC: 3.99 MIL/uL (ref 3.87–5.11)
RDW: 14.4 % (ref 11.5–15.5)
WBC: 11.5 10*3/uL — ABNORMAL HIGH (ref 4.0–10.5)

## 2017-04-22 NOTE — Telephone Encounter (Signed)
Patient's niece [name] presented to the office and stated that she needed to cancel today's appointment and reschedule.  She rescheduled her for February.  She stated that GI related, she's doing good.  She is taking her to the ER for a non related GI issue.  She is out of her Creon and would like samples if we have any, if not, please call to [facility].  [phone]

## 2017-04-22 NOTE — Care Management (Signed)
CM asked to see pt to determine oxgyen capability at home. Pt has home oxygen through [facility]. She is set up for continuous oxygen but has only been using it at night and PRN. Pt has portable tanks at home. [facility] is bringing port tank to hospital for transport home.

## 2017-04-22 NOTE — Discharge Instructions (Signed)
Your oxygen dependence has increase over the last 2 weeks. This could be due to worsening of her COPD or it could be because of other conditions. After discussing the findings with you, you have elected to have your primary doctor further assess you on this matter. Ensure you are using oxygen throughout the day. Please let home health know about your new oxygen requirement  Please return to the ER if your symptoms worsen; you have increased pain, fevers, chills, inability to keep any medications down, confusion.

## 2017-04-22 NOTE — ED Provider Notes (Signed)
[facility] DEPT Provider Note   CSN: [id number] Arrival date & time: 04/22/17  1142     History   Chief Complaint Chief Complaint  Patient presents with  . Shortness of Breath    HPI [name] is a 83 y.o. female.  HPI Patient with history of anemia, COPD comes in with chief complaint of hypoxia, disorientation. Patient has history of COPD and is requiring 2 L of oxygen at nighttime. Family has noted that over the last 2 weeks patient's oxygen saturation drops even during the daytime especially when patient is trying to get to the bathroom. This morning patient was brushing her teeth and must have taken her oxygen off and she was found to be disoriented and with blurry vision. Patient's oxygen saturation at that time was in the 70s. Oxygen was restarted and over time her mental status and vision improved, and now they're back to baseline. Patient denies any new cough, new phlegm, chest pains, orthopnea like symptoms. She denies any knee or leg swelling. At baseline patient is not very ambulatory, however she does go to the kitchen or bathroom on her own power. Patient has no history of PE, DVT and no current high risk features for those conditions. Patient denies any blood loss. Patient resides at home and has good home support.  Past Medical History:  Diagnosis Date  . Anemia   . 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 Chronic abdominal pain   . Chronic neck and back pain   . COPD (chronic obstructive pulmonary disease) ([place])   . GAD (generalized anxiety disorder)   . Gastroesophageal reflux disease   . Gastroparesis   . IBS (irritable bowel syndrome)   . Lung mass   . Migraines   . Osteoporosis   . 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 Renal insufficiency   . S/P colostomy ([place])   . Tachycardia     Patient Active Problem List   Diagnosis Date Noted  . Depression with anxiety 06/10/2016  . Pubic ramus fracture, left, closed, initial encounter ([place]) 06/10/2016  . Pubic ramus  fracture, right, closed, initial encounter ([place]) 06/10/2016  . COPD (chronic obstructive pulmonary disease) ([place]) 06/10/2016  . Hypertension 06/10/2016  . Diabetes mellitus ([place]) 06/10/2016  . Hyponatremia 06/10/2016  . Leukocytosis 06/10/2016  . Fall at home 06/10/2016  . Pelvic fracture ([place] [place] [place]) 06/10/2016  . Closed bilateral fracture of pubic rami ([place])   . Fall   . Cachexia ([place]) 04/23/2016  . Frailty 01/23/2016  . Abdominal pain 01/01/2016  . Acute respiratory failure with hypoxia ([place]) 12/20/2015  . Dyspnea 07/25/2015  . Adult failure to thrive 05/30/2015  . 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 Hemoptysis 01/15/2015  . Pernicious anemia 09/13/2014  . Prediabetes 12/17/2013  . Hypothyroidism 10/04/2013  . Muscle cramps 05/03/2013  . Lung mass 03/17/2013  . Chronic headaches 11/11/2012  . Weight loss 09/21/2012  . Diarrhea 05/11/2012  . Gastroparesis 11/26/2011  . Flatulence 11/26/2011  . Osteoporosis 11/26/2011  . Ileostomy in place [facility]) 11/26/2011  . SCIATICA 08/24/2007    Past Surgical History:  Procedure Laterality Date  . ABDOMINAL HYSTERECTOMY    . APPENDECTOMY    . CHOLECYSTECTOMY    . COLOSTOMY    . ESOPHAGOGASTRODUODENOSCOPY    . ESOPHAGOGASTRODUODENOSCOPY N/A 08/25/2015   Procedure: ESOPHAGOGASTRODUODENOSCOPY (EGD);  Surgeon: Rogene Houston, MD;  Location: [facility] ENDO SUITE;  Service: Endoscopy;  Laterality: N/A;  730  . ILEOSCOPY N/A 08/25/2015   Procedure: ILEOSCOPY THROUGH STOMA;  Surgeon: Bernadene Person  Gloriann Loan, MD;  Location: [facility] ENDO SUITE;  Service: Endoscopy;  Laterality: N/A;  . PARTIAL GASTRECTOMY  1967   3/4 gastric resection for bleeding ulcers  . TONSILLECTOMY      OB History    Gravida Para Term Preterm AB Living             3   SAB TAB Ectopic Multiple Live Births                   Home Medications    Prior to Admission medications   Medication Sig Start Date End Date Taking? Authorizing Provider  acetaminophen (TYLENOL) 325 MG tablet Take 2  tablets (650 mg total) by mouth every 6 (six) hours as needed for mild pain or headache (or Fever >/= 101). 12/22/15  Yes Samuella Cota, MD  chlorpheniramine-HYDROcodone [facility] PENNKINETIC ER) 10-8 MG/5ML SUER Take 5 mLs by mouth every 12 (twelve) hours as needed for cough. 03/06/17  Yes Luking, Elayne Snare, MD  citalopram (CELEXA) 10 MG tablet TAKE (1) TABLET BY MOUTH ONCE DAILY. 01/20/17  Yes Luking, Scott A, MD  CREON 12000 units CPEP capsule TAKE 2 CAPSULES BY MOUTH 3 TIMES DAILY WITH MEALS. TAKE 1 CAPSULE TWICE DAILY WITH SNACKS 04/16/17  Yes Rehman, Mechele Dawley, MD  diazepam (VALIUM) 2 MG tablet Take 1 tablets twice a daily as needed. Take sparingly (Prescription must last 30 days) 03/06/17  Yes Luking, Elayne Snare, MD  Doxylamine-DM (ROBITUSSIN NIGHTTIME COUGH DM) 12.5-30 MG/10ML LIQD Take 5 mLs by mouth daily as needed (for cough). Patient taked 1 teaspoon as needed.    Yes [provider]  ipratropium (ATROVENT HFA) 17 MCG/ACT inhaler 2 PUFFS FOUR TIMES DAILY. Patient taking differently: Inhale 2 puffs into the lungs 4 (four) times daily.  09/20/16  Yes Luking, Elayne Snare, MD  levothyroxine (SYNTHROID, LEVOTHROID) 25 MCG tablet TAKE 2 TABLETS BY MOUTH ON MONDAY AND FRIDAY. & 1 TABLET ALL OTHER DAYS 12/04/16  Yes Luking, Scott A, MD  loperamide (IMODIUM) 2 MG capsule TAKE (1) CAPSULE THREE TIMES DAILY BEFORE MEALS. 02/07/17  Yes Rehman, Mechele Dawley, MD  Melatonin 5 MG CAPS Take 5 mg by mouth at bedtime.   Yes [provider]  memantine (NAMENDA) 10 MG tablet TAKE 1 TABLET BY MOUTH TWICE DAILY. 02/20/17  Yes Luking, Scott A, MD  metoprolol tartrate (LOPRESSOR) 25 MG tablet TAKE 1/2 TABLET TWICE DAILY FOR HIGH BLOOD PRESSURE. 01/20/17  Yes Luking, Scott A, MD  potassium chloride (K-DUR) 10 MEQ tablet TAKE (1) TABLET BY MOUTH ONCE DAILY. 01/20/17  Yes Luking, Scott A, MD  Simethicone (GAS RELIEF PO) Take 1 tablet by mouth daily as needed (for gas relief).    Yes [provider]  venlafaxine XR  (EFFEXOR-XR) 37.5 MG 24 hr capsule TAKE 1 CAPSULE ONCE DAILY WITH BREAKFAST. 12/06/16  Yes Luking, Elayne Snare, MD  amoxicillin (AMOXIL) 500 MG capsule Take 1 capsule (500 mg total) by mouth 3 (three) times daily. Patient not taking: Reported on 04/22/2017 03/14/17   Mikey Kirschner, MD  OXYGEN Inhale into the lungs. 2 liters -Nasal Canual    [provider]  venlafaxine XR (EFFEXOR-XR) 75 MG 24 hr capsule TAKE 1 CAPSULE ONCE DAILY WITH BREAKFAST. Patient not taking: Reported on 03/06/2017 12/30/16   Kathyrn Drown, MD    Family History Family History  Problem Relation Age of Onset  . Stroke Mother   . Stroke Father   . Diabetes Sister   . Diabetes  Brother   . Stroke Brother   . Cancer Sister        unknown type  . Diabetes Sister   . Emphysema Sister   . Diabetes Brother   . Stroke Brother   . Diabetes Son   . Irritable bowel syndrome Son   . Diabetes Son   . Hypertension Son     Social History Social History  Substance Use Topics  . Smoking status: Never Smoker  . Smokeless tobacco: Never Used  . Alcohol use No     Allergies   Levaquin [levofloxacin]; Aspirin; Tetanus toxoid; Zolpidem tartrate; and Fosamax [alendronate sodium]   Review of Systems Review of Systems  Constitutional: Positive for activity change.  Respiratory: Negative for cough and shortness of breath.   Cardiovascular: Negative for chest pain.  Allergic/Immunologic: Negative for immunocompromised state.  All other systems reviewed and are negative.    Physical Exam Updated Vital Signs BP 132/65 (BP Location: Left Arm)   Pulse (!) 55   Temp 98.7 F (37.1 C) (Oral)   Resp 20   Ht 4\' 11"  (1.499 m)   Wt 34.5 kg (76 lb)   SpO2 (!) 89%   BMI 15.35 kg/m   Physical Exam  Constitutional: She is oriented to person, place, and time. She appears well-developed.  HENT:  Head: Normocephalic and atraumatic.  Eyes: EOM are normal.  Neck: Normal range of motion. Neck supple.  Cardiovascular:  Normal rate and intact distal pulses.   Murmur heard. Pulmonary/Chest: Effort normal and breath sounds normal. No respiratory distress. She has no wheezes. She has no rales.  Abdominal: Bowel sounds are normal.  Musculoskeletal: She exhibits no edema or tenderness.  Neurological: She is alert and oriented to person, place, and time.  Skin: Skin is warm and dry.  Nursing note and vitals reviewed.    ED Treatments / Results  Labs (all labs ordered are listed, but only abnormal results are displayed) Labs Reviewed  BASIC METABOLIC PANEL - Abnormal; Notable for the following:       Result Value   Chloride 97 (*)    Glucose, Bld 105 (*)    All other components within normal limits  CBC WITH DIFFERENTIAL/PLATELET - Abnormal; Notable for the following:    WBC 11.5 (*)    All other components within normal limits    EKG  EKG Interpretation  Date/Time:  Tuesday April 22 2017 12:01:42 EDT Ventricular Rate:  54 PR Interval:    QRS Duration: 124 QT Interval:  502 QTC Calculation: 476 R Axis:   117 Text Interpretation:  Right and left arm electrode reversal, interpretation assumes no reversal Sinus rhythm Nonspecific intraventricular conduction delay Lateral infarct, old Probable anteroseptal infarct, recent Confirmed by Milton Ferguson [phone]) on 04/22/2017 12:04:35 PM       Radiology Dg Chest 2 View  Result Date: 04/22/2017 CLINICAL DATA:  Racing heart.  Disorientation. EXAM: CHEST  2 VIEW COMPARISON:  01/07/2017.  06/07/2016.  06/14/2015. FINDINGS: Mediastinum and hilar structures normal. Heart size stable. Normal pulmonary vascularity. Bilateral upper lobe chronic pleural-parenchymal thickening consistent scarring. Stable mild bilateral pleural thickening consistent with scarring. COPD. No pneumothorax . IMPRESSION: Stable bilateral pleural-parenchymal thickening consistent scarring. COPD. Electronically Signed   By: Marcello Moores  Register   On: 04/22/2017 13:06     Procedures Procedures (including critical care time)  Medications Ordered in ED Medications - No data to display   Initial Impression / Assessment and Plan / ED Course  I have reviewed the triage  vital signs and the nursing notes.  Pertinent labs & imaging results that were available during my care of the patient were reviewed by me and considered in my medical decision making (see chart for details).     Patient comes in with worsening hypoxic respiratory failure. Patient has history of COPD. It seems like she had an event earlier today where patient had disorientation/global amnesia and bilateral visual disturbance. The etiology of those symptoms likely was hypoxia, as patient's O2 sats were noted to be in the 70s and her symptoms resolved with oxygen. Currently patient is resting and has no respiratory distress. Exam reveals no signs of fluid overload. At no point did she have any chest pain and patient does not have any history of coronary artery disease. Physical exam not showing any signs of DVT and patient has no major risk factors for thrombosis.  My impression is that patient's new oxygen requirement is likely due to worsening of her COPD. We discussed my concerns of the new physician requirement being secondary to worsening COPD or a new condition like a PE with the patient and family. We discussed taking the next step in getting a CAT scan in the ER or waiting for a primary care doctor to be evaluated before the next step is taken. Patient and family are willing to wait to be seen by their own doctor. Case management to see patient in regards to her oxygen tank requirement. Strict return precautions to the ER have been discussed.  Final Clinical Impressions(s) / ED Diagnoses   Final diagnoses:  Acute on chronic respiratory failure with hypoxia [facility])    New Prescriptions New Prescriptions   No medications on file     Varney Biles, MD 04/22/17 1347

## 2017-04-22 NOTE — ED Triage Notes (Signed)
Per ems, pt wears O2 at night and reports for the past 2 weeks she has had to wear o2 continuously because she has had sob and 02 sat drops in the 70's.  This morning pt was getting ready for her doctor's appt and said took her oxygen off to brush her teeth and vision became  blurry and she was disoriented.  EMS says symptoms cleared after she put her oxygen back on.  Pt says feels like her heart beats fast with any exertion.  Pt c/o headache.

## 2017-04-23 ENCOUNTER — Other Ambulatory Visit (INDEPENDENT_AMBULATORY_CARE_PROVIDER_SITE_OTHER): Payer: Self-pay | Admitting: *Deleted

## 2017-04-23 MED ORDER — PANCRELIPASE (LIP-PROT-AMYL) 12000-38000 UNITS PO CPEP: ORAL_CAPSULE | ORAL | 11 refills | Status: AC

## 2017-04-23 NOTE — Telephone Encounter (Signed)
We have no samples.  A re has been sent to the patient's pharmacy.

## 2017-04-23 NOTE — Telephone Encounter (Signed)
No samples.  A rx was sent to the patient's pharmacy.

## 2017-04-29 ENCOUNTER — Ambulatory Visit (INDEPENDENT_AMBULATORY_CARE_PROVIDER_SITE_OTHER): Payer: Medicare Other | Admitting: Family Medicine

## 2017-04-29 ENCOUNTER — Encounter: Payer: Self-pay | Admitting: Family Medicine

## 2017-04-29 ENCOUNTER — Other Ambulatory Visit (HOSPITAL_COMMUNITY): Payer: Self-pay

## 2017-04-29 ENCOUNTER — Ambulatory Visit (HOSPITAL_COMMUNITY): Admission: RE | Admit: 2017-04-29 | Payer: Medicare Other | Source: Ambulatory Visit

## 2017-04-29 ENCOUNTER — Telehealth: Payer: Self-pay | Admitting: Family Medicine

## 2017-04-29 ENCOUNTER — Other Ambulatory Visit (HOSPITAL_COMMUNITY)
Admission: RE | Admit: 2017-04-29 | Discharge: 2017-04-29 | Disposition: A | Discharge status: Home / Self Care | Payer: Medicare Other | Source: Ambulatory Visit | Attending: Family Medicine | Admitting: Family Medicine

## 2017-04-29 VITALS — BP 126/72 | HR 69 | Ht 59.0 in | Wt 75.0 lb

## 2017-04-29 DIAGNOSIS — R059 Cough, unspecified: Secondary | ICD-10-CM

## 2017-04-29 DIAGNOSIS — R05 Cough: Secondary | ICD-10-CM

## 2017-04-29 DIAGNOSIS — R0600 Dyspnea, unspecified: Secondary | ICD-10-CM

## 2017-04-29 DIAGNOSIS — Z23 Encounter for immunization: Secondary | ICD-10-CM | POA: Diagnosis not present

## 2017-04-29 LAB — D-DIMER, QUANTITATIVE (NOT AT ARMC): D DIMER QUANT: 0.64 ug{FEU}/mL — AB (ref 0.00–0.50)

## 2017-04-29 MED ORDER — IPRATROPIUM BROMIDE 0.03 % NA SOLN: 2.0000 | Freq: Two times a day (BID) | NASAL | 12 refills | Status: AC

## 2017-04-29 NOTE — Progress Notes (Signed)
   SubjecO@ive :    Patient ID: [name], female    DOB: 03-29-1929, 83 y.o.   MRN: [id number]  HPI Patient here today for a follow up on Hospitalization for acute on chronic respiratory failure with hypoxia.Patient states she feels better,but she has a productive cough,and is on O2 24 hours a day seven days a week. Her appetite has not been spell her. She denies high fever chills she does relate productive cough she does have a history of a cavitary lesion in the chest she is had testing for TB she is also had testing for cancer both been negative she has severe COPD and progressive weight loss PMH benign Review of Systems  Constitutional: Negative for activity change and fever.  HENT: Negative for congestion, ear pain and rhinorrhea.   Eyes: Negative for discharge.  Respiratory: Positive for cough and shortness of breath. Negative for wheezing.   Cardiovascular: Negative for chest pain.       Objective:   Physical Exam  Constitutional: She appears well-developed and well-nourished. No distress.  HENT:  Head: Normocephalic and atraumatic.  Eyes: Right eye exhibits no discharge. Left eye exhibits no discharge.  Neck: No tracheal deviation present.  Cardiovascular: Normal rate, regular rhythm and normal heart sounds.   No murmur heard. Pulmonary/Chest: Effort normal and breath sounds normal. No respiratory distress. She has no wheezes. She has no rales.  Musculoskeletal: She exhibits no edema.  Lymphadenopathy:    She has no cervical adenopathy.  Neurological: She is alert. She exhibits normal muscle tone.  Skin: Skin is warm and dry. No erythema.  Psychiatric: Her behavior is normal.  Vitals reviewed.  Course cough is noted  25 minutes was spent with the patient. Greater than half the time was spent in discussion and answering questions and counseling regarding the issues that the patient came in for today.  D-dimer elevated    Assessment & Plan:  Significant  dyspnea Weight loss History of a Cavitary lesion along with a spicule This needs to be looked at further to make sure that this is not lung cancer going on In addition to this patient needs angiogram to rule out the possibility of pulmonary embolus because she is very sedentary Await the results of these tests. Progressive COPD in stage.

## 2017-04-29 NOTE — Telephone Encounter (Signed)
The patient's d-dimer is elevated. She does need to have CT scan of her chest that-CTA. According to the message from radiology the patient did not show up at radiology for herself daily-I wonder if she does not use the splint for lab did not go to have her scan? Please get this rescheduled for this week.

## 2017-04-30 ENCOUNTER — Other Ambulatory Visit: Payer: Self-pay | Admitting: Family Medicine

## 2017-04-30 ENCOUNTER — Ambulatory Visit (HOSPITAL_COMMUNITY)
Admission: RE | Admit: 2017-04-30 | Discharge: 2017-04-30 | Disposition: A | Discharge status: Home / Self Care | Payer: Medicare Other | Source: Ambulatory Visit | Attending: Family Medicine | Admitting: Family Medicine

## 2017-04-30 ENCOUNTER — Encounter: Payer: Self-pay | Admitting: Family Medicine

## 2017-04-30 ENCOUNTER — Telehealth: Payer: Self-pay | Admitting: Family Medicine

## 2017-04-30 DIAGNOSIS — R7989 Other specified abnormal findings of blood chemistry: Secondary | ICD-10-CM | POA: Diagnosis not present

## 2017-04-30 DIAGNOSIS — R918 Other nonspecific abnormal finding of lung field: Secondary | ICD-10-CM | POA: Insufficient documentation

## 2017-04-30 DIAGNOSIS — R059 Cough, unspecified: Secondary | ICD-10-CM

## 2017-04-30 DIAGNOSIS — R05 Cough: Secondary | ICD-10-CM | POA: Diagnosis not present

## 2017-04-30 DIAGNOSIS — I700 Atherosclerosis of aorta: Secondary | ICD-10-CM | POA: Insufficient documentation

## 2017-04-30 DIAGNOSIS — I2510 Atherosclerotic heart disease of native coronary artery without angina pectoris: Secondary | ICD-10-CM | POA: Diagnosis not present

## 2017-04-30 MED ORDER — CEFPROZIL 250 MG PO TABS: ORAL_TABLET | ORAL | 0 refills | Status: DC

## 2017-04-30 MED ORDER — IOPAMIDOL (ISOVUE-370) INJECTION 76%
80.0000 mL | Freq: Once | INTRAVENOUS | Status: AC | PRN
  Administered 2017-04-30: 80 mL via INTRAVENOUS

## 2017-04-30 NOTE — Telephone Encounter (Signed)
Spoke with patient's niece [name] and informed her per Dr.Scott Luking- D-dimer is elevated. Does need to have a CT scan of her chest that CTA. Patient's niece verbalized understanding got STAT CT rescheduled for today. Patient verbalized understanding.

## 2017-04-30 NOTE — Telephone Encounter (Signed)
Somehow the patient is on citalopram and Effexor -venlafaxine-these medications together can cause increased nausea can also increased risk of other issues therefore should not be on both please find out from family is she taking both?

## 2017-04-30 NOTE — Telephone Encounter (Signed)
Appointment scheduled.

## 2017-04-30 NOTE — Telephone Encounter (Signed)
I did discuss the case with [name] the knees I instructed them to follow-up in 2-3 weeks she will be calling to set up the appointment thank you

## 2017-05-02 NOTE — Telephone Encounter (Signed)
Left message return call

## 2017-05-15 ENCOUNTER — Encounter: Payer: Self-pay | Admitting: Family Medicine

## 2017-05-15 ENCOUNTER — Ambulatory Visit (INDEPENDENT_AMBULATORY_CARE_PROVIDER_SITE_OTHER): Payer: Medicare Other | Admitting: Family Medicine

## 2017-05-15 VITALS — BP 110/82 | Ht 59.0 in | Wt 77.0 lb

## 2017-05-15 DIAGNOSIS — T17908A Unspecified foreign body in respiratory tract, part unspecified causing other injury, initial encounter: Secondary | ICD-10-CM

## 2017-05-15 DIAGNOSIS — L0100 Impetigo, unspecified: Secondary | ICD-10-CM | POA: Diagnosis not present

## 2017-05-15 DIAGNOSIS — D179 Benign lipomatous neoplasm, unspecified: Secondary | ICD-10-CM | POA: Diagnosis not present

## 2017-05-15 DIAGNOSIS — K220 Achalasia of cardia: Secondary | ICD-10-CM | POA: Diagnosis not present

## 2017-05-15 DIAGNOSIS — R0600 Dyspnea, unspecified: Secondary | ICD-10-CM

## 2017-05-15 MED ORDER — MUPIROCIN 2 % EX OINT: TOPICAL_OINTMENT | CUTANEOUS | 1 refills | Status: AC

## 2017-05-15 NOTE — Progress Notes (Signed)
   Subjective:    Patient ID: [name], female    DOB: Nov 19, 1928, 83 y.o.   MRN: [id number]  Shortness of Breath   Pt is back today to follow up on her dyspnea Patient has gained some weight Her breathing doing better   .Also having some type of rash on her forehead. Gets intermittent rash on the forehead   She has some type of growth on her back she would like for you to look at. Has a lump on the back that they are concerned about   Also having some high bp's in the evening. She is on Metoprolol 25 mg 1/2 tablet BID. Blood pressure is been running high but they have been using a wrist cuff Review of Systems  Respiratory: Positive for shortness of breath.   Denies chest tightness pressure pain shortness of breath other than what comes with her COPD     Objective:   Physical Exam Neck no masses forehead has an area of impetigo lipoma on the upper back lungs are clear otherwise heart regular Extremities no edema 25 minutes was spent with the patient. Greater than half the time was spent in discussion and answering questions and counseling regarding the issues that the patient came in for today.      Assessment & Plan:  Blood pressure under good control when I checked it I would not recommend increasing medication proper way to check blood pressure discuss send [place] readings periodically I recommend a biceps cuff  Lipoma no interaction or intervention necessary  Possible impetigo on the upper forehead Bactroban ointment  Pulmonary issues stable currently the patient gets choked on liquids intermittently this is concerning  No true solid  food dysphagia  We will get speech therapy to meet with patient and consider barium swallow to see if there is any aspiration of thin liquids and to see if liquids need to be thickened

## 2017-05-19 ENCOUNTER — Other Ambulatory Visit (HOSPITAL_COMMUNITY): Payer: Self-pay | Admitting: Specialist

## 2017-05-19 DIAGNOSIS — R1319 Other dysphagia: Secondary | ICD-10-CM

## 2017-05-22 ENCOUNTER — Ambulatory Visit: Payer: Medicare Other | Admitting: Family Medicine

## 2017-05-26 ENCOUNTER — Emergency Department (HOSPITAL_COMMUNITY): Payer: Medicare Other

## 2017-05-26 ENCOUNTER — Ambulatory Visit (INDEPENDENT_AMBULATORY_CARE_PROVIDER_SITE_OTHER): Payer: Medicare Other | Admitting: Family Medicine

## 2017-05-26 ENCOUNTER — Encounter: Payer: Self-pay | Admitting: Family Medicine

## 2017-05-26 ENCOUNTER — Encounter (HOSPITAL_COMMUNITY): Payer: Self-pay | Admitting: Emergency Medicine

## 2017-05-26 ENCOUNTER — Inpatient Hospital Stay (HOSPITAL_COMMUNITY)
Admission: EM | Admit: 2017-05-26 | Discharge: 2017-06-04 | DRG: 309 | Disposition: A | Discharge status: Home Health | Payer: Medicare Other | Attending: Internal Medicine | Admitting: Internal Medicine

## 2017-05-26 ENCOUNTER — Ambulatory Visit (HOSPITAL_COMMUNITY): Payer: Medicare Other | Admitting: Speech Pathology

## 2017-05-26 ENCOUNTER — Other Ambulatory Visit: Payer: Self-pay

## 2017-05-26 VITALS — BP 142/88 | HR 111 | Ht 59.0 in | Wt 77.0 lb

## 2017-05-26 DIAGNOSIS — Z933 Colostomy status: Secondary | ICD-10-CM

## 2017-05-26 DIAGNOSIS — I350 Nonrheumatic aortic (valve) stenosis: Secondary | ICD-10-CM | POA: Diagnosis present

## 2017-05-26 DIAGNOSIS — I4892 Unspecified atrial flutter: Secondary | ICD-10-CM | POA: Diagnosis not present

## 2017-05-26 DIAGNOSIS — R000 Tachycardia, unspecified: Secondary | ICD-10-CM | POA: Diagnosis not present

## 2017-05-26 DIAGNOSIS — K219 Gastro-esophageal reflux disease without esophagitis: Secondary | ICD-10-CM | POA: Diagnosis present

## 2017-05-26 DIAGNOSIS — J849 Interstitial pulmonary disease, unspecified: Secondary | ICD-10-CM | POA: Diagnosis not present

## 2017-05-26 DIAGNOSIS — Z881 Allergy status to other antibiotic agents status: Secondary | ICD-10-CM

## 2017-05-26 DIAGNOSIS — R5383 Other fatigue: Secondary | ICD-10-CM | POA: Diagnosis not present

## 2017-05-26 DIAGNOSIS — R7989 Other specified abnormal findings of blood chemistry: Secondary | ICD-10-CM | POA: Diagnosis present

## 2017-05-26 DIAGNOSIS — M810 Age-related osteoporosis without current pathological fracture: Secondary | ICD-10-CM | POA: Diagnosis present

## 2017-05-26 DIAGNOSIS — F411 Generalized anxiety disorder: Secondary | ICD-10-CM | POA: Diagnosis present

## 2017-05-26 DIAGNOSIS — I214 Non-ST elevation (NSTEMI) myocardial infarction: Secondary | ICD-10-CM | POA: Diagnosis not present

## 2017-05-26 DIAGNOSIS — F418 Other specified anxiety disorders: Secondary | ICD-10-CM | POA: Diagnosis present

## 2017-05-26 DIAGNOSIS — J9611 Chronic respiratory failure with hypoxia: Secondary | ICD-10-CM | POA: Diagnosis present

## 2017-05-26 DIAGNOSIS — T18128A Food in esophagus causing other injury, initial encounter: Secondary | ICD-10-CM | POA: Diagnosis present

## 2017-05-26 DIAGNOSIS — R1310 Dysphagia, unspecified: Secondary | ICD-10-CM | POA: Diagnosis not present

## 2017-05-26 DIAGNOSIS — I4891 Unspecified atrial fibrillation: Secondary | ICD-10-CM | POA: Diagnosis not present

## 2017-05-26 DIAGNOSIS — R1314 Dysphagia, pharyngoesophageal phase: Secondary | ICD-10-CM | POA: Diagnosis present

## 2017-05-26 DIAGNOSIS — D649 Anemia, unspecified: Secondary | ICD-10-CM | POA: Diagnosis present

## 2017-05-26 DIAGNOSIS — Z886 Allergy status to analgesic agent status: Secondary | ICD-10-CM | POA: Diagnosis not present

## 2017-05-26 DIAGNOSIS — Z903 Acquired absence of stomach [part of]: Secondary | ICD-10-CM

## 2017-05-26 DIAGNOSIS — Z887 Allergy status to serum and vaccine status: Secondary | ICD-10-CM

## 2017-05-26 DIAGNOSIS — J8410 Pulmonary fibrosis, unspecified: Secondary | ICD-10-CM | POA: Diagnosis present

## 2017-05-26 DIAGNOSIS — R748 Abnormal levels of other serum enzymes: Secondary | ICD-10-CM

## 2017-05-26 DIAGNOSIS — K449 Diaphragmatic hernia without obstruction or gangrene: Secondary | ICD-10-CM | POA: Diagnosis present

## 2017-05-26 DIAGNOSIS — R933 Abnormal findings on diagnostic imaging of other parts of digestive tract: Secondary | ICD-10-CM | POA: Diagnosis not present

## 2017-05-26 DIAGNOSIS — Z681 Body mass index (BMI) 19 or less, adult: Secondary | ICD-10-CM

## 2017-05-26 DIAGNOSIS — E039 Hypothyroidism, unspecified: Secondary | ICD-10-CM | POA: Diagnosis present

## 2017-05-26 DIAGNOSIS — T182XXA Foreign body in stomach, initial encounter: Secondary | ICD-10-CM | POA: Diagnosis not present

## 2017-05-26 DIAGNOSIS — R1311 Dysphagia, oral phase: Secondary | ICD-10-CM | POA: Diagnosis present

## 2017-05-26 DIAGNOSIS — J449 Chronic obstructive pulmonary disease, unspecified: Secondary | ICD-10-CM | POA: Diagnosis present

## 2017-05-26 DIAGNOSIS — R112 Nausea with vomiting, unspecified: Secondary | ICD-10-CM | POA: Diagnosis not present

## 2017-05-26 DIAGNOSIS — R079 Chest pain, unspecified: Secondary | ICD-10-CM | POA: Diagnosis not present

## 2017-05-26 DIAGNOSIS — Z9981 Dependence on supplemental oxygen: Secondary | ICD-10-CM | POA: Diagnosis not present

## 2017-05-26 DIAGNOSIS — R9431 Abnormal electrocardiogram [ECG] [EKG]: Secondary | ICD-10-CM | POA: Diagnosis not present

## 2017-05-26 DIAGNOSIS — I361 Nonrheumatic tricuspid (valve) insufficiency: Secondary | ICD-10-CM | POA: Diagnosis not present

## 2017-05-26 DIAGNOSIS — R1319 Other dysphagia: Secondary | ICD-10-CM

## 2017-05-26 DIAGNOSIS — K3184 Gastroparesis: Secondary | ICD-10-CM | POA: Diagnosis present

## 2017-05-26 DIAGNOSIS — Z888 Allergy status to other drugs, medicaments and biological substances status: Secondary | ICD-10-CM

## 2017-05-26 DIAGNOSIS — Z79899 Other long term (current) drug therapy: Secondary | ICD-10-CM | POA: Diagnosis not present

## 2017-05-26 DIAGNOSIS — I340 Nonrheumatic mitral (valve) insufficiency: Secondary | ICD-10-CM | POA: Diagnosis not present

## 2017-05-26 DIAGNOSIS — K589 Irritable bowel syndrome without diarrhea: Secondary | ICD-10-CM | POA: Diagnosis present

## 2017-05-26 LAB — COMPREHENSIVE METABOLIC PANEL
ALT: 12 U/L — ABNORMAL LOW (ref 14–54)
ANION GAP: 9 (ref 5–15)
AST: 26 U/L (ref 15–41)
Albumin: 4 g/dL (ref 3.5–5.0)
Alkaline Phosphatase: 103 U/L (ref 38–126)
BILIRUBIN TOTAL: 0.6 mg/dL (ref 0.3–1.2)
BUN: 11 mg/dL (ref 6–20)
CHLORIDE: 102 mmol/L (ref 101–111)
CO2: 30 mmol/L (ref 22–32)
Calcium: 9.4 mg/dL (ref 8.9–10.3)
Creatinine, Ser: 0.99 mg/dL (ref 0.44–1.00)
GFR, EST AFRICAN AMERICAN: 58 mL/min — AB (ref >60)
GFR, EST NON AFRICAN AMERICAN: 50 mL/min — AB (ref >60)
Glucose, Bld: 115 mg/dL — ABNORMAL HIGH (ref 65–99)
POTASSIUM: 3.9 mmol/L (ref 3.5–5.1)
Sodium: 141 mmol/L (ref 135–145)
TOTAL PROTEIN: 8.5 g/dL — AB (ref 6.5–8.1)

## 2017-05-26 LAB — MRSA PCR SCREENING: MRSA BY PCR: NEGATIVE

## 2017-05-26 LAB — CBC
HCT: 43.7 % (ref 36.0–46.0)
Hemoglobin: 13.9 g/dL (ref 12.0–15.0)
MCH: 32.5 pg (ref 26.0–34.0)
MCHC: 31.8 g/dL (ref 30.0–36.0)
MCV: 102.1 fL — AB (ref 78.0–100.0)
PLATELETS: 355 10*3/uL (ref 150–400)
RBC: 4.28 MIL/uL (ref 3.87–5.11)
RDW: 14.7 % (ref 11.5–15.5)
WBC: 13.4 10*3/uL — ABNORMAL HIGH (ref 4.0–10.5)

## 2017-05-26 LAB — TROPONIN I
TROPONIN I: 0.04 ng/mL — AB (ref <0.03)
TROPONIN I: 0.04 ng/mL — AB (ref <0.03)

## 2017-05-26 LAB — TSH: TSH: 1.308 u[IU]/mL (ref 0.350–4.500)

## 2017-05-26 LAB — POCT HEMOGLOBIN: HEMOGLOBIN: 13.3 g/dL (ref 12.2–16.2)

## 2017-05-26 LAB — T4, FREE: FREE T4: 0.86 ng/dL (ref 0.61–1.12)

## 2017-05-26 LAB — BRAIN NATRIURETIC PEPTIDE: B Natriuretic Peptide: 1803 pg/mL — ABNORMAL HIGH (ref 0.0–100.0)

## 2017-05-26 MED ORDER — VENLAFAXINE HCL ER 37.5 MG PO CP24
37.5000 mg | ORAL_CAPSULE | Freq: Every day | ORAL | Status: DC
  Administered 2017-05-27 – 2017-06-04 (×8): 37.5 mg via ORAL
  Filled 2017-05-26 (×12): qty 1

## 2017-05-26 MED ORDER — ACETAMINOPHEN 325 MG PO TABS: 650.0000 mg | ORAL_TABLET | Freq: Four times a day (QID) | ORAL | Status: DC | PRN

## 2017-05-26 MED ORDER — ONDANSETRON HCL 4 MG/2ML IJ SOLN
4.0000 mg | Freq: Four times a day (QID) | INTRAMUSCULAR | Status: DC | PRN
  Administered 2017-05-28 – 2017-05-30 (×3): 4 mg via INTRAVENOUS
  Filled 2017-05-26 (×3): qty 2

## 2017-05-26 MED ORDER — METOPROLOL TARTRATE 25 MG PO TABS
12.5000 mg | ORAL_TABLET | Freq: Two times a day (BID) | ORAL | Status: DC
  Administered 2017-05-26 – 2017-05-28 (×5): 12.5 mg via ORAL
  Filled 2017-05-26 (×5): qty 1

## 2017-05-26 MED ORDER — ACETAMINOPHEN 325 MG PO TABS: 650.0000 mg | ORAL_TABLET | ORAL | Status: DC | PRN

## 2017-05-26 MED ORDER — IPRATROPIUM BROMIDE 0.02 % IN SOLN
0.5000 mg | Freq: Four times a day (QID) | RESPIRATORY_TRACT | Status: DC
  Administered 2017-05-26 – 2017-05-27 (×2): 0.5 mg via RESPIRATORY_TRACT
  Filled 2017-05-26 (×3): qty 2.5

## 2017-05-26 MED ORDER — SODIUM CHLORIDE 0.9 % IV BOLUS (SEPSIS)
500.0000 mL | Freq: Once | INTRAVENOUS | Status: AC
  Administered 2017-05-26: 500 mL via INTRAVENOUS

## 2017-05-26 MED ORDER — MELATONIN 5 MG PO CAPS: 5.0000 mg | ORAL_CAPSULE | Freq: Every day | ORAL | Status: DC

## 2017-05-26 MED ORDER — MEMANTINE HCL 10 MG PO TABS
10.0000 mg | ORAL_TABLET | Freq: Two times a day (BID) | ORAL | Status: DC
  Administered 2017-05-26 – 2017-06-04 (×18): 10 mg via ORAL
  Filled 2017-05-26 (×18): qty 1

## 2017-05-26 MED ORDER — DILTIAZEM HCL-DEXTROSE 100-5 MG/100ML-% IV SOLN (PREMIX)
5.0000 mg/h | Freq: Once | INTRAVENOUS | Status: AC
  Administered 2017-05-26: 5 mg/h via INTRAVENOUS
  Filled 2017-05-26: qty 100

## 2017-05-26 MED ORDER — ENOXAPARIN SODIUM 40 MG/0.4ML ~~LOC~~ SOLN
35.0000 mg | SUBCUTANEOUS | Status: DC
  Administered 2017-05-26: 35 mg via SUBCUTANEOUS
  Filled 2017-05-26: qty 0.4

## 2017-05-26 MED ORDER — POTASSIUM CHLORIDE CRYS ER 10 MEQ PO TBCR
10.0000 meq | EXTENDED_RELEASE_TABLET | Freq: Every day | ORAL | Status: DC
  Administered 2017-05-27 – 2017-06-04 (×9): 10 meq via ORAL
  Filled 2017-05-26 (×11): qty 1

## 2017-05-26 MED ORDER — IPRATROPIUM BROMIDE HFA 17 MCG/ACT IN AERS: 2.0000 | INHALATION_SPRAY | Freq: Four times a day (QID) | RESPIRATORY_TRACT | Status: DC

## 2017-05-26 MED ORDER — IPRATROPIUM BROMIDE 0.03 % NA SOLN
2.0000 | Freq: Two times a day (BID) | NASAL | Status: DC
  Administered 2017-05-27 – 2017-06-04 (×12): 2 via NASAL
  Filled 2017-05-26: qty 30

## 2017-05-26 MED ORDER — ACETAMINOPHEN 325 MG PO TABS
650.0000 mg | ORAL_TABLET | ORAL | Status: DC | PRN
Start: 2017-05-26 — End: 2017-06-04
  Administered 2017-05-26 – 2017-06-02 (×6): 650 mg via ORAL
  Filled 2017-05-26 (×6): qty 2

## 2017-05-26 MED ORDER — DILTIAZEM HCL-DEXTROSE 100-5 MG/100ML-% IV SOLN (PREMIX)
5.0000 mg/h | INTRAVENOUS | Status: DC
  Administered 2017-05-27 (×2): 10 mg/h via INTRAVENOUS
  Filled 2017-05-26 (×2): qty 100

## 2017-05-26 MED ORDER — DIAZEPAM 2 MG PO TABS
2.0000 mg | ORAL_TABLET | Freq: Two times a day (BID) | ORAL | Status: DC | PRN
  Administered 2017-05-26 – 2017-06-03 (×9): 2 mg via ORAL
  Filled 2017-05-26 (×9): qty 1

## 2017-05-26 MED ORDER — CITALOPRAM HYDROBROMIDE 20 MG PO TABS
10.0000 mg | ORAL_TABLET | Freq: Every day | ORAL | Status: DC
  Administered 2017-05-27 – 2017-06-04 (×9): 10 mg via ORAL
  Filled 2017-05-26 (×12): qty 1

## 2017-05-26 NOTE — ED Triage Notes (Signed)
Pt c/o "feeling bad" and having heart palpitations x 4 days. Pt sent from PCP office.

## 2017-05-26 NOTE — Progress Notes (Signed)
ANTICOAGULATION CONSULT NOTE - Initial Consult  Pharmacy Consult for lovenox Indication: atrial fibrillation  Allergies  Allergen Reactions  . Levaquin [Levofloxacin] Other (See Comments)    Patient states that she became weak and extremely tired. Also, it made her real dizzy.  . Aspirin Nausea And Vomiting    Nausea/vomiting blood  . Tetanus Toxoid Hives and Swelling  . Zolpidem Tartrate     Not in right state of mind  . Fosamax [Alendronate Sodium] Other (See Comments)    Severe gastritis reflux.    Patient Measurements: Height: 4\' 11"  (149.9 cm) Weight: 77 lb (34.9 kg) IBW/kg (Calculated) : 43.2  Vital Signs: Temp: 97.8 F (36.6 C) (11/12 1227) BP: 133/99 (11/12 1630) Pulse Rate: 128 (11/12 1630)  Labs: Recent Labs    05/26/17 1126 05/26/17 1315 05/26/17 1317  HGB 13.3 13.9  --   HCT  --  43.7  --   PLT  --  355  --   CREATININE  --   --  0.99  TROPONINI  --  0.04*  --     Estimated Creatinine Clearance: 22.5 mL/min (by C-G formula based on SCr of 0.99 mg/dL).   Medical History: Past Medical History:  Diagnosis Date  . Anemia   . 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 Chronic abdominal pain   . Chronic neck and back pain   . COPD (chronic obstructive pulmonary disease) ([place])   . GAD (generalized anxiety disorder)   . Gastroesophageal reflux disease   . Gastroparesis   . IBS (irritable bowel syndrome)   . Lung mass   . Migraines   . Osteoporosis   . 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 Renal insufficiency   . S/P colostomy ([place])   . Tachycardia     Medications:  See med rec  Assessment: 83 yo female admitted with palpitations.  Irregularly irregular heart rate with tachycardia into the 160s.  No chest pain but troponin is mildly elevated. Pharmacy asked to start lovenox   Goal of Therapy:  Monitor platelets by anticoagulation protocol: Yes   Plan:  Lovenox 1mg /kg (35mg ) sq q24h (crcl 53mls/min) Monitor for s/s of bleeding  Isac Sarna, BS [place],  BCPS Clinical Pharmacist Pager [phone] 05/26/2017,5:03 PM

## 2017-05-26 NOTE — Progress Notes (Signed)
Patient has small bruise on both elbows. Has 4cm round fluid sack on left shoulder blade. Does not appear to be blister however.

## 2017-05-26 NOTE — Consult Note (Signed)
Cardiology Consultation:   Patient ID: ARDEAN SIMONICH; [id number]; Dec 10, 1928   Admit date: 05/26/2017 Date of Consult: 05/26/2017  Primary Care Provider: Kathyrn Drown, MD Primary Cardiologist: New   Patient Profile:   [name] is a 83 y.o. female with a hx of chronic interstitial lung disease and hypothyroidism who is being seen today for the evaluation of atrial fibrillation at the request of [name].  History of Present Illness:   [name] is an 83 year old woman with a history of chronic interstitial lung disease and follows with pulmonary along with hypothyroidism.    She saw her PCP in the office today complaining of palpitations and increasing exertional dyspnea from baseline.  She tells me that when she walks from her dental the bathroom she becomes more short of breath and feels weak and feels her heart racing.  She denies exertional chest pain.  After an ECG was performed and found to be abnormal she was instructed to go to the ED by her PCP.  Pertinent labs include normal TSH of 1.3, mildly elevated leukocytosis with white blood cells of 13.4, BUN 11 and creatinine 0.99, and mildly elevated troponin of 0.04.  Chest x-ray today showed advanced chronic lung disease with scarring and areas of cavitation.  Initial ECG performed at 12:30 PM showed tachycardia with a heart rate of 114 bpm, old lateral and septal infarcts, and diffuse ST segment abnormalities.  Subsequent ECG demonstrated rapid atrial flutter with probable 2-1 conduction, 114 bpm, with old anterolateral infarct and inferolateral ST segment and T wave abnormalities suggestive of ischemia.  She was started on an IV diltiazem drip in the ED and started on full-strength Lovenox.  At rest she says she is feeling well and denies chest pain and palpitations.  Chronic exertional dyspnea is presently stable.  Past Medical History:  Diagnosis Date  . Anemia   . 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  Chronic abdominal pain   . Chronic neck and back pain   . COPD (chronic obstructive pulmonary disease) ([place])   . GAD (generalized anxiety disorder)   . Gastroesophageal reflux disease   . Gastroparesis   . IBS (irritable bowel syndrome)   . Lung mass   . Migraines   . Osteoporosis   . 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 Renal insufficiency   . S/P colostomy ([place])   . Tachycardia     Past Surgical History:  Procedure Laterality Date  . ABDOMINAL HYSTERECTOMY    . APPENDECTOMY    . CHOLECYSTECTOMY    . COLOSTOMY    . ESOPHAGOGASTRODUODENOSCOPY    . PARTIAL GASTRECTOMY  1967   3/4 gastric resection for bleeding ulcers  . TONSILLECTOMY         Inpatient Medications: Scheduled Meds: . citalopram  10 mg Oral Daily  . enoxaparin (LOVENOX) injection  35 mg Subcutaneous Q24H  . ipratropium  2 puff Inhalation QID  . ipratropium  2 spray Each Nare Q12H  . memantine  10 mg Oral BID  . metoprolol tartrate  12.5 mg Oral BID  . potassium chloride  10 mEq Oral Daily  . [START ON 05/27/2017] venlafaxine XR  37.5 mg Oral Q breakfast   Continuous Infusions: . diltiazem (CARDIZEM) infusion 10 mg/hr (05/26/17 1713)   PRN Meds: acetaminophen, diazepam, ondansetron (ZOFRAN) IV  Allergies:    Allergies  Allergen Reactions  . Levaquin [Levofloxacin] Other (See Comments)    Patient states that she became weak and extremely tired. Also, it made  her real dizzy.  . Aspirin Nausea And Vomiting    Nausea/vomiting blood  . Tetanus Toxoid Hives and Swelling  . Zolpidem Tartrate     Not in right state of mind  . Fosamax [Alendronate Sodium] Other (See Comments)    Severe gastritis reflux.    Social History:   Social History   Socioeconomic History  . Marital status: Married    Spouse name: Not on file  . Number of children: Not on file  . Years of education: Not on file  . Highest education level: Not on file  Social Needs  . Financial resource strain: Not on file  . Food insecurity -  worry: Not on file  . Food insecurity - inability: Not on file  . Transportation needs - medical: Not on file  . Transportation needs - non-medical: Not on file  Occupational History  . Not on file  Tobacco Use  . Smoking status: Never Smoker  . Smokeless tobacco: Never Used  Substance and Sexual Activity  . Alcohol use: No    Alcohol/week: 0.0 oz  . Drug use: No  . Sexual activity: Not on file  Other Topics Concern  . Not on file  Social History Narrative  . Not on file    Family History:    Family History  Problem Relation Age of Onset  . Stroke Mother   . Stroke Father   . Diabetes Sister   . Diabetes Brother   . Stroke Brother   . Cancer Sister        unknown type  . Diabetes Sister   . Emphysema Sister   . Diabetes Brother   . Stroke Brother   . Diabetes Son   . Irritable bowel syndrome Son   . Diabetes Son   . Hypertension Son      ROS:  Please see the history of present illness.  ROS  All other ROS reviewed and negative.     Physical Exam/Data:   Vitals:   05/26/17 1600 05/26/17 1615 05/26/17 1630 05/26/17 1700  BP: 127/87 (!) 143/93 (!) 133/99 (!) 130/92  Pulse:  99 (!) 128 (!) 130  Resp: 17 (!) 26 19 19   Temp:      SpO2:  (!) 81% 93% 94%  Weight:      Height:        Intake/Output Summary (Last 24 hours) at 05/26/2017 1753 Last data filed at 05/26/2017 1609 Gross per 24 hour  Intake 500 ml  Output -  Net 500 ml   Filed Weights   05/26/17 1224  Weight: 77 lb (34.9 kg)   Body mass index is 15.55 kg/m.  General: Elderly chronically ill appearing woman in no acute distress HEENT: normal Lymph: no adenopathy Neck: no JVD Endocrine:  No thryomegaly  Cardiac: Tachycardic, mostly regular rhythm, no murmurs or rubs appreciated Lungs: Poor air movement with crackles in the left upper and middle lung fields and faint end expiratory wheezes on the right Abd: soft, nontender, no hepatomegaly  Ext: no edema Musculoskeletal:  No  deformities Skin: warm and dry  Neuro:  no focal abnormalities noted Psych:  Normal affect    Relevant CV Studies: None at this time  Laboratory Data:  Chemistry Recent Labs  Lab 05/26/17 1317  NA 141  K 3.9  CL 102  CO2 30  GLUCOSE 115*  BUN 11  CREATININE 0.99  CALCIUM 9.4  GFRNONAA 50*  GFRAA 58*  ANIONGAP 9    Recent Labs  Lab 05/26/17 1317  PROT 8.5*  ALBUMIN 4.0  AST 26  ALT 12*  ALKPHOS 103  BILITOT 0.6   Hematology Recent Labs  Lab 05/26/17 1126 05/26/17 1315  WBC  --  13.4*  RBC  --  4.28  HGB 13.3 13.9  HCT  --  43.7  MCV  --  102.1*  MCH  --  32.5  MCHC  --  31.8  RDW  --  14.7  PLT  --  355   Cardiac Enzymes Recent Labs  Lab 05/26/17 1315 05/26/17 1703  TROPONINI 0.04* 0.04*   No results for input(s): TROPIPOC in the last 168 hours.  BNP Recent Labs  Lab 05/26/17 1703  BNP 1,803.0*    DDimer No results for input(s): DDIMER in the last 168 hours.  Radiology/Studies:  Dg Chest 2 View  Result Date: 05/26/2017 CLINICAL DATA:  Chest pain.  Cavitary lung disease. EXAM: CHEST  2 VIEW COMPARISON:  04/22/2017 FINDINGS: Heart size remains normal. Chronic aortic atherosclerosis. Chronic lung disease with scarring and cavity formation as seen previously. Pulmonary density appears similar. No sign of consolidation or fluid accumulation within the cystic areas. No pleural effusions. No acute bone finding. IMPRESSION: Advanced chronic lung disease with scarring and areas of cavitation. No sign of identifiable acute inflammation. A would certainly be possible for minor inflammatory changes to be hidden amongst the chronic densities. Electronically Signed   By: Nelson Chimes M.D.   On: 05/26/2017 13:10    Assessment and Plan:   1.  Rapid atrial flutter: Currently on IV diltiazem drip at 10 mg/h.  She is on full strength Lovenox.  I will wait to see how her rhythm response by tomorrow.  She would likely have to be initiated on oral calcium channel  blockers for long-term maintenance therapy as well as anticoagulation for thromboembolic risk reduction with either Xarelto or Eliquis.  2.  Troponin elevation: It is unclear if this truly represents a non-STEMI at this time.  She denies chest pain.  I will continue to cycle troponins every 6 hours and monitor trend.  She is on Lovenox.  3.  Chronic interstitial lung disease: This appears to be stable and is followed by pulmonary.   For questions or updates, please contact [place] Please consult [email or web address] for contact info under Cardiology/STEMI.   Signed, Kate Sable, MD  05/26/2017 5:53 PM

## 2017-05-26 NOTE — ED Notes (Signed)
CRITICAL VALUE ALERT  Critical Value:  0.04 troponin  Date & Time Notied:  05/26/17 1424  Provider Notified: yelverton   Orders Received/Actions taken:

## 2017-05-26 NOTE — H&P (Signed)
History and Physical    [name] STM:[id number] DOB: Sep 02, 1928 DOA: 05/26/2017  Referring MD/NP/PA: Lita Mains   PCP: Kathyrn Drown, MD  Outpatient Specialists: None  [facility] speciality and name if known) Patient coming from: Home   Chief Complaint: Atrial Fibrillation w/ RVR   HPI: [name] is a 83 y.o. female with medical history significant of anemia, cavitary lung disease, IBS, lung mass presenting w/ atrial fibrillation w/ RVR. Per report from pt and caregiver pt has had recurrent episodes of heart palpitations and increased WOB over last 4-5 days. No fevers or chills. No CP.  Does report drinking coffee twice daily.  ED Course: Presented to ER afebrile, HR into 120s. EKG w atrial flutter 2:1 block vs. afib w/ RVR. WBC 13.4. CXR w/ chronic interstitial lung changes. Intermittently hypoxic in setting of ILD.   Review of Systems: As per HPI otherwise 10 point review of systems negative.   Past Medical History:  Diagnosis Date  . Anemia   . 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 Chronic abdominal pain   . Chronic neck and back pain   . COPD (chronic obstructive pulmonary disease) ([place])   . GAD (generalized anxiety disorder)   . Gastroesophageal reflux disease   . Gastroparesis   . IBS (irritable bowel syndrome)   . Lung mass   . Migraines   . Osteoporosis   . 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 Renal insufficiency   . S/P colostomy ([place])   . Tachycardia     Past Surgical History:  Procedure Laterality Date  . ABDOMINAL HYSTERECTOMY    . APPENDECTOMY    . CHOLECYSTECTOMY    . COLOSTOMY    . ESOPHAGOGASTRODUODENOSCOPY    . PARTIAL GASTRECTOMY  1967   3/4 gastric resection for bleeding ulcers  . TONSILLECTOMY       reports that  has never smoked. she has never used smokeless tobacco. She reports that she does not drink alcohol or use drugs.  Allergies  Allergen Reactions  . Levaquin [Levofloxacin] Other (See Comments)    Patient states that she became weak and  extremely tired. Also, it made her real dizzy.  . Aspirin Nausea And Vomiting    Nausea/vomiting blood  . Tetanus Toxoid Hives and Swelling  . Zolpidem Tartrate     Not in right state of mind  . Fosamax [Alendronate Sodium] Other (See Comments)    Severe gastritis reflux.    Family History  Problem Relation Age of Onset  . Stroke Mother   . Stroke Father   . Diabetes Sister   . Diabetes Brother   . Stroke Brother   . Cancer Sister        unknown type  . Diabetes Sister   . Emphysema Sister   . Diabetes Brother   . Stroke Brother   . Diabetes Son   . Irritable bowel syndrome Son   . Diabetes Son   . Hypertension Son      Prior to Admission medications   Medication Sig Start Date End Date Taking? Authorizing Provider  acetaminophen (TYLENOL) 325 MG tablet Take 2 tablets (650 mg total) by mouth every 6 (six) hours as needed for mild pain or headache (or Fever >/= 101). 12/22/15  Yes Samuella Cota, MD  citalopram (CELEXA) 10 MG tablet TAKE (1) TABLET BY MOUTH ONCE DAILY. 01/20/17  Yes Luking, Scott A, MD  diazepam (VALIUM) 2 MG tablet Take 1 tablets twice a daily as needed. Take sparingly (Prescription  must last 30 days) 03/06/17  Yes Luking, Elayne Snare, MD  Doxylamine-DM (ROBITUSSIN NIGHTTIME COUGH DM) 12.5-30 MG/10ML LIQD Take 5 mLs by mouth daily as needed (for cough). Patient taked 1 teaspoon as needed.    Yes [provider]  ipratropium (ATROVENT HFA) 17 MCG/ACT inhaler 2 PUFFS FOUR TIMES DAILY. Patient taking differently: Inhale 2 puffs into the lungs 4 (four) times daily.  09/20/16  Yes Luking, Scott A, MD  ipratropium (ATROVENT) 0.03 % nasal spray Place 2 sprays into both nostrils every 12 (twelve) hours. 04/29/17  Yes Luking, Elayne Snare, MD  levothyroxine (SYNTHROID, LEVOTHROID) 25 MCG tablet TAKE 2 TABLETS BY MOUTH ON MONDAY AND FRIDAY. & 1 TABLET ALL OTHER DAYS 05/08/17  Yes Luking, Scott A, MD  loperamide (IMODIUM) 2 MG capsule TAKE (1) CAPSULE THREE TIMES DAILY  BEFORE MEALS. 02/07/17  Yes Rehman, Mechele Dawley, MD  Melatonin 5 MG CAPS Take 5 mg by mouth at bedtime.   Yes [provider]  memantine (NAMENDA) 10 MG tablet TAKE 1 TABLET BY MOUTH TWICE DAILY. 02/20/17  Yes Luking, Scott A, MD  metoprolol tartrate (LOPRESSOR) 25 MG tablet TAKE 1/2 TABLET TWICE DAILY FOR HIGH BLOOD PRESSURE. 01/20/17  Yes Kathyrn Drown, MD  mupirocin ointment (BACTROBAN) 2 % Apply to affected area 2 times daily 05/15/17 05/15/18 Yes Luking, Elayne Snare, MD  OXYGEN Inhale into the lungs. 2 liters -Nasal [place]ual   Yes [provider]  potassium chloride (K-DUR) 10 MEQ tablet TAKE (1) TABLET BY MOUTH ONCE DAILY. 05/08/17  Yes Luking, Elayne Snare, MD  Simethicone (GAS RELIEF PO) Take 1 tablet by mouth daily as needed (for gas relief).    Yes [provider]  venlafaxine XR (EFFEXOR-XR) 37.5 MG 24 hr capsule TAKE 1 CAPSULE BY MOUTH ONCE DAILY WITH BREAKFAST 05/08/17  Yes Luking, Elayne Snare, MD  chlorpheniramine-HYDROcodone (TUSSIONEX PENNKINETIC ER) 10-8 MG/5ML SUER Take 5 mLs by mouth every 12 (twelve) hours as needed for cough. Patient not taking: Reported on 05/26/2017 03/06/17   Kathyrn Drown, MD  lipase/protease/amylase (CREON) 12000 units CPEP capsule TAKE 2 CAPSULES BY MOUTH 3 TIMES DAILY WITH MEALS. TAKE 1 CAPSULE TWICE DAILY WITH SNACKS Patient not taking: Reported on 05/26/2017 04/23/17   Rogene [place], MD  venlafaxine XR (EFFEXOR-XR) 75 MG 24 hr capsule TAKE 1 CAPSULE ONCE DAILY WITH BREAKFAST. 12/30/16   Kathyrn Drown, MD    Physical Exam: Vitals:   05/26/17 1530 05/26/17 1545 05/26/17 1600 05/26/17 1615  BP: 136/83 (!) 137/95 127/87 (!) 143/93  Pulse: 87 (!) 119  99  Resp: 20 (!) 25 17 (!) 26  Temp:      SpO2: (!) 75% 95%  (!) 81%  Weight:      Height:          Constitutional: NAD, calm, comfortable Vitals:   05/26/17 1530 05/26/17 1545 05/26/17 1600 05/26/17 1615  BP: 136/83 (!) 137/95 127/87 (!) 143/93  Pulse: 87 (!) 119  99  Resp: 20 (!) 25  17 (!) 26  Temp:      SpO2: (!) 75% 95%  (!) 81%  Weight:      Height:       Eyes: PERRL, lids and conjunctivae normal ENMT: Mucous membranes are moist. Posterior pharynx clear of any exudate or lesions.Normal dentition.  Neck: normal, supple, no masses, no thyromegaly Respiratory: clear to auscultation bilaterally, no wheezing, no crackles. Normal respiratory effort. No accessory muscle use.  Cardiovascular:Tachycardic-irregular , no murmurs / rubs /  gallops. No extremity edema. 2+ pedal pulses. No carotid bruits.  Abdomen: no tenderness, no masses palpated. No hepatosplenomegaly. Bowel sounds positive.  Musculoskeletal: no clubbing / cyanosis. No joint deformity upper and lower extremities. Good ROM, no contractures. Normal muscle tone.  Skin: no rashes, lesions, ulcers. No induration Neurologic: CN 2-12 grossly intact. Sensation intact, DTR normal. Strength 5/5 in all 4.  Psychiatric: Normal judgment and insight. Alert and oriented x 3. Normal mood.   Labs on Admission: I have personally reviewed following labs and imaging studies  CBC: Recent Labs  Lab 05/26/17 1126 05/26/17 1315  WBC  --  13.4*  HGB 13.3 13.9  HCT  --  43.7  MCV  --  102.1*  PLT  --  086   Basic Metabolic Panel: Recent Labs  Lab 05/26/17 1317  NA 141  K 3.9  CL 102  CO2 30  GLUCOSE 115*  BUN 11  CREATININE 0.99  CALCIUM 9.4   GFR: Estimated Creatinine Clearance: 22.5 mL/min (by C-G formula based on SCr of 0.99 mg/dL). Liver Function Tests: Recent Labs  Lab 05/26/17 1317  AST 26  ALT 12*  ALKPHOS 103  BILITOT 0.6  PROT 8.5*  ALBUMIN 4.0   No results for input(s): LIPASE, AMYLASE in the last 168 hours. No results for input(s): AMMONIA in the last 168 hours. Coagulation Profile: No results for input(s): INR, PROTIME in the last 168 hours. Cardiac Enzymes: Recent Labs  Lab 05/26/17 1315  TROPONINI 0.04*   BNP (last 3 results) No results for input(s): PROBNP in the last 8760  hours. HbA1C: No results for input(s): HGBA1C in the last 72 hours. CBG: No results for input(s): GLUCAP in the last 168 hours. Lipid Profile: No results for input(s): CHOL, HDL, LDLCALC, TRIG, CHOLHDL, LDLDIRECT in the last 72 hours. Thyroid Function Tests: Recent Labs    05/26/17 1317  TSH 1.308   Anemia Panel: No results for input(s): VITAMINB12, FOLATE, FERRITIN, TIBC, IRON, RETICCTPCT in the last 72 hours. Urine analysis:    Component Value Date/Time   COLORURINE AMBER (A) 08/22/2016 1747   APPEARANCEUR CLEAR 08/22/2016 1747   LABSPEC 1.010 08/22/2016 1747   PHURINE 5.0 08/22/2016 1747   GLUCOSEU NEGATIVE 08/22/2016 1747   HGBUR NEGATIVE 08/22/2016 1747   BILIRUBINUR NEGATIVE 08/22/2016 1747   KETONESUR NEGATIVE 08/22/2016 1747   PROTEINUR NEGATIVE 08/22/2016 1747   UROBILINOGEN 0.2 08/30/2014 2021   NITRITE POSITIVE (A) 08/22/2016 1747   LEUKOCYTESUR NEGATIVE 08/22/2016 1747   Sepsis Labs: @LABRCNTIP (procalcitonin:4,lacticidven:4) )No results found for this or any previous visit (from the past 240 hour(s)).   Radiological Exams on Admission: Dg Chest 2 View  Result Date: 05/26/2017 CLINICAL DATA:  Chest pain.  Cavitary lung disease. EXAM: CHEST  2 VIEW COMPARISON:  04/22/2017 FINDINGS: Heart size remains normal. Chronic aortic atherosclerosis. Chronic lung disease with scarring and cavity formation as seen previously. Pulmonary density appears similar. No sign of consolidation or fluid accumulation within the cystic areas. No pleural effusions. No acute bone finding. IMPRESSION: Advanced chronic lung disease with scarring and areas of cavitation. No sign of identifiable acute inflammation. A would certainly be possible for minor inflammatory changes to be hidden amongst the chronic densities. Electronically Signed   By: Nelson Chimes M.D.   On: 05/26/2017 13:10    EKG: Independently reviewed. Atrial flutter w/ 2:1 block vs afib   Assessment/Plan Active Problems:    Atrial fibrillation (HCC)   1-Atrial fibrillation w/ RVR -new onset  -CHADs VASc score 4-5  -  noted elevated trop at 0.04  -start on treatment dose lovenox for now  -IV dilt gtt -pending formal consultation w/ cardiology Jory Sims NP aware)  2-NSTEMI  -likely secondary to above  -no active CP  -treatment dose lovenox  -trend trop  -follow   3-ILD/Chronic resp failure  -noted fibrotic lung changes on CXR  -wears 2L [place] chronically  -intermittent hypoxia- chronic issue -supplement O2 prn  -no active SOB  -cont home regimen  -follow   4-Psych  -mood stable  -cont home regimen ]  FENGI -heart healthy carb modified diet   DVT prophylaxis: treatment dose lovenox  Code Status: Full   Family Communication: Caregiver at bedside  Disposition Plan: Pending further evaluation. Anticipate >2 MN stay   Consults called: Cards aware   Admission status: Inpt     Deneise Lever MD [facility] Pager [phone]  If 7PM-7AM, please contact night-coverage [email or web address] Password [facility]  05/26/2017, 4:54 PM

## 2017-05-26 NOTE — Progress Notes (Signed)
   Subjective:    Patient ID: [name], female    DOB: 27-Feb-1929, 83 y.o.   MRN: [id number]  HPI  Patient is here today to follow up on being weak, heart fluttering, HR up and down all week end long.Having headaches. She is taking Tylenol prn.  The caretaker states that pulse oximetry at times shows a heart rate in the 120s other times her heart rate is in the 50s and 60s.  This irregularity in her rhythm as well as weakness and some shortness of breath has been present over the past couple days  This patient has a long-standing history of chronic health issues including a cavitary lesion of the chest that was being followed by Dr. Luan Pulling.  It was not felt to be cancer.  Patient also has history of bowel obstruction with previous surgeries and colostomy  In addition to this her weight typically stays around 77 pound over the past year.  She is on thyroid medicine previous lab work 5 months ago was normal. Results for orders placed or performed in visit on 05/26/17  POCT hemoglobin  Result Value Ref Range   Hemoglobin 13.3 12.2 - 16.2 g/dL    Review of Systems Relates fatigue tiredness tachycardia denies chest tightness pressure pain appetite fair no fevers    Objective:   Physical Exam Neck no masses heart mildly tachycardic lungs are clear no crackles pulse strong but tachycardic  extremities no edema  EKG shows ST segment depression in the inferior leads concerning for subendocardial ischemia versus infarction in evolution     Assessment & Plan:  Mild tachycardia EKG shows ischemic changes patient needs ER evaluation and probable enzymes Patient will also need follow-up thyroid testing as well  ER MD spoke with Triage spoke with Family will be bringing patient directly over there now

## 2017-05-26 NOTE — ED Provider Notes (Signed)
[facility] EMERGENCY DEPARTMENT Provider Note   CSN: [id number] Arrival date & time: 05/26/17  1214     History   Chief Complaint Chief Complaint  Patient presents with  . Palpitations    HPI Kwanza Cancelliere Godden is a 83 y.o. female.  HPI Patient with several days of palpitations.  States heart rate ranges from 50s to low 100s.  Patient has endorsed fatigue and generalized weakness.  Denies chest pain or abdominal pain.  No blood or melanotic stools.  No fever or chills.  No recent change in patient's medications.  Was seen by her primary physician and referred to the emergency department for evaluation. Past Medical History:  Diagnosis Date  . Anemia   . 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 Chronic abdominal pain   . Chronic neck and back pain   . COPD (chronic obstructive pulmonary disease) ([place])   . GAD (generalized anxiety disorder)   . Gastroesophageal reflux disease   . Gastroparesis   . IBS (irritable bowel syndrome)   . Lung mass   . Migraines   . Osteoporosis   . 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 Renal insufficiency   . S/P colostomy ([place])   . Tachycardia     Patient Active Problem List   Diagnosis Date Noted  . Aortic atherosclerosis ([place]) 04/30/2017  . Depression with anxiety 06/10/2016  . Pubic ramus fracture, left, closed, initial encounter ([place]) 06/10/2016  . Pubic ramus fracture, right, closed, initial encounter ([place]) 06/10/2016  . COPD (chronic obstructive pulmonary disease) ([place]) 06/10/2016  . Hypertension 06/10/2016  . Diabetes mellitus ([place]) 06/10/2016  . Hyponatremia 06/10/2016  . Leukocytosis 06/10/2016  . Fall at home 06/10/2016  . Pelvic fracture ([place]) 06/10/2016  . Closed bilateral fracture of pubic rami ([place])   . Fall   . Cachexia ([place]) 04/23/2016  . Frailty 01/23/2016  . Abdominal pain 01/01/2016  . Acute respiratory failure with hypoxia ([place]) 12/20/2015  . Dyspnea 07/25/2015  . Adult failure to thrive 05/30/2015  . Cavitary lung disease  03/15/2015  . Hemoptysis 01/15/2015  . Pernicious anemia 09/13/2014  . Prediabetes 12/17/2013  . Hypothyroidism 10/04/2013  . Muscle cramps 05/03/2013  . Lung mass 03/17/2013  . Chronic headaches 11/11/2012  . Weight loss 09/21/2012  . Diarrhea 05/11/2012  . Gastroparesis 11/26/2011  . Flatulence 11/26/2011  . Osteoporosis 11/26/2011  . Ileostomy in place [facility]) 11/26/2011  . SCIATICA 08/24/2007    Past Surgical History:  Procedure Laterality Date  . ABDOMINAL HYSTERECTOMY    . APPENDECTOMY    . CHOLECYSTECTOMY    . COLOSTOMY    . ESOPHAGOGASTRODUODENOSCOPY    . PARTIAL GASTRECTOMY  1967   3/4 gastric resection for bleeding ulcers  . TONSILLECTOMY      OB History    Gravida Para Term Preterm AB Living             3   SAB TAB Ectopic Multiple Live Births                   Home Medications    Prior to Admission medications   Medication Sig Start Date End Date Taking? Authorizing Provider  acetaminophen (TYLENOL) 325 MG tablet Take 2 tablets (650 mg total) by mouth every 6 (six) hours as needed for mild pain or headache (or Fever >/= 101). 12/22/15  Yes Samuella Cota, MD  citalopram (CELEXA) 10 MG tablet TAKE (1) TABLET BY MOUTH ONCE DAILY. 01/20/17  Yes Kathyrn Drown, MD  diazepam (VALIUM)  2 MG tablet Take 1 tablets twice a daily as needed. Take sparingly (Prescription must last 30 days) 03/06/17  Yes Luking, Elayne Snare, MD  Doxylamine-DM (ROBITUSSIN NIGHTTIME COUGH DM) 12.5-30 MG/10ML LIQD Take 5 mLs by mouth daily as needed (for cough). Patient taked 1 teaspoon as needed.    Yes [provider]  ipratropium (ATROVENT HFA) 17 MCG/ACT inhaler 2 PUFFS FOUR TIMES DAILY. Patient taking differently: Inhale 2 puffs into the lungs 4 (four) times daily.  09/20/16  Yes Luking, Scott A, MD  ipratropium (ATROVENT) 0.03 % nasal spray Place 2 sprays into both nostrils every 12 (twelve) hours. 04/29/17  Yes Luking, Elayne Snare, MD  levothyroxine (SYNTHROID, LEVOTHROID) 25 MCG  tablet TAKE 2 TABLETS BY MOUTH ON MONDAY AND FRIDAY. & 1 TABLET ALL OTHER DAYS 05/08/17  Yes Luking, Scott A, MD  loperamide (IMODIUM) 2 MG capsule TAKE (1) CAPSULE THREE TIMES DAILY BEFORE MEALS. 02/07/17  Yes Rehman, Mechele Dawley, MD  Melatonin 5 MG CAPS Take 5 mg by mouth at bedtime.   Yes [provider]  memantine (NAMENDA) 10 MG tablet TAKE 1 TABLET BY MOUTH TWICE DAILY. 02/20/17  Yes Luking, Scott A, MD  metoprolol tartrate (LOPRESSOR) 25 MG tablet TAKE 1/2 TABLET TWICE DAILY FOR HIGH BLOOD PRESSURE. 01/20/17  Yes Kathyrn Drown, MD  mupirocin ointment (BACTROBAN) 2 % Apply to affected area 2 times daily 05/15/17 05/15/18 Yes Luking, Elayne Snare, MD  OXYGEN Inhale into the lungs. 2 liters -Nasal [place]   Yes [provider]  potassium chloride (K-DUR) 10 MEQ tablet TAKE (1) TABLET BY MOUTH ONCE DAILY. 05/08/17  Yes Luking, Elayne Snare, MD  Simethicone (GAS RELIEF PO) Take 1 tablet by mouth daily as needed (for gas relief).    Yes [provider]  venlafaxine XR (EFFEXOR-XR) 37.5 MG 24 hr capsule TAKE 1 CAPSULE BY MOUTH ONCE DAILY WITH BREAKFAST 05/08/17  Yes Luking, Elayne Snare, MD  chlorpheniramine-HYDROcodone (TUSSIONEX PENNKINETIC ER) 10-8 MG/5ML SUER Take 5 mLs by mouth every 12 (twelve) hours as needed for cough. Patient not taking: Reported on 05/26/2017 03/06/17   Kathyrn Drown, MD  lipase/protease/amylase (CREON) 12000 units CPEP capsule TAKE 2 CAPSULES BY MOUTH 3 TIMES DAILY WITH MEALS. TAKE 1 CAPSULE TWICE DAILY WITH SNACKS Patient not taking: Reported on 05/26/2017 04/23/17   Rogene Houston, MD  venlafaxine XR (EFFEXOR-XR) 75 MG 24 hr capsule TAKE 1 CAPSULE ONCE DAILY WITH BREAKFAST. 12/30/16   Kathyrn Drown, MD    Family History Family History  Problem Relation Age of Onset  . Stroke Mother   . Stroke Father   . Diabetes Sister   . Diabetes Brother   . Stroke Brother   . Cancer Sister        unknown type  . Diabetes Sister   . Emphysema Sister   . Diabetes  Brother   . Stroke Brother   . Diabetes Son   . Irritable bowel syndrome Son   . Diabetes Son   . Hypertension Son     Social History Social History   Tobacco Use  . Smoking status: Never Smoker  . Smokeless tobacco: Never Used  Substance Use Topics  . Alcohol use: No    Alcohol/week: 0.0 oz  . Drug use: No     Allergies   Levaquin [levofloxacin]; Aspirin; Tetanus toxoid; Zolpidem tartrate; and Fosamax [alendronate sodium]   Review of Systems Review of Systems  Constitutional: Positive for fatigue. Negative for chills and fever.  Eyes:  Negative for visual disturbance.  Respiratory: Negative for cough and shortness of breath.   Cardiovascular: Positive for palpitations. Negative for chest pain and leg swelling.  Gastrointestinal: Negative for abdominal pain, blood in stool, diarrhea, nausea and vomiting.  Musculoskeletal: Negative for back pain, myalgias, neck pain and neck stiffness.  Skin: Negative for rash and wound.  Neurological: Positive for weakness. Negative for dizziness, light-headedness, numbness and headaches.  All other systems reviewed and are negative.    Physical Exam Updated Vital Signs BP (!) 143/93   Pulse 99   Temp 97.8 F (36.6 C)   Resp (!) 26   Ht 4\' 11"  (1.499 m)   Wt 34.9 kg (77 lb)   SpO2 (!) 81%   BMI 15.55 kg/m   Physical Exam  Constitutional: She is oriented to person, place, and time. She appears well-developed. No distress.  Frail-appearing  HENT:  Head: Normocephalic and atraumatic.  Mouth/Throat: Oropharynx is clear and moist. No oropharyngeal exudate.  Eyes: EOM are normal. Pupils are equal, round, and reactive to light.  Neck: Normal range of motion. Neck supple.  Cardiovascular: Regular rhythm.  Tachycardia  Pulmonary/Chest: Effort normal.  Rhonchi throughout.  No respiratory distress.  Abdominal: Soft. Bowel sounds are normal. There is no tenderness. There is no rebound and no guarding.  Musculoskeletal: Normal range  of motion. She exhibits no edema or tenderness.  No lower extremity swelling or asymmetry.  Neurological: She is alert and oriented to person, place, and time.  Moving all extremity's without focal deficit.  Sensation fully intact.  Skin: Skin is warm and dry. Capillary refill takes less than 2 seconds. No rash noted. No erythema.  Psychiatric: She has a normal mood and affect. Her behavior is normal.  Nursing note and vitals reviewed.    ED Treatments / Results  Labs (all labs ordered are listed, but only abnormal results are displayed) Labs Reviewed  CBC - Abnormal; Notable for the following components:      Result Value   WBC 13.4 (*)    MCV 102.1 (*)    All other components within normal limits  TROPONIN I - Abnormal; Notable for the following components:   Troponin I 0.04 (*)    All other components within normal limits  COMPREHENSIVE METABOLIC PANEL - Abnormal; Notable for the following components:   Glucose, Bld 115 (*)    Total Protein 8.5 (*)    ALT 12 (*)    GFR calc non Af Amer 50 (*)    GFR calc Af Amer 58 (*)    All other components within normal limits  TSH  T4, FREE  URINALYSIS, ROUTINE W REFLEX MICROSCOPIC    EKG   Radiology Dg Chest 2 View  Result Date: 05/26/2017 CLINICAL DATA:  Chest pain.  Cavitary lung disease. EXAM: CHEST  2 VIEW COMPARISON:  04/22/2017 FINDINGS: Heart size remains normal. Chronic aortic atherosclerosis. Chronic lung disease with scarring and cavity formation as seen previously. Pulmonary density appears similar. No sign of consolidation or fluid accumulation within the cystic areas. No pleural effusions. No acute bone finding. IMPRESSION: Advanced chronic lung disease with scarring and areas of cavitation. No sign of identifiable acute inflammation. A would certainly be possible for minor inflammatory changes to be hidden amongst the chronic densities. Electronically Signed   By: Nelson Chimes M.D.   On: 05/26/2017 13:10     Procedures Procedures (including critical care time)  Medications Ordered in ED Medications  sodium chloride 0.9 % bolus 500 mL (  0 mLs Intravenous Stopped 05/26/17 1609)  diltiazem (CARDIZEM) 100 mg in dextrose 5% 127mL (1 mg/mL) infusion (7.5 mg/hr Intravenous Rate/Dose Change 05/26/17 1557)     Initial Impression / Assessment and Plan / ED Course  I have reviewed the triage vital signs and the nursing notes.  Pertinent labs & imaging results that were available during my care of the patient were reviewed by me and considered in my medical decision making (see chart for details).    Patient with no hypoxia with normal waveform.  Patient is maintaining saturations in the high 90s with normal waveform.  Irregularly irregular heart rate with tachycardia into the 160s.  Started on Cardizem drip.  Patient continues to deny any chest pain.  Troponin is mildly elevated.  Discussed with Dr. Ernestina Patches who will see patient in the emergency department and admit.  Discussed with Dr. Bronson Ing who will consult on patient   Final Clinical Impressions(s) / ED Diagnoses   Final diagnoses:  Atrial fibrillation with [facility])    ED Discharge Orders    None       Julianne Rice, MD 05/26/17 1636

## 2017-05-27 ENCOUNTER — Inpatient Hospital Stay (HOSPITAL_COMMUNITY): Payer: Medicare Other

## 2017-05-27 DIAGNOSIS — I340 Nonrheumatic mitral (valve) insufficiency: Secondary | ICD-10-CM

## 2017-05-27 DIAGNOSIS — E039 Hypothyroidism, unspecified: Secondary | ICD-10-CM

## 2017-05-27 DIAGNOSIS — J849 Interstitial pulmonary disease, unspecified: Secondary | ICD-10-CM | POA: Diagnosis present

## 2017-05-27 DIAGNOSIS — I361 Nonrheumatic tricuspid (valve) insufficiency: Secondary | ICD-10-CM

## 2017-05-27 DIAGNOSIS — J9611 Chronic respiratory failure with hypoxia: Secondary | ICD-10-CM | POA: Diagnosis present

## 2017-05-27 LAB — BASIC METABOLIC PANEL
Anion gap: 6 (ref 5–15)
BUN: 10 mg/dL (ref 6–20)
CHLORIDE: 102 mmol/L (ref 101–111)
CO2: 30 mmol/L (ref 22–32)
Calcium: 8.5 mg/dL — ABNORMAL LOW (ref 8.9–10.3)
Creatinine, Ser: 0.88 mg/dL (ref 0.44–1.00)
GFR calc non Af Amer: 58 mL/min — ABNORMAL LOW (ref >60)
Glucose, Bld: 110 mg/dL — ABNORMAL HIGH (ref 65–99)
POTASSIUM: 3.6 mmol/L (ref 3.5–5.1)
SODIUM: 138 mmol/L (ref 135–145)

## 2017-05-27 LAB — ECHOCARDIOGRAM COMPLETE
HEIGHTINCHES: 59 in
Weight: 1248.69 oz

## 2017-05-27 LAB — CBC
HEMATOCRIT: 37.7 % (ref 36.0–46.0)
HEMOGLOBIN: 12 g/dL (ref 12.0–15.0)
MCH: 31.9 pg (ref 26.0–34.0)
MCHC: 31.8 g/dL (ref 30.0–36.0)
MCV: 100.3 fL — AB (ref 78.0–100.0)
PLATELETS: 325 10*3/uL (ref 150–400)
RBC: 3.76 MIL/uL — AB (ref 3.87–5.11)
RDW: 14.5 % (ref 11.5–15.5)
WBC: 10.4 10*3/uL (ref 4.0–10.5)

## 2017-05-27 LAB — TROPONIN I
Troponin I: 0.04 ng/mL (ref <0.03)
Troponin I: 0.04 ng/mL (ref <0.03)

## 2017-05-27 LAB — HEMOGLOBIN A1C
Hgb A1c MFr Bld: 6.9 % — ABNORMAL HIGH (ref 4.8–5.6)
Mean Plasma Glucose: 151.33 mg/dL

## 2017-05-27 MED ORDER — LEVOTHYROXINE SODIUM 50 MCG PO TABS
50.0000 ug | ORAL_TABLET | ORAL | Status: DC
Start: 2017-05-30 — End: 2017-06-04
  Administered 2017-05-30 – 2017-06-02 (×2): 50 ug via ORAL
  Filled 2017-05-27 (×2): qty 2

## 2017-05-27 MED ORDER — LEVOTHYROXINE SODIUM 25 MCG PO TABS
25.0000 ug | ORAL_TABLET | Freq: Every day | ORAL | Status: DC
Start: 2017-05-28 — End: 2017-05-27

## 2017-05-27 MED ORDER — RIVAROXABAN 15 MG PO TABS
15.0000 mg | ORAL_TABLET | Freq: Every day | ORAL | Status: DC
  Administered 2017-05-27 – 2017-06-01 (×6): 15 mg via ORAL
  Filled 2017-05-27 (×7): qty 1

## 2017-05-27 MED ORDER — DILTIAZEM HCL 60 MG PO TABS
60.0000 mg | ORAL_TABLET | Freq: Three times a day (TID) | ORAL | Status: DC
  Administered 2017-05-27 – 2017-05-28 (×3): 60 mg via ORAL
  Filled 2017-05-27 (×3): qty 1

## 2017-05-27 MED ORDER — LOPERAMIDE HCL 2 MG PO CAPS
2.0000 mg | ORAL_CAPSULE | Freq: Three times a day (TID) | ORAL | Status: DC
  Administered 2017-05-27 – 2017-06-04 (×25): 2 mg via ORAL
  Filled 2017-05-27 (×25): qty 1

## 2017-05-27 MED ORDER — LEVOTHYROXINE SODIUM 25 MCG PO TABS
25.0000 ug | ORAL_TABLET | ORAL | Status: DC
  Administered 2017-05-28 – 2017-06-04 (×5): 25 ug via ORAL
  Filled 2017-05-27 (×5): qty 1

## 2017-05-27 MED ORDER — IPRATROPIUM BROMIDE 0.02 % IN SOLN: 0.5000 mg | RESPIRATORY_TRACT | Status: DC | PRN

## 2017-05-27 NOTE — Progress Notes (Signed)
Pt states ostomy bag is changed every Tuesday and Friday. Ostomy bag changed per pt request. Stoma cleansed and red with no swelling. Periostomy area clean, dry, and intact.

## 2017-05-27 NOTE — Progress Notes (Signed)
BP 161/97 HR 110. BP rechecked: 144/88 HR 118 Metoprolol and Cardizem due at 2200. Dr. Roderic Palau notified.

## 2017-05-27 NOTE — Progress Notes (Signed)
*  PRELIMINARY RESULTS* Echocardiogram 2D Echocardiogram has been performed.  Leavy Cella 05/27/2017, 3:30 PM

## 2017-05-27 NOTE — Progress Notes (Signed)
Progress Note  Patient Name: [name] Date of Encounter: 05/27/2017  Primary Cardiologist: Dr. Bronson Ing (new)  Subjective   She says she is feeling better and stronger when compared to yesterday when I evaluated her in the ED.  Inpatient Medications    Scheduled Meds: . citalopram  10 mg Oral Daily  . enoxaparin (LOVENOX) injection  35 mg Subcutaneous Q24H  . ipratropium  2 spray Each Nare Q12H  . ipratropium  0.5 mg Nebulization QID  . memantine  10 mg Oral BID  . metoprolol tartrate  12.5 mg Oral BID  . potassium chloride  10 mEq Oral Daily  . venlafaxine XR  37.5 mg Oral Q breakfast   Continuous Infusions: . diltiazem (CARDIZEM) infusion 10 mg/hr (05/27/17 0843)   PRN Meds: acetaminophen, diazepam, ondansetron (ZOFRAN) IV   Vital Signs    Vitals:   05/27/17 0000 05/27/17 0400 05/27/17 0500 05/27/17 0906  BP:      Pulse:      Resp:      Temp: 98.2 F (36.8 C) 98.3 F (36.8 C)    TempSrc: Oral Oral    SpO2:    99%  Weight:   78 lb 0.7 oz (35.4 kg)   Height:        Intake/Output Summary (Last 24 hours) at 05/27/2017 0950 Last data filed at 05/27/2017 0277 Gross per 24 hour  Intake 677.62 ml  Output 875 ml  Net -197.38 ml   Filed Weights   05/26/17 1224 05/26/17 1859 05/27/17 0500  Weight: 77 lb (34.9 kg) 78 lb 0.7 oz (35.4 kg) 78 lb 0.7 oz (35.4 kg)    Telemetry    Rate controlled atrial fibrillation and flutter with PVCs- Personally Reviewed   Physical Exam   GEN: Elderly chronically ill appearing woman in no acute distress  Neck: No JVD Cardiac: Regular rate, irregular rhythm, no murmurs, rubs, or gallops.  Respiratory: Poor air movement with crackles in the left upper and middle lung fields and faint end expiratory wheezes on the right GI: Soft, nontender, non-distended  MS: No edema; No deformity. Neuro:  Nonfocal  Psych: Normal affect   Labs    Chemistry Recent Labs  Lab 05/26/17 1317 05/27/17 0419  NA 141 138  K 3.9  3.6  CL 102 102  CO2 30 30  GLUCOSE 115* 110*  BUN 11 10  CREATININE 0.99 0.88  CALCIUM 9.4 8.5*  PROT 8.5*  --   ALBUMIN 4.0  --   AST 26  --   ALT 12*  --   ALKPHOS 103  --   BILITOT 0.6  --   GFRNONAA 50* 58*  GFRAA 58* >60  ANIONGAP 9 6     Hematology Recent Labs  Lab 05/26/17 1126 05/26/17 1315 05/27/17 0419  WBC  --  13.4* 10.4  RBC  --  4.28 3.76*  HGB 13.3 13.9 12.0  HCT  --  43.7 37.7  MCV  --  102.1* 100.3*  MCH  --  32.5 31.9  MCHC  --  31.8 31.8  RDW  --  14.7 14.5  PLT  --  355 325    Cardiac Enzymes Recent Labs  Lab 05/26/17 1315 05/26/17 1703 05/26/17 2311 05/27/17 0419  TROPONINI 0.04* 0.04* 0.04* 0.04*   No results for input(s): TROPIPOC in the last 168 hours.   BNP Recent Labs  Lab 05/26/17 1703  BNP 1,803.0*     DDimer No results for input(s): DDIMER in the last  168 hours.   Radiology    Dg Chest 2 View  Result Date: 05/26/2017 CLINICAL DATA:  Chest pain.  Cavitary lung disease. EXAM: CHEST  2 VIEW COMPARISON:  04/22/2017 FINDINGS: Heart size remains normal. Chronic aortic atherosclerosis. Chronic lung disease with scarring and cavity formation as seen previously. Pulmonary density appears similar. No sign of consolidation or fluid accumulation within the cystic areas. No pleural effusions. No acute bone finding. IMPRESSION: Advanced chronic lung disease with scarring and areas of cavitation. No sign of identifiable acute inflammation. A would certainly be possible for minor inflammatory changes to be hidden amongst the chronic densities. Electronically Signed   By: Nelson Chimes M.D.   On: 05/26/2017 13:10    Cardiac Studies   None  Patient Profile     83 y.o. female with chronic interstitial lung disease admitted with rapid atrial flutter, abnormal ECG, and troponin elevation.  Assessment & Plan    1.  Rapid atrial flutter and fibrillation: Currently on IV diltiazem drip at 10 mg/h.  She is on full strength Lovenox.  Heart  rate is controlled.  I will switch IV diltiazem to short-acting oral diltiazem and monitor heart rate.  I will discontinue Lovenox in favor of Xarelto for thromboembolic risk reduction. Now that her heart rate is controlled, will obtain an echocardiogram to evaluate cardiac structure and function.  2.  Troponin elevation: This represents a nonspecific elevation.  She denies chest pain.  She has ruled out for an acute coronary syndrome.  3.  Chronic interstitial lung disease: This appears to be stable and is followed by pulmonary.   For questions or updates, please contact [place] Please consult [email or web address] for contact info under Cardiology/STEMI.      Signed, Kate Sable, MD  05/27/2017, 9:50 AM

## 2017-05-27 NOTE — Progress Notes (Signed)
PROGRESS NOTE    Threasa Kinch Lave  VZD:[id number] DOB: 01-30-1929 DOA: 05/26/2017 PCP: Kathyrn Drown, MD    Brief Narrative:  83 year old female with a history of cavitary lung disease, hypothyroidism, admitted to the hospital with rapid atrial fibrillation.  She had increasing palpitations and increased work of breathing for 4-5 days prior to admission.  She was admitted to the stepdown unit, started on a Cardizem infusion.  Cardiology following.   Assessment & Plan:   Active Problems:   Atrial fibrillation ([place])   1. Atrial fibrillation rapid ventricular response.  She is currently rate controlled on intravenous diltiazem infusion.  Since heart rate is controlled, will transition to oral calcium channel blockers.  She is anticoagulated with therapeutic Lovenox.  She has been transitioned to Xarelto.  Echocardiogram is been ordered. 2. Elevated troponin.  Appears to be nonspecific.  No complaints of chest pain.  She is ruled out for ACS. 3. Interstitial lung disease/chronic respiratory failure.  Noted to have fibrotic changes/cavitary lesions on imaging for over a year now.  Chronically wears 2 L of oxygen.  Respiratory status appears to be at baseline.  Will refer her to pulmonology to be followed as an outpatient. 4. Hypothyroidism.  Continue on home dose of Synthroid   DVT prophylaxis: Therapeutic Lovenox Code Status: Full code Family Communication: Discussed with patient and family at the bedside Disposition Plan: Discharge home once improved   Consultants:   Cardiology  Procedures:     Antimicrobials:       Subjective: No shortness of breath or chest pain.  No palpitations.  Objective: Vitals:   05/27/17 0000 05/27/17 0400 05/27/17 0500 05/27/17 0906  BP:      Pulse:      Resp:      Temp: 98.2 F (36.8 C) 98.3 F (36.8 C)    TempSrc: Oral Oral    SpO2:    99%  Weight:   35.4 kg (78 lb 0.7 oz)   Height:        Intake/Output Summary (Last 24 hours) at  05/27/2017 1054 Last data filed at 05/27/2017 0938 Gross per 24 hour  Intake 677.62 ml  Output 875 ml  Net -197.38 ml   Filed Weights   05/26/17 1224 05/26/17 1859 05/27/17 0500  Weight: 34.9 kg (77 lb) 35.4 kg (78 lb 0.7 oz) 35.4 kg (78 lb 0.7 oz)    Examination:  General exam: Appears calm and comfortable  Respiratory system: Clear to auscultation. Respiratory effort normal. Cardiovascular system: S1 & S2 heard, irregular. No JVD, murmurs, rubs, gallops or clicks. No pedal edema. Gastrointestinal system: Abdomen is nondistended, soft and nontender. No organomegaly or masses felt. Normal bowel sounds heard.  Colostomy right lower quadrant Central nervous system: Alert and oriented. No focal neurological deficits. Extremities: Symmetric 5 x 5 power. Skin: No rashes, lesions or ulcers Psychiatry: Judgement and insight appear normal. Mood & affect appropriate.     Data Reviewed: I have personally reviewed following labs and imaging studies  CBC: Recent Labs  Lab 05/26/17 1126 05/26/17 1315 05/27/17 0419  WBC  --  13.4* 10.4  HGB 13.3 13.9 12.0  HCT  --  43.7 37.7  MCV  --  102.1* 100.3*  PLT  --  355 295   Basic Metabolic Panel: Recent Labs  Lab 05/26/17 1317 05/27/17 0419  NA 141 138  K 3.9 3.6  CL 102 102  CO2 30 30  GLUCOSE 115* 110*  BUN 11 10  CREATININE 0.99 0.88  CALCIUM 9.4 8.5*   GFR: Estimated Creatinine Clearance: 25.6 mL/min (by C-G formula based on SCr of 0.88 mg/dL). Liver Function Tests: Recent Labs  Lab 05/26/17 1317  AST 26  ALT 12*  ALKPHOS 103  BILITOT 0.6  PROT 8.5*  ALBUMIN 4.0   No results for input(s): LIPASE, AMYLASE in the last 168 hours. No results for input(s): AMMONIA in the last 168 hours. Coagulation Profile: No results for input(s): INR, PROTIME in the last 168 hours. Cardiac Enzymes: Recent Labs  Lab 05/26/17 1315 05/26/17 1703 05/26/17 2311 05/27/17 0419  TROPONINI 0.04* 0.04* 0.04* 0.04*   BNP (last 3  results) No results for input(s): PROBNP in the last 8760 hours. HbA1C: No results for input(s): HGBA1C in the last 72 hours. CBG: No results for input(s): GLUCAP in the last 168 hours. Lipid Profile: No results for input(s): CHOL, HDL, LDLCALC, TRIG, CHOLHDL, LDLDIRECT in the last 72 hours. Thyroid Function Tests: Recent Labs    05/26/17 1315 05/26/17 1317  TSH  --  1.308  FREET4 0.86  --    Anemia Panel: No results for input(s): VITAMINB12, FOLATE, FERRITIN, TIBC, IRON, RETICCTPCT in the last 72 hours. Sepsis Labs: No results for input(s): PROCALCITON, LATICACIDVEN in the last 168 hours.  Recent Results (from the past 240 hour(s))  MRSA PCR Screening     Status: None   Collection Time: 05/26/17  6:39 PM  Result Value Ref Range Status   MRSA by PCR NEGATIVE NEGATIVE Final    Comment:        The GeneXpert MRSA Assay (FDA approved for NASAL specimens only), is one component of a comprehensive MRSA colonization surveillance program. It is not intended to diagnose MRSA infection nor to guide or monitor treatment for MRSA infections.          Radiology Studies: Dg Chest 2 View  Result Date: 05/26/2017 CLINICAL DATA:  Chest pain.  Cavitary lung disease. EXAM: CHEST  2 VIEW COMPARISON:  04/22/2017 FINDINGS: Heart size remains normal. Chronic aortic atherosclerosis. Chronic lung disease with scarring and cavity formation as seen previously. Pulmonary density appears similar. No sign of consolidation or fluid accumulation within the cystic areas. No pleural effusions. No acute bone finding. IMPRESSION: Advanced chronic lung disease with scarring and areas of cavitation. No sign of identifiable acute inflammation. A would certainly be possible for minor inflammatory changes to be hidden amongst the chronic densities. Electronically Signed   By: Nelson Chimes M.D.   On: 05/26/2017 13:10        Scheduled Meds: . citalopram  10 mg Oral Daily  . diltiazem  60 mg Oral Q8H  .  ipratropium  2 spray Each Nare Q12H  . ipratropium  0.5 mg Nebulization QID  . memantine  10 mg Oral BID  . metoprolol tartrate  12.5 mg Oral BID  . potassium chloride  10 mEq Oral Daily  . rivaroxaban  15 mg Oral Q supper  . venlafaxine XR  37.5 mg Oral Q breakfast   Continuous Infusions:   LOS: 1 day    Time spent: 10mins    Artemis Koller, MD [facility] Pager [phone]  If 7PM-7AM, please contact night-coverage [email or web address] Password [facility] 05/27/2017, 10:54 AM

## 2017-05-27 NOTE — Progress Notes (Signed)
ANTICOAGULATION CONSULT NOTE - Initial Consult  Pharmacy Consult for xarelto Indication: atrial fibrillation  Allergies  Allergen Reactions  . Levaquin [Levofloxacin] Other (See Comments)    Patient states that she became weak and extremely tired. Also, it made her real dizzy.  . Aspirin Nausea And Vomiting    Nausea/vomiting blood  . Tetanus Toxoid Hives and Swelling  . Zolpidem Tartrate     Not in right state of mind  . Fosamax [Alendronate Sodium] Other (See Comments)    Severe gastritis reflux.    Patient Measurements: Height: 4\' 11"  (149.9 cm) Weight: 78 lb 0.7 oz (35.4 kg) IBW/kg (Calculated) : 43.2  Vital Signs: Temp: 98.3 F (36.8 C) (11/13 0400) Temp Source: Oral (11/13 0400)  Labs: Recent Labs    05/26/17 1315 05/26/17 1317 05/26/17 1703 05/26/17 2311 05/27/17 0419  HGB 13.9  --   --   --  12.0  HCT 43.7  --   --   --  37.7  PLT 355  --   --   --  325  CREATININE  --  0.99  --   --  0.88  TROPONINI 0.04*  --  0.04* 0.04* 0.04*    Estimated Creatinine Clearance: 25.6 mL/min (by C-G formula based on SCr of 0.88 mg/dL).   Medical History: Past Medical History:  Diagnosis Date  . Anemia   . 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 Chronic abdominal pain   . Chronic neck and back pain   . COPD (chronic obstructive pulmonary disease) ([place])   . GAD (generalized anxiety disorder)   . Gastroesophageal reflux disease   . Gastroparesis   . IBS (irritable bowel syndrome)   . Lung mass   . Migraines   . Osteoporosis   . 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 Renal insufficiency   . S/P colostomy ([place])   . Tachycardia     Medications:  See med rec  Assessment: 83 yo female admitted with palpitations.  Initially started on lovenox, now change to xarelto   Goal of Therapy:  Monitor platelets by anticoagulation protocol: Yes   Plan:  Xarelto 15 mg po daily with supper Monitor for s/s of bleeding Xarelto education  Excell Seltzer, Pharm D Clinical  Pharmacist 05/27/2017,10:37 AM

## 2017-05-28 DIAGNOSIS — I4891 Unspecified atrial fibrillation: Principal | ICD-10-CM

## 2017-05-28 LAB — CBC
HEMATOCRIT: 36.7 % (ref 36.0–46.0)
Hemoglobin: 11.8 g/dL — ABNORMAL LOW (ref 12.0–15.0)
MCH: 32.2 pg (ref 26.0–34.0)
MCHC: 32.2 g/dL (ref 30.0–36.0)
MCV: 100 fL (ref 78.0–100.0)
PLATELETS: 320 10*3/uL (ref 150–400)
RBC: 3.67 MIL/uL — ABNORMAL LOW (ref 3.87–5.11)
RDW: 14.3 % (ref 11.5–15.5)
WBC: 11 10*3/uL — ABNORMAL HIGH (ref 4.0–10.5)

## 2017-05-28 MED ORDER — DILTIAZEM HCL 60 MG PO TABS
120.0000 mg | ORAL_TABLET | Freq: Three times a day (TID) | ORAL | Status: DC
  Administered 2017-05-28 – 2017-05-29 (×3): 120 mg via ORAL
  Filled 2017-05-28 (×3): qty 2

## 2017-05-28 MED ORDER — DILTIAZEM HCL 60 MG PO TABS
60.0000 mg | ORAL_TABLET | Freq: Once | ORAL | Status: AC
  Administered 2017-05-28: 60 mg via ORAL
  Filled 2017-05-28: qty 1

## 2017-05-28 NOTE — Discharge Instructions (Signed)
Information on my medicine - XARELTO (Rivaroxaban)  This medication education was reviewed with me or my healthcare representative as part of my discharge preparation.  The pharmacist that spoke with me during my hospital stay was:  Pricilla Larsson, [facility]  Why was Xarelto prescribed for you? Xarelto was prescribed for you to reduce the risk of a blood clot forming that can cause a stroke if you have a medical condition called atrial fibrillation (a type of irregular heartbeat).  What do you need to know about xarelto ? Take your Xarelto ONCE DAILY at the same time every day with your evening meal. If you have difficulty swallowing the tablet whole, you may crush it and mix in applesauce just prior to taking your dose.  Take Xarelto exactly as prescribed by your doctor and DO NOT stop taking Xarelto without talking to the doctor who prescribed the medication.  Stopping without other stroke prevention medication to take the place of Xarelto may increase your risk of developing a clot that causes a stroke.  Refill your prescription before you run out.  After discharge, you should have regular check-up appointments with your healthcare provider that is prescribing your Xarelto.  In the future your dose may need to be changed if your kidney function or weight changes by a significant amount.  What do you do if you miss a dose? If you are taking Xarelto ONCE DAILY and you miss a dose, take it as soon as you remember on the same day then continue your regularly scheduled once daily regimen the next day. Do not take two doses of Xarelto at the same time or on the same day.   Important Safety Information A possible side effect of Xarelto is bleeding. You should call your healthcare provider right away if you experience any of the following: ? Bleeding from an injury or your nose that does not stop. ? Unusual colored urine (red or dark brown) or unusual colored stools (red or black). ? Unusual  bruising for unknown reasons. ? A serious fall or if you hit your head (even if there is no bleeding).  Some medicines may interact with Xarelto and might increase your risk of bleeding while on Xarelto. To help avoid this, consult your healthcare provider or pharmacist prior to using any new prescription or non-prescription medications, including herbals, vitamins, non-steroidal anti-inflammatory drugs (NSAIDs) and supplements.  This website has more information on Xarelto: [email or web address]

## 2017-05-28 NOTE — Progress Notes (Signed)
Progress Note  Patient Name: [name] Date of Encounter: 05/28/2017  Primary Cardiologist: Dr. Bronson Ing (new)   Subjective   No complaints this morning.  Inpatient Medications    Scheduled Meds: . citalopram  10 mg Oral Daily  . diltiazem  60 mg Oral Q8H  . ipratropium  2 spray Each Nare Q12H  . [START ON 05/30/2017] levothyroxine  50 mcg Oral Once per day on Mon Fri   And  . levothyroxine  25 mcg Oral Once per day on Sun Tue Wed Thu Sat  . loperamide  2 mg Oral TID  . memantine  10 mg Oral BID  . metoprolol tartrate  12.5 mg Oral BID  . potassium chloride  10 mEq Oral Daily  . rivaroxaban  15 mg Oral Q supper  . venlafaxine XR  37.5 mg Oral Q breakfast   Continuous Infusions:  PRN Meds: acetaminophen, diazepam, ipratropium, ondansetron (ZOFRAN) IV   Vital Signs    Vitals:   05/28/17 0610 05/28/17 0700 05/28/17 0800 05/28/17 0900  BP: 124/89 (!) 117/95 (!) 130/109 (!) 150/129  Pulse:  (!) 48 75 (!) 148  Resp:  16 17 15   Temp:      TempSrc:      SpO2:  95% 95% 97%  Weight:      Height:        Intake/Output Summary (Last 24 hours) at 05/28/2017 0920 Last data filed at 05/27/2017 1800 Gross per 24 hour  Intake 720 ml  Output 400 ml  Net 320 ml   Filed Weights   05/26/17 1859 05/27/17 0500 05/28/17 0500  Weight: 78 lb 0.7 oz (35.4 kg) 78 lb 0.7 oz (35.4 kg) 79 lb 9.4 oz (36.1 kg)    Telemetry    Rapid atrial flutter, 150 bpm range - Personally Reviewed   Physical Exam   GEN: Elderly chronically ill appearing womanin no acute distress   Neck: No JVD Cardiac: Tachycardic, irregular, no murmurs, rubs, or gallops.  Respiratory: Poor air movement with crackles in the left upper and middle lung fields and faint end expiratory wheezes on the right (unchanged) GI: Soft, nontender, non-distended  MS: No edema; No deformity. Neuro:  Nonfocal  Psych: Normal affect   Labs    Chemistry Recent Labs  Lab 05/26/17 1317 05/27/17 0419  NA 141  138  K 3.9 3.6  CL 102 102  CO2 30 30  GLUCOSE 115* 110*  BUN 11 10  CREATININE 0.99 0.88  CALCIUM 9.4 8.5*  PROT 8.5*  --   ALBUMIN 4.0  --   AST 26  --   ALT 12*  --   ALKPHOS 103  --   BILITOT 0.6  --   GFRNONAA 50* 58*  GFRAA 58* >60  ANIONGAP 9 6     Hematology Recent Labs  Lab 05/26/17 1315 05/27/17 0419 05/28/17 0408  WBC 13.4* 10.4 11.0*  RBC 4.28 3.76* 3.67*  HGB 13.9 12.0 11.8*  HCT 43.7 37.7 36.7  MCV 102.1* 100.3* 100.0  MCH 32.5 31.9 32.2  MCHC 31.8 31.8 32.2  RDW 14.7 14.5 14.3  PLT 355 325 320    Cardiac Enzymes Recent Labs  Lab 05/26/17 1315 05/26/17 1703 05/26/17 2311 05/27/17 0419  TROPONINI 0.04* 0.04* 0.04* 0.04*   No results for input(s): TROPIPOC in the last 168 hours.   BNP Recent Labs  Lab 05/26/17 1703  BNP 1,803.0*     DDimer No results for input(s): DDIMER in the last 168 hours.  Radiology    Dg Chest 2 View  Result Date: 05/26/2017 CLINICAL DATA:  Chest pain.  Cavitary lung disease. EXAM: CHEST  2 VIEW COMPARISON:  04/22/2017 FINDINGS: Heart size remains normal. Chronic aortic atherosclerosis. Chronic lung disease with scarring and cavity formation as seen previously. Pulmonary density appears similar. No sign of consolidation or fluid accumulation within the cystic areas. No pleural effusions. No acute bone finding. IMPRESSION: Advanced chronic lung disease with scarring and areas of cavitation. No sign of identifiable acute inflammation. A would certainly be possible for minor inflammatory changes to be hidden amongst the chronic densities. Electronically Signed   By: Nelson Chimes M.D.   On: 05/26/2017 13:10    Cardiac Studies   Echocardiogram (05/27/17):  - Left ventricle: The cavity size was normal. Wall thickness was   increased in a pattern of mild LVH. Systolic function was normal.   The estimated ejection fraction was in the range of 55% to 60%.   Wall motion was normal; there were no regional wall motion    abnormalities. The study was not technically sufficient to allow   evaluation of LV diastolic dysfunction due to atrial   fibrillation. Doppler parameters are consistent with high   ventricular filling pressure. - Aortic valve: Trileaflet; mildly thickened, mildly calcified   leaflets. There was mild stenosis. There was mild regurgitation. - Mitral valve: Moderately calcified annulus. Mildly thickened   leaflets . There was mild regurgitation. - Left atrium: The atrium was mildly to moderately dilated. - Atrial septum: No defect or patent foramen ovale was identified. - Tricuspid valve: Mildly thickened leaflets. There was moderate   regurgitation. - Pulmonary arteries: PA peak pressure: 49 mm Hg (S).  Patient Profile      83 y.o. female with chronic interstitial lung disease admitted with rapid atrial flutter, abnormal ECG, and troponin elevation.   Assessment & Plan    1.  Rapid atrial flutter and fibrillation: Tachycardic on current doses of diltiazem and metoprolol. I will increase oral diltiazem to 120 mg tid. Continue Xarelto for thromboembolic risk reduction. Left atrium is mild to moderately dilated, while LV systolic function is normal.  2.  Troponin elevation: This represents a nonspecific elevation.  She denies chest pain.  She has ruled out for an acute coronary syndrome.  3.  Chronic interstitial lung disease: This appears to be stable and is followed by pulmonary.    For questions or updates, please contact [place] Please consult [email or web address] for contact info under Cardiology/STEMI.      Signed, Kate Sable, MD  05/28/2017, 9:20 AM

## 2017-05-28 NOTE — Progress Notes (Signed)
PROGRESS NOTE    [name]  BJY:[id number] DOB: 08-06-1928 DOA: 05/26/2017 PCP: Kathyrn Drown, MD    Brief Narrative:  83 year old female with a history of cavitary lung disease, hypothyroidism, admitted to the hospital with rapid atrial fibrillation.  She had increasing palpitations and increased work of breathing for 4-5 days prior to admission.  She was admitted to the stepdown unit, started on a Cardizem infusion.  Cardiology following.   Assessment & Plan:   Active Problems:   Hypothyroidism   Depression with anxiety   Atrial fibrillation (HCC)   Chronic respiratory failure with hypoxia (HCC)   Interstitial lung disease ([place])   1. Atrial fibrillation rapid ventricular response.  Patient was weaned off of intravenous Cardizem infusion, but heart rate remains elevated this morning.  She is been started on oral diltiazem which will be adjusted.  Continue on metoprolol.  She is anticoagulated with Xarelto.  Echocardiogram done with results as below. 2. Elevated troponin.  Appears to be nonspecific.  No complaints of chest pain.  She is ruled out for ACS. 3. Interstitial lung disease/chronic respiratory failure.  Noted to have fibrotic changes/cavitary lesions on imaging for over a year now.  Chronically wears 2 L of oxygen.  Respiratory status appears to be at baseline.  Will refer her to pulmonology to be followed as an outpatient. 4. Hypothyroidism.  Continue on home dose of Synthroid   DVT prophylaxis: Xarelto Code Status: Full code Family Communication: Discussed with patient and family at the bedside Disposition Plan: Discharge home once improved   Consultants:   Cardiology  Procedures:  Echo:- Left ventricle: The cavity size was normal. Wall thickness was   increased in a pattern of mild LVH. Systolic function was normal.   The estimated ejection fraction was in the range of 55% to 60%.   Wall motion was normal; there were no regional wall motion  abnormalities. The study was not technically sufficient to allow   evaluation of LV diastolic dysfunction due to atrial   fibrillation. Doppler parameters are consistent with high   ventricular filling pressure. - Aortic valve: Trileaflet; mildly thickened, mildly calcified   leaflets. There was mild stenosis. There was mild regurgitation. - Mitral valve: Moderately calcified annulus. Mildly thickened   leaflets . There was mild regurgitation. - Left atrium: The atrium was mildly to moderately dilated. - Atrial septum: No defect or patent foramen ovale was identified. - Tricuspid valve: Mildly thickened leaflets. There was moderate   regurgitation.  - Pulmonary arteries: PA peak pressure: 49 mm Hg (S).  Antimicrobials:       Subjective: No chest pain or shortness of breath.  Objective: Vitals:   05/28/17 1301 05/28/17 1400 05/28/17 1500 05/28/17 1600  BP: 138/70 116/80 120/76   Pulse: 71 79 (!) 135   Resp: 19 14 18    Temp: 98.1 F (36.7 C)   98.2 F (36.8 C)  TempSrc: Oral   Oral  SpO2: 97% 96% 96%   Weight:      Height:        Intake/Output Summary (Last 24 hours) at 05/28/2017 1813 Last data filed at 05/28/2017 1804 Gross per 24 hour  Intake 1000 ml  Output -  Net 1000 ml   Filed Weights   05/26/17 1859 05/27/17 0500 05/28/17 0500  Weight: 35.4 kg (78 lb 0.7 oz) 35.4 kg (78 lb 0.7 oz) 36.1 kg (79 lb 9.4 oz)    Examination:  General exam: Appears calm and comfortable  Respiratory system:  Clear to auscultation. Respiratory effort normal. Cardiovascular system: S1 & S2 heard, irregular. No JVD, murmurs, rubs, gallops or clicks. No pedal edema. Gastrointestinal system: Abdomen is nondistended, soft and nontender. No organomegaly or masses felt. Normal bowel sounds heard.  Colostomy right lower quadrant Central nervous system: Alert and oriented. No focal neurological deficits. Extremities: Symmetric 5 x 5 power. Skin: No rashes, lesions or ulcers Psychiatry:  Judgement and insight appear normal. Mood & affect appropriate.     Data Reviewed: I have personally reviewed following labs and imaging studies  CBC: Recent Labs  Lab 05/26/17 1126 05/26/17 1315 05/27/17 0419 05/28/17 0408  WBC  --  13.4* 10.4 11.0*  HGB 13.3 13.9 12.0 11.8*  HCT  --  43.7 37.7 36.7  MCV  --  102.1* 100.3* 100.0  PLT  --  355 325 709   Basic Metabolic Panel: Recent Labs  Lab 05/26/17 1317 05/27/17 0419  NA 141 138  K 3.9 3.6  CL 102 102  CO2 30 30  GLUCOSE 115* 110*  BUN 11 10  CREATININE 0.99 0.88  CALCIUM 9.4 8.5*   GFR: Estimated Creatinine Clearance: 26.2 mL/min (by C-G formula based on SCr of 0.88 mg/dL). Liver Function Tests: Recent Labs  Lab 05/26/17 1317  AST 26  ALT 12*  ALKPHOS 103  BILITOT 0.6  PROT 8.5*  ALBUMIN 4.0   No results for input(s): LIPASE, AMYLASE in the last 168 hours. No results for input(s): AMMONIA in the last 168 hours. Coagulation Profile: No results for input(s): INR, PROTIME in the last 168 hours. Cardiac Enzymes: Recent Labs  Lab 05/26/17 1315 05/26/17 1703 05/26/17 2311 05/27/17 0419  TROPONINI 0.04* 0.04* 0.04* 0.04*   BNP (last 3 results) No results for input(s): PROBNP in the last 8760 hours. HbA1C: Recent Labs    05/26/17 1700  HGBA1C 6.9*   CBG: No results for input(s): GLUCAP in the last 168 hours. Lipid Profile: No results for input(s): CHOL, HDL, LDLCALC, TRIG, CHOLHDL, LDLDIRECT in the last 72 hours. Thyroid Function Tests: Recent Labs    05/26/17 1315 05/26/17 1317  TSH  --  1.308  FREET4 0.86  --    Anemia Panel: No results for input(s): VITAMINB12, FOLATE, FERRITIN, TIBC, IRON, RETICCTPCT in the last 72 hours. Sepsis Labs: No results for input(s): PROCALCITON, LATICACIDVEN in the last 168 hours.  Recent Results (from the past 240 hour(s))  MRSA PCR Screening     Status: None   Collection Time: 05/26/17  6:39 PM  Result Value Ref Range Status   MRSA by PCR NEGATIVE  NEGATIVE Final    Comment:        The GeneXpert MRSA Assay (FDA approved for NASAL specimens only), is one component of a comprehensive MRSA colonization surveillance program. It is not intended to diagnose MRSA infection nor to guide or monitor treatment for MRSA infections.          Radiology Studies: No results found.      Scheduled Meds: . citalopram  10 mg Oral Daily  . diltiazem  120 mg Oral Q8H  . ipratropium  2 spray Each Nare Q12H  . [START ON 05/30/2017] levothyroxine  50 mcg Oral Once per day on Mon Fri   And  . levothyroxine  25 mcg Oral Once per day on Sun Tue Wed Thu Sat  . loperamide  2 mg Oral TID  . memantine  10 mg Oral BID  . metoprolol tartrate  12.5 mg Oral BID  . potassium  chloride  10 mEq Oral Daily  . rivaroxaban  15 mg Oral Q supper  . venlafaxine XR  37.5 mg Oral Q breakfast   Continuous Infusions:   LOS: 2 days    Time spent: 54mins    Laurie Lovejoy, MD [facility] Pager [phone]  If 7PM-7AM, please contact night-coverage [email or web address] Password [facility] 05/28/2017, 6:13 PM

## 2017-05-29 ENCOUNTER — Inpatient Hospital Stay (HOSPITAL_COMMUNITY): Payer: Medicare Other

## 2017-05-29 DIAGNOSIS — R1310 Dysphagia, unspecified: Secondary | ICD-10-CM

## 2017-05-29 DIAGNOSIS — J9611 Chronic respiratory failure with hypoxia: Secondary | ICD-10-CM

## 2017-05-29 MED ORDER — DILTIAZEM HCL ER COATED BEADS 180 MG PO CP24
180.0000 mg | ORAL_CAPSULE | Freq: Once | ORAL | Status: AC
  Administered 2017-05-29: 180 mg via ORAL
  Filled 2017-05-29: qty 1

## 2017-05-29 MED ORDER — DILTIAZEM HCL-DEXTROSE 100-5 MG/100ML-% IV SOLN (PREMIX)
5.0000 mg/h | INTRAVENOUS | Status: DC
  Administered 2017-05-29: 5 mg/h via INTRAVENOUS
  Filled 2017-05-29: qty 100

## 2017-05-29 MED ORDER — SIMETHICONE 40 MG/0.6ML PO SUSP: 40.0000 mg | Freq: Four times a day (QID) | ORAL | Status: DC

## 2017-05-29 MED ORDER — SIMETHICONE 40 MG/0.6ML PO SUSP
40.0000 mg | Freq: Four times a day (QID) | ORAL | Status: DC | PRN
  Administered 2017-05-29 – 2017-06-02 (×10): 40 mg via ORAL
  Filled 2017-05-29: qty 0.6

## 2017-05-29 MED ORDER — DILTIAZEM HCL ER COATED BEADS 180 MG PO CP24
360.0000 mg | ORAL_CAPSULE | Freq: Every day | ORAL | Status: DC
  Administered 2017-05-29 – 2017-06-04 (×7): 360 mg via ORAL
  Filled 2017-05-29 (×7): qty 2

## 2017-05-29 MED ORDER — METOPROLOL TARTRATE 25 MG PO TABS
25.0000 mg | ORAL_TABLET | Freq: Two times a day (BID) | ORAL | Status: DC
  Administered 2017-05-29 – 2017-05-30 (×4): 25 mg via ORAL
  Filled 2017-05-29 (×4): qty 1

## 2017-05-29 MED ORDER — DILTIAZEM HCL 25 MG/5ML IV SOLN
10.0000 mg | Freq: Once | INTRAVENOUS | Status: AC
  Administered 2017-05-29: 10 mg via INTRAVENOUS
  Filled 2017-05-29: qty 5

## 2017-05-29 MED ORDER — SIMETHICONE 80 MG PO CHEW
160.0000 mg | CHEWABLE_TABLET | Freq: Four times a day (QID) | ORAL | Status: DC
  Administered 2017-05-29: 160 mg via ORAL
  Filled 2017-05-29: qty 2

## 2017-05-29 NOTE — Progress Notes (Signed)
Modified Barium Swallow Progress Note  Patient Details  Name: [name] MRN: [id number] Date of Birth: [birth year]  Today's Date: 05/29/2017  Modified Barium Swallow completed.  Full report located under Chart Review in the Imaging Section.  Brief recommendations include the following:  Clinical Impression  Pt presents with mild/mod oral phase dysphagia characterized by impaired mastication due to edentulous status and weak lingual manipulation resulting in prolonged oral phase and piecemeal deglutition. Pharyngeal phase is essentially WNL with flash penetration of thins during sequential cup sips with immediate removal and no aspiration observed. Suspect primary esophageal phase dysphagia as Pt noted to have distended, air-filled esophagus. Pt immediately vomited following sips of thin barium (started as belching and then vomited in four heaves- appeared to be breakfast and medication). MBSS was somewhat limited in intake due to Pt belching, regurgitation, and vomiting. Esophageal sweep completed several times throughout the study revealed esophageal distention and bird-beaking (this occurred after liquids appeared to backflow from stomach into esophagus). Would like radiologist to review imaging (Series #11, 12, and 13 labeled as cup thin sweep). Recommend D3/mech soft and thin liquids. Pt already consumes frequent, small meals due to previous gastrectomy. Pt appears at highest risk for post prandial aspiration at this time given regurgitation and suspected primary esophageal dysphagia. Reflux precautions should be adhered to.   Swallow Evaluation Recommendations   Recommended Consults: Consider GI evaluation;Consider esophageal assessment   SLP Diet Recommendations: Dysphagia 3 (Mech soft) solids;Thin liquid   Liquid Administration via: Cup   Medication Administration: Whole meds with puree   Supervision: Patient able to self feed;Intermittent supervision to cue for compensatory  strategies   Compensations: Slow rate   Postural Changes: Remain semi-upright after after feeds/meals (Comment);Seated upright at 90 degrees   Oral Care Recommendations: Oral care BID   Other Recommendations: Clarify dietary restrictions   Thank you,  Genene Churn, [place]  Macgregor Aeschliman 05/29/2017,6:28 PM

## 2017-05-29 NOTE — Progress Notes (Signed)
Progress Note  Patient Name: [name] Date of Encounter: 05/29/2017  Primary Cardiologist: Dr. Bronson Ing (new)   Subjective   She is doing well this morning and denies palpitations and chest pain.  Inpatient Medications    Scheduled Meds: . citalopram  10 mg Oral Daily  . diltiazem  360 mg Oral Daily  . ipratropium  2 spray Each Nare Q12H  . [START ON 05/30/2017] levothyroxine  50 mcg Oral Once per day on Mon Fri   And  . levothyroxine  25 mcg Oral Once per day on Sun Tue Wed Thu Sat  . loperamide  2 mg Oral TID  . memantine  10 mg Oral BID  . metoprolol tartrate  25 mg Oral BID  . potassium chloride  10 mEq Oral Daily  . rivaroxaban  15 mg Oral Q supper  . simethicone  160 mg Oral QID  . venlafaxine XR  37.5 mg Oral Q breakfast   Continuous Infusions:  PRN Meds: acetaminophen, diazepam, ipratropium, ondansetron (ZOFRAN) IV   Vital Signs    Vitals:   05/29/17 0700 05/29/17 0730 05/29/17 0753 05/29/17 0800  BP: 118/63 124/64  130/84  Pulse: 72 69 63 (!) 45  Resp: 18 13 16  (!) 21  Temp:   99 F (37.2 C)   TempSrc:   Oral   SpO2: 93% 97% 99% 96%  Weight:      Height:        Intake/Output Summary (Last 24 hours) at 05/29/2017 1001 Last data filed at 05/29/2017 0836 Gross per 24 hour  Intake 840 ml  Output 700 ml  Net 140 ml   Filed Weights   05/27/17 0500 05/28/17 0500 05/29/17 0500  Weight: 78 lb 0.7 oz (35.4 kg) 79 lb 9.4 oz (36.1 kg) 78 lb 11.3 oz (35.7 kg)    Telemetry    Rate controlled atrial fibrillation with heart rate in 80-90 bpm range.- Personally Reviewed   Physical Exam   GEN: Elderly chronically ill appearing womanin no acute distress   Neck: No JVD Cardiac:  Irregular rhythm, normal rate, no murmurs, rubs, or gallops.  Respiratory: Poor air movement with crackles in the left upper and middle lung fields and faint end expiratory wheezes on the right (unchanged) GI: Soft, nontender, non-distended  MS: No edema; No  deformity. Neuro:  Nonfocal  Psych: Normal affect   Labs    Chemistry Recent Labs  Lab 05/26/17 1317 05/27/17 0419  NA 141 138  K 3.9 3.6  CL 102 102  CO2 30 30  GLUCOSE 115* 110*  BUN 11 10  CREATININE 0.99 0.88  CALCIUM 9.4 8.5*  PROT 8.5*  --   ALBUMIN 4.0  --   AST 26  --   ALT 12*  --   ALKPHOS 103  --   BILITOT 0.6  --   GFRNONAA 50* 58*  GFRAA 58* >60  ANIONGAP 9 6     Hematology Recent Labs  Lab 05/26/17 1315 05/27/17 0419 05/28/17 0408  WBC 13.4* 10.4 11.0*  RBC 4.28 3.76* 3.67*  HGB 13.9 12.0 11.8*  HCT 43.7 37.7 36.7  MCV 102.1* 100.3* 100.0  MCH 32.5 31.9 32.2  MCHC 31.8 31.8 32.2  RDW 14.7 14.5 14.3  PLT 355 325 320    Cardiac Enzymes Recent Labs  Lab 05/26/17 1315 05/26/17 1703 05/26/17 2311 05/27/17 0419  TROPONINI 0.04* 0.04* 0.04* 0.04*   No results for input(s): TROPIPOC in the last 168 hours.   BNP Recent  Labs  Lab 05/26/17 1703  BNP 1,803.0*     DDimer No results for input(s): DDIMER in the last 168 hours.   Radiology    No results found.  Cardiac Studies   Echocardiogram (05/27/17):  - Left ventricle: The cavity size was normal. Wall thickness was increased in a pattern of mild LVH. Systolic function was normal. The estimated ejection fraction was in the range of 55% to 60%. Wall motion was normal; there were no regional wall motion abnormalities. The study was not technically sufficient to allow evaluation of LV diastolic dysfunction due to atrial fibrillation. Doppler parameters are consistent with high ventricular filling pressure. - Aortic valve: Trileaflet; mildly thickened, mildly calcified leaflets. There was mild stenosis. There was mild regurgitation. - Mitral valve: Moderately calcified annulus. Mildly thickened leaflets . There was mild regurgitation. - Left atrium: The atrium was mildly to moderately dilated. - Atrial septum: No defect or patent foramen ovale was identified. -  Tricuspid valve: Mildly thickened leaflets. There was moderate regurgitation. - Pulmonary arteries: PA peak pressure: 49 mm Hg (S).  Patient Profile      83 y.o.femalewith chronic interstitial lung disease admitted with rapid atrial flutter, abnormal ECG, and troponin elevation.   Assessment & Plan    1. Rapid atrial flutter and fibrillation:  Heart rate much better controlled after I increased diltiazem to 120 mg three times daily yesterday.  For feasibility sake, this will be switched to long-acting diltiazem 360 mg daily.  Metoprolol tartrate will also be increased to 25 mg twice daily.  She can be transferred to the floor.  She should be ambulated tomorrow.  If she remains stable, she would likely be able to be discharged tomorrow. Continue low-dose Xarelto 15 mg daily for thromboembolic risk reduction. Left atrium is mild to moderately dilated, while LV systolic function is normal.  2. Troponin elevation: This represents a nonspecific elevation. She denies chest pain. She has ruled out for an acute coronary syndrome.  3. Chronic interstitial lung disease: This appears to be stable and is followed by pulmonary.  4. Dysphagia: Her aide requested that I pursue a speech pathology evaluation today, as she was previously scheduled for this.  I spoke with Genene Churn in speech pathology and she will evaluate the patient today.  Disposition: She can be transferred upstairs to a general medical floor today.  If she remains stable tomorrow she can likely be discharged.  I have already arranged outpatient follow-up with me.   For questions or updates, please contact [facility] Please consult [email or web address] for contact info under Cardiology/STEMI.      Signed, Kate Sable, MD  05/29/2017, 10:01 AM

## 2017-05-29 NOTE — Plan of Care (Signed)
Nutrition Education Note  Consulted for diet education regarding weight gain in setting of cardiovascular risk factors and altered GI function.   Patient apparently has a history of gastrectomy ~20 years ago related to ulcers (billroth?) and claims to have had 75% of her stomach removed. She says she has had to follow a fairly bland diet since then.   Patient is already fairly knowledgeable in ideal eating habits. Because she can only tolerate small amounts at a time, she has tried to adapt her eating pattern to 3 small meals and 3 snacks/day. She has an aid at home who helps prepare meals.   Diet recall:  Breakfast: Egg, cheese, biscuit (homemade, no gravy)+Coffee Lunch: Sandwich or PBJ biscuit Dinner: Highly variable.  3 snacks usually consist of popcorn or PB crackers Bevs: Water, coffee, ~1 soda  She says she cannot tolerate dairy (other than cheese). She has tried Ensure/Boost in past, but says she cannot tolerate these.   She already tries to eat high fat foods as she has been told to do this by many of her doctors.   From our conversation, it sounds that her biggest barrier to weight gain right now is her gas/indigestion. She says it has been severe x2 weeks. She believes that water intake causes her to have very severe gas. She says this gas has caused her to "spit up" and severe gastric pains. Reviewed her diet. She does not sound to be eating many very high foods. The popcorn maybe higher in fiber, but she has eaten this since she was 83 years old. Noted swallow eval results are pending at this time.   Ultimately, her being able to gain weight will rely on resolution of her indigestion/gas.  RD briefly reviewed ideal diet for weight gain and stressed avoidance of high sodium items. Her diet doesn't really sound to be high in sodium.   Gave handouts titled "High Protein, High Calorie Nutrition Therapy" and "Low sodium Nutrition Therapy"  Burtis Junes RD, LDN, CNSC Clinical  Nutrition Pager: [phone] 05/29/2017 3:52 PM      Burtis Junes RD, LDN, CNSC Clinical Nutrition Pager: [phone] 05/29/2017 3:42 PM

## 2017-05-29 NOTE — Progress Notes (Signed)
PROGRESS NOTE    [name]  BHA:[id number] DOB: June 11, 1929 DOA: 05/26/2017 PCP: Kathyrn Drown, MD    Brief Narrative:  83 year old female with a history of cavitary lung disease, hypothyroidism, admitted to the hospital with rapid atrial fibrillation.  She had increasing palpitations and increased work of breathing for 4-5 days prior to admission.  She was admitted to the stepdown unit, started on a Cardizem infusion.  Cardiology following.   Assessment & Plan:   Active Problems:   Hypothyroidism   Depression with anxiety   Atrial fibrillation (HCC)   Chronic respiratory failure with hypoxia (HCC)   Interstitial lung disease ([place])   1. Atrial fibrillation rapid ventricular response.  Patient was initially started on intravenous Cardizem and heart rate has since improved.  Currently on oral metoprolol and oral diltiazem.  Plan was to transition to long-acting diltiazem today, but she developed recurrent tachycardia.  Will receive 1 dose of intravenous Cardizem.  If heart rate does not improve, may need to go back on infusion..  She is anticoagulated with Xarelto.  Echocardiogram done with results as below. 2. Elevated troponin.  Appears to be nonspecific.  No complaints of chest pain.  She is ruled out for ACS. 3. Interstitial lung disease/chronic respiratory failure.  Noted to have fibrotic changes/cavitary lesions on imaging for over a year now.  Chronically wears 2 L of oxygen.  Respiratory status appears to be at baseline.  Will refer her to pulmonology to be followed as an outpatient. 4. Hypothyroidism.  Continue on home dose of Synthroid   DVT prophylaxis: Xarelto Code Status: Full code Family Communication: Discussed with patient and family at the bedside Disposition Plan: Discharge home once improved   Consultants:   Cardiology  Procedures:  Echo:- Left ventricle: The cavity size was normal. Wall thickness was   increased in a pattern of mild LVH. Systolic  function was normal.   The estimated ejection fraction was in the range of 55% to 60%.   Wall motion was normal; there were no regional wall motion   abnormalities. The study was not technically sufficient to allow   evaluation of LV diastolic dysfunction due to atrial   fibrillation. Doppler parameters are consistent with high   ventricular filling pressure. - Aortic valve: Trileaflet; mildly thickened, mildly calcified   leaflets. There was mild stenosis. There was mild regurgitation. - Mitral valve: Moderately calcified annulus. Mildly thickened   leaflets . There was mild regurgitation. - Left atrium: The atrium was mildly to moderately dilated. - Atrial septum: No defect or patent foramen ovale was identified. - Tricuspid valve: Mildly thickened leaflets. There was moderate   regurgitation.  - Pulmonary arteries: PA peak pressure: 49 mm Hg (S).  Antimicrobials:       Subjective: Having some indigestion with frequent belching.  No chest pain or shortness of breath  Objective: Vitals:   05/29/17 1300 05/29/17 1400 05/29/17 1500 05/29/17 1600  BP: 127/86 (!) 122/105 121/77 111/69  Pulse: (!) 129 (!) 118 (!) 136 (!) 110  Resp: 18 (!) 23 17 (!) 23  Temp:      TempSrc:      SpO2: 97% 98% 96% 94%  Weight:      Height:        Intake/Output Summary (Last 24 hours) at 05/29/2017 1714 Last data filed at 05/29/2017 0836 Gross per 24 hour  Intake 480 ml  Output 700 ml  Net -220 ml   Filed Weights   05/27/17 0500 05/28/17 0500  05/29/17 0500  Weight: 35.4 kg (78 lb 0.7 oz) 36.1 kg (79 lb 9.4 oz) 35.7 kg (78 lb 11.3 oz)    Examination:  General exam: Appears calm and comfortable  Respiratory system: Clear to auscultation. Respiratory effort normal. Cardiovascular system: S1 & S2 heard, irregular. No JVD, murmurs, rubs, gallops or clicks. No pedal edema. Gastrointestinal system: Abdomen is nondistended, soft and nontender. No organomegaly or masses felt. Normal bowel  sounds heard.  Colostomy right lower quadrant Central nervous system: Alert and oriented. No focal neurological deficits. Extremities: Symmetric 5 x 5 power. Skin: No rashes, lesions or ulcers Psychiatry: Judgement and insight appear normal. Mood & affect appropriate.     Data Reviewed: I have personally reviewed following labs and imaging studies  CBC: Recent Labs  Lab 05/26/17 1126 05/26/17 1315 05/27/17 0419 05/28/17 0408  WBC  --  13.4* 10.4 11.0*  HGB 13.3 13.9 12.0 11.8*  HCT  --  43.7 37.7 36.7  MCV  --  102.1* 100.3* 100.0  PLT  --  355 325 132   Basic Metabolic Panel: Recent Labs  Lab 05/26/17 1317 05/27/17 0419  NA 141 138  K 3.9 3.6  CL 102 102  CO2 30 30  GLUCOSE 115* 110*  BUN 11 10  CREATININE 0.99 0.88  CALCIUM 9.4 8.5*   GFR: Estimated Creatinine Clearance: 25.9 mL/min (by C-G formula based on SCr of 0.88 mg/dL). Liver Function Tests: Recent Labs  Lab 05/26/17 1317  AST 26  ALT 12*  ALKPHOS 103  BILITOT 0.6  PROT 8.5*  ALBUMIN 4.0   No results for input(s): LIPASE, AMYLASE in the last 168 hours. No results for input(s): AMMONIA in the last 168 hours. Coagulation Profile: No results for input(s): INR, PROTIME in the last 168 hours. Cardiac Enzymes: Recent Labs  Lab 05/26/17 1315 05/26/17 1703 05/26/17 2311 05/27/17 0419  TROPONINI 0.04* 0.04* 0.04* 0.04*   BNP (last 3 results) No results for input(s): PROBNP in the last 8760 hours. HbA1C: No results for input(s): HGBA1C in the last 72 hours. CBG: No results for input(s): GLUCAP in the last 168 hours. Lipid Profile: No results for input(s): CHOL, HDL, LDLCALC, TRIG, CHOLHDL, LDLDIRECT in the last 72 hours. Thyroid Function Tests: No results for input(s): TSH, T4TOTAL, FREET4, T3FREE, THYROIDAB in the last 72 hours. Anemia Panel: No results for input(s): VITAMINB12, FOLATE, FERRITIN, TIBC, IRON, RETICCTPCT in the last 72 hours. Sepsis Labs: No results for input(s): PROCALCITON,  LATICACIDVEN in the last 168 hours.  Recent Results (from the past 240 hour(s))  MRSA PCR Screening     Status: None   Collection Time: 05/26/17  6:39 PM  Result Value Ref Range Status   MRSA by PCR NEGATIVE NEGATIVE Final    Comment:        The GeneXpert MRSA Assay (FDA approved for NASAL specimens only), is one component of a comprehensive MRSA colonization surveillance program. It is not intended to diagnose MRSA infection nor to guide or monitor treatment for MRSA infections.          Radiology Studies: No results found.      Scheduled Meds: . citalopram  10 mg Oral Daily  . diltiazem  360 mg Oral Daily  . diltiazem  10 mg Intravenous Once  . ipratropium  2 spray Each Nare Q12H  . [START ON 05/30/2017] levothyroxine  50 mcg Oral Once per day on Mon Fri   And  . levothyroxine  25 mcg Oral Once per day on  Nancy Fetter Tue Wed Thu Sat  . loperamide  2 mg Oral TID  . memantine  10 mg Oral BID  . metoprolol tartrate  25 mg Oral BID  . potassium chloride  10 mEq Oral Daily  . rivaroxaban  15 mg Oral Q supper  . venlafaxine XR  37.5 mg Oral Q breakfast   Continuous Infusions:   LOS: 3 days    Time spent: 72mins    Kathie Dike, MD [facility] Pager [phone]  If 7PM-7AM, please contact night-coverage [email or web address] Password [email or web address] 05/29/2017, 5:14 PM

## 2017-05-30 LAB — CBC
HCT: 36.9 % (ref 36.0–46.0)
HEMOGLOBIN: 11.9 g/dL — AB (ref 12.0–15.0)
MCH: 32.1 pg (ref 26.0–34.0)
MCHC: 32.2 g/dL (ref 30.0–36.0)
MCV: 99.5 fL (ref 78.0–100.0)
PLATELETS: 304 10*3/uL (ref 150–400)
RBC: 3.71 MIL/uL — AB (ref 3.87–5.11)
RDW: 14.4 % (ref 11.5–15.5)
WBC: 11.6 10*3/uL — AB (ref 4.0–10.5)

## 2017-05-30 MED ORDER — PANTOPRAZOLE SODIUM 40 MG PO TBEC
40.0000 mg | DELAYED_RELEASE_TABLET | Freq: Two times a day (BID) | ORAL | Status: DC
  Administered 2017-05-30 – 2017-06-04 (×9): 40 mg via ORAL
  Filled 2017-05-30 (×10): qty 1

## 2017-05-30 NOTE — Progress Notes (Signed)
Progress Note  Patient Name: [name] Date of Encounter: 05/30/2017  Primary Cardiologist: Dr. Bronson Ing (new)   Subjective   No complaints. Denies palpitations and chest pain. Only gets short of breath when she is up moving around. Pleasant as always.  Inpatient Medications    Scheduled Meds: . citalopram  10 mg Oral Daily  . diltiazem  360 mg Oral Daily  . ipratropium  2 spray Each Nare Q12H  . levothyroxine  50 mcg Oral Once per day on Mon Fri   And  . levothyroxine  25 mcg Oral Once per day on Sun Tue Wed Thu Sat  . loperamide  2 mg Oral TID  . memantine  10 mg Oral BID  . metoprolol tartrate  25 mg Oral BID  . pantoprazole  40 mg Oral BID AC  . potassium chloride  10 mEq Oral Daily  . rivaroxaban  15 mg Oral Q supper  . venlafaxine XR  37.5 mg Oral Q breakfast   Continuous Infusions: . diltiazem (CARDIZEM) infusion 2.5 mg/hr (05/30/17 0800)   PRN Meds: acetaminophen, diazepam, ipratropium, ondansetron (ZOFRAN) IV, simethicone   Vital Signs    Vitals:   05/30/17 0900 05/30/17 0915 05/30/17 0930 05/30/17 0945  BP: (!) 141/105 (!) 112/101 (!) 131/106 121/89  Pulse:  (!) 137 (!) 134 (!) 116  Resp: 19 15 18 20   Temp:      TempSrc:      SpO2:  95% 98% 99%  Weight:      Height:        Intake/Output Summary (Last 24 hours) at 05/30/2017 1048 Last data filed at 05/30/2017 0816 Gross per 24 hour  Intake 655.54 ml  Output -  Net 655.54 ml   Filed Weights   05/28/17 0500 05/29/17 0500 05/30/17 0500  Weight: 79 lb 9.4 oz (36.1 kg) 78 lb 11.3 oz (35.7 kg) 79 lb 12.9 oz (36.2 kg)    Telemetry    Previously tachycardic, currently in rate-controlled a fib, HR 70-90 bpm - Personally Reviewed   Physical Exam   GEN: Elderly chronically ill appearing womanin no acute distress    Neck: No JVD Cardiac: Irregular rhythm, normal rate, no murmurs, rubs, or gallops.  Respiratory: Diminished throughout, no crackles. GI: Soft, nontender, non-distended    MS: No edema; No deformity. Neuro:  Nonfocal  Psych: Normal affect   Labs    Chemistry Recent Labs  Lab 05/26/17 1317 05/27/17 0419  NA 141 138  K 3.9 3.6  CL 102 102  CO2 30 30  GLUCOSE 115* 110*  BUN 11 10  CREATININE 0.99 0.88  CALCIUM 9.4 8.5*  PROT 8.5*  --   ALBUMIN 4.0  --   AST 26  --   ALT 12*  --   ALKPHOS 103  --   BILITOT 0.6  --   GFRNONAA 50* 58*  GFRAA 58* >60  ANIONGAP 9 6     Hematology Recent Labs  Lab 05/27/17 0419 05/28/17 0408 05/30/17 0545  WBC 10.4 11.0* 11.6*  RBC 3.76* 3.67* 3.71*  HGB 12.0 11.8* 11.9*  HCT 37.7 36.7 36.9  MCV 100.3* 100.0 99.5  MCH 31.9 32.2 32.1  MCHC 31.8 32.2 32.2  RDW 14.5 14.3 14.4  PLT 325 320 304    Cardiac Enzymes Recent Labs  Lab 05/26/17 1315 05/26/17 1703 05/26/17 2311 05/27/17 0419  TROPONINI 0.04* 0.04* 0.04* 0.04*   No results for input(s): TROPIPOC in the last 168 hours.   BNP Recent  Labs  Lab 05/26/17 1703  BNP 1,803.0*     DDimer No results for input(s): DDIMER in the last 168 hours.   Radiology    Dg Swallowing Func-speech Pathology  Result Date: 05/29/2017 Objective Swallowing Evaluation: Type of Study: MBS-Modified Barium Swallow Study  Patient Details Name: [name] MRN: [id number] Date of Birth: February 27, 1929 Today's Date: 05/29/2017 Time: SLP Start Time (ACUTE ONLY): 1100 -SLP Stop Time (ACUTE ONLY): 1130 SLP Time Calculation (min) (ACUTE ONLY): 30 min Past Medical History: Past Medical History: Diagnosis Date . Anemia  . Cavitary lung disease 03/15/2015 . Chronic abdominal pain  . Chronic neck and back pain  . COPD (chronic obstructive pulmonary disease) ([place])  . GAD (generalized anxiety disorder)  . Gastroesophageal reflux disease  . Gastroparesis  . IBS (irritable bowel syndrome)  . Lung mass  . Migraines  . Osteoporosis  . 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 Renal insufficiency  . S/P colostomy ([place])  . Tachycardia  Past Surgical History: Past Surgical History: Procedure Laterality Date  . ABDOMINAL HYSTERECTOMY   . APPENDECTOMY   . CHOLECYSTECTOMY   . COLOSTOMY   . ESOPHAGOGASTRODUODENOSCOPY   . ESOPHAGOGASTRODUODENOSCOPY N/A 08/25/2015  Procedure: ESOPHAGOGASTRODUODENOSCOPY (EGD);  Surgeon: Rogene Houston, MD;  Location: [facility];  Service: Endoscopy;  Laterality: N/A;  730 . ILEOSCOPY N/A 08/25/2015  Procedure: ILEOSCOPY THROUGH STOMA;  Surgeon: Rogene Houston, MD;  Location: [facility];  Service: Endoscopy;  Laterality: N/A; . PARTIAL GASTRECTOMY  1967  3/4 gastric resection for bleeding ulcers . TONSILLECTOMY   HPI: 83 year old female with a history of cavitary lung disease, hypothyroidism, admitted to the hospital with rapid atrial fibrillation. She had increasing palpitations and increased work of breathing for 4-5 days prior to admission. She was admitted to the stepdown unit, started on a Cardizem infusion. Cardiology following. Pt was schedule for outpatient MBSS on 05/26/17, but was admitted to hospital before it could be completed. MBSS ordered as inpatient by Dr. Bronson Ing.  Subjective: "I feel like I may throw up." She had ileostomy several years ago for colonic obstruction secondary to Alosetron and extensive adhesions. She has remote history of TB and cavitary lung disease UGI with small bowel 08/14/2015: Esophageal dysmotility compatible with age. Prior subtotal gastrectomy with small gastric pouch. Focal narrowing at the gastrojejunostomy anastomosis, long and tapered proximally with shouldering distally raising question ofmass/tumor ; endoscopic assessment recommended. She therefore underwent EGD and ileoscopy on 08/25/2015. EGD revealed small sliding hiatal hernia and mild changes of reflux esophagitis limited gastric junction. Small gastric pouch with patent gastro-jejunostomy. Nonspecific finding of jejunal mucosal edema but biopsy was unremarkable. Current chest x-ray: Advanced chronic lung disease with scarring and areas of cavitation. Assessment / Plan /  Recommendation CHL IP CLINICAL IMPRESSIONS 05/29/2017 Clinical Impression Pt presents with mild/mod oral phase dysphagia characterized by impaired mastication due to edentulous status and weak lingual manipulation resulting in prolonged oral phase and piecemeal deglutition. Pharyngeal phase is essentially WNL with flash penetration of thins during sequential cup sips with immediate removal and no aspiration observed. Suspect primary esophageal phase dysphagia as Pt noted to have distended, air-filled esophagus. Pt immediately vomited following sips of thin barium (started as belching and then vomited in four heaves- appeared to be breakfast and medication). MBSS was somewhat limited in intake due to Pt belching, regurgitation, and vomiting. Esophageal sweep completed several times throughout the study revealed esophageal distention and bird-beaking (this occurred after liquids appeared to backflow from stomach into esophagus). Would like radiologist to  review imaging (Series #11, 12, and 13 labeled as cup thin sweep). Recommend D3/mech soft and thin liquids. Pt already consumes frequent, small meals due to previous gastrectomy. Pt appears at highest risk for post prandial aspiration at this time given regurgitation and suspected primary esophageal dysphagia. Reflux precautions should be adhered to.  SLP Visit Diagnosis Dysphagia, oral phase (R13.11);Dysphagia, pharyngoesophageal phase (R13.14) Attention and concentration deficit following -- Frontal lobe and executive function deficit following -- Impact on safety and function Mild aspiration risk;Risk for inadequate nutrition/hydration   CHL IP TREATMENT RECOMMENDATION 05/29/2017 Treatment Recommendations No treatment recommended at this time   Prognosis 05/29/2017 Prognosis for Safe Diet Advancement Fair Barriers to Reach Goals (No Data) Barriers/Prognosis Comment -- CHL IP DIET RECOMMENDATION 05/29/2017 SLP Diet Recommendations Dysphagia 3 (Mech soft) solids;Thin  liquid Liquid Administration via Cup Medication Administration Whole meds with puree Compensations Slow rate Postural Changes Remain semi-upright after after feeds/meals (Comment);Seated upright at 90 degrees   CHL IP OTHER RECOMMENDATIONS 05/29/2017 Recommended Consults Consider GI evaluation;Consider esophageal assessment Oral Care Recommendations Oral care BID Other Recommendations Clarify dietary restrictions   CHL IP FOLLOW UP RECOMMENDATIONS 05/29/2017 Follow up Recommendations None   CHL IP FREQUENCY AND DURATION 12/21/2015 Speech Therapy Frequency (ACUTE ONLY) min 1 x/week Treatment Duration 1 week      CHL IP ORAL PHASE 05/29/2017 Oral Phase Impaired Oral - Pudding Teaspoon -- Oral - Pudding Cup -- Oral - Honey Teaspoon -- Oral - Honey Cup -- Oral - Nectar Teaspoon -- Oral - Nectar Cup -- Oral - Nectar Straw -- Oral - Thin Teaspoon -- Oral - Thin Cup -- Oral - Thin Straw -- Oral - Puree -- Oral - Mech Soft Impaired mastication;Weak lingual manipulation;Piecemeal swallowing;Delayed oral transit Oral - Regular -- Oral - Multi-Consistency -- Oral - Pill -- Oral Phase - Comment --  CHL IP PHARYNGEAL PHASE 05/29/2017 Pharyngeal Phase Impaired Pharyngeal- Pudding Teaspoon -- Pharyngeal -- Pharyngeal- Pudding Cup -- Pharyngeal -- Pharyngeal- Honey Teaspoon -- Pharyngeal -- Pharyngeal- Honey Cup -- Pharyngeal -- Pharyngeal- Nectar Teaspoon -- Pharyngeal -- Pharyngeal- Nectar Cup -- Pharyngeal -- Pharyngeal- Nectar Straw -- Pharyngeal -- Pharyngeal- Thin Teaspoon -- Pharyngeal -- Pharyngeal- Thin Cup Delayed swallow initiation-vallecula;Penetration/Aspiration during swallow Pharyngeal Material does not enter airway;Material enters airway, remains ABOVE vocal cords then ejected out Pharyngeal- Thin Straw -- Pharyngeal -- Pharyngeal- Puree WFL;Delayed swallow initiation-vallecula Pharyngeal -- Pharyngeal- Mechanical Soft Delayed swallow initiation-vallecula;WFL Pharyngeal -- Pharyngeal- Regular -- Pharyngeal --  Pharyngeal- Multi-consistency -- Pharyngeal -- Pharyngeal- Pill WFL Pharyngeal -- Pharyngeal Comment Essentially WNL for age  CHL IP CERVICAL ESOPHAGEAL PHASE 05/29/2017 Cervical Esophageal Phase Impaired Pudding Teaspoon -- Pudding Cup -- Honey Teaspoon -- Honey Cup -- Nectar Teaspoon -- Nectar Cup -- Nectar Straw -- Thin Teaspoon -- Thin Cup Other (Comment) Thin Straw -- Puree -- Mechanical Soft -- Regular -- Multi-consistency -- Pill -- Cervical Esophageal Comment -- No flowsheet data found. PORTER,DABNEY 05/29/2017, 6:38 PM               Cardiac Studies   Echocardiogram (05/27/17):  - Left ventricle: The cavity size was normal. Wall thickness was increased in a pattern of mild LVH. Systolic function was normal. The estimated ejection fraction was in the range of 55% to 60%. Wall motion was normal; there were no regional wall motion abnormalities. The study was not technically sufficient to allow evaluation of LV diastolic dysfunction due to atrial fibrillation. Doppler parameters are consistent with high ventricular filling pressure. - Aortic valve: Trileaflet; mildly  thickened, mildly calcified leaflets. There was mild stenosis. There was mild regurgitation. - Mitral valve: Moderately calcified annulus. Mildly thickened leaflets . There was mild regurgitation. - Left atrium: The atrium was mildly to moderately dilated. - Atrial septum: No defect or patent foramen ovale was identified. - Tricuspid valve: Mildly thickened leaflets. There was moderate regurgitation. - Pulmonary arteries: PA peak pressure: 49 mm Hg (S).    Patient Profile     83 y.o. female with chronic interstitial lung disease admitted with rapid atrial flutter, abnormal ECG, and troponin elevation.  Assessment & Plan    1. Rapid atrial flutter and fibrillation: Currently back on IV diltiazem 2.5 mg/hr. She regurgitated some pills yesterday evening and had become tachycardic prior to  that. Will try weaning drip and continuing diltiazem 360 mg daily and metoprolol 25 mg bid. Should HR increase, I have recommended increasing metoprolol to 37.5 mg bid as BP tolerates. Continuelow-dose Xarelto 15 mg daily for thromboembolic risk reduction. Left atrium is mild to moderately dilated, while LV systolic function is normal.  2. Troponin elevation: This represents a nonspecific elevation. She denies chest pain. She has ruled out for an acute coronary syndrome.  3. Chronic interstitial lung disease: This appears to be stable and is followed by pulmonary.  4. Dysphagia: Evaluated by Genene Churn (speech pathology) yesterday.  Disposition:  If she remains stable tomorrow she can likely be discharged.  I have already arranged outpatient follow-up with me.    For questions or updates, please contact [place] Please consult [email or web address] for contact info under Cardiology/STEMI.      Signed, Kate Sable, MD  05/30/2017, 10:48 AM

## 2017-05-30 NOTE — Evaluation (Signed)
Physical Therapy Evaluation Patient Details Name: [name] MRN: [id number] DOB: December 24, 1928 Today's Date: 05/30/2017   History of Present Illness  [name] is an 83yo white female who comes to [facility] on 11/12 p 4-5 days of increased DOE, palpitations, and weakness, found to be in AF c RVR. PMH: anemia, cavitary lung disease, Lung Mass, OP, hypoTSH, and s/p colostomy. At baseline, pt lives with husband at home, has 24/7 caregiver services assisting with ADL (modA), AMB household distances only c rollator at [facility]. No falls history, but caregiver reports significant changes in gait instability 4-6 weeks ago with associated loss of peripheral vision, which she reports has not been formally evaluated by PCP.   Clinical Impression  Pt admitted with above diagnosis. Pt currently with functional limitations due to the deficits listed below (see "PT Problem List"). Upon entry, the patient is received semirecumbent in bed, caregiver present. The pt is awake and agreeable to participate. No acute distress noted at this time. The pt is alert and oriented x3, pleasant, conversational, and following simple and multi-step commands consistently. Pt received on 2LPM [place] c SpO2 >93% throughout (baseline flow rate.) Functional mobility assessment demonstrates mild strength impairment in trunk and BLE however pt performs all functional mobility at supervision to CGA: she reports feelign at least 50% weaker than her baseline. Empirically, the patient demonstrates increased risk of recurrent falls AEB gait speed <0.35m/s. Recent changes in gait instability identify additional risk for falls. Pt will benefit from skilled PT intervention to increase independence and safety with basic mobility in preparation for discharge to the venue listed below.       Follow Up Recommendations Home health PT;Supervision/Assistance - 24 hour;Supervision for mobility/OOB    Equipment Recommendations  None recommended by PT     Recommendations for Other Services       Precautions / Restrictions Precautions Precautions: Fall Restrictions Weight Bearing Restrictions: No      Mobility  Bed Mobility Overal bed mobility: Modified Independent                Transfers Overall transfer level: Modified independent Equipment used: Rolling walker (2 wheeled)             General transfer comment: Ped RW for stability only, no UE needed for rising  Ambulation/Gait Ambulation/Gait assistance: Min guard Ambulation Distance (Feet): 50 Feet Assistive device: 1 person hand held assist   Gait velocity: <0.50m/s  Gait velocity interpretation: <1.8 ft/sec, indicative of risk for recurrent falls General Gait Details: alternating AMB and retro AMB at bedside d/t lines/leads; no LOB but difficult managing RW in retro, hence deferred midway without issue.   Stairs            Wheelchair Mobility    Modified Rankin (Stroke Patients Only)       Balance Overall balance assessment: Needs assistance(caregiver reported gait instability x 4=-6 weeks)                                           Pertinent Vitals/Pain Pain Assessment: No/denies pain    Home Living Family/patient expects to be discharged to:: Private residence Living Arrangements: Spouse/significant other Available Help at Discharge: Family;Personal care attendant(24/7 caregiver assistance) Type of Home: House Home Access: Stairs to enter Entrance Stairs-Rails: Chemical engineer of Steps: 5 Home Layout: One level;Laundry or work area in basement(does not use basement ) Home  Equipment: Gilford Rile - 4 wheels;Wheelchair - manual;Grab bars - tub/shower;Grab bars - toilet      Prior Function Level of Independence: Needs assistance   Gait / Transfers Assistance Needed: CAregivers provide modA for dressing and bathing, minA for toiletting and colostomy management; MAx-total assist with meals and housework. Pt  feeds self independently           Hand Dominance   Dominant Hand: Right    Extremity/Trunk Assessment             Cervical / Trunk Assessment Cervical / Trunk Assessment: Normal  Communication   Communication: No difficulties  Cognition Arousal/Alertness: Awake/alert Behavior During Therapy: WFL for tasks assessed/performed Overall Cognitive Status: Within Functional Limits for tasks assessed                                        General Comments      Exercises     Assessment/Plan    PT Assessment Patient needs continued PT services  PT Problem List Decreased strength;Cardiopulmonary status limiting activity;Decreased balance;Decreased activity tolerance       PT Treatment Interventions Balance training;Gait training;Stair training;Functional mobility training;Therapeutic activities;Therapeutic exercise;Patient/family education    PT Goals (Current goals can be found in the Care Plan section)  Acute Rehab PT Goals Patient Stated Goal: regain strength and reduce instability in gait  PT Goal Formulation: With patient Time For Goal Achievement: 06/13/17 Potential to Achieve Goals: Good    Frequency Min 3X/week   Barriers to discharge        Co-evaluation               AM-PAC PT "6 Clicks" Daily Activity  Outcome Measure Difficulty turning over in bed (including adjusting bedclothes, sheets and blankets)?: None Difficulty moving from lying on back to sitting on the side of the bed? : None Difficulty sitting down on and standing up from a chair with arms (e.g., wheelchair, bedside commode, etc,.)?: A Little Help needed moving to and from a bed to chair (including a wheelchair)?: A Little Help needed walking in hospital room?: A Little Help needed climbing 3-5 steps with a railing? : A Lot 6 Click Score: 19    End of Session Equipment Utilized During Treatment: Gait belt;Oxygen Activity Tolerance: Patient tolerated treatment  well;Patient limited by fatigue Patient left: in chair;with call bell/phone within reach;with family/visitor present;with nursing/sitter in room Nurse Communication: Mobility status PT Visit Diagnosis: Unsteadiness on feet (R26.81);Difficulty in walking, not elsewhere classified (R26.2);Muscle weakness (generalized) (M62.81)    Time: 3817-7116 PT Time Calculation (min) (ACUTE ONLY): 24 min   Charges:   PT Evaluation $PT Eval Low Complexity: 1 Low PT Treatments $Therapeutic Activity: 8-22 mins   PT G Codes:   PT G-Codes **NOT FOR INPATIENT CLASS** Functional Assessment Tool Used: AM-PAC 6 Clicks Basic Mobility;Clinical judgement Functional Limitation: Mobility: Walking and moving around Mobility: Walking and Moving Around Current Status (F7903): At least 20 percent but less than 40 percent impaired, limited or restricted Mobility: Walking and Moving Around Goal Status [phone]): At least 1 percent but less than 20 percent impaired, limited or restricted    11:58 AM, 05/30/17 Etta Grandchild, PT, DPT Physical Therapist - [place] [phone] (Office)   Hiren Peplinski C 05/30/2017, 11:55 AM

## 2017-05-30 NOTE — Care Management Note (Signed)
Case Management Note  Patient Details  Name: [name] MRN: [id number] Date of Birth: 05-Jan-1929  Subjective/Objective:  Adm with atrial fibrillation.  From home with 24 hour caregivers. Has cane and RW pta. Has continuous oxygen pta with [facility]. Recommended for [facility] PT, she would like [facility].                 Action/Plan:LInda [place] of [facility] notified and will obtain orders from chart when available.    Expected Discharge Date:   05/31/2017               Expected Discharge Plan:  [place]  In-House Referral:     Discharge planning Services  CM Consult  Post Acute Care Choice:  Home Health Choice offered to:  Patient, Adult Children  DME Arranged:    DME Agency:     HH Arranged:  PT Mart:  [place]  Status of Service:  Completed, signed off  If discussed at [place] of Stay Meetings, dates discussed:    Additional Comments:  Ronnell Clinger, Chauncey [place], RN 05/30/2017, 1:40 PM

## 2017-05-30 NOTE — Plan of Care (Signed)
Pt had excellent night. No pain or nausea. HR maintained 70's-90's. Cardizem gtt able to be titrated down. Caregiver managed colostomy.

## 2017-05-30 NOTE — Progress Notes (Signed)
PROGRESS NOTE    [name]  GXQ:[id number] DOB: [birth year] DOA: 05/26/2017 PCP: Kathyrn Drown, MD    Brief Narrative:  83 year old female with a history of cavitary lung disease, hypothyroidism, admitted to the hospital with rapid atrial fibrillation.  She had increasing palpitations and increased work of breathing for 4-5 days prior to admission.  She was admitted to the stepdown unit, started on a Cardizem infusion.  Cardiology following.   Assessment & Plan:   Active Problems:   Hypothyroidism   Depression with anxiety   Atrial fibrillation (HCC)   Chronic respiratory failure with hypoxia (HCC)   Interstitial lung disease ([place])   1. Atrial fibrillation rapid ventricular response.  Patient was initially started on intravenous Cardizem and heart rate began to improve.  She was transitioned to oral metoprolol and oral diltiazem and maintained stable heart rates.  Unfortunately, she reverted into tachycardia and was started back on cardizem infusion. This is being weaned off again as tolerated. She is anticoagulated with Xarelto.  Echocardiogram done with results as below. 2. Elevated troponin.  Appears to be nonspecific.  No complaints of chest pain.  She is ruled out for ACS. 3. Interstitial lung disease/chronic respiratory failure.  Noted to have fibrotic changes/cavitary lesions on imaging for over a year now.  Chronically wears 2 L of oxygen.  Respiratory status appears to be at baseline.  Will refer her to pulmonology to be followed as an outpatient. 4. Hypothyroidism.  Continue on home dose of Synthroid   DVT prophylaxis: Xarelto Code Status: Full code Family Communication: Discussed with patient and family at the bedside Disposition Plan: Discharge home once improved   Consultants:   Cardiology  Procedures:  Echo:- Left ventricle: The cavity size was normal. Wall thickness was   increased in a pattern of mild LVH. Systolic function was normal.   The estimated  ejection fraction was in the range of 55% to 60%.   Wall motion was normal; there were no regional wall motion   abnormalities. The study was not technically sufficient to allow   evaluation of LV diastolic dysfunction due to atrial   fibrillation. Doppler parameters are consistent with high   ventricular filling pressure. - Aortic valve: Trileaflet; mildly thickened, mildly calcified   leaflets. There was mild stenosis. There was mild regurgitation. - Mitral valve: Moderately calcified annulus. Mildly thickened   leaflets . There was mild regurgitation. - Left atrium: The atrium was mildly to moderately dilated. - Atrial septum: No defect or patent foramen ovale was identified. - Tricuspid valve: Mildly thickened leaflets. There was moderate   regurgitation.  - Pulmonary arteries: PA peak pressure: 49 mm Hg (S).  Antimicrobials:       Subjective: Having some indigestion with frequent belching.  No chest pain or shortness of breath  Objective: Vitals:   05/30/17 1147 05/30/17 1208 05/30/17 1600 05/30/17 1700  BP:   122/75 123/63  Pulse: (!) 107  63 62  Resp:   17 17  Temp:  98.1 F (36.7 C)    TempSrc:  Oral    SpO2: 96%  95% 96%  Weight:      Height:        Intake/Output Summary (Last 24 hours) at 05/30/2017 1729 Last data filed at 05/30/2017 1600 Gross per 24 hour  Intake 908.92 ml  Output -  Net 908.92 ml   Filed Weights   05/28/17 0500 05/29/17 0500 05/30/17 0500  Weight: 36.1 kg (79 lb 9.4 oz) 35.7 kg (78  lb 11.3 oz) 36.2 kg (79 lb 12.9 oz)    Examination:  General exam: Appears calm and comfortable  Respiratory system: Clear to auscultation. Respiratory effort normal. Cardiovascular system: S1 & S2 heard, irregular. No JVD, murmurs, rubs, gallops or clicks. No pedal edema. Gastrointestinal system: Abdomen is nondistended, soft and nontender. No organomegaly or masses felt. Normal bowel sounds heard.  Colostomy right lower quadrant Central nervous system:  Alert and oriented. No focal neurological deficits. Extremities: Symmetric 5 x 5 power. Skin: No rashes, lesions or ulcers Psychiatry: Judgement and insight appear normal. Mood & affect appropriate.     Data Reviewed: I have personally reviewed following labs and imaging studies  CBC: Recent Labs  Lab 05/26/17 1126 05/26/17 1315 05/27/17 0419 05/28/17 0408 05/30/17 0545  WBC  --  13.4* 10.4 11.0* 11.6*  HGB 13.3 13.9 12.0 11.8* 11.9*  HCT  --  43.7 37.7 36.7 36.9  MCV  --  102.1* 100.3* 100.0 99.5  PLT  --  355 325 320 400   Basic Metabolic Panel: Recent Labs  Lab 05/26/17 1317 05/27/17 0419  NA 141 138  K 3.9 3.6  CL 102 102  CO2 30 30  GLUCOSE 115* 110*  BUN 11 10  CREATININE 0.99 0.88  CALCIUM 9.4 8.5*   GFR: Estimated Creatinine Clearance: 26.2 mL/min (by C-G formula based on SCr of 0.88 mg/dL). Liver Function Tests: Recent Labs  Lab 05/26/17 1317  AST 26  ALT 12*  ALKPHOS 103  BILITOT 0.6  PROT 8.5*  ALBUMIN 4.0   No results for input(s): LIPASE, AMYLASE in the last 168 hours. No results for input(s): AMMONIA in the last 168 hours. Coagulation Profile: No results for input(s): INR, PROTIME in the last 168 hours. Cardiac Enzymes: Recent Labs  Lab 05/26/17 1315 05/26/17 1703 05/26/17 2311 05/27/17 0419  TROPONINI 0.04* 0.04* 0.04* 0.04*   BNP (last 3 results) No results for input(s): PROBNP in the last 8760 hours. HbA1C: No results for input(s): HGBA1C in the last 72 hours. CBG: No results for input(s): GLUCAP in the last 168 hours. Lipid Profile: No results for input(s): CHOL, HDL, LDLCALC, TRIG, CHOLHDL, LDLDIRECT in the last 72 hours. Thyroid Function Tests: No results for input(s): TSH, T4TOTAL, FREET4, T3FREE, THYROIDAB in the last 72 hours. Anemia Panel: No results for input(s): VITAMINB12, FOLATE, FERRITIN, TIBC, IRON, RETICCTPCT in the last 72 hours. Sepsis Labs: No results for input(s): PROCALCITON, LATICACIDVEN in the last 168  hours.  Recent Results (from the past 240 hour(s))  MRSA PCR Screening     Status: None   Collection Time: 05/26/17  6:39 PM  Result Value Ref Range Status   MRSA by PCR NEGATIVE NEGATIVE Final    Comment:        The GeneXpert MRSA Assay (FDA approved for NASAL specimens only), is one component of a comprehensive MRSA colonization surveillance program. It is not intended to diagnose MRSA infection nor to guide or monitor treatment for MRSA infections.          Radiology Studies: Dg Swallowing Func-speech Pathology  Result Date: 05/29/2017 Objective Swallowing Evaluation: Type of Study: MBS-Modified Barium Swallow Study  Patient Details Name: [name] MRN: [id number] Date of Birth: July 06, 1929 Today's Date: 05/29/2017 Time: SLP Start Time (ACUTE ONLY): 1100 -SLP Stop Time (ACUTE ONLY): 1130 SLP Time Calculation (min) (ACUTE ONLY): 30 min Past Medical History: Past Medical History: Diagnosis Date . Anemia  . Cavitary lung disease 03/15/2015 . Chronic abdominal pain  . Chronic  neck and back pain  . COPD (chronic obstructive pulmonary disease) ([place])  . GAD (generalized anxiety disorder)  . Gastroesophageal reflux disease  . Gastroparesis  . IBS (irritable bowel syndrome)  . Lung mass  . Migraines  . Osteoporosis  . 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 Renal insufficiency  . S/P colostomy ([place])  . Tachycardia  Past Surgical History: Past Surgical History: Procedure Laterality Date . ABDOMINAL HYSTERECTOMY   . APPENDECTOMY   . CHOLECYSTECTOMY   . COLOSTOMY   . ESOPHAGOGASTRODUODENOSCOPY   . ESOPHAGOGASTRODUODENOSCOPY N/A 08/25/2015  Procedure: ESOPHAGOGASTRODUODENOSCOPY (EGD);  Surgeon: Rogene Houston, MD;  Location: [facility];  Service: Endoscopy;  Laterality: N/A;  730 . ILEOSCOPY N/A 08/25/2015  Procedure: ILEOSCOPY THROUGH STOMA;  Surgeon: Rogene Houston, MD;  Location: [facility];  Service: Endoscopy;  Laterality: N/A; . PARTIAL GASTRECTOMY  1967  3/4 gastric resection for bleeding ulcers .  TONSILLECTOMY   HPI: 83 year old female with a history of cavitary lung disease, hypothyroidism, admitted to the hospital with rapid atrial fibrillation. She had increasing palpitations and increased work of breathing for 4-5 days prior to admission. She was admitted to the stepdown unit, started on a Cardizem infusion. Cardiology following. Pt was schedule for outpatient MBSS on 05/26/17, but was admitted to hospital before it could be completed. MBSS ordered as inpatient by Dr. Bronson Ing.  Subjective: "I feel like I may throw up." She had ileostomy several years ago for colonic obstruction secondary to Alosetron and extensive adhesions. She has remote history of TB and cavitary lung disease UGI with small bowel 08/14/2015: Esophageal dysmotility compatible with age. Prior subtotal gastrectomy with small gastric pouch. Focal narrowing at the gastrojejunostomy anastomosis, long and tapered proximally with shouldering distally raising question ofmass/tumor ; endoscopic assessment recommended. She therefore underwent EGD and ileoscopy on 08/25/2015. EGD revealed small sliding hiatal hernia and mild changes of reflux esophagitis limited gastric junction. Small gastric pouch with patent gastro-jejunostomy. Nonspecific finding of jejunal mucosal edema but biopsy was unremarkable. Current chest x-ray: Advanced chronic lung disease with scarring and areas of cavitation. Assessment / Plan / Recommendation CHL IP CLINICAL IMPRESSIONS 05/29/2017 Clinical Impression Pt presents with mild/mod oral phase dysphagia characterized by impaired mastication due to edentulous status and weak lingual manipulation resulting in prolonged oral phase and piecemeal deglutition. Pharyngeal phase is essentially WNL with flash penetration of thins during sequential cup sips with immediate removal and no aspiration observed. Suspect primary esophageal phase dysphagia as Pt noted to have distended, air-filled esophagus. Pt immediately vomited  following sips of thin barium (started as belching and then vomited in four heaves- appeared to be breakfast and medication). MBSS was somewhat limited in intake due to Pt belching, regurgitation, and vomiting. Esophageal sweep completed several times throughout the study revealed esophageal distention and bird-beaking (this occurred after liquids appeared to backflow from stomach into esophagus). Would like radiologist to review imaging (Series #11, 12, and 13 labeled as cup thin sweep). Recommend D3/mech soft and thin liquids. Pt already consumes frequent, small meals due to previous gastrectomy. Pt appears at highest risk for post prandial aspiration at this time given regurgitation and suspected primary esophageal dysphagia. Reflux precautions should be adhered to.  SLP Visit Diagnosis Dysphagia, oral phase (R13.11);Dysphagia, pharyngoesophageal phase (R13.14) Attention and concentration deficit following -- Frontal lobe and executive function deficit following -- Impact on safety and function Mild aspiration risk;Risk for inadequate nutrition/hydration   CHL IP TREATMENT RECOMMENDATION 05/29/2017 Treatment Recommendations No treatment recommended at this time   Prognosis  05/29/2017 Prognosis for Safe Diet Advancement Fair Barriers to Reach Goals (No Data) Barriers/Prognosis Comment -- CHL IP DIET RECOMMENDATION 05/29/2017 SLP Diet Recommendations Dysphagia 3 (Mech soft) solids;Thin liquid Liquid Administration via Cup Medication Administration Whole meds with puree Compensations Slow rate Postural Changes Remain semi-upright after after feeds/meals (Comment);Seated upright at 90 degrees   CHL IP OTHER RECOMMENDATIONS 05/29/2017 Recommended Consults Consider GI evaluation;Consider esophageal assessment Oral Care Recommendations Oral care BID Other Recommendations Clarify dietary restrictions   CHL IP FOLLOW UP RECOMMENDATIONS 05/29/2017 Follow up Recommendations None   CHL IP FREQUENCY AND DURATION 12/21/2015  Speech Therapy Frequency (ACUTE ONLY) min 1 x/week Treatment Duration 1 week      CHL IP ORAL PHASE 05/29/2017 Oral Phase Impaired Oral - Pudding Teaspoon -- Oral - Pudding Cup -- Oral - Honey Teaspoon -- Oral - Honey Cup -- Oral - Nectar Teaspoon -- Oral - Nectar Cup -- Oral - Nectar Straw -- Oral - Thin Teaspoon -- Oral - Thin Cup -- Oral - Thin Straw -- Oral - Puree -- Oral - Mech Soft Impaired mastication;Weak lingual manipulation;Piecemeal swallowing;Delayed oral transit Oral - Regular -- Oral - Multi-Consistency -- Oral - Pill -- Oral Phase - Comment --  CHL IP PHARYNGEAL PHASE 05/29/2017 Pharyngeal Phase Impaired Pharyngeal- Pudding Teaspoon -- Pharyngeal -- Pharyngeal- Pudding Cup -- Pharyngeal -- Pharyngeal- Honey Teaspoon -- Pharyngeal -- Pharyngeal- Honey Cup -- Pharyngeal -- Pharyngeal- Nectar Teaspoon -- Pharyngeal -- Pharyngeal- Nectar Cup -- Pharyngeal -- Pharyngeal- Nectar Straw -- Pharyngeal -- Pharyngeal- Thin Teaspoon -- Pharyngeal -- Pharyngeal- Thin Cup Delayed swallow initiation-vallecula;Penetration/Aspiration during swallow Pharyngeal Material does not enter airway;Material enters airway, remains ABOVE vocal cords then ejected out Pharyngeal- Thin Straw -- Pharyngeal -- Pharyngeal- Puree WFL;Delayed swallow initiation-vallecula Pharyngeal -- Pharyngeal- Mechanical Soft Delayed swallow initiation-vallecula;WFL Pharyngeal -- Pharyngeal- Regular -- Pharyngeal -- Pharyngeal- Multi-consistency -- Pharyngeal -- Pharyngeal- Pill WFL Pharyngeal -- Pharyngeal Comment Essentially WNL for age  CHL IP CERVICAL ESOPHAGEAL PHASE 05/29/2017 Cervical Esophageal Phase Impaired Pudding Teaspoon -- Pudding Cup -- Honey Teaspoon -- Honey Cup -- Nectar Teaspoon -- Nectar Cup -- Nectar Straw -- Thin Teaspoon -- Thin Cup Other (Comment) Thin Straw -- Puree -- Mechanical Soft -- Regular -- Multi-consistency -- Pill -- Cervical Esophageal Comment -- No flowsheet data found. PORTER,DABNEY 05/29/2017, 6:38 PM                    Scheduled Meds: . citalopram  10 mg Oral Daily  . diltiazem  360 mg Oral Daily  . ipratropium  2 spray Each Nare Q12H  . levothyroxine  50 mcg Oral Once per day on Mon Fri   And  . levothyroxine  25 mcg Oral Once per day on Sun Tue Wed Thu Sat  . loperamide  2 mg Oral TID  . memantine  10 mg Oral BID  . metoprolol tartrate  25 mg Oral BID  . pantoprazole  40 mg Oral BID AC  . potassium chloride  10 mEq Oral Daily  . rivaroxaban  15 mg Oral Q supper  . venlafaxine XR  37.5 mg Oral Q breakfast   Continuous Infusions: . diltiazem (CARDIZEM) infusion Stopped (05/30/17 1321)     LOS: 4 days    Time spent: 32mins    Kathie Dike, MD [facility] Pager [phone]  If 7PM-7AM, please contact night-coverage [email or web address] Password [email or web address] 05/30/2017, 5:29 PM

## 2017-05-30 NOTE — Care Management Important Message (Signed)
Important Message  Patient Details  Name: [name] MRN: [id number] Date of Birth: September 24, 1928   Medicare Important Message Given:  Yes    Jayde Mcallister, Chauncey [place], RN 05/30/2017, 1:48 PM

## 2017-05-31 MED ORDER — METOPROLOL TARTRATE 50 MG PO TABS
50.0000 mg | ORAL_TABLET | Freq: Two times a day (BID) | ORAL | Status: DC
  Administered 2017-05-31: 50 mg via ORAL
  Filled 2017-05-31: qty 1

## 2017-05-31 MED ORDER — METOPROLOL TARTRATE 25 MG PO TABS
37.5000 mg | ORAL_TABLET | Freq: Two times a day (BID) | ORAL | Status: DC
  Administered 2017-06-01 (×2): 37.5 mg via ORAL
  Filled 2017-05-31 (×2): qty 2

## 2017-05-31 NOTE — Plan of Care (Signed)
All care plans are progressing

## 2017-05-31 NOTE — Progress Notes (Signed)
PROGRESS NOTE    [name]  IRJ:[id number] DOB: Sep 21, 1928 DOA: 05/26/2017 PCP: Kathyrn Drown, MD    Brief Narrative:  83 year old female with a history of cavitary lung disease, hypothyroidism, admitted to the hospital with rapid atrial fibrillation.  She had increasing palpitations and increased work of breathing for 4-5 days prior to admission.  She was admitted to the stepdown unit, started on a Cardizem infusion.  Cardiology following.   Assessment & Plan:   Active Problems:   Hypothyroidism   Depression with anxiety   Atrial fibrillation (HCC)   Chronic respiratory failure with hypoxia (HCC)   Interstitial lung disease ([place])   1. Atrial fibrillation rapid ventricular response.  Patient was initially started on intravenous Cardizem and heart rate began to improve.  She was transitioned to oral metoprolol and oral diltiazem and maintained stable heart rates.  Unfortunately, she reverted into tachycardia and was started back on cardizem infusion.  This has since been weaned off.  Overall heart rate is better, but she is still having episodes of tachycardia.  Will increase metoprolol to 37.5 mg twice daily. She is anticoagulated with Xarelto.  Echocardiogram done with results as below. 2. Elevated troponin.  Appears to be nonspecific.  No complaints of chest pain.  She is ruled out for ACS. 3. Interstitial lung disease/chronic respiratory failure.  Noted to have fibrotic changes/cavitary lesions on imaging for over a year now.  Chronically wears 2 L of oxygen.  Respiratory status appears to be at baseline.  Will refer her to pulmonology to be followed as an outpatient. 4. Hypothyroidism.  Continue on home dose of Synthroid   DVT prophylaxis: Xarelto Code Status: Full code Family Communication: Discussed with patient and family at the bedside Disposition Plan: Discharge home once improved   Consultants:   Cardiology  Procedures:  Echo:- Left ventricle: The cavity size  was normal. Wall thickness was   increased in a pattern of mild LVH. Systolic function was normal.   The estimated ejection fraction was in the range of 55% to 60%.   Wall motion was normal; there were no regional wall motion   abnormalities. The study was not technically sufficient to allow   evaluation of LV diastolic dysfunction due to atrial   fibrillation. Doppler parameters are consistent with high   ventricular filling pressure. - Aortic valve: Trileaflet; mildly thickened, mildly calcified   leaflets. There was mild stenosis. There was mild regurgitation. - Mitral valve: Moderately calcified annulus. Mildly thickened   leaflets . There was mild regurgitation. - Left atrium: The atrium was mildly to moderately dilated. - Atrial septum: No defect or patent foramen ovale was identified. - Tricuspid valve: Mildly thickened leaflets. There was moderate   regurgitation.  - Pulmonary arteries: PA peak pressure: 49 mm Hg (S).  Antimicrobials:       Subjective: Denies any chest pain or shortness of breath.  Objective: Vitals:   05/31/17 1200 05/31/17 1210 05/31/17 1300 05/31/17 1649  BP: 123/89  118/72   Pulse: (!) 144  (!) 117   Resp: (!) 24  (!) 26   Temp:  97.8 F (36.6 C)  97.8 F (36.6 C)  TempSrc:  Oral  Oral  SpO2: 92%  95%   Weight:      Height:       No intake or output data in the 24 hours ending 05/31/17 1755 Filed Weights   05/29/17 0500 05/30/17 0500 05/31/17 0400  Weight: 35.7 kg (78 lb 11.3 oz) 36.2  kg (79 lb 12.9 oz) 36.3 kg (80 lb 0.4 oz)    Examination:  General exam: Appears calm and comfortable  Respiratory system: Clear to auscultation. Respiratory effort normal. Cardiovascular system: S1 & S2 heard, irregular. No JVD, murmurs, rubs, gallops or clicks. No pedal edema. Gastrointestinal system: Abdomen is nondistended, soft and nontender. No organomegaly or masses felt. Normal bowel sounds heard.  Colostomy right lower quadrant Central nervous  system: Alert and oriented. No focal neurological deficits. Extremities: Symmetric 5 x 5 power. Skin: No rashes, lesions or ulcers Psychiatry: Judgement and insight appear normal. Mood & affect appropriate.     Data Reviewed: I have personally reviewed following labs and imaging studies  CBC: Recent Labs  Lab 05/26/17 1126 05/26/17 1315 05/27/17 0419 05/28/17 0408 05/30/17 0545  WBC  --  13.4* 10.4 11.0* 11.6*  HGB 13.3 13.9 12.0 11.8* 11.9*  HCT  --  43.7 37.7 36.7 36.9  MCV  --  102.1* 100.3* 100.0 99.5  PLT  --  355 325 320 858   Basic Metabolic Panel: Recent Labs  Lab 05/26/17 1317 05/27/17 0419  NA 141 138  K 3.9 3.6  CL 102 102  CO2 30 30  GLUCOSE 115* 110*  BUN 11 10  CREATININE 0.99 0.88  CALCIUM 9.4 8.5*   GFR: Estimated Creatinine Clearance: 26.3 mL/min (by C-G formula based on SCr of 0.88 mg/dL). Liver Function Tests: Recent Labs  Lab 05/26/17 1317  AST 26  ALT 12*  ALKPHOS 103  BILITOT 0.6  PROT 8.5*  ALBUMIN 4.0   No results for input(s): LIPASE, AMYLASE in the last 168 hours. No results for input(s): AMMONIA in the last 168 hours. Coagulation Profile: No results for input(s): INR, PROTIME in the last 168 hours. Cardiac Enzymes: Recent Labs  Lab 05/26/17 1315 05/26/17 1703 05/26/17 2311 05/27/17 0419  TROPONINI 0.04* 0.04* 0.04* 0.04*   BNP (last 3 results) No results for input(s): PROBNP in the last 8760 hours. HbA1C: No results for input(s): HGBA1C in the last 72 hours. CBG: No results for input(s): GLUCAP in the last 168 hours. Lipid Profile: No results for input(s): CHOL, HDL, LDLCALC, TRIG, CHOLHDL, LDLDIRECT in the last 72 hours. Thyroid Function Tests: No results for input(s): TSH, T4TOTAL, FREET4, T3FREE, THYROIDAB in the last 72 hours. Anemia Panel: No results for input(s): VITAMINB12, FOLATE, FERRITIN, TIBC, IRON, RETICCTPCT in the last 72 hours. Sepsis Labs: No results for input(s): PROCALCITON, LATICACIDVEN in the  last 168 hours.  Recent Results (from the past 240 hour(s))  MRSA PCR Screening     Status: None   Collection Time: 05/26/17  6:39 PM  Result Value Ref Range Status   MRSA by PCR NEGATIVE NEGATIVE Final    Comment:        The GeneXpert MRSA Assay (FDA approved for NASAL specimens only), is one component of a comprehensive MRSA colonization surveillance program. It is not intended to diagnose MRSA infection nor to guide or monitor treatment for MRSA infections.          Radiology Studies: No results found.      Scheduled Meds: . citalopram  10 mg Oral Daily  . diltiazem  360 mg Oral Daily  . ipratropium  2 spray Each Nare Q12H  . levothyroxine  50 mcg Oral Once per day on Mon Fri   And  . levothyroxine  25 mcg Oral Once per day on Sun Tue Wed Thu Sat  . loperamide  2 mg Oral TID  .  memantine  10 mg Oral BID  . metoprolol tartrate  37.5 mg Oral BID  . pantoprazole  40 mg Oral BID AC  . potassium chloride  10 mEq Oral Daily  . rivaroxaban  15 mg Oral Q supper  . venlafaxine XR  37.5 mg Oral Q breakfast   Continuous Infusions: . diltiazem (CARDIZEM) infusion Stopped (05/30/17 1321)     LOS: 5 days    Time spent: 30mins    Kathie Dike, MD [facility] Pager [phone]  If 7PM-7AM, please contact night-coverage [email or web address] Password [email or web address] 05/31/2017, 5:55 PM

## 2017-06-01 LAB — CBC
HEMATOCRIT: 36.1 % (ref 36.0–46.0)
HEMOGLOBIN: 11.4 g/dL — AB (ref 12.0–15.0)
MCH: 31.4 pg (ref 26.0–34.0)
MCHC: 31.6 g/dL (ref 30.0–36.0)
MCV: 99.4 fL (ref 78.0–100.0)
Platelets: 294 10*3/uL (ref 150–400)
RBC: 3.63 MIL/uL — AB (ref 3.87–5.11)
RDW: 14.7 % (ref 11.5–15.5)
WBC: 10.4 10*3/uL (ref 4.0–10.5)

## 2017-06-01 MED ORDER — PANCRELIPASE (LIP-PROT-AMYL) 12000-38000 UNITS PO CPEP: 12000.0000 [IU] | ORAL_CAPSULE | Freq: Two times a day (BID) | ORAL | Status: DC | PRN

## 2017-06-01 MED ORDER — PANCRELIPASE (LIP-PROT-AMYL) 12000-38000 UNITS PO CPEP
24000.0000 [IU] | ORAL_CAPSULE | Freq: Three times a day (TID) | ORAL | Status: DC
  Administered 2017-06-02 – 2017-06-04 (×6): 24000 [IU] via ORAL
  Filled 2017-06-01 (×6): qty 2

## 2017-06-01 NOTE — Progress Notes (Signed)
Called Dr. Gala Romney for consult.

## 2017-06-01 NOTE — Consult Note (Signed)
Referring Provider: No ref. provider found Primary Care Physician:  Kathyrn Drown, MD Primary Gastroenterologist:  Dr. Laural Golden  Reason for Consultation:  Dysphagia.  HPI:   Pleasant,  frail 84 year old lady with multiple comorbidities admitted to the hospital with new onset atrial fibrillation with rapid VR and possible non-STEMI MI.  Rate now better controlled. Anticoagulation started in the way of Xarelto.  Patient has had some vague symptoms of  difficulty initiating swallowing and esophageal dysphagia. Speech evaluation demonstrated some oropharyngeal a question of distal esophageal narrowing on limited barium study. Patient denies chronic reflux symptoms on Protonix.  She did have an upper GI small bowel follow-through back in 2017 which demonstrated only some nonspecific esophageal dysmotility.  History of possible small bowel bacterial overgrowth not responsive to antibiotics previously. More recently, she was found to have a markedly elevated stool fecal fat (51 g per 24 hours) . She was started on pancreatic enzymes.  It sounds like she had a difficult time with getting a prescription and obtaining samples. May have been on supplemental enzymes for at least a few weeks prior to her hospitalization.  I do note she weighs 83 pounds currently and weighed 72 pounds Dr. Olevia Perches office back in May of this year.  She denies any vomiting, bright red blood or melena through her ileostomy.  Extensive GI history including hemigastrectomy for peptic ulcer disease, bowel surgery for what sounds like small bowel adhesions and therapeutic misadventure with Lotronex.  She also has reported history of gastroparesis previously on domperidone.  Patient notes increased belching, gas and nausea this admission. Her pancreatic enzyme supplements were not continued during this hospitalization.  Past Medical History:  Diagnosis Date  . Anemia   . 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 Chronic  abdominal pain   . Chronic neck and back pain   . COPD (chronic obstructive pulmonary disease) ([place])   . GAD (generalized anxiety disorder)   . Gastroesophageal reflux disease   . Gastroparesis   . IBS (irritable bowel syndrome)   . Lung mass   . Migraines   . Osteoporosis   . 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 Renal insufficiency   . S/P colostomy ([place])   . Tachycardia     Past Surgical History:  Procedure Laterality Date  . ABDOMINAL HYSTERECTOMY    . APPENDECTOMY    . CHOLECYSTECTOMY    . COLOSTOMY    . ESOPHAGOGASTRODUODENOSCOPY    . ESOPHAGOGASTRODUODENOSCOPY (EGD) N/A 08/25/2015   Performed by Rogene Houston, MD at [facility]  . ILEOSCOPY THROUGH STOMA N/A 08/25/2015   Performed by Rogene Houston, MD at [place]  . PARTIAL GASTRECTOMY  1967   3/4 gastric resection for bleeding ulcers  . TONSILLECTOMY      Prior to Admission medications   Medication Sig Start Date End Date Taking? Authorizing Provider  acetaminophen (TYLENOL) 325 MG tablet Take 2 tablets (650 mg total) by mouth every 6 (six) hours as needed for mild pain or headache (or Fever >/= 101). 12/22/15  Yes Samuella Cota, MD  citalopram (CELEXA) 10 MG tablet TAKE (1) TABLET BY MOUTH ONCE DAILY. 01/20/17  Yes Luking, Scott A, MD  diazepam (VALIUM) 2 MG tablet Take 1 tablets twice a daily as needed. Take sparingly (Prescription must last 30 days) 03/06/17  Yes Luking, Elayne Snare, MD  Doxylamine-DM (ROBITUSSIN NIGHTTIME COUGH DM) 12.5-30 MG/10ML LIQD Take 5 mLs by mouth daily as needed (for cough). Patient taked 1 teaspoon as  needed.    Yes [provider]  ipratropium (ATROVENT HFA) 17 MCG/ACT inhaler 2 PUFFS FOUR TIMES DAILY. Patient taking differently: Inhale 2 puffs into the lungs 4 (four) times daily.  09/20/16  Yes Luking, Scott A, MD  ipratropium (ATROVENT) 0.03 % nasal spray Place 2 sprays into both nostrils every 12 (twelve) hours. 04/29/17  Yes Luking, Elayne Snare, MD  levothyroxine (SYNTHROID,  LEVOTHROID) 25 MCG tablet TAKE 2 TABLETS BY MOUTH ON MONDAY AND FRIDAY. & 1 TABLET ALL OTHER DAYS 05/08/17  Yes Luking, Scott A, MD  loperamide (IMODIUM) 2 MG capsule TAKE (1) CAPSULE THREE TIMES DAILY BEFORE MEALS. 02/07/17  Yes Rehman, Mechele Dawley, MD  Melatonin 5 MG CAPS Take 5 mg by mouth at bedtime.   Yes [provider]  memantine (NAMENDA) 10 MG tablet TAKE 1 TABLET BY MOUTH TWICE DAILY. 02/20/17  Yes Luking, Scott A, MD  metoprolol tartrate (LOPRESSOR) 25 MG tablet TAKE 1/2 TABLET TWICE DAILY FOR HIGH BLOOD PRESSURE. 01/20/17  Yes Kathyrn Drown, MD  mupirocin ointment (BACTROBAN) 2 % Apply to affected area 2 times daily 05/15/17 05/15/18 Yes Luking, Elayne Snare, MD  OXYGEN Inhale into the lungs. 2 liters -Nasal [place]   Yes [provider]  potassium chloride (K-DUR) 10 MEQ tablet TAKE (1) TABLET BY MOUTH ONCE DAILY. 05/08/17  Yes Luking, Elayne Snare, MD  Simethicone (GAS RELIEF PO) Take 1 tablet by mouth daily as needed (for gas relief).    Yes [provider]  venlafaxine XR (EFFEXOR-XR) 37.5 MG 24 hr capsule TAKE 1 CAPSULE BY MOUTH ONCE DAILY WITH BREAKFAST 05/08/17  Yes Luking, Elayne Snare, MD  chlorpheniramine-HYDROcodone (TUSSIONEX PENNKINETIC ER) 10-8 MG/5ML SUER Take 5 mLs by mouth every 12 (twelve) hours as needed for cough. Patient not taking: Reported on 05/26/2017 03/06/17   Kathyrn Drown, MD  lipase/protease/amylase (CREON) 12000 units CPEP capsule TAKE 2 CAPSULES BY MOUTH 3 TIMES DAILY WITH MEALS. TAKE 1 CAPSULE TWICE DAILY WITH SNACKS Patient not taking: Reported on 05/26/2017 04/23/17   Rogene Houston, MD  venlafaxine XR (EFFEXOR-XR) 75 MG 24 hr capsule TAKE 1 CAPSULE ONCE DAILY WITH BREAKFAST. 12/30/16   Kathyrn Drown, MD    Current Facility-Administered Medications  Medication Dose Route Frequency Provider Last Rate Last Dose  . acetaminophen (TYLENOL) tablet 650 mg  650 mg Oral Q4H PRN Deneise Lever, MD   650 mg at 05/31/17 1640  . citalopram (CELEXA)  tablet 10 mg  10 mg Oral Daily Deneise Lever, MD   10 mg at 06/01/17 0909  . diazepam (VALIUM) tablet 2 mg  2 mg Oral Q12H PRN Deneise Lever, MD   2 mg at 05/31/17 2121  . diltiazem (CARDIZEM CD) 24 hr capsule 360 mg  360 mg Oral Daily Kathie Dike, MD   360 mg at 06/01/17 0908  . diltiazem (CARDIZEM) 100 mg in dextrose 5% 150mL (1 mg/mL) infusion  5-15 mg/hr Intravenous Titrated Kathie Dike, MD   Stopped at 05/30/17 1321  . ipratropium (ATROVENT) 0.03 % nasal spray 2 spray  2 spray Each Nare Q12H Deneise Lever, MD   2 spray at 05/31/17 2121  . ipratropium (ATROVENT) nebulizer solution 0.5 mg  0.5 mg Nebulization Q4H PRN Kathie Dike, MD      . levothyroxine (SYNTHROID, LEVOTHROID) tablet 50 mcg  50 mcg Oral Once per day on Mon Fri Memon, Jehanzeb, MD   50 mcg at 05/30/17 0747   And  . levothyroxine (SYNTHROID, LEVOTHROID)  tablet 25 mcg  25 mcg Oral Once per day on Sun Tue Wed Thu Sat Kathie Dike, MD   25 mcg at 06/01/17 0818  . loperamide (IMODIUM) capsule 2 mg  2 mg Oral TID Kathie Dike, MD   2 mg at 06/01/17 0908  . memantine (NAMENDA) tablet 10 mg  10 mg Oral BID Deneise Lever, MD   10 mg at 06/01/17 0908  . metoprolol tartrate (LOPRESSOR) tablet 37.5 mg  37.5 mg Oral BID Kathie Dike, MD   37.5 mg at 06/01/17 0908  . ondansetron (ZOFRAN) injection 4 mg  4 mg Intravenous Q6H PRN Deneise Lever, MD   4 mg at 05/30/17 1241  . pantoprazole (PROTONIX) EC tablet 40 mg  40 mg Oral BID AC Kathie Dike, MD   40 mg at 06/01/17 0818  . potassium chloride (K-DUR,KLOR-CON) CR tablet 10 mEq  10 mEq Oral Daily Kathie Dike, MD   10 mEq at 06/01/17 0909  . Rivaroxaban (XARELTO) tablet 15 mg  15 mg Oral Q supper Kathie Dike, MD   15 mg at 05/31/17 1640  . simethicone (MYLICON) 40 MO/2.9UT suspension 40 mg  40 mg Oral QID PRN Kathie Dike, MD   40 mg at 05/31/17 1720  . venlafaxine XR (EFFEXOR-XR) 24 hr capsule 37.5 mg  37.5 mg Oral Q breakfast Deneise Lever, MD    37.5 mg at 06/01/17 6546    Allergies as of 05/26/2017 - Review Complete 05/26/2017  Allergen Reaction Noted  . Levaquin [levofloxacin] Other (See Comments) 08/01/2015  . Aspirin Nausea And Vomiting 01/07/2017  . Tetanus toxoid Hives and Swelling   . Zolpidem tartrate    . Fosamax [alendronate sodium] Other (See Comments) 09/13/2014    Family History  Problem Relation Age of Onset  . Stroke Mother   . Stroke Father   . Diabetes Sister   . Diabetes Brother   . Stroke Brother   . Cancer Sister        unknown type  . Diabetes Sister   . Emphysema Sister   . Diabetes Brother   . Stroke Brother   . Diabetes Son   . Irritable bowel syndrome Son   . Diabetes Son   . Hypertension Son     Social History   Socioeconomic History  . Marital status: Married    Spouse name: Not on file  . Number of children: Not on file  . Years of education: Not on file  . Highest education level: Not on file  Social Needs  . Financial resource strain: Not on file  . Food insecurity - worry: Not on file  . Food insecurity - inability: Not on file  . Transportation needs - medical: Not on file  . Transportation needs - non-medical: Not on file  Occupational History  . Not on file  Tobacco Use  . Smoking status: Never Smoker  . Smokeless tobacco: Never Used  Substance and Sexual Activity  . Alcohol use: No    Alcohol/week: 0.0 oz  . Drug use: No  . Sexual activity: Not on file  Other Topics Concern  . Not on file  Social History Narrative  . Not on file    Review of Systems: As in history of present illness   Physical Exam: Vital signs in last 24 hours: Temp:  [97.6 F (36.4 C)-98.4 F (36.9 C)] 97.6 F (36.4 C) (11/18 0800) Pulse Rate:  [57-117] 74 (11/18 0800) Resp:  [13-26] 14 (11/18 0800) BP: (85-137)/(52-89)  127/70 (11/18 0800) SpO2:  [88 %-100 %] 100 % (11/18 0800) Weight:  [81 lb 2.1 oz (36.8 kg)] 81 lb 2.1 oz (36.8 kg) (11/18 0500) Last BM Date:  05/31/17 General:   Frail, chronically ill-appearing, pleasant and cooperative in NAD (accompanied by 2 of her home caregivers, Opal Sidles and Earlie Server) Neck:  Supple; no masses or thyromegaly. Lungs:  Clear throughout to auscultation.   No wheezes, crackles, or rhonchi. No acute distress. Heart:  Regular rate and rhythm; no murmurs, clicks, rubs,  or gallops. Abdomen:  Nondistended. Positive bowel sounds. Ileostomy looks good right lower quadrant. Soft and nontender without obvious mass.  Intake/Output from previous day: 11/17 0701 - 11/18 0700 In: -  Out: 200 [Stool:200] Intake/Output this shift: No intake/output data recorded.  Lab Results: Recent Labs    05/30/17 0545 06/01/17 0500  WBC 11.6* 10.4  HGB 11.9* 11.4*  HCT 36.9 36.1  PLT 304 294    Impression:  Debilitated 83 year old lady with multiple comorbidities admitted to the hospital with new onset atrial fibrillation with rapid ventricular response and possible non-STEMI.  Now anticoagulated.  She is complaining of some vague symptoms of oral and esophageal dysphagia along with belching, gas and nausea.  Speech pathology evaluation concerning for some oral dysfunction but also esophageal dysphagia.  She has a complicated GI history including hemigastrectomy, gastroparesis and markedly elevated fecal fat implying pancreatic exocrine insufficiency (apparently, no pancreatic enzymes since admission).  History of GERD with ongoing PPI therapy.   Recommendations:  As discussed with Dr. Roderic Palau,  she should go back on her pancreatic enzyme supplement empirically along with a PPI.  Might actually consider upward titration of pancreatic enzyme supplementation depending on her response to therapy.  Will go ahead and order a barium pill esophagram tomorrow to evaluate her esophageal dysphagia symptoms.   Will hold off on any specific measures for gastroparesis at this time.  Further recommendations to  follow.          Notice:  This dictation was prepared with Dragon dictation along with smaller phrase technology. Any transcriptional errors that result from this process are unintentional and may not be corrected upon review.

## 2017-06-01 NOTE — Progress Notes (Signed)
PROGRESS NOTE    [name]  PNT:[id number] DOB: 03-29-1929 DOA: 05/26/2017 PCP: Kathyrn Drown, MD    Brief Narrative:  83 year old female with a history of cavitary/interstitial lung disease, hypothyroidism, admitted to the hospital with rapid atrial fibrillation.  She had increasing palpitations and increased work of breathing for 4-5 days prior to admission.  Heart rate has been managed with intravenous Cardizem, metoprolol.  She is since been transitioned to an oral regimen and started on anticoagulation with Xarelto.  Cardiology has been following.  She is also had significant nausea, vomiting with associated dyspepsia.  GI consulted and she will undergo barium swallow on 11/19.   Assessment & Plan:   Active Problems:   Hypothyroidism   Depression with anxiety   Atrial fibrillation (HCC)   Chronic respiratory failure with hypoxia (H[facility])   Interstitial lung disease ([place])   1. Atrial fibrillation rapid ventricular response.  Patient was initially started on intravenous Cardizem and heart rate began to improve.  She was transitioned to oral metoprolol and oral diltiazem and maintained stable heart rates.  Unfortunately, she reverted back into tachycardia and was started back on cardizem infusion.  This has since been weaned off again.  Overall heart rate is better.  Continue metoprolol at 37.5 mg twice daily as well as Cardizem 360 mg daily. She is anticoagulated with Xarelto.  Echocardiogram done with results as below. 2. Elevated troponin.  Appears to be nonspecific.  No complaints of chest pain.  She is ruled out for ACS. 3. Interstitial lung disease/chronic respiratory failure.  Noted to have fibrotic changes/cavitary lesions on imaging for over a year now.  Chronically wears 2 L of oxygen.  Respiratory status appears to be at baseline.  Case reviewed with Dr. Luan Pulling.  The patient will need to be referred to him in order to be followed up as an outpatient 4. Hypothyroidism.   Continue on home dose of Synthroid 5. Nausea, vomiting, dyspepsia.  Patient reports having frequent "burping" episodes associated with nausea and vomiting.  She will throw up her medications.  She was started on a PPI without significant benefit.  She is very concerned about going home with this condition.  She has had this for quite some time now.  Swallow study done had indicated some possible esophageal component.  She has had gastrectomy in the past.  GI consulted to see if endoscopy is necessary prior to discharge.   DVT prophylaxis: Xarelto Code Status: Full code Family Communication: Discussed with patient and family at the bedside Disposition Plan: Discharge home once improved   Consultants:   Cardiology  Gastroenterology  Procedures:  Echo:- Left ventricle: The cavity size was normal. Wall thickness was   increased in a pattern of mild LVH. Systolic function was normal.   The estimated ejection fraction was in the range of 55% to 60%.   Wall motion was normal; there were no regional wall motion   abnormalities. The study was not technically sufficient to allow   evaluation of LV diastolic dysfunction due to atrial   fibrillation. Doppler parameters are consistent with high   ventricular filling pressure. - Aortic valve: Trileaflet; mildly thickened, mildly calcified   leaflets. There was mild stenosis. There was mild regurgitation. - Mitral valve: Moderately calcified annulus. Mildly thickened   leaflets . There was mild regurgitation. - Left atrium: The atrium was mildly to moderately dilated. - Atrial septum: No defect or patent foramen ovale was identified. - Tricuspid valve: Mildly thickened leaflets. There was  moderate   regurgitation.  - Pulmonary arteries: PA peak pressure: 49 mm Hg (S).  Antimicrobials:       Subjective: Had some dyspepsia and nausea last night.  No chest pain shortness of breath.  Objective: Vitals:   06/01/17 0700 06/01/17 0800  06/01/17 1200 06/01/17 1704  BP: 125/60 127/70    Pulse: 75 74    Resp: 13 14    Temp:  97.6 F (36.4 C) 97.6 F (36.4 C) 97.8 F (36.6 C)  TempSrc:  Oral Oral Oral  SpO2: 99% 100%    Weight:      Height:        Intake/Output Summary (Last 24 hours) at 06/01/2017 1733 Last data filed at 06/01/2017 0500 Gross per 24 hour  Intake -  Output 200 ml  Net -200 ml   Filed Weights   05/30/17 0500 05/31/17 0400 06/01/17 0500  Weight: 36.2 kg (79 lb 12.9 oz) 36.3 kg (80 lb 0.4 oz) 36.8 kg (81 lb 2.1 oz)    Examination:  General exam: Appears calm and comfortable  Respiratory system: Clear to auscultation. Respiratory effort normal. Cardiovascular system: S1 & S2 heard, irregular. No JVD, murmurs, rubs, gallops or clicks. No pedal edema. Gastrointestinal system: Abdomen is nondistended, soft and nontender. No organomegaly or masses felt. Normal bowel sounds heard.  Colostomy right lower quadrant Central nervous system: Alert and oriented. No focal neurological deficits. Extremities: Symmetric 5 x 5 power. Skin: No rashes, lesions or ulcers Psychiatry: Judgement and insight appear normal. Mood & affect appropriate.     Data Reviewed: I have personally reviewed following labs and imaging studies  CBC: Recent Labs  Lab 05/26/17 1315 05/27/17 0419 05/28/17 0408 05/30/17 0545 06/01/17 0500  WBC 13.4* 10.4 11.0* 11.6* 10.4  HGB 13.9 12.0 11.8* 11.9* 11.4*  HCT 43.7 37.7 36.7 36.9 36.1  MCV 102.1* 100.3* 100.0 99.5 99.4  PLT 355 325 320 304 035   Basic Metabolic Panel: Recent Labs  Lab 05/26/17 1317 05/27/17 0419  NA 141 138  K 3.9 3.6  CL 102 102  CO2 30 30  GLUCOSE 115* 110*  BUN 11 10  CREATININE 0.99 0.88  CALCIUM 9.4 8.5*   GFR: Estimated Creatinine Clearance: 26.7 mL/min (by C-G formula based on SCr of 0.88 mg/dL). Liver Function Tests: Recent Labs  Lab 05/26/17 1317  AST 26  ALT 12*  ALKPHOS 103  BILITOT 0.6  PROT 8.5*  ALBUMIN 4.0   No results  for input(s): LIPASE, AMYLASE in the last 168 hours. No results for input(s): AMMONIA in the last 168 hours. Coagulation Profile: No results for input(s): INR, PROTIME in the last 168 hours. Cardiac Enzymes: Recent Labs  Lab 05/26/17 1315 05/26/17 1703 05/26/17 2311 05/27/17 0419  TROPONINI 0.04* 0.04* 0.04* 0.04*   BNP (last 3 results) No results for input(s): PROBNP in the last 8760 hours. HbA1C: No results for input(s): HGBA1C in the last 72 hours. CBG: No results for input(s): GLUCAP in the last 168 hours. Lipid Profile: No results for input(s): CHOL, HDL, LDLCALC, TRIG, CHOLHDL, LDLDIRECT in the last 72 hours. Thyroid Function Tests: No results for input(s): TSH, T4TOTAL, FREET4, T3FREE, THYROIDAB in the last 72 hours. Anemia Panel: No results for input(s): VITAMINB12, FOLATE, FERRITIN, TIBC, IRON, RETICCTPCT in the last 72 hours. Sepsis Labs: No results for input(s): PROCALCITON, LATICACIDVEN in the last 168 hours.  Recent Results (from the past 240 hour(s))  MRSA PCR Screening     Status: None   Collection Time:  05/26/17  6:39 PM  Result Value Ref Range Status   MRSA by PCR NEGATIVE NEGATIVE Final    Comment:        The GeneXpert MRSA Assay (FDA approved for NASAL specimens only), is one component of a comprehensive MRSA colonization surveillance program. It is not intended to diagnose MRSA infection nor to guide or monitor treatment for MRSA infections.          Radiology Studies: No results found.      Scheduled Meds: . citalopram  10 mg Oral Daily  . diltiazem  360 mg Oral Daily  . ipratropium  2 spray Each Nare Q12H  . levothyroxine  50 mcg Oral Once per day on Mon Fri   And  . levothyroxine  25 mcg Oral Once per day on Sun Tue Wed Thu Sat  . [START ON 06/02/2017] lipase/protease/amylase  24,000 Units Oral TID WC  . loperamide  2 mg Oral TID  . memantine  10 mg Oral BID  . metoprolol tartrate  37.5 mg Oral BID  . pantoprazole  40 mg Oral  BID AC  . potassium chloride  10 mEq Oral Daily  . rivaroxaban  15 mg Oral Q supper  . venlafaxine XR  37.5 mg Oral Q breakfast   Continuous Infusions: . diltiazem (CARDIZEM) infusion Stopped (05/30/17 1321)     LOS: 6 days    Time spent: 52mins    Kathie Dike, MD [facility] Pager [phone]  If 7PM-7AM, please contact night-coverage [email or web address] Password [facility] 06/01/2017, 5:33 PM

## 2017-06-01 NOTE — Progress Notes (Signed)
Walked patient about 75 feet. heartrate increased to 133 and varied between 133 and 120 during walk. Placed back in bed heart rate dropped to 110 within 2 minutes. Base rate this morning with any movement has been around 110.

## 2017-06-02 ENCOUNTER — Encounter (HOSPITAL_COMMUNITY)
Admission: EM | Disposition: A | Discharge status: Home Health | Payer: Self-pay | Source: Home / Self Care | Attending: Internal Medicine

## 2017-06-02 ENCOUNTER — Inpatient Hospital Stay (HOSPITAL_COMMUNITY): Payer: Medicare Other

## 2017-06-02 DIAGNOSIS — R933 Abnormal findings on diagnostic imaging of other parts of digestive tract: Secondary | ICD-10-CM

## 2017-06-02 DIAGNOSIS — R112 Nausea with vomiting, unspecified: Secondary | ICD-10-CM

## 2017-06-02 LAB — CBC
HEMATOCRIT: 37.2 % (ref 36.0–46.0)
HEMOGLOBIN: 11.6 g/dL — AB (ref 12.0–15.0)
MCH: 31 pg (ref 26.0–34.0)
MCHC: 31.2 g/dL (ref 30.0–36.0)
MCV: 99.5 fL (ref 78.0–100.0)
Platelets: 291 10*3/uL (ref 150–400)
RBC: 3.74 MIL/uL — AB (ref 3.87–5.11)
RDW: 14.7 % (ref 11.5–15.5)
WBC: 11.1 10*3/uL — AB (ref 4.0–10.5)

## 2017-06-02 LAB — BASIC METABOLIC PANEL
Anion gap: 8 (ref 5–15)
BUN: 14 mg/dL (ref 6–20)
CALCIUM: 9.1 mg/dL (ref 8.9–10.3)
CO2: 35 mmol/L — AB (ref 22–32)
CREATININE: 1.01 mg/dL — AB (ref 0.44–1.00)
Chloride: 96 mmol/L — ABNORMAL LOW (ref 101–111)
GFR, EST AFRICAN AMERICAN: 57 mL/min — AB (ref >60)
GFR, EST NON AFRICAN AMERICAN: 49 mL/min — AB (ref >60)
Glucose, Bld: 132 mg/dL — ABNORMAL HIGH (ref 65–99)
POTASSIUM: 4 mmol/L (ref 3.5–5.1)
Sodium: 139 mmol/L (ref 135–145)

## 2017-06-02 SURGERY — EGD (ESOPHAGOGASTRODUODENOSCOPY)
Anesthesia: Moderate Sedation

## 2017-06-02 MED ORDER — IOPAMIDOL (ISOVUE-300) INJECTION 61%
INTRAVENOUS | Status: AC
  Filled 2017-06-02: qty 300

## 2017-06-02 MED ORDER — METOPROLOL TARTRATE 25 MG PO TABS
25.0000 mg | ORAL_TABLET | Freq: Two times a day (BID) | ORAL | Status: DC
  Administered 2017-06-02 – 2017-06-04 (×5): 25 mg via ORAL
  Filled 2017-06-02 (×5): qty 1

## 2017-06-02 NOTE — Progress Notes (Signed)
PROGRESS NOTE    [name]  QVZ:[id number] DOB: 1928/10/16 DOA: 05/26/2017 PCP: Kathyrn Drown, MD    Brief Narrative:  83 year old female with a history of cavitary/interstitial lung disease, hypothyroidism, admitted to the hospital with rapid atrial fibrillation.  She had increasing palpitations and increased work of breathing for 4-5 days prior to admission.  Heart rate has been managed with intravenous Cardizem, metoprolol.  She is since been transitioned to an oral regimen and started on anticoagulation with Xarelto.  Cardiology has been following.  She is also had significant nausea, vomiting with associated dyspepsia.  GI consulted and she will undergo barium swallow on 11/19.  Had a pause of 5.4 seconds overnight on 11/18-11/19.   Assessment & Plan:   Active Problems:   Hypothyroidism   Depression with anxiety   Atrial fibrillation (HCC)   Chronic respiratory failure with hypoxia (HCC)   Interstitial lung disease ([place])   1. Atrial fibrillation rapid ventricular response.  Patient was initially started on intravenous Cardizem and heart rate began to improve.  She was transitioned to oral metoprolol and oral diltiazem and maintained stable heart rates.  Unfortunately, she reverted back into tachycardia and was started back on cardizem infusion.  This has since been weaned off again.  Overall heart rate is better.  Had a 5.4 pause overnight on 11/18.  Metoprolol dose has been decreased to 25 mg twice daily. Continue Cardizem 360 mg daily. She is anticoagulated with Xarelto.  Echocardiogram done with results as below.  Cardiology following. 2. Elevated troponin.  Appears to be nonspecific.  No complaints of chest pain.  She is ruled out for ACS. 3. Interstitial lung disease/chronic respiratory failure.  Noted to have fibrotic changes/cavitary lesions on imaging for over a year now.  Chronically wears 2 L of oxygen.  Respiratory status appears to be at baseline.  Case reviewed with  Dr. Luan Pulling.  The patient will need to be referred to him in order to be followed up as an outpatient 4. Hypothyroidism.  Continue on home dose of Synthroid 5. Nausea, vomiting, dyspepsia.  Patient reports having frequent "burping" episodes associated with nausea and vomiting.  She will throw up her medications.  She was started on a PPI without significant benefit.  She is very concerned about going home with this condition.  She has had this for quite some time now.  Swallow study done had indicated some possible esophageal component.  She has had gastrectomy in the past.  GI consulted to see if endoscopy is necessary prior to discharge.   DVT prophylaxis: Xarelto Code Status: Full code Family Communication: Discussed with patient and daughter at bedside and all questions answered Disposition Plan: Discharge home once improved; likely 24-48 hours   Consultants:   Cardiology  Gastroenterology  Procedures:  Echo:- Left ventricle: The cavity size was normal. Wall thickness was   increased in a pattern of mild LVH. Systolic function was normal.   The estimated ejection fraction was in the range of 55% to 60%.   Wall motion was normal; there were no regional wall motion   abnormalities. The study was not technically sufficient to allow   evaluation of LV diastolic dysfunction due to atrial   fibrillation. Doppler parameters are consistent with high   ventricular filling pressure. - Aortic valve: Trileaflet; mildly thickened, mildly calcified   leaflets. There was mild stenosis. There was mild regurgitation. - Mitral valve: Moderately calcified annulus. Mildly thickened   leaflets . There was mild regurgitation. -  Left atrium: The atrium was mildly to moderately dilated. - Atrial septum: No defect or patent foramen ovale was identified. - Tricuspid valve: Mildly thickened leaflets. There was moderate   regurgitation.  - Pulmonary arteries: PA peak pressure: 49 mm Hg  (S).  Antimicrobials:       Subjective: States she feels improved, no complaints other than weakness.  Objective: Vitals:   06/02/17 0800 06/02/17 0900 06/02/17 1000 06/02/17 1554  BP: 124/84     Pulse: 82 (!) 149 (!) 102   Resp: 14 (!) 23 20 (!) 21  Temp:    98.1 F (36.7 C)  TempSrc:    Oral  SpO2: 96% 98% 98%   Weight:      Height:        Intake/Output Summary (Last 24 hours) at 06/02/2017 1702 Last data filed at 06/02/2017 0838 Gross per 24 hour  Intake 480 ml  Output -  Net 480 ml   Filed Weights   05/31/17 0400 06/01/17 0500 06/02/17 0500  Weight: 36.3 kg (80 lb 0.4 oz) 36.8 kg (81 lb 2.1 oz) 36.9 kg (81 lb 5.6 oz)    Examination:  General exam: Alert, awake, oriented x 3 Respiratory system: Clear to auscultation. Respiratory effort normal. Cardiovascular system: Tachycardic, irregular Gastrointestinal system: Abdomen is nondistended, soft and nontender. No organomegaly or masses felt. Normal bowel sounds heard. Central nervous system: Alert and oriented. No focal neurological deficits. Extremities: No C/C/E, +pedal pulses Skin: No rashes, lesions or ulcers Psychiatry: Judgement and insight appear normal. Mood & affect appropriate.      Data Reviewed: I have personally reviewed following labs and imaging studies  CBC: Recent Labs  Lab 05/27/17 0419 05/28/17 0408 05/30/17 0545 06/01/17 0500 06/02/17 0421  WBC 10.4 11.0* 11.6* 10.4 11.1*  HGB 12.0 11.8* 11.9* 11.4* 11.6*  HCT 37.7 36.7 36.9 36.1 37.2  MCV 100.3* 100.0 99.5 99.4 99.5  PLT 325 320 304 294 921   Basic Metabolic Panel: Recent Labs  Lab 05/27/17 0419 06/02/17 0421  NA 138 139  K 3.6 4.0  CL 102 96*  CO2 30 35*  GLUCOSE 110* 132*  BUN 10 14  CREATININE 0.88 1.01*  CALCIUM 8.5* 9.1   GFR: Estimated Creatinine Clearance: 23.3 mL/min (A) (by C-G formula based on SCr of 1.01 mg/dL (H)). Liver Function Tests: No results for input(s): AST, ALT, ALKPHOS, BILITOT, PROT, ALBUMIN  in the last 168 hours. No results for input(s): LIPASE, AMYLASE in the last 168 hours. No results for input(s): AMMONIA in the last 168 hours. Coagulation Profile: No results for input(s): INR, PROTIME in the last 168 hours. Cardiac Enzymes: Recent Labs  Lab 05/26/17 1703 05/26/17 2311 05/27/17 0419  TROPONINI 0.04* 0.04* 0.04*   BNP (last 3 results) No results for input(s): PROBNP in the last 8760 hours. HbA1C: No results for input(s): HGBA1C in the last 72 hours. CBG: No results for input(s): GLUCAP in the last 168 hours. Lipid Profile: No results for input(s): CHOL, HDL, LDLCALC, TRIG, CHOLHDL, LDLDIRECT in the last 72 hours. Thyroid Function Tests: No results for input(s): TSH, T4TOTAL, FREET4, T3FREE, THYROIDAB in the last 72 hours. Anemia Panel: No results for input(s): VITAMINB12, FOLATE, FERRITIN, TIBC, IRON, RETICCTPCT in the last 72 hours. Sepsis Labs: No results for input(s): PROCALCITON, LATICACIDVEN in the last 168 hours.  Recent Results (from the past 240 hour(s))  MRSA PCR Screening     Status: None   Collection Time: 05/26/17  6:39 PM  Result Value Ref Range  Status   MRSA by PCR NEGATIVE NEGATIVE Final    Comment:        The GeneXpert MRSA Assay (FDA approved for NASAL specimens only), is one component of a comprehensive MRSA colonization surveillance program. It is not intended to diagnose MRSA infection nor to guide or monitor treatment for MRSA infections.          Radiology Studies: Dg Esophagus  Result Date: 06/02/2017 CLINICAL DATA:  83 year old female with history of dysphagia. Excessive belching. EXAM: ESOPHOGRAM / BARIUM SWALLOW / BARIUM TABLET STUDY TECHNIQUE: Single contrast examination was performed using thin barium or water soluble. The patient was observed with fluoroscopy swallowing a 13 mm barium sulphate tablet. FLUOROSCOPY TIME:  Fluoroscopy Time:  2 minutes and 54 seconds Radiation Exposure Index (if provided by the  fluoroscopic device): 6.2 mGy COMPARISON:  None. FINDINGS: Single contrast images of the esophagus demonstrate grossly abnormal esophageal motility with failure to propagate a normal primary peristaltic wave and extensive tertiary contractions. Distal esophagus appears dilated shortly above the gastroesophageal junction. Gastroesophageal junction appeared narrowed, with some adjacent surgical clips, which could indicate prior fundoplication procedure. Importantly, however, there was a persistent filling defect in the distal esophagus immediately above the gastroesophageal junction which could represent an underlying mass, or may simply reflect retained food products. A barium tablet was administered which failed to pass beyond this area of narrowing. IMPRESSION: 1. Nonspecific esophageal motility disorder with failure to propagate normal primary peristaltic waves and extensive tertiary contractions. 2. Narrowing of the esophagus in the region of the gastroesophageal junction. There are findings which could suggest prior fundoplication, however, the patient's surgical history is uncertain. Regardless, there is a persistent filling defect immediately above the gastroesophageal junction, concerning for potential neoplasm versus retained food products. Correlation with endoscopy is recommended if clinically appropriate. Electronically Signed   By: Vinnie Langton M.D.   On: 06/02/2017 12:30        Scheduled Meds: . citalopram  10 mg Oral Daily  . diltiazem  360 mg Oral Daily  . ipratropium  2 spray Each Nare Q12H  . levothyroxine  50 mcg Oral Once per day on Mon Fri   And  . levothyroxine  25 mcg Oral Once per day on Sun Tue Wed Thu Sat  . lipase/protease/amylase  24,000 Units Oral TID WC  . loperamide  2 mg Oral TID  . memantine  10 mg Oral BID  . metoprolol tartrate  25 mg Oral BID  . pantoprazole  40 mg Oral BID AC  . potassium chloride  10 mEq Oral Daily  . rivaroxaban  15 mg Oral Q supper  .  venlafaxine XR  37.5 mg Oral Q breakfast   Continuous Infusions: . diltiazem (CARDIZEM) infusion Stopped (05/30/17 1321)     LOS: 7 days    Time spent: 85mins    Lelon Frohlich, MD [facility] Pager [phone]  If 7PM-7AM, please contact night-coverage [email or web address] Password [email or web address] 06/02/2017, 5:02 PM

## 2017-06-02 NOTE — Progress Notes (Signed)
  Subjective:  Patient continues to complain of postprandial burping nausea and vomiting.  According to her helper the symptoms started about 8 days ago.  She also complains of dysphagia.  She points to the midsternal area as site of bolus obstruction.  She denies chest or abdominal pain.  Objective: Blood pressure (!) 143/103, pulse (!) 108, temperature 98.1 F (36.7 C), temperature source Oral, resp. rate 19, height 4\' 11"  (1.499 m), weight 81 lb 5.6 oz (36.9 kg), SpO2 (!) 62 %. Patient is alert and in no acute distress. Abdomen is flat.  She has pale thick stool in ileostomy bag.  Abdomen is soft and nontender.  Labs/studies Results:  Recent Labs    06-14-2017 0500 06/02/17 0421  WBC 10.4 11.1*  HGB 11.4* 11.6*  HCT 36.1 37.2  PLT 294 291    BMET  Recent Labs    06/02/17 0421  NA 139  K 4.0  CL 96*  CO2 35*  GLUCOSE 132*  BUN 14  CREATININE 1.01*  CALCIUM 9.1      Assessment:  #1.  Abnormal barium study.  Study suggests esophageal motility disorder, focal distal esophageal dilation just proximal to the stricture.  She possibly has had antireflux surgery in the past but this cannot be confirmed.  She may benefit from esophageal dilation.  #2.  Nausea and vomiting.  She has been having postprandial nausea and vomiting for about 8 days.  Symptoms may be due to worsening of her gastroparesis.  She could also have an anastomotic stricture.  #3.  History of steatorrhea.  She appears to have responded to pancreatic enzyme supplement.  Stool consistency has improved and volume has decreased.  She is back on pancreatic enzyme supplement.  #4.  Atrial fibrillation with RVR.  Rate is controlled with therapy.  Patient is on anticoagulant.  Last dose on 5 PM yesterday. Patient has mildly elevated troponin levels felt to be nonspecific and not indicative of acute coronary syndrome.  She has been evaluated by cardiology.   Plan:  Hold Rivaroxaban this evening. EGD possible ED in  am.

## 2017-06-02 NOTE — Plan of Care (Signed)
Care Plans progressing.

## 2017-06-02 NOTE — Progress Notes (Signed)
Progress Note  Patient Name: [name] Date of Encounter: 06/02/2017  Primary Cardiologist: Dr. Bronson Ing  Subjective   Breathing improved. No chest pain or palpitations. Still having difficulty swallowing.   Inpatient Medications    Scheduled Meds: . citalopram  10 mg Oral Daily  . diltiazem  360 mg Oral Daily  . ipratropium  2 spray Each Nare Q12H  . levothyroxine  50 mcg Oral Once per day on Mon Fri   And  . levothyroxine  25 mcg Oral Once per day on Sun Tue Wed Thu Sat  . lipase/protease/amylase  24,000 Units Oral TID WC  . loperamide  2 mg Oral TID  . memantine  10 mg Oral BID  . metoprolol tartrate  37.5 mg Oral BID  . pantoprazole  40 mg Oral BID AC  . potassium chloride  10 mEq Oral Daily  . rivaroxaban  15 mg Oral Q supper  . venlafaxine XR  37.5 mg Oral Q breakfast   Continuous Infusions: . diltiazem (CARDIZEM) infusion Stopped (05/30/17 1321)   PRN Meds: acetaminophen, diazepam, ipratropium, lipase/protease/amylase, ondansetron (ZOFRAN) IV, simethicone   Vital Signs    Vitals:   06/02/17 0200 06/02/17 0300 06/02/17 0400 06/02/17 0500  BP: (!) 117/56 (!) 124/57 (!) 107/52 (!) 130/46  Pulse: 68 71 64 83  Resp: 16 14 15 16   Temp:   98.1 F (36.7 C)   TempSrc:   Oral   SpO2: 97% 99% 98% 96%  Weight:    81 lb 5.6 oz (36.9 kg)  Height:        Intake/Output Summary (Last 24 hours) at 06/02/2017 0806 Last data filed at 06/01/2017 1704 Gross per 24 hour  Intake 720 ml  Output -  Net 720 ml   Filed Weights   05/31/17 0400 06/01/17 0500 06/02/17 0500  Weight: 80 lb 0.4 oz (36.3 kg) 81 lb 2.1 oz (36.8 kg) 81 lb 5.6 oz (36.9 kg)    Telemetry    Atrial fibrillation, HR variable from the 50's to 110's. 5.43 second pause overnight.  - Personally Reviewed  ECG    No new tracings.   Physical Exam   General: Well developed, frail-elderly Caucasian female appearing in no acute distress. Head: Normocephalic, atraumatic.  Neck: Supple without  bruits, JVD not elevated. Lungs:  Resp regular and unlabored, CTA without wheezing or rales. Heart: Irregularly irregular, S1, S2, no S3, S4, or murmur; no rub. Abdomen: Soft, non-tender, non-distended with normoactive bowel sounds. No hepatomegaly. No rebound/guarding. No obvious abdominal masses. Extremities: No clubbing, cyanosis, or edema. Distal pedal pulses are 2+ bilaterally. Neuro: Alert and oriented X 3. Moves all extremities spontaneously. Psych: Normal affect.  Labs    Chemistry Recent Labs  Lab 05/26/17 1317 05/27/17 0419 06/02/17 0421  NA 141 138 139  K 3.9 3.6 4.0  CL 102 102 96*  CO2 30 30 35*  GLUCOSE 115* 110* 132*  BUN 11 10 14   CREATININE 0.99 0.88 1.01*  CALCIUM 9.4 8.5* 9.1  PROT 8.5*  --   --   ALBUMIN 4.0  --   --   AST 26  --   --   ALT 12*  --   --   ALKPHOS 103  --   --   BILITOT 0.6  --   --   GFRNONAA 50* 58* 49*  GFRAA 58* >60 57*  ANIONGAP 9 6 8      Hematology Recent Labs  Lab 05/30/17 0545 06/01/17 0500 06/02/17 0421  WBC 11.6* 10.4 11.1*  RBC 3.71* 3.63* 3.74*  HGB 11.9* 11.4* 11.6*  HCT 36.9 36.1 37.2  MCV 99.5 99.4 99.5  MCH 32.1 31.4 31.0  MCHC 32.2 31.6 31.2  RDW 14.4 14.7 14.7  PLT 304 294 291    Cardiac Enzymes Recent Labs  Lab 05/26/17 1315 05/26/17 1703 05/26/17 2311 05/27/17 0419  TROPONINI 0.04* 0.04* 0.04* 0.04*   No results for input(s): TROPIPOC in the last 168 hours.   BNP Recent Labs  Lab 05/26/17 1703  BNP 1,803.0*     DDimer No results for input(s): DDIMER in the last 168 hours.   Radiology    No results found.  Cardiac Studies   Echocardiogram: 05/27/2017 Study Conclusions  - Left ventricle: The cavity size was normal. Wall thickness was   increased in a pattern of mild LVH. Systolic function was normal.   The estimated ejection fraction was in the range of 55% to 60%.   Wall motion was normal; there were no regional wall motion   abnormalities. The study was not technically  sufficient to allow   evaluation of LV diastolic dysfunction due to atrial   fibrillation. Doppler parameters are consistent with high   ventricular filling pressure. - Aortic valve: Trileaflet; mildly thickened, mildly calcified   leaflets. There was mild stenosis. There was mild regurgitation. - Mitral valve: Moderately calcified annulus. Mildly thickened   leaflets . There was mild regurgitation. - Left atrium: The atrium was mildly to moderately dilated. - Atrial septum: No defect or patent foramen ovale was identified. - Tricuspid valve: Mildly thickened leaflets. There was moderate   regurgitation. - Pulmonary arteries: PA peak pressure: 49 mm Hg (S).  Patient Profile     83 y.o. female w/ PMH of chronic interstitial lung disease, COPD, and hypothyroidism who presented to [facility] on 05/[facility]/2017 for evaluation of palpitations, found to be in atrial fibrillation with RVR.   Assessment & Plan    1. Rapid atrial flutter/fibrillation - HR was initially in the 130's - 140's and she required IV Cardizem. This has since been switched to PO Cardizem CD 360mg  daily along with the addition of Lopressor which has been titrated to 37.5mg  BID.  HR has been in the 50's to 110's but she was noted to have a 5.43 second pause overnight. Will reduce Lopressor to 25mg  BID. Consider event monitor at the time of discharge to evaluate for further episodes of bradycardia and sinus pauses as further medication adjustments may be necessary. If she is found to have recurrent tachy-brady episodes, would need to consider EP referral for advanced options such as PPM placement.  - on Xarelto 15mg  daily for anticoagulation (reduced dosing secondary to creatinine clearance of 25.6 mL/min).   2. Elevated Troponin  - cyclic values were flat at 0.04. - she denies any recent anginal symptoms and her echocardiogram shows a preserved EF of 55-60% with no regional WMA. No further ischemic evaluation indicated at this  time.   3. Aortic Stenosis - mild by echocardiogram this admission.  - continue to follow as an outpatient.   4. Chronic interstitial lung disease - on 2L [place] at baseline.  - This appears to be stable and is followed by Pulmonology.   5. Dysphagia - GI was consulted by the admitting team. Started on PPI therapy along with resuming her PTA pancreatic enzyme supplementation. Barium pill esophagram planned for later today.    For questions or updates, please contact [place] Please consult [email or web address] for  contact info under Cardiology/STEMI.   Arna Medici , PA-C 8:06 AM 06/02/2017 Pager: [phone]  Attending note Patient seen and discussed with PA Ahmed Prima, I agree with her documentation above. Admitted with new onset afib with RVR. Has been on oral dilt and lopressor for rate control, on xarelto for stroke prevention. Rate control has been complicated by intermittent tachycardia and bradycardia, with pauses up to 5.4 seconds overnight. Pauses all appear during night while presumably sleeping.  Lopressor lowered to 25mg  bid today. Xarelto started 05/27/17. Follow rates today, plan would be rate control <110 with controlled symptoms without prolonged pauses with rate control, likely outpatient EP follow with possible outpatient monitor.  Carlyle Dolly MD

## 2017-06-03 ENCOUNTER — Encounter (HOSPITAL_COMMUNITY)
Admission: EM | Disposition: A | Discharge status: Home Health | Payer: Self-pay | Source: Home / Self Care | Attending: Internal Medicine

## 2017-06-03 ENCOUNTER — Encounter (HOSPITAL_COMMUNITY): Payer: Self-pay | Admitting: *Deleted

## 2017-06-03 ENCOUNTER — Telehealth (INDEPENDENT_AMBULATORY_CARE_PROVIDER_SITE_OTHER): Payer: Self-pay | Admitting: Internal Medicine

## 2017-06-03 DIAGNOSIS — T182XXA Foreign body in stomach, initial encounter: Secondary | ICD-10-CM

## 2017-06-03 DIAGNOSIS — T18128A Food in esophagus causing other injury, initial encounter: Secondary | ICD-10-CM

## 2017-06-03 DIAGNOSIS — K449 Diaphragmatic hernia without obstruction or gangrene: Secondary | ICD-10-CM

## 2017-06-03 HISTORY — PX: FOREIGN BODY REMOVAL: SHX962

## 2017-06-03 HISTORY — PX: ESOPHAGOGASTRODUODENOSCOPY: SHX5428

## 2017-06-03 LAB — CBC
HCT: 38.7 % (ref 36.0–46.0)
Hemoglobin: 11.7 g/dL — ABNORMAL LOW (ref 12.0–15.0)
MCH: 30.9 pg (ref 26.0–34.0)
MCHC: 30.2 g/dL (ref 30.0–36.0)
MCV: 102.1 fL — ABNORMAL HIGH (ref 78.0–100.0)
PLATELETS: 286 10*3/uL (ref 150–400)
RBC: 3.79 MIL/uL — AB (ref 3.87–5.11)
RDW: 14.5 % (ref 11.5–15.5)
WBC: 12.2 10*3/uL — AB (ref 4.0–10.5)

## 2017-06-03 SURGERY — EGD (ESOPHAGOGASTRODUODENOSCOPY)
Anesthesia: Moderate Sedation

## 2017-06-03 MED ORDER — MEPERIDINE HCL 50 MG/ML IJ SOLN
INTRAMUSCULAR | Status: AC
  Filled 2017-06-03: qty 1

## 2017-06-03 MED ORDER — LIDOCAINE VISCOUS 2 % MT SOLN
OROMUCOSAL | Status: DC | PRN
  Administered 2017-06-03: 1 via OROMUCOSAL

## 2017-06-03 MED ORDER — METOCLOPRAMIDE HCL 5 MG/ML IJ SOLN
5.0000 mg | Freq: Three times a day (TID) | INTRAMUSCULAR | Status: AC
  Administered 2017-06-03 – 2017-06-04 (×4): 5 mg via INTRAVENOUS
  Filled 2017-06-03 (×4): qty 2

## 2017-06-03 MED ORDER — SODIUM CHLORIDE 0.9 % IV SOLN
INTRAVENOUS | Status: DC
  Administered 2017-06-03: 07:00:00 via INTRAVENOUS

## 2017-06-03 MED ORDER — RIVAROXABAN 15 MG PO TABS
15.0000 mg | ORAL_TABLET | Freq: Every day | ORAL | Status: DC
  Administered 2017-06-03: 15 mg via ORAL
  Filled 2017-06-03: qty 1

## 2017-06-03 MED ORDER — MIDAZOLAM HCL 5 MG/5ML IJ SOLN
INTRAMUSCULAR | Status: DC | PRN
  Administered 2017-06-03: 1 mg via INTRAVENOUS

## 2017-06-03 MED ORDER — MIDAZOLAM HCL 5 MG/5ML IJ SOLN
INTRAMUSCULAR | Status: AC
  Filled 2017-06-03: qty 10

## 2017-06-03 MED ORDER — LIDOCAINE VISCOUS 2 % MT SOLN
OROMUCOSAL | Status: AC
  Filled 2017-06-03: qty 15

## 2017-06-03 MED ORDER — MEPERIDINE HCL 50 MG/ML IJ SOLN
INTRAMUSCULAR | Status: DC | PRN
  Administered 2017-06-03: 15 mg via INTRAVENOUS

## 2017-06-03 NOTE — Progress Notes (Signed)
   Patient currently down in Endoscopy for EGD. Telemetry reviewed and showed persistent atrial fibrillation with rates in the 90's to low-100's during the day, dropping into the mid-40's to 50's overnight. Pauses up to 5.4 seconds were previously noted but with reduction in BB dosing, this has improved. She does have occasional pauses overnight but none greater than 2.41 seconds.   Would continue rate-control with Cardizem CD 360mg  daily and Lopressor 25mg  BID. Resume anticoagulation once safe from a GI perspective following her EGD. Consider event monitor as an outpatient.   Signed, Erma Heritage, PA-C 06/03/2017, 8:13 AM Pager: [phone]  Attending note Telemetry reviewed, overall rates are reasonably controlled, particularly given her tachy-brady syndrome. Continue current meds. We will continue to follow telemetry tomorrow   Zandra Abts MD

## 2017-06-03 NOTE — Progress Notes (Signed)
Brief EGD note.:  Normal mucosa of proximal and middle third. Focal dilation to distal esophagus containing food debris. Food debris was easily pushed into the stomach with scope.  No stricture noted. Small sliding hiatal hernia. Moderate amount of food debris in the stomach. Gastrojejunostomy not well seen. Esophageal dilation not performed.  Recommendations:  Clear liquids today. Metoclopramide 5 mg IV every 8 hours. Resume anticoagulant later today.

## 2017-06-03 NOTE — Op Note (Signed)
[facility] Patient Name: [name] Procedure Date: 06/03/2017 7:18 AM MRN: [id number] Date of Birth: 09-26-1928 Attending MD: Hildred Laser , MD CSN: [id number] Age: 83 Admit Type: Inpatient Procedure:                Upper GI endoscopy Indications:              Abnormal abdominal x-ray of the GI tract, Nausea                            with vomiting Providers:                Hildred Laser, MD, Charlsie Quest. Theda Sers RN, RN, Aram Candela Referring MD:             Rande Lawman, MD Medicines:                Lidocaine spray, Meperidine 15 mg IV, Midazolam 2                            mg IV Complications:            No immediate complications. Estimated Blood Loss:     Estimated blood loss: none. Procedure:                Pre-Anesthesia Assessment:                           - Prior to the procedure, a History and Physical                            was performed, and patient medications and                            allergies were reviewed. The patient's tolerance of                            previous anesthesia was also reviewed. The risks                            and benefits of the procedure and the sedation                            options and risks were discussed with the patient.                            All questions were answered, and informed consent                            was obtained. Prior Anticoagulants: The patient                            last took Xarelto (rivaroxaban) 2 days prior to the  procedure. ASA Grade Assessment: III - A patient                            with severe systemic disease. After reviewing the                            risks and benefits, the patient was deemed in                            satisfactory condition to undergo the procedure.                           After obtaining informed consent, the endoscope was                            passed under direct vision.  Throughout the                            procedure, the patient's blood pressure, pulse, and                            oxygen saturations were monitored continuously. The                            EG-[id number] ([id number]) scope was introduced through the                            mouth, and advanced to the antrum of the stomach.                            The EG-[id number] ([id number]) scope was introduced through                            the and advanced to the. The upper GI endoscopy was                            accomplished without difficulty. The patient                            tolerated the procedure well. Scope In: 7:50:12 AM Scope Out: 7:58:20 AM Total Procedure Duration: 0 hours 8 minutes 8 seconds  Findings:      The proximal esophagus and mid esophagus were normal.      Food was found in the distal esophagus. Removal of food was accomplished.      The Z-line was regular and was found 33 cm from the incisors.      A 2 cm hiatal hernia was present.      A medium amount of food (residue) was found in the gastric body and in       the gastric antrum.      Evidence of previous surgery was found in the gastric antrum. This was       characterized by {skip}not well seen because of food debris.      jejunum not examined Impression:               -  Normal proximal esophagus and mid esophagus.                           - Food in the distal esophagus. Removal was                            successful. It was easily pushed down.                           - Focal dilation noted to distal esophagus without                            stricture formation. These changes are felt to be                            due tomotility disorder.                           - Z-line regular, 33 cm from the incisors.                           - 2 cm hiatal hernia.                           - A medium amount of food (residue) in the stomach.                           - Evidence of previous gastric surgery was  found,                            characterized by gastrojejunostomy which was not                            well seen. Moderate Sedation:      Moderate (conscious) sedation was administered by the endoscopy nurse       and supervised by the endoscopist. The following parameters were       monitored: oxygen saturation, heart rate, blood pressure, CO2       capnography and response to care. Total physician intraservice time was       18 minutes. Recommendation:           - Return patient to ICU for ongoing care.                           - Clear liquid diet today.                           - Continue present medications.                           - Resume Xarelto (rivaroxaban) at prior dose today.                           - Metoclopramide 5 mg IV every 8 hours.                           -  Will resume domperidone at a low dose on                            outpatient basis. Procedure Code(s):        --- Professional ---                           [phone], Esophagogastroduodenoscopy, flexible,                            transoral; with removal of foreign body(s)                           99152, Moderate sedation services provided by the                            same physician or other qualified health care                            professional performing the diagnostic or                            therapeutic service that the sedation supports,                            requiring the presence of an independent trained                            observer to assist in the monitoring of the                            patient's level of consciousness and physiological                            status; initial 15 minutes of intraservice time,                            patient age 106 years or older Diagnosis Code(s):        --- Professional ---                           [id number], Food in esophagus causing other injury,                            initial encounter                            K44.9, Diaphragmatic hernia without obstruction or                            gangrene                           T18.2XXA, Foreign body in stomach, initial encounter  R11.2, Nausea with vomiting, unspecified                           R93.3, Abnormal findings on diagnostic imaging of                            other parts of digestive tract CPT copyright 2016 [employer]. All rights reserved. The codes documented in this report are preliminary and upon coder review may  be revised to meet current compliance requirements. Hildred Laser, MD Hildred Laser, MD 06/03/2017 8:21:43 AM This report has been signed electronically. Number of Addenda: 0

## 2017-06-03 NOTE — Care Management (Signed)
Xarelto Voucher given to patient and family at bedside.

## 2017-06-03 NOTE — Care Management Note (Signed)
Case Management Note  Patient Details  Name: [name] MRN: [id number] Date of Birth: 11-28-1928   If discussed at Long Length of Stay Meetings, dates discussed:   06/03/2017 Additional Comments:  Vernetta Dizdarevic, Chauncey Reading, RN 06/03/2017, 12:29 PM

## 2017-06-03 NOTE — Progress Notes (Signed)
Patient is having frequent pauses with HR dropping into the low 40s. Patient is asymptomatic at this time. MD notified. Will continue to monitor closely.

## 2017-06-03 NOTE — Telephone Encounter (Signed)
Patient's niece [name] presented to the office today and stated that Dr. Laural Golden told her to stop by and [name] would talk to her about domperidone medication.  Discussed with [name], patient fine with not waiting and [name] reaching back out to her to explain.  [phone]

## 2017-06-03 NOTE — Progress Notes (Signed)
PROGRESS NOTE    Shyenne Maggard Cutting  WUJ:[id number] DOB: 1929/07/02 DOA: 05/26/2017 PCP: Kathyrn Drown, MD    Brief Narrative:  83 year old female with a history of cavitary/interstitial lung disease, hypothyroidism, admitted to the hospital with rapid atrial fibrillation.  She had increasing palpitations and increased work of breathing for 4-5 days prior to admission.  Heart rate has been managed with intravenous Cardizem, metoprolol.  She is since been transitioned to an oral regimen and started on anticoagulation with Xarelto.  Cardiology has been following.  She is also had significant nausea, vomiting with associated dyspepsia.  GI consulted and she will undergo barium swallow on 11/19.  Had a pause of 5.4 seconds overnight on 11/18-11/19.  Overnight on 11/19-11/20 she has had some occasional pauses but none greater than 2.41 seconds.  EGD on 11/20 showed retained food contents, started on reglan.   Assessment & Plan:   Active Problems:   Hypothyroidism   Depression with anxiety   Atrial fibrillation (HCC)   Chronic respiratory failure with hypoxia (HCC)   Interstitial lung disease ([place])   1. Atrial fibrillation rapid ventricular response.  Patient was initially started on intravenous Cardizem and heart rate began to improve.  She was transitioned to oral metoprolol and oral diltiazem and maintained stable heart rates.  Unfortunately, she reverted back into tachycardia and was started back on cardizem infusion.  This has since been weaned off again.  Overall heart rate is better.  Had a 5.4 pause overnight on 11/18.  Metoprolol dose has been decreased to 25 mg twice daily. Continue Cardizem 360 mg daily. Still with some pauses overnight on 11/19, none greater than 2.41 seconds. Per cards, ok to continue with current medications. She is anticoagulated with Xarelto.  Echocardiogram done with results as below.  Cardiology following. 2. Elevated troponin.  Appears to be nonspecific.  No  complaints of chest pain.  She is ruled out for ACS. 3. Interstitial lung disease/chronic respiratory failure.  Noted to have fibrotic changes/cavitary lesions on imaging for over a year now.  Chronically wears 2 L of oxygen.  Respiratory status appears to be at baseline.  Case reviewed with Dr. Luan Pulling.  The patient will need to be referred to him in order to be followed up as an outpatient 4. Hypothyroidism.  Continue on home dose of Synthroid  5. Nausea, vomiting, dyspepsia.  Patient reports having frequent "burping" episodes associated with nausea and vomiting.  She will throw up her medications.  She was started on a PPI without significant benefit.  She is very concerned about going home with this condition.  She has had this for quite some time now.  Swallow study done had indicated some possible esophageal component.  Had EGD today with retained food contents. Dr. Laural Golden has started metoclopramide.  DVT prophylaxis: Xarelto Code Status: Full code Family Communication: Discussed with patient and daughter at bedside and all questions answered Disposition Plan: Discharge home once improved; likely 24-48 hours   Consultants:   Cardiology  Gastroenterology  Procedures:  Echo:- Left ventricle: The cavity size was normal. Wall thickness was   increased in a pattern of mild LVH. Systolic function was normal.   The estimated ejection fraction was in the range of 55% to 60%.   Wall motion was normal; there were no regional wall motion   abnormalities. The study was not technically sufficient to allow   evaluation of LV diastolic dysfunction due to atrial   fibrillation. Doppler parameters are consistent with high  ventricular filling pressure. - Aortic valve: Trileaflet; mildly thickened, mildly calcified   leaflets. There was mild stenosis. There was mild regurgitation. - Mitral valve: Moderately calcified annulus. Mildly thickened   leaflets . There was mild regurgitation. - Left  atrium: The atrium was mildly to moderately dilated. - Atrial septum: No defect or patent foramen ovale was identified. - Tricuspid valve: Mildly thickened leaflets. There was moderate   regurgitation.  - Pulmonary arteries: PA peak pressure: 49 mm Hg (S).  Antimicrobials:       Subjective: Very drowsy following EGD this am.  Objective: Vitals:   06/03/17 0805 06/03/17 0810 06/03/17 0815 06/03/17 1320  BP:    97/64  Pulse:    (!) 44  Resp:    18  Temp:    97.6 F (36.4 C)  TempSrc:      SpO2: 100% 100% 100% 100%  Weight:      Height:        Intake/Output Summary (Last 24 hours) at 06/03/2017 1634 Last data filed at 06/03/2017 0649 Gross per 24 hour  Intake 4.67 ml  Output -  Net 4.67 ml   Filed Weights   06/01/17 0500 06/02/17 0500 06/03/17 0500  Weight: 36.8 kg (81 lb 2.1 oz) 36.9 kg (81 lb 5.6 oz) 45.4 kg (100 lb 1.4 oz)    Examination:  General exam: Drowsy Respiratory system: Clear to auscultation. Respiratory effort normal. Cardiovascular system: Tachycardic, irregular Gastrointestinal system: Abdomen is nondistended, soft and nontender. No organomegaly or masses felt. Normal bowel sounds heard. Central nervous system: Alert and oriented. No focal neurological deficits. Extremities: No C/C/E, +pedal pulses Skin: No rashes, lesions or ulcers Psychiatry: Judgement and insight appear normal. Mood & affect appropriate.      Data Reviewed: I have personally reviewed following labs and imaging studies  CBC: Recent Labs  Lab 05/28/17 0408 05/30/17 0545 06/01/17 0500 06/02/17 0421 06/03/17 0510  WBC 11.0* 11.6* 10.4 11.1* 12.2*  HGB 11.8* 11.9* 11.4* 11.6* 11.7*  HCT 36.7 36.9 36.1 37.2 38.7  MCV 100.0 99.5 99.4 99.5 102.1*  PLT 320 304 294 291 161   Basic Metabolic Panel: Recent Labs  Lab 06/02/17 0421  NA 139  K 4.0  CL 96*  CO2 35*  GLUCOSE 132*  BUN 14  CREATININE 1.01*  CALCIUM 9.1   GFR: Estimated Creatinine Clearance: 27.3  mL/min (A) (by C-G formula based on SCr of 1.01 mg/dL (H)). Liver Function Tests: No results for input(s): AST, ALT, ALKPHOS, BILITOT, PROT, ALBUMIN in the last 168 hours. No results for input(s): LIPASE, AMYLASE in the last 168 hours. No results for input(s): AMMONIA in the last 168 hours. Coagulation Profile: No results for input(s): INR, PROTIME in the last 168 hours. Cardiac Enzymes: No results for input(s): CKTOTAL, CKMB, CKMBINDEX, TROPONINI in the last 168 hours. BNP (last 3 results) No results for input(s): PROBNP in the last 8760 hours. HbA1C: No results for input(s): HGBA1C in the last 72 hours. CBG: No results for input(s): GLUCAP in the last 168 hours. Lipid Profile: No results for input(s): CHOL, HDL, LDLCALC, TRIG, CHOLHDL, LDLDIRECT in the last 72 hours. Thyroid Function Tests: No results for input(s): TSH, T4TOTAL, FREET4, T3FREE, THYROIDAB in the last 72 hours. Anemia Panel: No results for input(s): VITAMINB12, FOLATE, FERRITIN, TIBC, IRON, RETICCTPCT in the last 72 hours. Sepsis Labs: No results for input(s): PROCALCITON, LATICACIDVEN in the last 168 hours.  Recent Results (from the past 240 hour(s))  MRSA PCR Screening  Status: None   Collection Time: 05/26/17  6:39 PM  Result Value Ref Range Status   MRSA by PCR NEGATIVE NEGATIVE Final    Comment:        The GeneXpert MRSA Assay (FDA approved for NASAL specimens only), is one component of a comprehensive MRSA colonization surveillance program. It is not intended to diagnose MRSA infection nor to guide or monitor treatment for MRSA infections.          Radiology Studies: Dg Esophagus  Result Date: 06/02/2017 CLINICAL DATA:  83 year old female with history of dysphagia. Excessive belching. EXAM: ESOPHOGRAM / BARIUM SWALLOW / BARIUM TABLET STUDY TECHNIQUE: Single contrast examination was performed using thin barium or water soluble. The patient was observed with fluoroscopy swallowing a 13 mm  barium sulphate tablet. FLUOROSCOPY TIME:  Fluoroscopy Time:  2 minutes and 54 seconds Radiation Exposure Index (if provided by the fluoroscopic device): 6.2 mGy COMPARISON:  None. FINDINGS: Single contrast images of the esophagus demonstrate grossly abnormal esophageal motility with failure to propagate a normal primary peristaltic wave and extensive tertiary contractions. Distal esophagus appears dilated shortly above the gastroesophageal junction. Gastroesophageal junction appeared narrowed, with some adjacent surgical clips, which could indicate prior fundoplication procedure. Importantly, however, there was a persistent filling defect in the distal esophagus immediately above the gastroesophageal junction which could represent an underlying mass, or may simply reflect retained food products. A barium tablet was administered which failed to pass beyond this area of narrowing. IMPRESSION: 1. Nonspecific esophageal motility disorder with failure to propagate normal primary peristaltic waves and extensive tertiary contractions. 2. Narrowing of the esophagus in the region of the gastroesophageal junction. There are findings which could suggest prior fundoplication, however, the patient's surgical history is uncertain. Regardless, there is a persistent filling defect immediately above the gastroesophageal junction, concerning for potential neoplasm versus retained food products. Correlation with endoscopy is recommended if clinically appropriate. Electronically Signed   By: Vinnie Langton M.D.   On: 06/02/2017 12:30        Scheduled Meds: . citalopram  10 mg Oral Daily  . diltiazem  360 mg Oral Daily  . ipratropium  2 spray Each Nare Q12H  . levothyroxine  50 mcg Oral Once per day on Mon Fri   And  . levothyroxine  25 mcg Oral Once per day on Sun Tue Wed Thu Sat  . lidocaine      . lipase/protease/amylase  24,000 Units Oral TID WC  . loperamide  2 mg Oral TID  . memantine  10 mg Oral BID  .  meperidine      . metoCLOPramide (REGLAN) injection  5 mg Intravenous Q8H  . metoprolol tartrate  25 mg Oral BID  . midazolam      . pantoprazole  40 mg Oral BID AC  . potassium chloride  10 mEq Oral Daily  . rivaroxaban  15 mg Oral Q supper  . venlafaxine XR  37.5 mg Oral Q breakfast   Continuous Infusions: . sodium chloride 20 mL/hr at 06/03/17 0635  . diltiazem (CARDIZEM) infusion Stopped (05/30/17 1321)     LOS: 8 days    Time spent: 5mins    Lelon Frohlich, MD [facility] Pager [phone]  If 7PM-7AM, please contact night-coverage [email or web address] Password [facility] 06/03/2017, 4:34 PM

## 2017-06-03 NOTE — Progress Notes (Signed)
[place] for xarelto Indication: atrial fibrillation  Allergies  Allergen Reactions  . Levaquin [Levofloxacin] Other (See Comments)    Patient states that she became weak and extremely tired. Also, it made her real dizzy.  . Aspirin Nausea And Vomiting    Nausea/vomiting blood  . Tetanus Toxoid Hives and Swelling  . Zolpidem Tartrate     Not in right state of mind  . Fosamax [Alendronate Sodium] Other (See Comments)    Severe gastritis reflux.    Patient Measurements: Height: 4\' 11"  (149.9 cm) Weight: 100 lb 1.4 oz (45.4 kg) IBW/kg (Calculated) : 43.2  Vital Signs: Temp: 97.8 F (36.6 C) (11/20 0659) Temp Source: Oral (11/20 0659) BP: 115/69 (11/20 0725) Pulse Rate: 95 (11/20 0725)  Labs: Recent Labs    06/01/17 0500 06/02/17 0421 06/03/17 0510  HGB 11.4* 11.6* 11.7*  HCT 36.1 37.2 38.7  PLT 294 291 286  CREATININE  --  1.01*  --     Estimated Creatinine Clearance: 27.3 mL/min (A) (by C-G formula based on SCr of 1.01 mg/dL (H)).   Assessment: 83 yo female admitted with palpitations. Xarelto to restart today.  S/P Upper GI endoscopy 11/20.  OK to resume anticoagulation per GI.  Goal of Therapy:  Monitor platelets by anticoagulation protocol: Yes   Plan:  Xarelto 15 mg po daily with supper Monitor for s/s of bleeding  Pricilla Larsson, RPH 06/03/2017,8:39 AM

## 2017-06-03 NOTE — Progress Notes (Signed)
Patient was a transfer from ICU to dept 300 room 6, placed on telemetry box #25. Vital signs stable. No c/o pain or discomfort noted. Report received from [place]. Family at bedside. Will continue to monitor patient.

## 2017-06-04 ENCOUNTER — Encounter (HOSPITAL_COMMUNITY): Payer: Self-pay | Admitting: Internal Medicine

## 2017-06-04 MED ORDER — RIVAROXABAN 15 MG PO TABS: 15.0000 mg | ORAL_TABLET | Freq: Every day | ORAL | 2 refills | Status: AC

## 2017-06-04 MED ORDER — METOCLOPRAMIDE HCL 5 MG PO TABS: 5.0000 mg | ORAL_TABLET | Freq: Three times a day (TID) | ORAL | 1 refills | Status: AC

## 2017-06-04 MED ORDER — DILTIAZEM HCL ER COATED BEADS 360 MG PO CP24: 360.0000 mg | ORAL_CAPSULE | Freq: Every day | ORAL | 2 refills | Status: DC

## 2017-06-04 NOTE — Progress Notes (Signed)
Patient reports no more dysphagia nausea or vomiting. She had no difficulty with her breakfast and now she is eating her lunch. She does not have extremity tremors or orolingual dyskinesia. Patient discussed with patient's niece who is at bedside. She will continue low-dose metoclopramide until she is able to obtain domperidone prescription. Domperidone prescription would be 5 mg before each meal or 5 mg p.o. twice daily. She has taken this medication in the past without any side effects. We will plan to see patient in the office in few weeks.

## 2017-06-04 NOTE — Discharge Summary (Signed)
Physician Discharge Summary  [name] ZSW:[id number] DOB: 1929/04/22 DOA: 05/26/2017  PCP: [name] Drown, MD  Admit date: 05/26/2017 Discharge date: 06/04/2017  Time spent: 45 minutes  Recommendations for Outpatient Follow-up:  -Will be discharged home today. -Advised to follow up with PCP and cardiology in 2 weeks.   Discharge Diagnoses:  Active Problems:   Hypothyroidism   Depression with anxiety   Atrial fibrillation (HCC)   Chronic respiratory failure with hypoxia (HCC)   Interstitial lung disease ([place])   Discharge Condition: Stable and improved  Filed Weights   06/02/17 0500 06/03/17 0500 06/04/17 0606  Weight: 36.9 kg (81 lb 5.6 oz) 45.4 kg (100 lb 1.4 oz) 37.7 kg (83 lb 3.2 oz)    History of present illness:  As per Dr. Ernestina [name] on 11/12: [name] is a 83 y.o. female with medical history significant of anemia, cavitary lung disease, IBS, lung mass presenting w/ atrial fibrillation w/ RVR. Per report from pt and caregiver pt has had recurrent episodes of heart palpitations and increased WOB over last 4-5 days. No fevers or chills. No CP.  Does report drinking coffee twice daily.  ED Course: Presented to ER afebrile, HR into 120s. EKG w atrial flutter 2:1 block vs. afib w/ RVR. WBC 13.4. CXR w/ chronic interstitial lung changes. Intermittently hypoxic in setting of ILD.     Hospital Course:   1. Atrial fibrillation rapid ventricular response.  Patient was initially started on intravenous Cardizem and heart rate began to improve.  She was transitioned to oral metoprolol and oral diltiazem and maintained stable heart rates.  Unfortunately, she reverted back into tachycardia and was started back on cardizem infusion.  This has since been weaned off again.  Overall heart rate is much improved.  Had a 5.4 pause overnight on 11/18.  Metoprolol dose has been decreased to 25 mg twice daily. Continue Cardizem 360 mg daily. Still with some pauses overnight on 11/19,  none greater than 2.41 seconds. Per cards, ok to continue with current medications. Overnight 11/20/-11/21 no pauses of over 2 seconds. She is anticoagulated with Xarelto.  Echocardiogram done with results as below.  Cardiology following. 2. Elevated troponin.  Appears to be nonspecific.  No complaints of chest pain.  She is ruled out for ACS. 3. Interstitial lung disease/chronic respiratory failure.  Noted to have fibrotic changes/cavitary lesions on imaging for over a year now.  Chronically wears 2 L of oxygen.  Respiratory status appears to be at baseline.  Case reviewed with Dr. Luan Pulling.  The patient will need to be referred to him in order to be followed up as an outpatient (done) 4. Hypothyroidism.  Continue on home dose of Synthroid  5. Nausea, vomiting, dyspepsia.  Patient reports having frequent "burping" episodes associated with nausea and vomiting.  She will throw up her medications.  She was started on a PPI without significant benefit.  She is very concerned about going home with this condition.  She has had this for quite some time now.  Swallow study done had indicated some possible esophageal component.  Had EGD today with retained food contents. Dr. Laural Golden has started metoclopramide. She is improved and will DC on reglan.    Procedures:  EGD as above   ECHO: EF 55-60%, no WMA  Consultations:  GI  Cardiology  Discharge Instructions  Discharge Instructions    Diet - low sodium heart healthy   Complete by:  As directed    Increase activity slowly  Complete by:  As directed      Allergies as of 06/04/2017      Reactions   Levaquin [levofloxacin] Other (See Comments)   Patient states that she became weak and extremely tired. Also, it made her real dizzy.   Aspirin Nausea And Vomiting   Nausea/vomiting blood   Tetanus Toxoid Hives, Swelling   Zolpidem Tartrate    Not in right state of mind   Fosamax [alendronate Sodium] Other (See Comments)   Severe gastritis  reflux.      Medication List    TAKE these medications   acetaminophen 325 MG tablet Commonly known as:  TYLENOL Take 2 tablets (650 mg total) by mouth every 6 (six) hours as needed for mild pain or headache (or Fever >/= 101).   chlorpheniramine-HYDROcodone 10-8 MG/5ML Suer Commonly known as:  TUSSIONEX PENNKINETIC ER Take 5 mLs by mouth every 12 (twelve) hours as needed for cough.   citalopram 10 MG tablet Commonly known as:  CELEXA TAKE (1) TABLET BY MOUTH ONCE DAILY.   diazepam 2 MG tablet Commonly known as:  VALIUM Take 1 tablets twice a daily as needed. Take sparingly (Prescription must last 30 days)   diltiazem 360 MG 24 hr capsule Commonly known as:  CARDIZEM CD Take 1 capsule (360 mg total) by mouth daily. Start taking on:  06/05/2017   GAS RELIEF PO Take 1 tablet by mouth daily as needed (for gas relief).   ipratropium 0.03 % nasal spray Commonly known as:  ATROVENT Place 2 sprays into both nostrils every 12 (twelve) hours.   ipratropium 17 MCG/ACT inhaler Commonly known as:  ATROVENT HFA 2 PUFFS FOUR TIMES DAILY. What changed:    how much to take  how to take this  when to take this  additional instructions   levothyroxine 25 MCG tablet Commonly known as:  SYNTHROID, LEVOTHROID TAKE 2 TABLETS BY MOUTH ON [place]. & 1 TABLET ALL OTHER DAYS   lipase/protease/amylase 12000 units Cpep capsule Commonly known as:  CREON TAKE 2 CAPSULES BY MOUTH 3 TIMES DAILY WITH MEALS. TAKE 1 CAPSULE TWICE DAILY WITH SNACKS   loperamide 2 MG capsule Commonly known as:  IMODIUM TAKE (1) CAPSULE THREE TIMES DAILY BEFORE MEALS.   Melatonin 5 MG Caps Take 5 mg by mouth at bedtime.   memantine 10 MG tablet Commonly known as:  NAMENDA TAKE 1 TABLET BY MOUTH TWICE DAILY.   metoCLOPramide 5 MG tablet Commonly known as:  REGLAN Take 1 tablet (5 mg total) by mouth 4 (four) times daily -  before meals and at bedtime.   metoprolol tartrate 25 MG  tablet Commonly known as:  LOPRESSOR TAKE 1/2 TABLET TWICE DAILY FOR HIGH BLOOD PRESSURE.   mupirocin ointment 2 % Commonly known as:  BACTROBAN Apply to affected area 2 times daily   OXYGEN Inhale into the lungs. 2 liters -Nasal [place]   potassium chloride 10 MEQ tablet Commonly known as:  K-DUR TAKE (1) TABLET BY MOUTH ONCE DAILY.   Rivaroxaban 15 MG Tabs tablet Commonly known as:  XARELTO Take 1 tablet (15 mg total) by mouth daily with supper.   ROBITUSSIN NIGHTTIME COUGH DM 12.5-30 MG/10ML Liqd Generic drug:  Doxylamine-DM Take 5 mLs by mouth daily as needed (for cough). Patient taked 1 teaspoon as needed.   venlafaxine XR 37.5 MG 24 hr capsule Commonly known as:  EFFEXOR-XR TAKE 1 CAPSULE BY MOUTH ONCE DAILY WITH BREAKFAST What changed:  Another medication with the same name was  removed. Continue taking this medication, and follow the directions you see here.      Allergies  Allergen Reactions  . Levaquin [Levofloxacin] Other (See Comments)    Patient states that she became weak and extremely tired. Also, it made her real dizzy.  . Aspirin Nausea And Vomiting    Nausea/vomiting blood  . Tetanus Toxoid Hives and Swelling  . Zolpidem Tartrate     Not in right state of mind  . Fosamax [Alendronate Sodium] Other (See Comments)    Severe gastritis reflux.   Follow-up Information    Lendon Colonel, NP. Go on 06/19/2017.   Specialties:  Nurse Practitioner, Radiology, Cardiology Why:  Appointment with Jory Sims Jun 19, 2017 at 2:30 pm Contact information: [place] [address] [phone]        [name] Drown, MD. Schedule an appointment as soon as possible for a visit in 2 week(s).   Specialty:  Family Medicine Contact information: [address] [place] [address] [phone]            The results of significant diagnostics from this hospitalization (including imaging, microbiology, ancillary and laboratory) are  listed below for reference.    Significant Diagnostic Studies: Dg Chest 2 View  Result Date: 05/26/2017 CLINICAL DATA:  Chest pain.  Cavitary lung disease. EXAM: CHEST  2 VIEW COMPARISON:  04/22/2017 FINDINGS: Heart size remains normal. Chronic aortic atherosclerosis. Chronic lung disease with scarring and cavity formation as seen previously. Pulmonary density appears similar. No sign of consolidation or fluid accumulation within the cystic areas. No pleural effusions. No acute bone finding. IMPRESSION: Advanced chronic lung disease with scarring and areas of cavitation. No sign of identifiable acute inflammation. A would certainly be possible for minor inflammatory changes to be hidden amongst the chronic densities. Electronically Signed   By: Nelson Chimes M.D.   On: 05/26/2017 13:10   Dg Esophagus  Result Date: 06/02/2017 CLINICAL DATA:  83 year old female with history of dysphagia. Excessive belching. EXAM: ESOPHOGRAM / BARIUM SWALLOW / BARIUM TABLET STUDY TECHNIQUE: Single contrast examination was performed using thin barium or water soluble. The patient was observed with fluoroscopy swallowing a 13 mm barium sulphate tablet. FLUOROSCOPY TIME:  Fluoroscopy Time:  2 minutes and 54 seconds Radiation Exposure Index (if provided by the fluoroscopic device): 6.2 mGy COMPARISON:  None. FINDINGS: Single contrast images of the esophagus demonstrate grossly abnormal esophageal motility with failure to propagate a normal primary peristaltic wave and extensive tertiary contractions. Distal esophagus appears dilated shortly above the gastroesophageal junction. Gastroesophageal junction appeared narrowed, with some adjacent surgical clips, which could indicate prior fundoplication procedure. Importantly, however, there was a persistent filling defect in the distal esophagus immediately above the gastroesophageal junction which could represent an underlying mass, or may simply reflect retained food products. A  barium tablet was administered which failed to pass beyond this area of narrowing. IMPRESSION: 1. Nonspecific esophageal motility disorder with failure to propagate normal primary peristaltic waves and extensive tertiary contractions. 2. Narrowing of the esophagus in the region of the gastroesophageal junction. There are findings which could suggest prior fundoplication, however, the patient's surgical history is uncertain. Regardless, there is a persistent filling defect immediately above the gastroesophageal junction, concerning for potential neoplasm versus retained food products. Correlation with endoscopy is recommended if clinically appropriate. Electronically Signed   By: Vinnie Langton M.D.   On: 06/02/2017 12:30   Dg Swallowing Func-speech Pathology  Result Date: 05/29/2017 Objective Swallowing Evaluation: Type  of Study: MBS-Modified Barium Swallow Study  Patient Details Name: DYLLAN KATS MRN: [id number] Date of Birth: [birth year] Today's Date: 05/29/2017 Time: SLP Start Time (ACUTE ONLY): 1100 -SLP Stop Time (ACUTE ONLY): 1130 SLP Time Calculation (min) (ACUTE ONLY): 30 min Past Medical History: Past Medical History: Diagnosis Date . Anemia  . Cavitary lung disease 03/15/2015 . Chronic abdominal pain  . Chronic neck and back pain  . COPD (chronic obstructive pulmonary disease) ([place])  . GAD (generalized anxiety disorder)  . Gastroesophageal reflux disease  . Gastroparesis  . IBS (irritable bowel syndrome)  . Lung mass  . Migraines  . Osteoporosis  . 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 Renal insufficiency  . S/P colostomy ([place])  . Tachycardia  Past Surgical History: Past Surgical History: Procedure Laterality Date . ABDOMINAL HYSTERECTOMY   . APPENDECTOMY   . CHOLECYSTECTOMY   . COLOSTOMY   . ESOPHAGOGASTRODUODENOSCOPY   . ESOPHAGOGASTRODUODENOSCOPY N/A 08/25/2015  Procedure: ESOPHAGOGASTRODUODENOSCOPY (EGD);  Surgeon: Rogene Houston, MD;  [facility];  Service: Endoscopy;  Laterality: N/A;  730 .  ILEOSCOPY N/A 08/25/2015  Procedure: ILEOSCOPY THROUGH STOMA;  Surgeon: Rogene Houston, MD;  Location: [facility];  Service: Endoscopy;  Laterality: N/A; . PARTIAL GASTRECTOMY  1967  3/4 gastric resection for bleeding ulcers . TONSILLECTOMY   HPI: 83 year old female with a history of cavitary lung disease, hypothyroidism, admitted to the hospital with rapid atrial fibrillation. She had increasing palpitations and increased work of breathing for 4-5 days prior to admission. She was admitted to the stepdown unit, started on a Cardizem infusion. Cardiology following. Pt was schedule for outpatient MBSS on 05/26/17, but was admitted to hospital before it could be completed. MBSS ordered as inpatient by Dr. Bronson Ing.  Subjective: "I feel like I may throw up." She had ileostomy several years ago for colonic obstruction secondary to Alosetron and extensive adhesions. She has remote history of TB and cavitary lung disease UGI with small bowel 08/14/2015: Esophageal dysmotility compatible with age. Prior subtotal gastrectomy with small gastric pouch. Focal narrowing at the gastrojejunostomy anastomosis, long and tapered proximally with shouldering distally raising question ofmass/tumor ; endoscopic assessment recommended. She therefore underwent EGD and ileoscopy on 08/25/2015. EGD revealed small sliding hiatal hernia and mild changes of reflux esophagitis limited gastric junction. Small gastric pouch with patent gastro-jejunostomy. Nonspecific finding of jejunal mucosal edema but biopsy was unremarkable. Current chest x-ray: Advanced chronic lung disease with scarring and areas of cavitation. Assessment / Plan / Recommendation CHL IP CLINICAL IMPRESSIONS 05/29/2017 Clinical Impression Pt presents with mild/mod oral phase dysphagia characterized by impaired mastication due to edentulous status and weak lingual manipulation resulting in prolonged oral phase and piecemeal deglutition. Pharyngeal phase is essentially WNL  with flash penetration of thins during sequential cup sips with immediate removal and no aspiration observed. Suspect primary esophageal phase dysphagia as Pt noted to have distended, air-filled esophagus. Pt immediately vomited following sips of thin barium (started as belching and then vomited in four heaves- appeared to be breakfast and medication). MBSS was somewhat limited in intake due to Pt belching, regurgitation, and vomiting. Esophageal sweep completed several times throughout the study revealed esophageal distention and bird-beaking (this occurred after liquids appeared to backflow from stomach into esophagus). Would like radiologist to review imaging (Series #11, 12, and 13 labeled as cup thin sweep). Recommend D3/mech soft and thin liquids. Pt already consumes frequent, small meals due to previous gastrectomy. Pt appears at highest risk for post prandial aspiration at this time given  regurgitation and suspected primary esophageal dysphagia. Reflux precautions should be adhered to.  SLP Visit Diagnosis Dysphagia, oral phase (R13.11);Dysphagia, pharyngoesophageal phase (R13.14) Attention and concentration deficit following -- Frontal lobe and executive function deficit following -- Impact on safety and function Mild aspiration risk;Risk for inadequate nutrition/hydration   CHL IP TREATMENT RECOMMENDATION 05/29/2017 Treatment Recommendations No treatment recommended at this time   Prognosis 05/29/2017 Prognosis for Safe Diet Advancement Fair Barriers to Reach Goals (No Data) Barriers/Prognosis Comment -- CHL IP DIET RECOMMENDATION 05/29/2017 SLP Diet Recommendations Dysphagia 3 (Mech soft) solids;Thin liquid Liquid Administration via Cup Medication Administration Whole meds with puree Compensations Slow rate Postural Changes Remain semi-upright after after feeds/meals (Comment);Seated upright at 90 degrees   CHL IP OTHER RECOMMENDATIONS 05/29/2017 Recommended Consults Consider GI evaluation;Consider  esophageal assessment Oral Care Recommendations Oral care BID Other Recommendations Clarify dietary restrictions   CHL IP FOLLOW UP RECOMMENDATIONS 05/29/2017 Follow up Recommendations None   CHL IP FREQUENCY AND DURATION 12/21/2015 Speech Therapy Frequency (ACUTE ONLY) min 1 x/week Treatment Duration 1 week      CHL IP ORAL PHASE 05/29/2017 Oral Phase Impaired Oral - Pudding Teaspoon -- Oral - Pudding Cup -- Oral - Honey Teaspoon -- Oral - Honey Cup -- Oral - Nectar Teaspoon -- Oral - Nectar Cup -- Oral - Nectar Straw -- Oral - Thin Teaspoon -- Oral - Thin Cup -- Oral - Thin Straw -- Oral - Puree -- Oral - Mech Soft Impaired mastication;Weak lingual manipulation;Piecemeal swallowing;Delayed oral transit Oral - Regular -- Oral - Multi-Consistency -- Oral - Pill -- Oral Phase - Comment --  CHL IP PHARYNGEAL PHASE 05/29/2017 Pharyngeal Phase Impaired Pharyngeal- Pudding Teaspoon -- Pharyngeal -- Pharyngeal- Pudding Cup -- Pharyngeal -- Pharyngeal- Honey Teaspoon -- Pharyngeal -- Pharyngeal- Honey Cup -- Pharyngeal -- Pharyngeal- Nectar Teaspoon -- Pharyngeal -- Pharyngeal- Nectar Cup -- Pharyngeal -- Pharyngeal- Nectar Straw -- Pharyngeal -- Pharyngeal- Thin Teaspoon -- Pharyngeal -- Pharyngeal- Thin Cup Delayed swallow initiation-vallecula;Penetration/Aspiration during swallow Pharyngeal Material does not enter airway;Material enters airway, remains ABOVE vocal cords then ejected out Pharyngeal- Thin Straw -- Pharyngeal -- Pharyngeal- Puree WFL;Delayed swallow initiation-vallecula Pharyngeal -- Pharyngeal- Mechanical Soft Delayed swallow initiation-vallecula;WFL Pharyngeal -- Pharyngeal- Regular -- Pharyngeal -- Pharyngeal- Multi-consistency -- Pharyngeal -- Pharyngeal- Pill WFL Pharyngeal -- Pharyngeal Comment Essentially WNL for age  CHL IP CERVICAL ESOPHAGEAL PHASE 05/29/2017 Cervical Esophageal Phase Impaired Pudding Teaspoon -- Pudding Cup -- Honey Teaspoon -- Honey Cup -- Nectar Teaspoon -- Nectar Cup -- Nectar  Straw -- Thin Teaspoon -- Thin Cup Other (Comment) Thin Straw -- Puree -- Mechanical Soft -- Regular -- Multi-consistency -- Pill -- Cervical Esophageal Comment -- No flowsheet data found. PORTER,DABNEY 05/29/2017, 6:38 PM               Microbiology: Recent Results (from the past 240 hour(s))  MRSA PCR Screening     Status: None   Collection Time: 05/26/17  6:39 PM  Result Value Ref Range Status   MRSA by PCR NEGATIVE NEGATIVE Final    Comment:        The GeneXpert MRSA Assay (FDA approved for NASAL specimens only), is one component of a comprehensive MRSA colonization surveillance program. It is not intended to diagnose MRSA infection nor to guide or monitor treatment for MRSA infections.      Labs: Basic Metabolic Panel: Recent Labs  Lab 06/02/17 0421  NA 139  K 4.0  CL 96*  CO2 35*  GLUCOSE 132*  BUN 14  CREATININE 1.01*  CALCIUM 9.1   Liver Function Tests: No results for input(s): AST, ALT, ALKPHOS, BILITOT, PROT, ALBUMIN in the last 168 hours. No results for input(s): LIPASE, AMYLASE in the last 168 hours. No results for input(s): AMMONIA in the last 168 hours. CBC: Recent Labs  Lab 05/30/17 0545 06/01/17 0500 06/02/17 0421 06/03/17 0510  WBC 11.6* 10.4 11.1* 12.2*  HGB 11.9* 11.4* 11.6* 11.7*  HCT 36.9 36.1 37.2 38.7  MCV 99.5 99.4 99.5 102.1*  PLT 304 294 291 286   Cardiac Enzymes: No results for input(s): CKTOTAL, CKMB, CKMBINDEX, TROPONINI in the last 168 hours. BNP: BNP (last 3 results) Recent Labs    05/26/17 1703  BNP 1,803.0*    ProBNP (last 3 results) No results for input(s): PROBNP in the last 8760 hours.  CBG: No results for input(s): GLUCAP in the last 168 hours.     Signed:  Lelon Frohlich  [facility] Pager: [phone] 06/04/2017, 5:36 PM

## 2017-06-04 NOTE — Care Management Note (Addendum)
Case Management Note  Patient Details  Name: [name] MRN: [id number] Date of Birth: [birth year]  Expected Discharge Date:  06/04/17               Expected Discharge Plan:  [place]  Discharge planning Services  CM Consult  Post Acute Care Choice:  Home Health Choice offered to:  Patient, Adult Children  HH Arranged:  PT [place] Agency:  [place]  Status of Service:  Completed, signed off  Additional Comments: Discharging home today with [facility] PT through [facility]. Has Oxygen pta. She is aware HH has 48 hrs to make first visit. Vaughan Basta, [facility] rep, aware of referral and will pull pt info from chart. Pt's daughter at bedside, will provide transport home. Referred for enrollment in [place] transition calls.   Sherald Barge, RN 06/04/2017, 12:32 PM

## 2017-06-04 NOTE — Telephone Encounter (Signed)
I have talked with [name] and she is coming to pick up the information.

## 2017-06-04 NOTE — Progress Notes (Signed)
Patient states understanding of discharge instructions, prescriptions given 

## 2017-06-04 NOTE — Care Management Important Message (Signed)
Important Message  Patient Details  Name: [name] MRN: [id number] Date of Birth: 21-Nov-1928   Medicare Important Message Given:  Yes    Sherald Barge, RN 06/04/2017, 12:36 PM

## 2017-06-04 NOTE — Progress Notes (Signed)
    Reviewed telemetry. Patient remains in atrial fibrillation with HR in the 80's to low-100's. No pauses greater than 2 seconds noted. Continue current rate-controlling medications along with Xarelto.    Signed, Erma Heritage, PA-C 06/04/2017, 7:34 AM Pager: [phone]  Attending note  Tele reviewed, aggree with assessment above. No med changes, we will sign off, call back with questions   Zandra Abts MD

## 2017-06-09 ENCOUNTER — Telehealth: Payer: Self-pay | Admitting: Family Medicine

## 2017-06-09 ENCOUNTER — Telehealth (HOSPITAL_COMMUNITY): Payer: Self-pay | Admitting: Speech Pathology

## 2017-06-09 DIAGNOSIS — J9611 Chronic respiratory failure with hypoxia: Secondary | ICD-10-CM | POA: Diagnosis not present

## 2017-06-09 DIAGNOSIS — M810 Age-related osteoporosis without current pathological fracture: Secondary | ICD-10-CM | POA: Diagnosis not present

## 2017-06-09 DIAGNOSIS — I4891 Unspecified atrial fibrillation: Secondary | ICD-10-CM | POA: Diagnosis not present

## 2017-06-09 DIAGNOSIS — G8929 Other chronic pain: Secondary | ICD-10-CM | POA: Diagnosis not present

## 2017-06-09 DIAGNOSIS — K449 Diaphragmatic hernia without obstruction or gangrene: Secondary | ICD-10-CM | POA: Diagnosis not present

## 2017-06-09 DIAGNOSIS — J849 Interstitial pulmonary disease, unspecified: Secondary | ICD-10-CM | POA: Diagnosis not present

## 2017-06-09 DIAGNOSIS — Z903 Acquired absence of stomach [part of]: Secondary | ICD-10-CM | POA: Diagnosis not present

## 2017-06-09 DIAGNOSIS — Z933 Colostomy status: Secondary | ICD-10-CM | POA: Diagnosis not present

## 2017-06-09 DIAGNOSIS — J984 Other disorders of lung: Secondary | ICD-10-CM | POA: Diagnosis not present

## 2017-06-09 DIAGNOSIS — Z9981 Dependence on supplemental oxygen: Secondary | ICD-10-CM | POA: Diagnosis not present

## 2017-06-09 DIAGNOSIS — G43909 Migraine, unspecified, not intractable, without status migrainosus: Secondary | ICD-10-CM | POA: Diagnosis not present

## 2017-06-09 DIAGNOSIS — J449 Chronic obstructive pulmonary disease, unspecified: Secondary | ICD-10-CM | POA: Diagnosis not present

## 2017-06-09 DIAGNOSIS — Z8611 Personal history of tuberculosis: Secondary | ICD-10-CM | POA: Diagnosis not present

## 2017-06-09 DIAGNOSIS — F418 Other specified anxiety disorders: Secondary | ICD-10-CM | POA: Diagnosis not present

## 2017-06-09 NOTE — Telephone Encounter (Signed)
[facility]) needing a verbal order to give patient therapy twice a week for three weeks.[phone]

## 2017-06-09 NOTE — Telephone Encounter (Signed)
Left verbal order on Stacey's voicemail.

## 2017-06-09 NOTE — Telephone Encounter (Signed)
Tried to call pt to cx apptment for MBSS was unable to l/m; the mail box was full.  [name]

## 2017-06-09 NOTE — Telephone Encounter (Signed)
Please give verbal order to do so send any paperwork our way we will be happy to sign it

## 2017-06-11 ENCOUNTER — Telehealth (HOSPITAL_COMMUNITY): Payer: Self-pay | Admitting: Speech Pathology

## 2017-06-11 NOTE — Telephone Encounter (Signed)
Caregiver called to confirm that MBSS had been cx.

## 2017-06-12 ENCOUNTER — Inpatient Hospital Stay (HOSPITAL_COMMUNITY)
Admission: EM | Admit: 2017-06-12 | Discharge: 2017-06-20 | DRG: 308 | Disposition: A | Discharge status: Hospice | Payer: Medicare Other | Attending: Internal Medicine | Admitting: Internal Medicine

## 2017-06-12 ENCOUNTER — Encounter (HOSPITAL_COMMUNITY): Payer: Self-pay | Admitting: Emergency Medicine

## 2017-06-12 ENCOUNTER — Encounter (HOSPITAL_COMMUNITY): Payer: Medicare Other | Admitting: Speech Pathology

## 2017-06-12 ENCOUNTER — Other Ambulatory Visit: Payer: Self-pay

## 2017-06-12 ENCOUNTER — Emergency Department (HOSPITAL_COMMUNITY): Payer: Medicare Other

## 2017-06-12 DIAGNOSIS — E869 Volume depletion, unspecified: Secondary | ICD-10-CM | POA: Diagnosis not present

## 2017-06-12 DIAGNOSIS — T502X5A Adverse effect of carbonic-anhydrase inhibitors, benzothiadiazides and other diuretics, initial encounter: Secondary | ICD-10-CM | POA: Diagnosis present

## 2017-06-12 DIAGNOSIS — I484 Atypical atrial flutter: Secondary | ICD-10-CM

## 2017-06-12 DIAGNOSIS — I4891 Unspecified atrial fibrillation: Secondary | ICD-10-CM | POA: Diagnosis not present

## 2017-06-12 DIAGNOSIS — I495 Sick sinus syndrome: Principal | ICD-10-CM | POA: Diagnosis present

## 2017-06-12 DIAGNOSIS — G4700 Insomnia, unspecified: Secondary | ICD-10-CM | POA: Diagnosis present

## 2017-06-12 DIAGNOSIS — J449 Chronic obstructive pulmonary disease, unspecified: Secondary | ICD-10-CM | POA: Diagnosis not present

## 2017-06-12 DIAGNOSIS — Z7189 Other specified counseling: Secondary | ICD-10-CM

## 2017-06-12 DIAGNOSIS — Z903 Acquired absence of stomach [part of]: Secondary | ICD-10-CM

## 2017-06-12 DIAGNOSIS — E1122 Type 2 diabetes mellitus with diabetic chronic kidney disease: Secondary | ICD-10-CM | POA: Diagnosis present

## 2017-06-12 DIAGNOSIS — Z90710 Acquired absence of both cervix and uterus: Secondary | ICD-10-CM

## 2017-06-12 DIAGNOSIS — R0602 Shortness of breath: Secondary | ICD-10-CM | POA: Diagnosis not present

## 2017-06-12 DIAGNOSIS — Z887 Allergy status to serum and vaccine status: Secondary | ICD-10-CM

## 2017-06-12 DIAGNOSIS — K219 Gastro-esophageal reflux disease without esophagitis: Secondary | ICD-10-CM | POA: Diagnosis present

## 2017-06-12 DIAGNOSIS — J849 Interstitial pulmonary disease, unspecified: Secondary | ICD-10-CM

## 2017-06-12 DIAGNOSIS — E43 Unspecified severe protein-calorie malnutrition: Secondary | ICD-10-CM | POA: Diagnosis present

## 2017-06-12 DIAGNOSIS — F411 Generalized anxiety disorder: Secondary | ICD-10-CM | POA: Diagnosis not present

## 2017-06-12 DIAGNOSIS — J441 Chronic obstructive pulmonary disease with (acute) exacerbation: Secondary | ICD-10-CM | POA: Diagnosis present

## 2017-06-12 DIAGNOSIS — A159 Respiratory tuberculosis unspecified: Secondary | ICD-10-CM | POA: Diagnosis not present

## 2017-06-12 DIAGNOSIS — K3184 Gastroparesis: Secondary | ICD-10-CM | POA: Diagnosis present

## 2017-06-12 DIAGNOSIS — N179 Acute kidney failure, unspecified: Secondary | ICD-10-CM | POA: Diagnosis present

## 2017-06-12 DIAGNOSIS — I509 Heart failure, unspecified: Secondary | ICD-10-CM | POA: Diagnosis not present

## 2017-06-12 DIAGNOSIS — Z681 Body mass index (BMI) 19 or less, adult: Secondary | ICD-10-CM

## 2017-06-12 DIAGNOSIS — R1310 Dysphagia, unspecified: Secondary | ICD-10-CM | POA: Diagnosis present

## 2017-06-12 DIAGNOSIS — J9611 Chronic respiratory failure with hypoxia: Secondary | ICD-10-CM | POA: Diagnosis not present

## 2017-06-12 DIAGNOSIS — E1143 Type 2 diabetes mellitus with diabetic autonomic (poly)neuropathy: Secondary | ICD-10-CM | POA: Diagnosis present

## 2017-06-12 DIAGNOSIS — R627 Adult failure to thrive: Secondary | ICD-10-CM | POA: Diagnosis present

## 2017-06-12 DIAGNOSIS — K588 Other irritable bowel syndrome: Secondary | ICD-10-CM | POA: Diagnosis not present

## 2017-06-12 DIAGNOSIS — Z515 Encounter for palliative care: Secondary | ICD-10-CM | POA: Diagnosis not present

## 2017-06-12 DIAGNOSIS — M810 Age-related osteoporosis without current pathological fracture: Secondary | ICD-10-CM | POA: Diagnosis present

## 2017-06-12 DIAGNOSIS — R000 Tachycardia, unspecified: Secondary | ICD-10-CM | POA: Diagnosis present

## 2017-06-12 DIAGNOSIS — N183 Chronic kidney disease, stage 3 (moderate): Secondary | ICD-10-CM | POA: Diagnosis present

## 2017-06-12 DIAGNOSIS — N189 Chronic kidney disease, unspecified: Secondary | ICD-10-CM | POA: Diagnosis not present

## 2017-06-12 DIAGNOSIS — Z933 Colostomy status: Secondary | ICD-10-CM

## 2017-06-12 DIAGNOSIS — Z66 Do not resuscitate: Secondary | ICD-10-CM | POA: Diagnosis not present

## 2017-06-12 DIAGNOSIS — Z79890 Hormone replacement therapy (postmenopausal): Secondary | ICD-10-CM

## 2017-06-12 DIAGNOSIS — Z932 Ileostomy status: Secondary | ICD-10-CM

## 2017-06-12 DIAGNOSIS — F0390 Unspecified dementia without behavioral disturbance: Secondary | ICD-10-CM | POA: Diagnosis present

## 2017-06-12 DIAGNOSIS — I4892 Unspecified atrial flutter: Secondary | ICD-10-CM | POA: Diagnosis present

## 2017-06-12 DIAGNOSIS — I110 Hypertensive heart disease with heart failure: Secondary | ICD-10-CM | POA: Diagnosis not present

## 2017-06-12 DIAGNOSIS — R001 Bradycardia, unspecified: Secondary | ICD-10-CM | POA: Diagnosis not present

## 2017-06-12 DIAGNOSIS — Z7901 Long term (current) use of anticoagulants: Secondary | ICD-10-CM

## 2017-06-12 DIAGNOSIS — Z79899 Other long term (current) drug therapy: Secondary | ICD-10-CM

## 2017-06-12 DIAGNOSIS — I481 Persistent atrial fibrillation: Secondary | ICD-10-CM | POA: Diagnosis present

## 2017-06-12 DIAGNOSIS — R0609 Other forms of dyspnea: Secondary | ICD-10-CM

## 2017-06-12 DIAGNOSIS — I272 Pulmonary hypertension, unspecified: Secondary | ICD-10-CM | POA: Diagnosis present

## 2017-06-12 DIAGNOSIS — Z886 Allergy status to analgesic agent status: Secondary | ICD-10-CM

## 2017-06-12 DIAGNOSIS — I4439 Other atrioventricular block: Secondary | ICD-10-CM | POA: Diagnosis present

## 2017-06-12 DIAGNOSIS — R0682 Tachypnea, not elsewhere classified: Secondary | ICD-10-CM | POA: Diagnosis not present

## 2017-06-12 DIAGNOSIS — Z825 Family history of asthma and other chronic lower respiratory diseases: Secondary | ICD-10-CM

## 2017-06-12 DIAGNOSIS — Z8249 Family history of ischemic heart disease and other diseases of the circulatory system: Secondary | ICD-10-CM

## 2017-06-12 DIAGNOSIS — I479 Paroxysmal tachycardia, unspecified: Secondary | ICD-10-CM | POA: Diagnosis not present

## 2017-06-12 DIAGNOSIS — Z9981 Dependence on supplemental oxygen: Secondary | ICD-10-CM | POA: Diagnosis not present

## 2017-06-12 DIAGNOSIS — G8929 Other chronic pain: Secondary | ICD-10-CM | POA: Diagnosis present

## 2017-06-12 DIAGNOSIS — Z888 Allergy status to other drugs, medicaments and biological substances status: Secondary | ICD-10-CM

## 2017-06-12 DIAGNOSIS — Z8611 Personal history of tuberculosis: Secondary | ICD-10-CM

## 2017-06-12 DIAGNOSIS — K589 Irritable bowel syndrome without diarrhea: Secondary | ICD-10-CM | POA: Diagnosis present

## 2017-06-12 DIAGNOSIS — Z881 Allergy status to other antibiotic agents status: Secondary | ICD-10-CM

## 2017-06-12 DIAGNOSIS — K861 Other chronic pancreatitis: Secondary | ICD-10-CM | POA: Diagnosis present

## 2017-06-12 DIAGNOSIS — E039 Hypothyroidism, unspecified: Secondary | ICD-10-CM | POA: Diagnosis present

## 2017-06-12 DIAGNOSIS — Z833 Family history of diabetes mellitus: Secondary | ICD-10-CM

## 2017-06-12 HISTORY — DX: Unspecified atrial fibrillation: I48.91

## 2017-06-12 HISTORY — DX: Sick sinus syndrome: I49.5

## 2017-06-12 LAB — COMPREHENSIVE METABOLIC PANEL
ALBUMIN: 3.3 g/dL — AB (ref 3.5–5.0)
ALT: 10 U/L — ABNORMAL LOW (ref 14–54)
ANION GAP: 8 (ref 5–15)
AST: 21 U/L (ref 15–41)
Alkaline Phosphatase: 120 U/L (ref 38–126)
BILIRUBIN TOTAL: 0.6 mg/dL (ref 0.3–1.2)
BUN: 18 mg/dL (ref 6–20)
CHLORIDE: 102 mmol/L (ref 101–111)
CO2: 26 mmol/L (ref 22–32)
Calcium: 8.4 mg/dL — ABNORMAL LOW (ref 8.9–10.3)
Creatinine, Ser: 1.33 mg/dL — ABNORMAL HIGH (ref 0.44–1.00)
GFR calc Af Amer: 41 mL/min — ABNORMAL LOW (ref >60)
GFR, EST NON AFRICAN AMERICAN: 35 mL/min — AB (ref >60)
Glucose, Bld: 218 mg/dL — ABNORMAL HIGH (ref 65–99)
POTASSIUM: 4.1 mmol/L (ref 3.5–5.1)
Sodium: 136 mmol/L (ref 135–145)
TOTAL PROTEIN: 7.4 g/dL (ref 6.5–8.1)

## 2017-06-12 LAB — CBC WITH DIFFERENTIAL/PLATELET
BASOS ABS: 0 10*3/uL (ref 0.0–0.1)
BASOS PCT: 0 %
Eosinophils Absolute: 0 10*3/uL (ref 0.0–0.7)
Eosinophils Relative: 0 %
HCT: 38.9 % (ref 36.0–46.0)
HEMOGLOBIN: 12.2 g/dL (ref 12.0–15.0)
Lymphocytes Relative: 12 %
Lymphs Abs: 1.9 10*3/uL (ref 0.7–4.0)
MCH: 31 pg (ref 26.0–34.0)
MCHC: 31.4 g/dL (ref 30.0–36.0)
MCV: 99 fL (ref 78.0–100.0)
Monocytes Absolute: 1.4 10*3/uL — ABNORMAL HIGH (ref 0.1–1.0)
Monocytes Relative: 9 %
NEUTROS ABS: 12.1 10*3/uL — AB (ref 1.7–7.7)
NEUTROS PCT: 79 %
PLATELETS: 369 10*3/uL (ref 150–400)
RBC: 3.93 MIL/uL (ref 3.87–5.11)
RDW: 14.7 % (ref 11.5–15.5)
WBC: 15.4 10*3/uL — AB (ref 4.0–10.5)

## 2017-06-12 LAB — TROPONIN I: Troponin I: <0.03 ng/mL (ref <0.03)

## 2017-06-12 LAB — MAGNESIUM: MAGNESIUM: 1.3 mg/dL — AB (ref 1.7–2.4)

## 2017-06-12 LAB — BRAIN NATRIURETIC PEPTIDE: B NATRIURETIC PEPTIDE 5: 1758 pg/mL — AB (ref 0.0–100.0)

## 2017-06-12 LAB — PHOSPHORUS: PHOSPHORUS: 3 mg/dL (ref 2.5–4.6)

## 2017-06-12 LAB — TSH: TSH: 1.955 u[IU]/mL (ref 0.350–4.500)

## 2017-06-12 MED ORDER — DILTIAZEM HCL-DEXTROSE 100-5 MG/100ML-% IV SOLN (PREMIX)
5.0000 mg/h | INTRAVENOUS | Status: DC
  Administered 2017-06-12: 5 mg/h via INTRAVENOUS
  Administered 2017-06-12 – 2017-06-13 (×5): 15 mg/h via INTRAVENOUS
  Administered 2017-06-14: 10 mg/h via INTRAVENOUS
  Filled 2017-06-12 (×8): qty 100

## 2017-06-12 MED ORDER — ACETAMINOPHEN 325 MG PO TABS
650.0000 mg | ORAL_TABLET | Freq: Four times a day (QID) | ORAL | Status: DC | PRN
  Filled 2017-06-12 (×7): qty 2

## 2017-06-12 MED ORDER — METOPROLOL TARTRATE 12.5 MG HALF TABLET
12.5000 mg | ORAL_TABLET | Freq: Two times a day (BID) | ORAL | Status: DC
  Administered 2017-06-12 – 2017-06-15 (×6): 12.5 mg via ORAL
  Filled 2017-06-12 (×6): qty 1

## 2017-06-12 MED ORDER — VENLAFAXINE HCL ER 37.5 MG PO CP24
37.5000 mg | ORAL_CAPSULE | Freq: Every day | ORAL | Status: DC
  Administered 2017-06-13 – 2017-06-20 (×8): 37.5 mg via ORAL
  Filled 2017-06-12 (×11): qty 1

## 2017-06-12 MED ORDER — RIVAROXABAN 15 MG PO TABS
15.0000 mg | ORAL_TABLET | Freq: Every day | ORAL | Status: DC
  Administered 2017-06-12 – 2017-06-19 (×8): 15 mg via ORAL
  Filled 2017-06-12 (×8): qty 1

## 2017-06-12 MED ORDER — FUROSEMIDE 10 MG/ML IJ SOLN
40.0000 mg | Freq: Once | INTRAMUSCULAR | Status: AC
  Administered 2017-06-12: 40 mg via INTRAVENOUS
  Filled 2017-06-12: qty 4

## 2017-06-12 MED ORDER — LEVALBUTEROL HCL 0.63 MG/3ML IN NEBU
0.6300 mg | INHALATION_SOLUTION | Freq: Once | RESPIRATORY_TRACT | Status: AC
  Administered 2017-06-12: 0.63 mg via RESPIRATORY_TRACT
  Filled 2017-06-12: qty 3

## 2017-06-12 MED ORDER — ACETAMINOPHEN 325 MG PO TABS
650.0000 mg | ORAL_TABLET | Freq: Four times a day (QID) | ORAL | Status: DC | PRN
  Administered 2017-06-12 – 2017-06-20 (×8): 650 mg via ORAL
  Filled 2017-06-12: qty 2

## 2017-06-12 MED ORDER — LEVOTHYROXINE SODIUM 25 MCG PO TABS
25.0000 ug | ORAL_TABLET | Freq: Every day | ORAL | Status: DC
  Administered 2017-06-13 – 2017-06-20 (×8): 25 ug via ORAL
  Filled 2017-06-12 (×9): qty 1

## 2017-06-12 MED ORDER — ALBUTEROL SULFATE (5 MG/ML) 0.5% IN NEBU: 2.5000 mg | INHALATION_SOLUTION | Freq: Once | RESPIRATORY_TRACT | Status: DC

## 2017-06-12 MED ORDER — ONDANSETRON HCL 4 MG/2ML IJ SOLN
4.0000 mg | Freq: Four times a day (QID) | INTRAMUSCULAR | Status: DC | PRN
  Administered 2017-06-13: 4 mg via INTRAVENOUS
  Filled 2017-06-12: qty 2

## 2017-06-12 MED ORDER — ORAL CARE MOUTH RINSE
15.0000 mL | Freq: Two times a day (BID) | OROMUCOSAL | Status: DC
  Administered 2017-06-12 – 2017-06-20 (×14): 15 mL via OROMUCOSAL

## 2017-06-12 MED ORDER — METOCLOPRAMIDE HCL 5 MG PO TABS
5.0000 mg | ORAL_TABLET | Freq: Three times a day (TID) | ORAL | Status: DC
  Administered 2017-06-12 – 2017-06-20 (×32): 5 mg via ORAL
  Filled 2017-06-12 (×32): qty 1

## 2017-06-12 MED ORDER — SODIUM CHLORIDE 0.9 % IV SOLN: 250.0000 mL | INTRAVENOUS | Status: DC | PRN

## 2017-06-12 MED ORDER — ACETAMINOPHEN 650 MG RE SUPP: 650.0000 mg | Freq: Four times a day (QID) | RECTAL | Status: DC | PRN

## 2017-06-12 MED ORDER — ONDANSETRON HCL 4 MG PO TABS: 4.0000 mg | ORAL_TABLET | Freq: Four times a day (QID) | ORAL | Status: DC | PRN

## 2017-06-12 MED ORDER — DILTIAZEM LOAD VIA INFUSION
5.0000 mg | Freq: Once | INTRAVENOUS | Status: AC
  Administered 2017-06-12: 5 mg via INTRAVENOUS
  Filled 2017-06-12: qty 5

## 2017-06-12 MED ORDER — MEMANTINE HCL 5 MG PO TABS
10.0000 mg | ORAL_TABLET | Freq: Two times a day (BID) | ORAL | Status: DC
  Administered 2017-06-12 – 2017-06-18 (×12): 10 mg via ORAL
  Filled 2017-06-12 (×6): qty 2
  Filled 2017-06-12: qty 1
  Filled 2017-06-12: qty 2
  Filled 2017-06-12: qty 1
  Filled 2017-06-12 (×4): qty 2

## 2017-06-12 MED ORDER — CITALOPRAM HYDROBROMIDE 10 MG PO TABS
10.0000 mg | ORAL_TABLET | Freq: Every day | ORAL | Status: DC
  Administered 2017-06-12 – 2017-06-20 (×9): 10 mg via ORAL
  Filled 2017-06-12 (×9): qty 1

## 2017-06-12 MED ORDER — SODIUM CHLORIDE 0.9% FLUSH
3.0000 mL | Freq: Two times a day (BID) | INTRAVENOUS | Status: DC
  Administered 2017-06-12 – 2017-06-19 (×11): 3 mL via INTRAVENOUS

## 2017-06-12 MED ORDER — ACETAMINOPHEN 325 MG PO TABS
650.0000 mg | ORAL_TABLET | ORAL | Status: DC | PRN
  Administered 2017-06-12: 650 mg via ORAL
  Filled 2017-06-12: qty 2

## 2017-06-12 MED ORDER — METHYLPREDNISOLONE SODIUM SUCC 125 MG IJ SOLR
80.0000 mg | Freq: Once | INTRAMUSCULAR | Status: AC
  Administered 2017-06-12: 80 mg via INTRAVENOUS
  Filled 2017-06-12: qty 2

## 2017-06-12 MED ORDER — ALPRAZOLAM 0.5 MG PO TABS
0.5000 mg | ORAL_TABLET | Freq: Once | ORAL | Status: AC
  Administered 2017-06-12: 0.5 mg via ORAL
  Filled 2017-06-12: qty 1

## 2017-06-12 MED ORDER — SODIUM CHLORIDE 0.9% FLUSH
3.0000 mL | INTRAVENOUS | Status: DC | PRN
  Administered 2017-06-18: 3 mL via INTRAVENOUS
  Filled 2017-06-12: qty 3

## 2017-06-12 MED ORDER — SENNOSIDES-DOCUSATE SODIUM 8.6-50 MG PO TABS: 1.0000 | ORAL_TABLET | Freq: Every evening | ORAL | Status: DC | PRN

## 2017-06-12 MED ORDER — PANCRELIPASE (LIP-PROT-AMYL) 12000-38000 UNITS PO CPEP
12000.0000 [IU] | ORAL_CAPSULE | Freq: Three times a day (TID) | ORAL | Status: DC
  Administered 2017-06-12 – 2017-06-20 (×24): 12000 [IU] via ORAL
  Filled 2017-06-12 (×24): qty 1

## 2017-06-12 NOTE — ED Provider Notes (Signed)
[facility] EMERGENCY DEPARTMENT Provider Note   CSN: [id number] Arrival date & time: 06/12/17  0227  Time seen 3:05 AM   History   Chief Complaint Chief Complaint  Patient presents with  . Shortness of Breath   Level 5 caveat for age  HPI [name] is a 83 y.o. female.  HPI patient is here with her caregiver who took care of her afternoon.  She states the patient seemed fine although she has been noticing that the patient was discharged from the hospital 7 days ago when she had an irregular heartbeat.  She states when the patient exerts herself such as walking to the bathroom her heart rate will jump up to 140.  Patient states today when she takes a big deep breath she did get some chest pain and she has had a mild cough of clear mucus without fever.  She has had some mild nausea without vomiting.  She denies any abdominal pain.  She denies any abdominal bloating.  At 2 AM she felt very short of breath and felt dizzy.  EMS was called and her pulse ox was 90% on her usual 2 L/min nasal cannula.  They increased her oxygen to 3 L and her pulse ox improved.  PCP Kathyrn Drown, MD Cardiology Dr Harl Bowie  Past Medical History:  Diagnosis Date  . Anemia   . 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 Chronic abdominal pain   . Chronic neck and back pain   . COPD (chronic obstructive pulmonary disease) ([place])   . GAD (generalized anxiety disorder)   . Gastroesophageal reflux disease   . Gastroparesis   . IBS (irritable bowel syndrome)   . Lung mass   . Migraines   . Osteoporosis   . 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 Renal insufficiency   . S/P colostomy ([place])   . Tachycardia     Patient Active Problem List   Diagnosis Date Noted  . Tachycardia 06/12/2017  . Chronic respiratory failure with hypoxia ([place]) 05/27/2017  . Interstitial lung disease ([place]) 05/27/2017  . Atrial fibrillation ([place]) 05/26/2017  . Aortic atherosclerosis ([place]) 04/30/2017  . Depression with anxiety 06/10/2016  .  Pubic ramus fracture, left, closed, initial encounter ([place]) 06/10/2016  . Pubic ramus fracture, right, closed, initial encounter ([place]) 06/10/2016  . COPD (chronic obstructive pulmonary disease) ([place]) 06/10/2016  . Hypertension 06/10/2016  . Diabetes mellitus ([place]) 06/10/2016  . Hyponatremia 06/10/2016  . Leukocytosis 06/10/2016  . Fall at home 06/10/2016  . Pelvic fracture ([place]) 06/10/2016  . Closed bilateral fracture of pubic rami ([place])   . Fall   . Cachexia ([place]) 04/23/2016  . Frailty 01/23/2016  . Abdominal pain 01/01/2016  . Acute respiratory failure with hypoxia ([place]) 12/20/2015  . Dyspnea 07/25/2015  . Adult failure to thrive 05/30/2015  . 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 Hemoptysis 01/15/2015  . Pernicious anemia 09/13/2014  . Prediabetes 12/17/2013  . Hypothyroidism 10/04/2013  . Muscle cramps 05/03/2013  . Lung mass 03/17/2013  . Chronic headaches 11/11/2012  . Weight loss 09/21/2012  . Diarrhea 05/11/2012  . Gastroparesis 11/26/2011  . Flatulence 11/26/2011  . Osteoporosis 11/26/2011  . Ileostomy in place [facility]) 11/26/2011  . SCIATICA 08/24/2007    Past Surgical History:  Procedure Laterality Date  . ABDOMINAL HYSTERECTOMY    . APPENDECTOMY    . CHOLECYSTECTOMY    . COLOSTOMY    . ESOPHAGOGASTRODUODENOSCOPY    . ESOPHAGOGASTRODUODENOSCOPY N/A 08/25/2015   Procedure: ESOPHAGOGASTRODUODENOSCOPY (EGD);  Surgeon: Bernadene Person  Gloriann Loan, MD;  Location: [facility] ENDO SUITE;  Service: Endoscopy;  Laterality: N/A;  730  . ESOPHAGOGASTRODUODENOSCOPY N/A 06/03/2017   Procedure: ESOPHAGOGASTRODUODENOSCOPY (EGD);  Surgeon: Rogene Houston, MD;  Location: [facility] ENDO SUITE;  Service: Endoscopy;  Laterality: N/A;  . FOREIGN BODY REMOVAL  06/03/2017   Procedure: FOREIGN BODY REMOVAL;  Surgeon: Rogene Houston, MD;  Location: [facility] ENDO SUITE;  Service: Endoscopy;;  . ILEOSCOPY N/A 08/25/2015   Procedure: ILEOSCOPY THROUGH STOMA;  Surgeon: Rogene Houston, MD;  Location: [facility] ENDO SUITE;   Service: Endoscopy;  Laterality: N/A;  . PARTIAL GASTRECTOMY  1967   3/4 gastric resection for bleeding ulcers  . TONSILLECTOMY      OB History    Gravida Para Term Preterm AB Living             3   SAB TAB Ectopic Multiple Live Births                   Home Medications    Prior to Admission medications   Medication Sig Start Date End Date Taking? Authorizing Provider  acetaminophen (TYLENOL) 325 MG tablet Take 2 tablets (650 mg total) by mouth every 6 (six) hours as needed for mild pain or headache (or Fever >/= 101). 12/22/15   Samuella Cota, MD  chlorpheniramine-HYDROcodone [facility] PENNKINETIC ER) 10-8 MG/5ML SUER Take 5 mLs by mouth every 12 (twelve) hours as needed for cough. Patient not taking: Reported on 05/26/2017 03/06/17   Kathyrn Drown, MD  citalopram (CELEXA) 10 MG tablet TAKE (1) TABLET BY MOUTH ONCE DAILY. 01/20/17   Kathyrn Drown, MD  diazepam (VALIUM) 2 MG tablet Take 1 tablets twice a daily as needed. Take sparingly (Prescription must last 30 days) 03/06/17   Kathyrn Drown, MD  diltiazem (CARDIZEM CD) 360 MG 24 hr capsule Take 1 capsule (360 mg total) by mouth daily. 06/05/17   Isaac Bliss, Rayford Halsted, MD  Doxylamine-DM (ROBITUSSIN NIGHTTIME COUGH DM) 12.5-30 MG/10ML LIQD Take 5 mLs by mouth daily as needed (for cough). Patient taked 1 teaspoon as needed.     [provider]  ipratropium (ATROVENT HFA) 17 MCG/ACT inhaler 2 PUFFS FOUR TIMES DAILY. Patient taking differently: Inhale 2 puffs into the lungs 4 (four) times daily.  09/20/16   Kathyrn Drown, MD  ipratropium (ATROVENT) 0.03 % nasal spray Place 2 sprays into both nostrils every 12 (twelve) hours. 04/29/17   Kathyrn Drown, MD  levothyroxine (SYNTHROID, LEVOTHROID) 25 MCG tablet TAKE 2 TABLETS BY MOUTH ON MONDAY AND FRIDAY. & 1 TABLET ALL OTHER DAYS 05/08/17   Kathyrn Drown, MD  lipase/protease/amylase (CREON) 12000 units CPEP capsule TAKE 2 CAPSULES BY MOUTH 3 TIMES DAILY WITH MEALS. TAKE  1 CAPSULE TWICE DAILY WITH SNACKS Patient not taking: Reported on 05/26/2017 04/23/17   Rogene Houston, MD  loperamide (IMODIUM) 2 MG capsule TAKE (1) CAPSULE THREE TIMES DAILY BEFORE MEALS. 02/07/17   Rehman, Mechele Dawley, MD  Melatonin 5 MG CAPS Take 5 mg by mouth at bedtime.    [provider]  memantine (NAMENDA) 10 MG tablet TAKE 1 TABLET BY MOUTH TWICE DAILY. 02/20/17   Kathyrn Drown, MD  metoCLOPramide (REGLAN) 5 MG tablet Take 1 tablet (5 mg total) by mouth 4 (four) times daily -  before meals and at bedtime. 06/04/17 06/04/18  Isaac Bliss, Rayford Halsted, MD  metoprolol tartrate (LOPRESSOR) 25 MG tablet TAKE 1/2 TABLET TWICE DAILY FOR HIGH BLOOD PRESSURE. 01/20/17  Kathyrn Drown, MD  mupirocin ointment (BACTROBAN) 2 % Apply to affected area 2 times daily 05/15/17 05/15/18  Kathyrn Drown, MD  OXYGEN Inhale into the lungs. 2 liters -Nasal [place]    [provider]  potassium chloride (K-DUR) 10 MEQ tablet TAKE (1) TABLET BY MOUTH ONCE DAILY. 05/08/17   Kathyrn Drown, MD  Rivaroxaban (XARELTO) 15 MG TABS tablet Take 1 tablet (15 mg total) by mouth daily with supper. 06/04/17   Isaac Bliss, Rayford Halsted, MD  Simethicone (GAS RELIEF PO) Take 1 tablet by mouth daily as needed (for gas relief).     [provider]  venlafaxine XR (EFFEXOR-XR) 37.5 MG 24 hr capsule TAKE 1 CAPSULE BY MOUTH ONCE DAILY WITH BREAKFAST 05/08/17   Kathyrn Drown, MD    Family History Family History  Problem Relation Age of Onset  . Stroke Mother   . Stroke Father   . Diabetes Sister   . Diabetes Brother   . Stroke Brother   . Cancer Sister        unknown type  . Diabetes Sister   . Emphysema Sister   . Diabetes Brother   . Stroke Brother   . Diabetes Son   . Irritable bowel syndrome Son   . Diabetes Son   . Hypertension Son     Social History Social History   Tobacco Use  . Smoking status: Never Smoker  . Smokeless tobacco: Never Used  Substance Use Topics  . Alcohol  use: No    Alcohol/week: 0.0 oz  . Drug use: No  lives at home Has in home caregivers On oxygen 2 lpm [place]   Allergies   Levaquin [levofloxacin]; Aspirin; Tetanus toxoid; Zolpidem tartrate; and Fosamax [alendronate sodium]   Review of Systems Review of Systems  Unable to perform ROS: Age     Physical Exam Updated Vital Signs BP (!) 139/103   Pulse (!) 140   Temp 98 F (36.7 C) (Oral)   Resp 20   Ht 4\' 11"  (1.499 m)   Wt 37.6 kg (83 lb)   SpO2 90%   BMI 16.76 kg/m   Vital signs normal except for hypertension and tachycardia   Physical Exam  Constitutional: She is oriented to person, place, and time.  Non-toxic appearance. She does not appear ill. No distress.  Frail elderly female  HENT:  Head: Normocephalic and atraumatic.  Right Ear: External ear normal.  Left Ear: External ear normal.  Nose: Nose normal. No mucosal edema or rhinorrhea.  Mouth/Throat: Oropharynx is clear and moist and mucous membranes are normal. No dental abscesses or uvula swelling.  Eyes: Conjunctivae and EOM are normal. Pupils are equal, round, and reactive to light.  Neck: Normal range of motion and full passive range of motion without pain. Neck supple.  Cardiovascular: Normal heart sounds. An irregularly irregular rhythm present. Tachycardia present. Exam reveals no gallop and no friction rub.  No murmur heard. Pulmonary/Chest: Effort normal. No accessory muscle usage. No tachypnea. No respiratory distress. She has wheezes. She has no rhonchi. She has no rales. She exhibits no tenderness and no crepitus.  Abdominal: Soft. Normal appearance and bowel sounds are normal. She exhibits no distension. There is no tenderness. There is no rebound and no guarding.  Colostomy bag in the right abdomen  Musculoskeletal: Normal range of motion. She exhibits no edema or tenderness.  Moves all extremities well.   Neurological: She is alert and oriented to person, place, and time. She has  normal strength. No  cranial nerve deficit.  Skin: Skin is warm, dry and intact. No rash noted. No erythema. No pallor.  Psychiatric: She has a normal mood and affect. Her speech is normal and behavior is normal. Her mood appears not anxious.  Nursing note and vitals reviewed.    ED Treatments / Results  Labs (all labs ordered are listed, but only abnormal results are displayed) Results for orders placed or performed during the hospital encounter of 06/12/17  Comprehensive metabolic panel  Result Value Ref Range   Sodium 136 135 - 145 mmol/L   Potassium 4.1 3.5 - 5.1 mmol/L   Chloride 102 101 - 111 mmol/L   CO2 26 22 - 32 mmol/L   Glucose, Bld 218 (H) 65 - 99 mg/dL   BUN 18 6 - 20 mg/dL   Creatinine, Ser 1.33 (H) 0.44 - 1.00 mg/dL   Calcium 8.4 (L) 8.9 - 10.3 mg/dL   Total Protein 7.4 6.5 - 8.1 g/dL   Albumin 3.3 (L) 3.5 - 5.0 g/dL   AST 21 15 - 41 U/L   ALT 10 (L) 14 - 54 U/L   Alkaline Phosphatase 120 38 - 126 U/L   Total Bilirubin 0.6 0.3 - 1.2 mg/dL   GFR calc non Af Amer 35 (L) >60 mL/min   GFR calc Af Amer 41 (L) >60 mL/min   Anion gap 8 5 - 15  Troponin I  Result Value Ref Range   Troponin I <0.03 <0.03 ng/mL  CBC with Differential  Result Value Ref Range   WBC 15.4 (H) 4.0 - 10.5 K/uL   RBC 3.93 3.87 - 5.11 MIL/uL   Hemoglobin 12.2 12.0 - 15.0 g/dL   HCT 38.9 36.0 - 46.0 %   MCV 99.0 78.0 - 100.0 fL   MCH 31.0 26.0 - 34.0 pg   MCHC 31.4 30.0 - 36.0 g/dL   RDW 14.7 11.5 - 15.5 %   Platelets 369 150 - 400 K/uL   Neutrophils Relative % 79 %   Neutro Abs 12.1 (H) 1.7 - 7.7 K/uL   Lymphocytes Relative 12 %   Lymphs Abs 1.9 0.7 - 4.0 K/uL   Monocytes Relative 9 %   Monocytes Absolute 1.4 (H) 0.1 - 1.0 K/uL   Eosinophils Relative 0 %   Eosinophils Absolute 0.0 0.0 - 0.7 K/uL   Basophils Relative 0 %   Basophils Absolute 0.0 0.0 - 0.1 K/uL  Brain natriuretic peptide  Result Value Ref Range   B Natriuretic Peptide 1,758.0 (H) 0.0 - 100.0 pg/mL   Laboratory interpretation all  normal except hyperglycemia, renal insufficiency, leukocytosis, persistently elevated BNP    EKG  EKG Interpretation  Date/Time:  Thursday June 12 2017 02:36:31 EST Ventricular Rate:  97 PR Interval:    QRS Duration: 115 QT Interval:  368 QTC Calculation: 468 R Axis:   117 Text Interpretation:  Atrial flutter Ventricular premature complex Incomplete right bundle branch block Probable lateral infarct, age indeterminate Probable anteroseptal infarct, recent Since last tracing rate slower Confirmed by Rolland Porter [id number]) on 06/12/2017 2:56:07 AM       Radiology Dg Chest Port 1 View  Result Date: 06/12/2017 CLINICAL DATA:  Shortness of breath for 3 days, dizziness and weakness. Wheezing. EXAM: PORTABLE CHEST 1 VIEW COMPARISON:  Chest radiograph May 26, 2017. CT chest April 30, 2017 FINDINGS: Cardiac silhouette is mildly enlarged and unchanged. Calcified aortic knob. Increased lung volumes with diffusely coarsened interstitium, small bilateral pleural effusions RIGHT upper lobe cavitation  and scarring with additional small cavitary nodules better seen on prior CT. Scattered parenchymal calcifications. No pneumothorax. Widened at LEFT acromioclavicular joint space may be postoperative. Surgical clips projected GE junction. IMPRESSION: Increasing small pleural effusions. Pleuroparenchymal scarring better characterized April 30, 2017, superimposed pneumonia possible. Mild cardiomegaly. Aortic Atherosclerosis (ICD10-I70.0). Electronically Signed   By: Elon Alas M.D.   On: 06/12/2017 03:37    Procedures .Critical Care Performed by: Rolland Porter, MD Authorized by: Rolland Porter, MD   Critical care provider statement:    Critical care time (minutes):  33   Critical care was necessary to treat or prevent imminent or life-threatening deterioration of the following conditions:  Cardiac failure and respiratory failure   Critical care was time spent personally by me on the following  activities:  Discussions with consultants, evaluation of patient's response to treatment, examination of patient, obtaining history from patient or surrogate, ordering and review of laboratory studies, ordering and review of radiographic studies, pulse oximetry, re-evaluation of patient's condition and review of old charts   (including critical care time)  Medications Ordered in ED Medications  diltiazem (CARDIZEM) 1 mg/mL load via infusion 5 mg (5 mg Intravenous Bolus from Bag 06/12/17 0336)    And  diltiazem (CARDIZEM) 100 mg in dextrose 5% 123mL (1 mg/mL) infusion (10 mg/hr Intravenous Rate/Dose Change 06/12/17 0403)  methylPREDNISolone sodium succinate (SOLU-MEDROL) 125 mg/2 mL injection 80 mg (80 mg Intravenous Given 06/12/17 0336)  levalbuterol (XOPENEX) nebulizer solution 0.63 mg (0.63 mg Nebulization Given 06/12/17 0404)  furosemide (LASIX) injection 40 mg (40 mg Intravenous Given 06/12/17 0533)     Initial Impression / Assessment and Plan / ED Course  I have reviewed the triage vital signs and the nursing notes.  Pertinent labs & imaging results that were available during my care of the patient were reviewed by me and considered in my medical decision making (see chart for details).      Patient was given Xopenex nebulizer for her wheezing, she was not given albuterol due to her already having tachycardia.  She was started on a Cardizem bolus and drip.  Due to her small frame she was only given 5 mg bolus.   I reviewed patient's past admission, she was admitted from November 12 - November 21.  She had atrial fibrillation with fast ventricular response.  She also had a lung mass.  She was started on Cardizem IV bolus and drip to control her atrial fibrillation with fast ventricular response.  She was transitioned to oral metoprolol and diltiazem however she started having uncontrolled heart rate again and was placed back on the Cardizem infusion.  She was noted to have some sinus  pauses up to 5 seconds, cardiology was reconsulted and they wanted her to continue on the medicine she had been on but they decreased the dose of her beta blocker.  At time of discharge her pauses were under 2 seconds.  She also was started on Xarelto.  Patient was noted to have fibrotic changes and cavitary lesions on her chest x-ray for the past year.  She has already been on oxygen.  Recheck at 3:20 AM patient's heart rate still goes up to 140.  We discussed her x-ray results, patient was given IV Lasix.  BNP was added to her blood work.  I am going to talk to the hospitalist about admitting her.  05:59 AM Dr Aggie Moats, hospitalist, will admit  Final Clinical Impressions(s) / ED Diagnoses   Final diagnoses:  COPD exacerbation ([facility])  Atrial fibrillation with rapid ventricular response ([facility])  Dyspnea on exertion  Acute on chronic congestive heart failure, unspecified heart failure type [facility])    Plan admission  Rolland Porter, MD, Barbette Or, MD 06/12/17 [phone]

## 2017-06-12 NOTE — H&P (Signed)
History and Physical    [name] WUX:[id number] DOB: 1929/05/11 DOA: 06/12/2017  Referring MD/NP/PA: Rolland Porter, EDP PCP: Kathyrn Drown, MD  Patient coming from: Home  Chief Complaint: Shortness of breath and weakness  HPI: [name] is a 83 y.o. female who was recently discharged due to tachybradycardia syndrome who presents today with the above symptoms.  During her prior hospitalization there was difficulty adjusting her rate medications due to long pauses when doses were increased, followed by rapid rates when doses were decreased. Cardiology was involved and decision was finally made to discharge her on metoprolol 12.5 mg BID and cardizem CD. She returns today with SOB and found to be in fib/flutter with rates in the 140s. Cardiology is consulted, and they believe patient should be transferred to [facility] to be evaluated by the EP service. 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 No active signs/symptoms of PNA. Patient will be admitted and transferred to [facility].  Past Medical/Surgical History: Past Medical History:  Diagnosis Date  . Anemia   . Atrial fibrillation ([place])   . 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 Chronic abdominal pain   . Chronic neck and back pain   . COPD (chronic obstructive pulmonary disease) ([place])   . GAD (generalized anxiety disorder)   . Gastroesophageal reflux disease   . Gastroparesis   . IBS (irritable bowel syndrome)   . Migraines   . Osteoporosis   . 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 Renal insufficiency   . S/P colostomy ([place])   . Tachycardia-bradycardia syndrome [facility])     Past Surgical History:  Procedure Laterality Date  . ABDOMINAL HYSTERECTOMY    . APPENDECTOMY    . CHOLECYSTECTOMY    . COLOSTOMY    . ESOPHAGOGASTRODUODENOSCOPY    . ESOPHAGOGASTRODUODENOSCOPY N/A 08/25/2015   Procedure: ESOPHAGOGASTRODUODENOSCOPY (EGD);  Surgeon: Rogene Houston, MD;  Location[facility];  Service: Endoscopy;   Laterality: N/A;  730  . ESOPHAGOGASTRODUODENOSCOPY N/A 06/03/2017   Procedure: ESOPHAGOGASTRODUODENOSCOPY (EGD);  Surgeon: Rogene Houston, MD;  Location: [facility];  Service: Endoscopy;  Laterality: N/A;  . FOREIGN BODY REMOVAL  06/03/2017   Procedure: FOREIGN BODY REMOVAL;  Surgeon: Rogene [place], MD;  Location: [facility];  Service: Endoscopy;;  . ILEOSCOPY N/A 08/25/2015   Procedure: ILEOSCOPY THROUGH STOMA;  Surgeon: Rogene Houston, MD;  Location: [facility];  Service: Endoscopy;  Laterality: N/A;  . PARTIAL GASTRECTOMY  1967   3/4 gastric resection for bleeding ulcers  . TONSILLECTOMY      Social History:  reports that  has never smoked. she has never used smokeless tobacco. She reports that she does not drink alcohol or use drugs.  Allergies: Allergies  Allergen Reactions  . Levaquin [Levofloxacin] Other (See Comments)    Patient states that she became weak and extremely tired. Also, it made her real dizzy.  . Aspirin Nausea And Vomiting    Nausea/vomiting blood  . Tetanus Toxoid Hives and Swelling  . Zolpidem Tartrate     Not in right state of mind  . Fosamax [Alendronate Sodium] Other (See Comments)    Severe gastritis reflux.    Family History:  Family History  Problem Relation Age of Onset  . Stroke Mother   . Stroke Father   . Diabetes Sister   . Diabetes Brother   . Stroke Brother   . Cancer Sister        unknown  type  . Diabetes Sister   . Emphysema Sister   . Diabetes Brother   . Stroke Brother   . Diabetes Son   . Irritable bowel syndrome Son   . Diabetes Son   . Hypertension Son     Prior to Admission medications   Medication Sig Start Date End Date Taking? Authorizing Provider  acetaminophen (TYLENOL) 325 MG tablet Take 2 tablets (650 mg total) by mouth every 6 (six) hours as needed for mild pain or headache (or Fever >/= 101). 12/22/15  Yes Samuella Cota, MD  citalopram (CELEXA) 10 MG tablet TAKE (1) TABLET BY MOUTH ONCE DAILY.  01/20/17  Yes Luking, Scott A, MD  diazepam (VALIUM) 2 MG tablet Take 1 tablets twice a daily as needed. Take sparingly (Prescription must last 30 days) 03/06/17  Yes Kathyrn Drown, MD  diltiazem (CARDIZEM CD) 360 MG 24 hr capsule Take 1 capsule (360 mg total) by mouth daily. 06/05/17  Yes Isaac Bliss, Rayford Halsted, MD  Doxylamine-DM (ROBITUSSIN NIGHTTIME COUGH DM) 12.5-30 MG/10ML LIQD Take 5 mLs by mouth daily as needed (for cough). Patient taked 1 teaspoon as needed.    Yes [provider]  ipratropium (ATROVENT HFA) 17 MCG/ACT inhaler 2 PUFFS FOUR TIMES DAILY. Patient taking differently: Inhale 2 puffs into the lungs 4 (four) times daily.  09/20/16  Yes Luking, Scott A, MD  ipratropium (ATROVENT) 0.03 % nasal spray Place 2 sprays into both nostrils every 12 (twelve) hours. 04/29/17  Yes Luking, Elayne Snare, MD  levothyroxine (SYNTHROID, LEVOTHROID) 25 MCG tablet TAKE 2 TABLETS BY MOUTH ON MONDAY AND FRIDAY. & 1 TABLET ALL OTHER DAYS 05/08/17  Yes Luking, Scott A, MD  lipase/protease/amylase (CREON) 12000 units CPEP capsule TAKE 2 CAPSULES BY MOUTH 3 TIMES DAILY WITH MEALS. TAKE 1 CAPSULE TWICE DAILY WITH SNACKS 04/23/17  Yes Rehman, Mechele Dawley, MD  loperamide (IMODIUM) 2 MG capsule TAKE (1) CAPSULE THREE TIMES DAILY BEFORE MEALS. 02/07/17  Yes Rehman, Mechele Dawley, MD  Melatonin 5 MG CAPS Take 5 mg by mouth at bedtime.   Yes [provider]  memantine (NAMENDA) 10 MG tablet TAKE 1 TABLET BY MOUTH TWICE DAILY. 02/20/17  Yes Luking, Elayne Snare, MD  metoCLOPramide (REGLAN) 5 MG tablet Take 1 tablet (5 mg total) by mouth 4 (four) times daily -  before meals and at bedtime. 06/04/17 06/04/18 Yes Erline Hau, MD  metoprolol tartrate (LOPRESSOR) 25 MG tablet TAKE 1/2 TABLET TWICE DAILY FOR HIGH BLOOD PRESSURE. 01/20/17  Yes Kathyrn Drown, MD  mupirocin ointment (BACTROBAN) 2 % Apply to affected area 2 times daily 05/15/17 05/15/18 Yes Luking, Elayne Snare, MD  potassium chloride (K-DUR) 10 MEQ  tablet TAKE (1) TABLET BY MOUTH ONCE DAILY. 05/08/17  Yes Kathyrn Drown, MD  Rivaroxaban (XARELTO) 15 MG TABS tablet Take 1 tablet (15 mg total) by mouth daily with supper. 06/04/17  Yes Isaac Bliss, Rayford Halsted, MD  Simethicone (GAS RELIEF PO) Take 1 tablet by mouth daily as needed (for gas relief).    Yes [provider]  venlafaxine XR (EFFEXOR-XR) 37.5 MG 24 hr capsule TAKE 1 CAPSULE BY MOUTH ONCE DAILY WITH BREAKFAST 05/08/17  Yes Luking, Elayne Snare, MD  chlorpheniramine-HYDROcodone (TUSSIONEX PENNKINETIC ER) 10-8 MG/5ML SUER Take 5 mLs by mouth every 12 (twelve) hours as needed for cough. Patient not taking: Reported on 05/26/2017 03/06/17   Kathyrn Drown, MD  OXYGEN Inhale into the lungs. 2 liters -Nasal [place]    [provider]    Review of Systems:  Constitutional: Denies fever, chills, diaphoresis, appetite change and fatigue.  HEENT: Denies photophobia, eye pain, redness, hearing loss, ear pain, congestion, sore throat, rhinorrhea, sneezing, mouth sores, trouble swallowing, neck pain, neck stiffness and tinnitus.   Respiratory: Denies  cough, chest tightness,  and wheezing.   Cardiovascular: Denies chest pain, palpitations and leg swelling.  Gastrointestinal: Denies nausea, vomiting, abdominal pain, diarrhea, constipation, blood in stool and abdominal distention.  Genitourinary: Denies dysuria, urgency, frequency, hematuria, flank pain and difficulty urinating.  Endocrine: Denies: hot or cold intolerance, sweats, changes in hair or nails, polyuria, polydipsia. Musculoskeletal: Denies myalgias, back pain, joint swelling, arthralgias and gait problem.  Skin: Denies pallor, rash and wound.  Neurological: Denies dizziness, seizures, syncope,  light-headedness, numbness and headaches.  Hematological: Denies adenopathy. Easy bruising, personal or family bleeding history  Psychiatric/Behavioral: Denies suicidal ideation, mood changes, confusion, nervousness, sleep  disturbance and agitation    Physical Exam: Vitals:   06/12/17 1219 06/12/17 1300 06/12/17 1400 06/12/17 1500  BP:  123/81 109/85   Pulse:  (!) 143 (!) 139 (!) 141  Resp:  18 18 (!) 23  Temp: 98.1 F (36.7 C)     TempSrc: Oral     SpO2:  96% 95% 96%  Weight:      Height:         Constitutional: NAD, calm, comfortable, emaciated Eyes: PERRL, lids and conjunctivae normal ENMT: Mucous membranes are moist. Posterior pharynx clear of any exudate or lesions.Normal dentition.  Neck: normal, supple, no masses, no thyromegaly Respiratory: clear to auscultation bilaterally, no wheezing, no crackles. Normal respiratory effort. No accessory muscle use.  Cardiovascular: Regular rate and rhythm, no murmurs / rubs / gallops. No extremity edema. 2+ pedal pulses. No carotid bruits.  Abdomen: no tenderness, no masses palpated. No hepatosplenomegaly. Bowel sounds positive.  Musculoskeletal: no clubbing / cyanosis. No joint deformity upper and lower extremities. Good ROM, no contractures. Normal muscle tone.  Skin: no rashes, lesions, ulcers. No induration Neurologic: CN 2-12 grossly intact. Sensation intact, DTR normal. Strength 5/5 in all 4.  Psychiatric: Normal judgment and insight. Alert and oriented x 3. Normal mood.    Labs on Admission: I have personally reviewed the following labs and imaging studies  CBC: Recent Labs  Lab 06/12/17 0347  WBC 15.4*  NEUTROABS 12.1*  HGB 12.2  HCT 38.9  MCV 99.0  PLT 299   Basic Metabolic Panel: Recent Labs  Lab 06/12/17 0347  NA 136  K 4.1  CL 102  CO2 26  GLUCOSE 218*  BUN 18  CREATININE 1.33*  CALCIUM 8.4*  MG 1.3*  PHOS 3.0   GFR: Estimated Creatinine Clearance: 16.6 mL/min (A) (by C-G formula based on SCr of 1.33 mg/dL (H)). Liver Function Tests: Recent Labs  Lab 06/12/17 0347  AST 21  ALT 10*  ALKPHOS 120  BILITOT 0.6  PROT 7.4  ALBUMIN 3.3*   No results for input(s): LIPASE, AMYLASE in the last 168 hours. No results  for input(s): AMMONIA in the last 168 hours. Coagulation Profile: No results for input(s): INR, PROTIME in the last 168 hours. Cardiac Enzymes: Recent Labs  Lab 06/12/17 0347 06/12/17 0816  TROPONINI <0.03 <0.03   BNP (last 3 results) No results for input(s): PROBNP in the last 8760 hours. HbA1C: No results for input(s): HGBA1C in the last 72 hours. CBG: No results for input(s): GLUCAP in the last 168 hours. Lipid Profile: No results for input(s): CHOL, HDL, LDLCALC,  TRIG, CHOLHDL, LDLDIRECT in the last 72 hours. Thyroid Function Tests: No results for input(s): TSH, T4TOTAL, FREET4, T3FREE, THYROIDAB in the last 72 hours. Anemia Panel: No results for input(s): VITAMINB12, FOLATE, FERRITIN, TIBC, IRON, RETICCTPCT in the last 72 hours. Urine analysis:    Component Value Date/Time   COLORURINE AMBER (A) 08/22/2016 1747   APPEARANCEUR CLEAR 08/22/2016 1747   LABSPEC 1.010 08/22/2016 1747   PHURINE 5.0 08/22/2016 1747   GLUCOSEU NEGATIVE 08/22/2016 1747   HGBUR NEGATIVE 08/22/2016 1747   BILIRUBINUR NEGATIVE 08/22/2016 1747   KETONESUR NEGATIVE 08/22/2016 1747   PROTEINUR NEGATIVE 08/22/2016 1747   UROBILINOGEN 0.2 08/30/2014 2021   NITRITE POSITIVE (A) 08/22/2016 1747   LEUKOCYTESUR NEGATIVE 08/22/2016 1747   Sepsis Labs: _0 (procalcitonin:4,lacticidven:4) )No results found for this or any previous visit (from the past 240 hour(s)).   Radiological Exams on Admission: Dg Chest Port 1 View  Result Date: 06/12/2017 CLINICAL DATA:  Shortness of breath for 3 days, dizziness and weakness. Wheezing. EXAM: PORTABLE CHEST 1 VIEW COMPARISON:  Chest radiograph May 26, 2017. CT chest April 30, 2017 FINDINGS: Cardiac silhouette is mildly enlarged and unchanged. Calcified aortic knob. Increased lung volumes with diffusely coarsened interstitium, small bilateral pleural effusions RIGHT upper lobe cavitation and scarring with additional small cavitary nodules better seen on  prior CT. Scattered parenchymal calcifications. No pneumothorax. Widened at LEFT acromioclavicular joint space may be postoperative. Surgical clips projected GE junction. IMPRESSION: Increasing small pleural effusions. Pleuroparenchymal scarring better characterized April 30, 2017, superimposed pneumonia possible. Mild cardiomegaly. Aortic Atherosclerosis (ICD10-I70.0). Electronically Signed   By: Elon Alas M.D.   On: 06/12/2017 03:37    EKG: Independently reviewed. A flutter at a rate of 97  Assessment/Plan Principal Problem:   Tachy-brady syndrome (HCC) Active Problems:   Gastroparesis   Osteoporosis   Ileostomy in place [facility])   Chronic respiratory failure with hypoxia (HCC)   Tachycardia    TAchy Brady Syndrome -Seen by cardiology. They believe most appropriate treatment plan is transfer to [facility] to be evaluated by EP service for consideration of pacemaker, given she is symptomatic with tachycardia but has 5 sec pauses when meds are titrated. -Continue Xarelto for stroke prophylaxis. -Currently on metoprolol 12.5 mg BID as well as a cardizem drip.  Chronic hypoxemic resp failure -Uses 2 L of oxygen chronically. -On Ct on 11/17 found to have findings c/w MAC. -Discussed with Dr. Linus Salmons via phone today: no need for treatment. Would obtain AFB sputums but no need for respiratory isolation. -See no need for pulmonary evaluation at present, altho should have OP follow up.  Gastroparesis -Continue reglan.     DVT prophylaxis: Xarelto  Code Status: Full code  Family Communication: caregiver at bedside  Disposition Plan: transfer to [place] called: Cardiology, ID  Admission status: inpatient    Time Spent: 85 minutes  Miran Kautzman Isaac Bliss MD [facility] Pager [phone]  If 7PM-7AM, please contact night-coverage [email or web address] Password [email or web address]  06/12/2017, 4:15 PM

## 2017-06-12 NOTE — ED Notes (Signed)
Pt c/o sob that has increased, heart rhythm is a-fib on monitor with rate between 120-150's, pt states that she has hx of irregular heartbeat.

## 2017-06-12 NOTE — Consult Note (Addendum)
CARDIOLOGY CONSULT NOTE    Patient ID: [name]; [id number]; 1929/01/21   Admit date: 06/12/2017 Date of Consult: 06/12/2017  Primary Care Provider: Kathyrn Drown, MD Primary Cardiologist: Kate Sable MD  Patient Profile:   [name] is an 83 y.o. female with a history of chronic interstitial and cavitary lung disease (question MAI), atrial fibrillation/flutter with tachycardia-bradycardia syndrome based on recent workup, and hypothyroidism, who is being seen today for the evaluation of atrial fibrillation with RVR at the request of Dr. Jerilee Hoh, Hospitalist service.   History of Present Illness:   [name] was recently discharged from [facility] on 06/04/2017 after admission for A fib RVR in the setting of chronic hypoxic respiratory failure.  She was placed on Xarelto for stroke prophylaxis and managed with heart rate control strategy, complicated somewhat by concurrent tachycardia-bradycardia syndrome with intermittent pauses.  Patient presents reporting increasing shortness of breath, particularly with ambulation and her regular caregiver also indicates that the patient's heart rate has been significantly elevated when she is up moving around.  She reports compliance with her medications including Cardizem CD and metoprolol.  She has not had any dizziness or syncope.  She had a more significant episode of shortness of breath that woke her up in the middle the night and presented to the EMR via EMS.  She has been placed on Cardizem drip at 15 mg/h following ER assessment, heart rate still in the 140s and atrial fibrillation/flutter.  Follow-up chest x-ray shows increasing small pleural effusions with pleural parenchymal scarring and possible superimposed infiltrate.  Troponin I levels are negative.  BNP elevated at 1758, but recent echocardiogram indicated LVEF 55-60%.  She does have moderate pulmonary hypertension with PASP 49 mmHg.  She follows with Dr. Wolfgang Phoenix for PCP,  has not had a recent pulmonary visit with Dr. Luan Pulling in quite some time for discussion with patient's niece, POA.  Past Medical History:  Diagnosis Date  . Anemia   . Atrial fibrillation ([place])   . 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 Chronic abdominal pain   . Chronic neck and back pain   . COPD (chronic obstructive pulmonary disease) ([place])   . GAD (generalized anxiety disorder)   . Gastroesophageal reflux disease   . Gastroparesis   . IBS (irritable bowel syndrome)   . Migraines   . Osteoporosis   . 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 Renal insufficiency   . S/P colostomy ([place])   . Tachycardia-bradycardia syndrome [facility])     Past Surgical History:  Procedure Laterality Date  . ABDOMINAL HYSTERECTOMY    . APPENDECTOMY    . CHOLECYSTECTOMY    . COLOSTOMY    . ESOPHAGOGASTRODUODENOSCOPY    . ESOPHAGOGASTRODUODENOSCOPY N/A 08/25/2015   Procedure: ESOPHAGOGASTRODUODENOSCOPY (EGD);  Surgeon: Rogene Houston, MD;  Location: [facility] ENDO [facility];  Service: Endoscopy;  Laterality: N/A;  730  . ESOPHAGOGASTRODUODENOSCOPY N/A 06/03/2017   Procedure: ESOPHAGOGASTRODUODENOSCOPY (EGD);  Surgeon: Rogene Houston, MD;  Location: [facility] ENDO SUITE;  Service: Endoscopy;  Laterality: N/A;  . FOREIGN BODY REMOVAL  06/03/2017   Procedure: FOREIGN BODY REMOVAL;  Surgeon: Rogene Houston, MD;  Location: [facility];  Service: Endoscopy;;  . ILEOSCOPY N/A 08/25/2015   Procedure: ILEOSCOPY THROUGH STOMA;  Surgeon: Rogene Houston, MD;  Location: [facility];  Service: Endoscopy;  Laterality: N/A;  . PARTIAL GASTRECTOMY  1967   3/4 gastric resection for bleeding ulcers  . TONSILLECTOMY       Home Medications:  Prior to Admission medications   Medication Sig Start Date End Date Taking? Authorizing Provider  citalopram (CELEXA) 10 MG tablet TAKE (1) TABLET BY MOUTH ONCE DAILY. 01/20/17  Yes Luking, Scott A, MD  diazepam (VALIUM) 2 MG tablet Take 1 tablets twice a daily as needed. Take sparingly (Prescription must last  30 days) 03/06/17  Yes Kathyrn Drown, MD  diltiazem (CARDIZEM CD) 360 MG 24 hr capsule Take 1 capsule (360 mg total) by mouth daily. 06/05/17  Yes Erline Hau, MD  ipratropium (ATROVENT HFA) 17 MCG/ACT inhaler 2 PUFFS FOUR TIMES DAILY. Patient taking differently: Inhale 2 puffs into the lungs 4 (four) times daily.  09/20/16  Yes Luking, Scott A, MD  ipratropium (ATROVENT) 0.03 % nasal spray Place 2 sprays into both nostrils every 12 (twelve) hours. 04/29/17  Yes Luking, Elayne Snare, MD  levothyroxine (SYNTHROID, LEVOTHROID) 25 MCG tablet TAKE 2 TABLETS BY MOUTH ON MONDAY AND FRIDAY. & 1 TABLET ALL OTHER DAYS 05/08/17  Yes Luking, Scott A, MD  lipase/protease/amylase (CREON) 12000 units CPEP capsule TAKE 2 CAPSULES BY MOUTH 3 TIMES DAILY WITH MEALS. TAKE 1 CAPSULE TWICE DAILY WITH SNACKS 04/23/17  Yes Rehman, Mechele Dawley, MD  loperamide (IMODIUM) 2 MG capsule TAKE (1) CAPSULE THREE TIMES DAILY BEFORE MEALS. 02/07/17  Yes Rehman, Mechele Dawley, MD  memantine (NAMENDA) 10 MG tablet TAKE 1 TABLET BY MOUTH TWICE DAILY. 02/20/17  Yes Luking, Elayne Snare, MD  metoCLOPramide (REGLAN) 5 MG tablet Take 1 tablet (5 mg total) by mouth 4 (four) times daily -  before meals and at bedtime. 06/04/17 06/04/18 Yes Erline Hau, MD  metoprolol tartrate (LOPRESSOR) 25 MG tablet TAKE 1/2 TABLET TWICE DAILY FOR HIGH BLOOD PRESSURE. 01/20/17  Yes Kathyrn Drown, MD  mupirocin ointment (BACTROBAN) 2 % Apply to affected area 2 times daily 05/15/17 05/15/18 Yes Luking, Elayne Snare, MD  potassium chloride (K-DUR) 10 MEQ tablet TAKE (1) TABLET BY MOUTH ONCE DAILY. 05/08/17  Yes Kathyrn Drown, MD  Rivaroxaban (XARELTO) 15 MG TABS tablet Take 1 tablet (15 mg total) by mouth daily with supper. 06/04/17  Yes Isaac Bliss, Rayford Halsted, MD  venlafaxine XR (EFFEXOR-XR) 37.5 MG 24 hr capsule TAKE 1 CAPSULE BY MOUTH ONCE DAILY WITH BREAKFAST 05/08/17  Yes Kathyrn Drown, MD  acetaminophen (TYLENOL) 325 MG tablet Take 2 tablets (650  mg total) by mouth every 6 (six) hours as needed for mild pain or headache (or Fever >/= 101). 12/22/15   Samuella Cota, MD  chlorpheniramine-HYDROcodone [facility] PENNKINETIC ER) 10-8 MG/5ML SUER Take 5 mLs by mouth every 12 (twelve) hours as needed for cough. Patient not taking: Reported on 05/26/2017 03/06/17   Kathyrn Drown, MD  Doxylamine-DM (ROBITUSSIN NIGHTTIME COUGH DM) 12.5-30 MG/10ML LIQD Take 5 mLs by mouth daily as needed (for cough). Patient taked 1 teaspoon as needed.     [provider]  Melatonin 5 MG CAPS Take 5 mg by mouth at bedtime.    [provider]  OXYGEN Inhale into the lungs. 2 liters -Nasal Canual    [provider]  Simethicone (GAS RELIEF PO) Take 1 tablet by mouth daily as needed (for gas relief).     [provider]    Allergies:    Allergies  Allergen Reactions  . Levaquin [Levofloxacin] Other (See Comments)    Patient states that she became weak and extremely tired. Also, it made her real dizzy.  . Aspirin Nausea And Vomiting  Nausea/vomiting blood  . Tetanus Toxoid Hives and Swelling  . Zolpidem Tartrate     Not in right state of mind  . Fosamax [Alendronate Sodium] Other (See Comments)    Severe gastritis reflux.    Social History:   Social History   Socioeconomic History  . Marital status: Married    Spouse name: Not on file  . Number of children: Not on file  . Years of education: Not on file  . Highest education level: Not on file  Social Needs  . Financial resource strain: Not on file  . Food insecurity - worry: Not on file  . Food insecurity - inability: Not on file  . Transportation needs - medical: Not on file  . Transportation needs - non-medical: Not on file  Occupational History  . Not on file  Tobacco Use  . Smoking status: Never Smoker  . Smokeless tobacco: Never Used  Substance and Sexual Activity  . Alcohol use: No    Alcohol/week: 0.0 oz  . Drug use: No  . Sexual activity: Not  on file  Other Topics Concern  . Not on file  Social History Narrative  . Not on file    Family History:    Family History  Problem Relation Age of Onset  . Stroke Mother   . Stroke Father   . Diabetes Sister   . Diabetes Brother   . Stroke Brother   . Cancer Sister        unknown type  . Diabetes Sister   . Emphysema Sister   . Diabetes Brother   . Stroke Brother   . Diabetes Son   . Irritable bowel syndrome Son   . Diabetes Son   . Hypertension Son      ROS:  Please see the history of present illness.  ROS  Weakness and chronic shortness of breath. All other ROS reviewed and negative.     Physical Exam/Data:   Vitals:   06/12/17 0515 06/12/17 0545 06/12/17 0638 06/12/17 0811  BP: (!) 136/105 (!) 152/90 (!) 169/110   Pulse: (!) 140 (!) 143 (!) 146 (!) 143  Resp: (!) 25 (!) 26 (!) 22 (!) 21  Temp:    98.9 F (37.2 C)  TempSrc:    Oral  SpO2: 90% 92% 92% 93%  Weight:    76 lb 8 oz (34.7 kg)  Height:    4\' 11"  (1.499 m)    Intake/Output Summary (Last 24 hours) at 06/12/2017 1008 Last data filed at 06/12/2017 1000 Gross per 24 hour  Intake 148.96 ml  Output 1000 ml  Net -851.04 ml   Filed Weights   06/12/17 0228 06/12/17 0811  Weight: 83 lb (37.6 kg) 76 lb 8 oz (34.7 kg)   Body mass index is 15.45 kg/m.  General: Frail-appearing elderly woman in no distress. HEENT: normal Lymph: no adenopathy Neck: no JVD Endocrine:  No thryomegaly Vascular: No carotid bruits  Cardiac: Irregularly irregular without gallop Lungs:  Decreased breath sounds with scattered crackles and rhonchi in the mid to lower lung zones Abd: Soft, nontender, no hepatomegaly  Ext: No edema Musculoskeletal: Kyphosis noted. BUE and BLE strength normal and equal Skin: Warm and dry  Neuro:  CNs 2-12 intact, no focal abnormalities noted Psych:  Normal affect    EKG:  The EKG was personally reviewed and demonstrates: Probable atrial flutter with variable conduction and IVCD. Telemetry:   Telemetry was personally reviewed and demonstrates: Atrial fibrillation/flutter with RVR.  Relevant CV  Studies:  Echocardiogram 05/27/2017 Study Conclusions  - Left ventricle: The cavity size was normal. Wall thickness was   increased in a pattern of mild LVH. Systolic function was normal.   The estimated ejection fraction was in the range of 55% to 60%.   Wall motion was normal; there were no regional wall motion   abnormalities. The study was not technically sufficient to allow   evaluation of LV diastolic dysfunction due to atrial   fibrillation. Doppler parameters are consistent with high   ventricular filling pressure. - Aortic valve: Trileaflet; mildly thickened, mildly calcified   leaflets. There was mild stenosis. There was mild regurgitation. - Mitral valve: Moderately calcified annulus. Mildly thickened   leaflets . There was mild regurgitation. - Left atrium: The atrium was mildly to moderately dilated. - Atrial septum: No defect or patent foramen ovale was identified. - Tricuspid valve: Mildly thickened leaflets. There was moderate   regurgitation. - Pulmonary arteries: PA peak pressure: 49 mm Hg (S).  Laboratory Data:  Chemistry Recent Labs  Lab 06/12/17 0347  NA 136  K 4.1  CL 102  CO2 26  GLUCOSE 218*  BUN 18  CREATININE 1.33*  CALCIUM 8.4*  GFRNONAA 35*  GFRAA 41*  ANIONGAP 8    Recent Labs  Lab 06/12/17 0347  PROT 7.4  ALBUMIN 3.3*  AST 21  ALT 10*  ALKPHOS 120  BILITOT 0.6   Hematology Recent Labs  Lab 06/12/17 0347  WBC 15.4*  RBC 3.93  HGB 12.2  HCT 38.9  MCV 99.0  MCH 31.0  MCHC 31.4  RDW 14.7  PLT 369   Cardiac Enzymes Recent Labs  Lab 06/12/17 0347 06/12/17 0816  TROPONINI <0.03 <0.03   No results for input(s): TROPIPOC in the last 168 hours.   BNP Recent Labs  Lab 06/12/17 0347  BNP 1,758.0*    Radiology/Studies:  Dg Chest Port 1 View  Result Date: 06/12/2017 CLINICAL DATA:  Shortness of breath for 3 days,  dizziness and weakness. Wheezing. EXAM: PORTABLE CHEST 1 VIEW COMPARISON:  Chest radiograph May 26, 2017. CT chest April 30, 2017 FINDINGS: Cardiac silhouette is mildly enlarged and unchanged. Calcified aortic knob. Increased lung volumes with diffusely coarsened interstitium, small bilateral pleural effusions RIGHT upper lobe cavitation and scarring with additional small cavitary nodules better seen on prior CT. Scattered parenchymal calcifications. No pneumothorax. Widened at LEFT acromioclavicular joint space may be postoperative. Surgical clips projected GE junction. IMPRESSION: Increasing small pleural effusions. Pleuroparenchymal scarring better characterized April 30, 2017, superimposed pneumonia possible. Mild cardiomegaly. Aortic Atherosclerosis (ICD10-I70.0). Electronically Signed   By: Elon Alas M.D.   On: 06/12/2017 03:37    Assessment and Plan:   1.  Persistent atrial fibrillation/flutter, currently with RVR despite reported compliance with outpatient medical regimen including Cardizem CD 360 mg daily and Lopressor 12.5 mg twice daily.  She is also on Xarelto for stroke prophylaxis.  At present she has been placed on Cardizem infusion at 15 mg/h.  2.  Tachycardia-bradycardia syndrome.  During her most recent hospital stay she had pauses as long as 3-5 seconds requiring reduction in heart rate control regimen.  3.  History of chronic interstitial and cavitary lung disease, details are not clear.  Chart mentions history of pulmonary TB but her recent chest CT indicated possibility of MAI.  She has seen Dr. Luan Pulling in the past, no recent pulmonary follow-up.  She uses oxygen for chronic hypoxic respiratory failure.  4.  CKD stage III, current creatinine 1.3.  Reviewed recent hospitalization and workup by Dr. Bronson Ing and Dr. Harl Bowie.  Discussed situation with patient and her niece, POA.  My concern is that with continued symptomatic atrial fibrillation/flutter due to elevated  heart rates, and her prior documentation of significant pauses when medical therapy for heart rate control was advanced, that we will be facing the same issues now.  I would recommend having her transferred to [facility] on the hospitalist team so that the EP service can be involved in her care regarding whether pacemaker would be ultimately a consideration.  It may also be worth having her get a pulmonary consultation as well.    For questions or updates, please contact [place] Please consult [email or web address] for contact info under Cardiology/STEMI.   Satira Sark, M.D., F.A.C.C.

## 2017-06-12 NOTE — Progress Notes (Signed)
Dr. Domenic Polite, cardiology informed of new consult

## 2017-06-12 NOTE — ED Triage Notes (Signed)
Pt c/o increased sob. Per ems pt was 90% on 2 lpm when they arrived now pt is on 3lpm and is 96%. Pt is on continuous oxygen at home.

## 2017-06-13 ENCOUNTER — Other Ambulatory Visit: Payer: Self-pay | Admitting: Family Medicine

## 2017-06-13 DIAGNOSIS — I4891 Unspecified atrial fibrillation: Secondary | ICD-10-CM

## 2017-06-13 DIAGNOSIS — J9611 Chronic respiratory failure with hypoxia: Secondary | ICD-10-CM

## 2017-06-13 LAB — CBC
HCT: 37.2 % (ref 36.0–46.0)
HEMOGLOBIN: 11.7 g/dL — AB (ref 12.0–15.0)
MCH: 30.8 pg (ref 26.0–34.0)
MCHC: 31.5 g/dL (ref 30.0–36.0)
MCV: 97.9 fL (ref 78.0–100.0)
Platelets: 352 10*3/uL (ref 150–400)
RBC: 3.8 MIL/uL — AB (ref 3.87–5.11)
RDW: 14.2 % (ref 11.5–15.5)
WBC: 16.2 10*3/uL — ABNORMAL HIGH (ref 4.0–10.5)

## 2017-06-13 LAB — BASIC METABOLIC PANEL
ANION GAP: 11 (ref 5–15)
BUN: 19 mg/dL (ref 6–20)
CALCIUM: 8.6 mg/dL — AB (ref 8.9–10.3)
CO2: 33 mmol/L — AB (ref 22–32)
Chloride: 92 mmol/L — ABNORMAL LOW (ref 101–111)
Creatinine, Ser: 1.31 mg/dL — ABNORMAL HIGH (ref 0.44–1.00)
GFR, EST AFRICAN AMERICAN: 41 mL/min — AB (ref >60)
GFR, EST NON AFRICAN AMERICAN: 36 mL/min — AB (ref >60)
Glucose, Bld: 161 mg/dL — ABNORMAL HIGH (ref 65–99)
Potassium: 3.6 mmol/L (ref 3.5–5.1)
Sodium: 136 mmol/L (ref 135–145)

## 2017-06-13 LAB — GLUCOSE, CAPILLARY: GLUCOSE-CAPILLARY: 140 mg/dL — AB (ref 65–99)

## 2017-06-13 MED ORDER — TRAZODONE HCL 50 MG PO TABS
50.0000 mg | ORAL_TABLET | Freq: Once | ORAL | Status: AC
  Administered 2017-06-13: 50 mg via ORAL
  Filled 2017-06-13: qty 1

## 2017-06-13 MED ORDER — MAGNESIUM OXIDE 400 (241.3 MG) MG PO TABS
200.0000 mg | ORAL_TABLET | Freq: Every day | ORAL | Status: DC
  Administered 2017-06-13 – 2017-06-16 (×4): 200 mg via ORAL
  Administered 2017-06-17: 400 mg via ORAL
  Administered 2017-06-18 – 2017-06-20 (×3): 200 mg via ORAL
  Filled 2017-06-13 (×8): qty 1

## 2017-06-13 MED ORDER — MAGNESIUM SULFATE 2 GM/50ML IV SOLN
2.0000 g | Freq: Once | INTRAVENOUS | Status: AC
  Administered 2017-06-13: 2 g via INTRAVENOUS
  Filled 2017-06-13: qty 50

## 2017-06-13 NOTE — Progress Notes (Signed)
PROGRESS NOTE    [name]  OXB:[id number] DOB: 17-Jan-1929 DOA: 06/12/2017 PCP: Kathyrn Drown, MD     Brief Narrative:  83 year old woman admitted from home on 11/29 due to shortness of breath and weakness.  She was recently discharged due to tachybradycardia syndrome.  During her prior hospitalization there was difficulty adjusting her rate medications due to long pauses of up to 5 seconds when doses were increased, followed by rapid rates when doses were decreased.  Cardiology was involved at that time and decision was finally made to discharge her on metoprolol 12.5 twice daily and Cardizem CD 360 mg.  She was found to again be in A. fib/flutter with rates in the 140s-160s.  Cardiology was consulted and recommended transfer to [facility] to be evaluated by the EP service.  Transfer was initiated on 11/29, however as of 11/30 she still does not have a bed and remains at [facility]   Assessment & Plan:   Principal Problem:   Tachy-brady syndrome (HCC) Active Problems:   Gastroparesis   Osteoporosis   Ileostomy in place [facility])   Chronic respiratory failure with hypoxia (HCC)   Tachycardia   Atrial fibrillation/flutter with RVR -She remains in A. fib/flutter as of today with heart rate still not adequately controlled on high-dose IV diltiazem drip and low-dose metoprolol.   -She has had some pauses close to 2 seconds on telemetry overnight. -Rates remain difficult to control, limited by associated tachycardia/bradycardia syndrome with pauses up to 3-5 seconds on her recent admission that limited escalating dose of AV nodal blockers. -She remains on Xarelto for stroke prophylaxis. -Plan to transfer to [facility] once bed available to allow evaluation by EP service and consideration of pacemaker.  Chronic hypoxemic failure -Uses 2 L of oxygen chronically. -CT scan of the chest dated 11/17 was found to have findings that could have been consistent with MAC. -I personally  discussed via phone with Dr. Linus Salmons, infectious diseases on 11/29.  He states no need for treatment.  He would obtain AFB sputums but there is no need for respiratory isolation. -I see no need for pulmonary evaluation at present, if situation changes could have pulmonary at [facility] assess her.  Gastroparesis -Continue Reglan.   DVT prophylaxis: Xarelto   Code Status: full code Family Communication: Caregiver at bedside Disposition Plan: Transfer to [facility] once bed available  Consultants:   Cardiology  ID via phone  Procedures:   None  Antimicrobials:  Anti-infectives (From admission, onward)   None       Subjective: Feels improved, less short of breath except when walking to the bathroom.  Denies chest pain or palpitations.  Objective: Vitals:   06/13/17 1000 06/13/17 1230 06/13/17 1300 06/13/17 1330  BP: 133/85 124/89 116/62 102/81  Pulse: (!) 58 98 92 (!) 25  Resp: (!) 23 19 (!) 21 16  Temp:      TempSrc:      SpO2: 98% 96% 98% 96%  Weight:      Height:        Intake/Output Summary (Last 24 hours) at 06/13/2017 1410 Last data filed at 06/13/2017 1200 Gross per 24 hour  Intake 270 ml  Output 1150 ml  Net -880 ml   Filed Weights   06/12/17 0228 06/12/17 0811 06/13/17 0500  Weight: 37.6 kg (83 lb) 34.7 kg (76 lb 8 oz) 34.7 kg (76 lb 8 oz)    Examination:  General exam: Alert, awake, oriented x 3 Respiratory system: Bilateral  dry crackles, no rhonchi Cardiovascular system: Tachycardic, irregular, no murmurs Gastrointestinal system: Abdomen is nondistended, soft and nontender. No organomegaly or masses felt. Normal bowel sounds heard. Central nervous system: Alert and oriented. No focal neurological deficits. Extremities: No C/C/E, +pedal pulses Skin: No rashes, lesions or ulcers Psychiatry: Judgement and insight appear normal. Mood & affect appropriate.     Data Reviewed: I have personally reviewed following labs and imaging studies  CBC: Recent  Labs  Lab 06/12/17 0347 06/13/17 0447  WBC 15.4* 16.2*  NEUTROABS 12.1*  --   HGB 12.2 11.7*  HCT 38.9 37.2  MCV 99.0 97.9  PLT 369 053   Basic Metabolic Panel: Recent Labs  Lab 06/12/17 0347 06/13/17 0447  NA 136 136  K 4.1 3.6  CL 102 92*  CO2 26 33*  GLUCOSE 218* 161*  BUN 18 19  CREATININE 1.33* 1.31*  CALCIUM 8.4* 8.6*  MG 1.3*  --   PHOS 3.0  --    GFR: Estimated Creatinine Clearance: 16.9 mL/min (A) (by C-G formula based on SCr of 1.31 mg/dL (H)). Liver Function Tests: Recent Labs  Lab 06/12/17 0347  AST 21  ALT 10*  ALKPHOS 120  BILITOT 0.6  PROT 7.4  ALBUMIN 3.3*   No results for input(s): LIPASE, AMYLASE in the last 168 hours. No results for input(s): AMMONIA in the last 168 hours. Coagulation Profile: No results for input(s): INR, PROTIME in the last 168 hours. Cardiac Enzymes: Recent Labs  Lab 06/12/17 0347 06/12/17 0816  TROPONINI <0.03 <0.03   BNP (last 3 results) No results for input(s): PROBNP in the last 8760 hours. HbA1C: No results for input(s): HGBA1C in the last 72 hours. CBG: No results for input(s): GLUCAP in the last 168 hours. Lipid Profile: No results for input(s): CHOL, HDL, LDLCALC, TRIG, CHOLHDL, LDLDIRECT in the last 72 hours. Thyroid Function Tests: Recent Labs    06/12/17 0827  TSH 1.955   Anemia Panel: No results for input(s): VITAMINB12, FOLATE, FERRITIN, TIBC, IRON, RETICCTPCT in the last 72 hours. Urine analysis:    Component Value Date/Time   COLORURINE AMBER (A) 08/22/2016 1747   APPEARANCEUR CLEAR 08/22/2016 1747   LABSPEC 1.010 08/22/2016 1747   PHURINE 5.0 08/22/2016 1747   GLUCOSEU NEGATIVE 08/22/2016 1747   HGBUR NEGATIVE 08/22/2016 1747   BILIRUBINUR NEGATIVE 08/22/2016 1747   KETONESUR NEGATIVE 08/22/2016 1747   PROTEINUR NEGATIVE 08/22/2016 1747   UROBILINOGEN 0.2 08/30/2014 2021   NITRITE POSITIVE (A) 08/22/2016 1747   LEUKOCYTESUR NEGATIVE 08/22/2016 1747   Sepsis  Labs: _0 (procalcitonin:4,lacticidven:4)  )No results found for this or any previous visit (from the past 240 hour(s)).       Radiology Studies: Dg Chest Port 1 View  Result Date: 06/12/2017 CLINICAL DATA:  Shortness of breath for 3 days, dizziness and weakness. Wheezing. EXAM: PORTABLE CHEST 1 VIEW COMPARISON:  Chest radiograph May 26, 2017. CT chest April 30, 2017 FINDINGS: Cardiac silhouette is mildly enlarged and unchanged. Calcified aortic knob. Increased lung volumes with diffusely coarsened interstitium, small bilateral pleural effusions RIGHT upper lobe cavitation and scarring with additional small cavitary nodules better seen on prior CT. Scattered parenchymal calcifications. No pneumothorax. Widened at LEFT acromioclavicular joint space may be postoperative. Surgical clips projected GE junction. IMPRESSION: Increasing small pleural effusions. Pleuroparenchymal scarring better characterized April 30, 2017, superimposed pneumonia possible. Mild cardiomegaly. Aortic Atherosclerosis (ICD10-I70.0). Electronically Signed   By: Elon Alas M.D.   On: 06/12/2017 03:37        Scheduled Meds: .  citalopram  10 mg Oral Daily  . levothyroxine  25 mcg Oral QAC breakfast  . lipase/protease/amylase  12,000 Units Oral TID AC  . magnesium oxide  200 mg Oral Daily  . mouth rinse  15 mL Mouth Rinse BID  . memantine  10 mg Oral BID  . metoCLOPramide  5 mg Oral TID AC & HS  . metoprolol tartrate  12.5 mg Oral BID  . Rivaroxaban  15 mg Oral Q supper  . sodium chloride flush  3 mL Intravenous Q12H  . venlafaxine XR  37.5 mg Oral Q breakfast   Continuous Infusions: . sodium chloride    . diltiazem (CARDIZEM) infusion 15 mg/hr (06/13/17 1100)     LOS: 1 day    Time spent: 35 minutes. Greater than 50% of this time was spent in direct contact with the patient coordinating care.     Lelon Frohlich, MD [facility] Pager [phone]  If 7PM-7AM,  please contact night-coverage [email or web address] Password [email or web address] 06/13/2017, 2:10 PM

## 2017-06-13 NOTE — Progress Notes (Signed)
Acceptance note  Patient transferred from [facility] for [facility] consultation. She had been waiting at [facility] since last night due to lack of beds at [facility].  I met patient and family at bedside. Patient is getting ready to eat her dinner. She reports chronic dyspnea on exertion but denies any chest pain, palpitations, dizziness or lightheadedness.  Exam Elderly female, small built, cachectic, chronically ill-looking, sitting up comfortably in bed without distress. Nasal cannula oxygen on. Vital signs stable. Respiratory system: Reduced breath sounds bilaterally with few scattered crackles. No wheezing or rhonchi. No increased work of breathing. Cardiovascular system: S1 and S2 heard, irregularly irregular. No JVD, murmurs or pedal edema. Telemetry shows A. fib with controlled ventricular rate and no significant pauses CNS: Alert and oriented. No focal neurological deficits Abdomen: Nondistended, soft and nontender. Colostomy bag +. Extremities: Symmetric power. No edema.  Assessment and plan:  Persistent atrial flutter/fibrillation: Currently appears rate controlled on diltiazem GTT at 15 mg/h and metoprolol 12.5 MG twice a day. Remains on Xarelto. Cardiology was informed.  Chronic hypoxic respiratory failure  Hypothyroid: Continue Synthroid.  I discussed with patient's niece and caregiver at bedside.  Vernell Leep, MD, FACP, [facility]. [facility] Pager [phone]  If 7PM-7AM, please contact night-coverage [email or web address] Password [email or web address] 06/13/2017, 6:06 PM

## 2017-06-13 NOTE — Progress Notes (Signed)
Pt transferred to [facility] via [facility]. Report given to [facility], RN. Family notified.

## 2017-06-13 NOTE — Progress Notes (Addendum)
Progress Note  Patient Name: [name] Date of Encounter: 06/13/2017  Primary Cardiologist: Kate Sable MD  Subjective   Still awaiting bed for transfer to [facility].  Tired. No chest pain. No active palpitations.  Inpatient Medications    Scheduled Meds: . citalopram  10 mg Oral Daily  . levothyroxine  25 mcg Oral QAC breakfast  . lipase/protease/amylase  12,000 Units Oral TID AC  . magnesium oxide  200 mg Oral Daily  . mouth rinse  15 mL Mouth Rinse BID  . memantine  10 mg Oral BID  . metoCLOPramide  5 mg Oral TID AC & HS  . metoprolol tartrate  12.5 mg Oral BID  . Rivaroxaban  15 mg Oral Q supper  . sodium chloride flush  3 mL Intravenous Q12H  . venlafaxine XR  37.5 mg Oral Q breakfast   Continuous Infusions: . sodium chloride    . diltiazem (CARDIZEM) infusion 15 mg/hr (06/13/17 0720)  . magnesium sulfate 1 - 4 g bolus IVPB Stopped (06/13/17 0929)   PRN Meds: sodium chloride, acetaminophen **OR** acetaminophen, acetaminophen, ondansetron **OR** ondansetron (ZOFRAN) IV, senna-docusate, sodium chloride flush   Vital Signs    Vitals:   06/13/17 0400 06/13/17 0500 06/13/17 0600 06/13/17 0700  BP: (!) 107/49 116/74 133/80 (!) 120/49  Pulse: 91 87 91 95  Resp: 19 18 18 15   Temp: 97.8 F (36.6 C)     TempSrc: Axillary     SpO2: 98% 95% 97% 97%  Weight:  76 lb 8 oz (34.7 kg)    Height:        Intake/Output Summary (Last 24 hours) at 06/13/2017 0851 Last data filed at 06/13/2017 0600 Gross per 24 hour  Intake 418.96 ml  Output 2150 ml  Net -1731.04 ml   Filed Weights   06/12/17 0228 06/12/17 0811 06/13/17 0500  Weight: 83 lb (37.6 kg) 76 lb 8 oz (34.7 kg) 76 lb 8 oz (34.7 kg)    Telemetry    Atrial fibrillation. One pause 1.56 sec. Now 80's to 90's. - Personally Reviewed  Physical Exam   GEN: Elderly woman. No acute distress.  Sleeping, will arouse ot verbal stimulation.  Neck: No JVD Cardiac: IRRR, no murmurs, rubs, or gallops.    Respiratory: Clear to auscultation bilaterally. Diminished breath sounds.  GI: Soft, nontender, non-distended  MS: No edema; No deformity. Gause to right arm, (pulled out IV last night). Neuro:  Nonfocal  Psych: Normal affect   Labs    Chemistry Recent Labs  Lab 06/12/17 0347 06/13/17 0447  NA 136 136  K 4.1 3.6  CL 102 92*  CO2 26 33*  GLUCOSE 218* 161*  BUN 18 19  CREATININE 1.33* 1.31*  CALCIUM 8.4* 8.6*  PROT 7.4  --   ALBUMIN 3.3*  --   AST 21  --   ALT 10*  --   ALKPHOS 120  --   BILITOT 0.6  --   GFRNONAA 35* 36*  GFRAA 41* 41*  ANIONGAP 8 11     Hematology Recent Labs  Lab 06/12/17 0347 06/13/17 0447  WBC 15.4* 16.2*  RBC 3.93 3.80*  HGB 12.2 11.7*  HCT 38.9 37.2  MCV 99.0 97.9  MCH 31.0 30.8  MCHC 31.4 31.5  RDW 14.7 14.2  PLT 369 352    Cardiac Enzymes Recent Labs  Lab 06/12/17 0347 06/12/17 0816  TROPONINI <0.03 <0.03   No results for input(s): TROPIPOC in the last 168 hours.   BNP Recent  Labs  Lab 06/12/17 0347  BNP 1,758.0*     Radiology    Dg Chest Port 1 View  Result Date: 06/12/2017 CLINICAL DATA:  Shortness of breath for 3 days, dizziness and weakness. Wheezing. EXAM: PORTABLE CHEST 1 VIEW COMPARISON:  Chest radiograph May 26, 2017. CT chest April 30, 2017 FINDINGS: Cardiac silhouette is mildly enlarged and unchanged. Calcified aortic knob. Increased lung volumes with diffusely coarsened interstitium, small bilateral pleural effusions RIGHT upper lobe cavitation and scarring with additional small cavitary nodules better seen on prior CT. Scattered parenchymal calcifications. No pneumothorax. Widened at LEFT acromioclavicular joint space may be postoperative. Surgical clips projected GE junction. IMPRESSION: Increasing small pleural effusions. Pleuroparenchymal scarring better characterized April 30, 2017, superimposed pneumonia possible. Mild cardiomegaly. Aortic Atherosclerosis (ICD10-I70.0). Electronically Signed   By:  Elon Alas M.D.   On: 06/12/2017 03:37    Cardiac Studies   Echocardiogram 05/27/2017 Study Conclusions  - Left ventricle: The cavity size was normal. Wall thickness was increased in a pattern of mild LVH. Systolic function was normal. The estimated ejection fraction was in the range of 55% to 60%. Wall motion was normal; there were no regional wall motion abnormalities. The study was not technically sufficient to allow evaluation of LV diastolic dysfunction due to atrial fibrillation. Doppler parameters are consistent with high ventricular filling pressure. - Aortic valve: Trileaflet; mildly thickened, mildly calcified leaflets. There was mild stenosis. There was mild regurgitation. - Mitral valve: Moderately calcified annulus. Mildly thickened leaflets . There was mild regurgitation. - Left atrium: The atrium was mildly to moderately dilated. - Atrial septum: No defect or patent foramen ovale was identified. - Tricuspid valve: Mildly thickened leaflets. There was moderate regurgitation. - Pulmonary arteries: PA peak pressure: 49 mm Hg (S).  Patient Profile     83 y.o. female with a history of chronic interstitial and cavitary lung disease (question MAI), atrial fibrillation/flutter with tachycardia-bradycardia syndrome based on recent workup, and hypothyroidism, who is being seen for the evaluation of atrial fibrillation with RVR and tachycardia-bradycardia syndrome. Plan for transfer to [facility] on medical service with EP to consult there for possibility of PPM.  Assessment & Plan    1. Persistent Atrial fibrillation/flutter: Having episodes of Atrial fib, RVR with bradycardia and pauses. She remains on diltiazem gtt at 15 mg hour. Still awaiting transfer to [facility] for Pulmonary and EP evaluation. Continue Xarelto.   2.  Hypomagnesemia: 1.3 on admission. Did not see where she has received replacement yet on review of med orders. . I will give one magnesium  IV bolus, and begin 200 mg daily. Potassium 3.6 this am. Optimizing electrolytes prior to transfer.   3. Tachy-Brady Syndrome: Hx of 3-5 second pauses on previous admission last week. Now some shorter pauses 1.5-2.0. HR have increased to 180 bpm overnight. She is planned for EP consultation once she gets to [facility].   4. CKD Stage III:  Creatinine 1.3. K+ 3.6 (down from 4.1 yesterday). She is not on potassium supplement or diuretics.   Signed, Phill Myron. West Pugh, ANP, AACC   06/13/2017, 8:51 AM     Attending note:  Patient seen and examined.  Reviewed interval hospital course and discussed the case with Ms. West Pugh.  Ms. Both remains in atrial fibrillation/flutter, heart rate better but still not adequately controlled on high-dose intravenous diltiazem and relatively low-dose metoprolol.  She has not had any pauses as long as last admission (3-5 seconds), but showing some closer to 2 seconds.  On examination  she is in no distress.  Current heart rate 100-110.  Systolic blood pressure 588-325.  Lungs exhibit coarse breath sounds throughout with prolonged expiratory phase.  Cardiac exam with irregularly irregular rhythm.  Lab work shows potassium 3.6, magnesium 1.3, BUN 19, creatinine 1.31, hemoglobin 11.7, platelets 352, troponin I negative.  Plan is for patient to transfer to the internal medicine service at [facility] and have EP consultation as well.  She has persistent atrial fibrillation/flutter with difficult to control heart rates, limited by associated tachycardia-bradycardia syndrome with pauses up to 3-5 seconds on her recent admission that limited escalating doses of AV nodal blockers.  She remains on Xarelto for stroke prophylaxis.  Also has associated chronic interstitial and cavitary lung disease with recent chest CT suggesting possibility of MAI.  She has not had recent Pulmonary evaluation.  Satira Sark, M.D., F.A.C.C.

## 2017-06-14 DIAGNOSIS — K3184 Gastroparesis: Secondary | ICD-10-CM

## 2017-06-14 LAB — GLUCOSE, CAPILLARY: Glucose-Capillary: 135 mg/dL — ABNORMAL HIGH (ref 65–99)

## 2017-06-14 MED ORDER — VERAPAMIL HCL 40 MG PO TABS
40.0000 mg | ORAL_TABLET | Freq: Three times a day (TID) | ORAL | Status: DC
  Administered 2017-06-14 – 2017-06-15 (×4): 40 mg via ORAL
  Filled 2017-06-14 (×5): qty 1

## 2017-06-14 MED ORDER — INSULIN ASPART 100 UNIT/ML ~~LOC~~ SOLN
0.0000 [IU] | Freq: Three times a day (TID) | SUBCUTANEOUS | Status: DC
  Administered 2017-06-14: 1 [IU] via SUBCUTANEOUS
  Administered 2017-06-15: 2 [IU] via SUBCUTANEOUS
  Administered 2017-06-16: 5 [IU] via SUBCUTANEOUS
  Administered 2017-06-17: 2 [IU] via SUBCUTANEOUS
  Administered 2017-06-17: 3 [IU] via SUBCUTANEOUS
  Administered 2017-06-18 – 2017-06-19 (×4): 1 [IU] via SUBCUTANEOUS
  Administered 2017-06-19: 3 [IU] via SUBCUTANEOUS
  Administered 2017-06-20: 1 [IU] via SUBCUTANEOUS

## 2017-06-14 MED ORDER — DIAZEPAM 2 MG PO TABS
2.0000 mg | ORAL_TABLET | Freq: Every evening | ORAL | Status: DC | PRN
  Administered 2017-06-14: 2 mg via ORAL
  Filled 2017-06-14: qty 1

## 2017-06-14 NOTE — Plan of Care (Signed)
Pt uses call light appropriately and was instructed to use the white phone and call RN/NT using numbers on board and pt verbalized understanding. HR remains 100-130's and MD is aware but B/P ok and pt is asymptomatic for now, will continue to monitor.

## 2017-06-14 NOTE — Progress Notes (Signed)
MEDICATION RELATED NOTE  Pharmacy Re:  Verapamil/Rivaroxaban Indication: Drug/Drug Interaction   Drug-Drug Interaction Report  Rivaroxaban / Moderate CYP3A4 Inhibitors   Significance: Moderate   Warning: Plasma concentrations and pharmacologic effects of Rivaroxaban may be increased by verapamil, dilTIAZem HCl-Dextrose especially in patients with renal impairment.  Onset: DelayedDocumentation Level: Suspected  Interacting Medications/Orders: Rivaroxaban Oral, Systemic Moderate CYP3A4 Inhibitors Oral or Non-Oral, Systemic  1. Rivaroxaban  Order ([id number]): Rivaroxaban (XARELTO) tablet 15 mg Route: Oral Start: 06/12/2017 1700 End: none Frequency: Daily with supper 1. verapamil  Order ([id number]): verapamil (CALAN) tablet 40 mg Route: Oral Start: 06/14/2017 0845 End: none Frequency: Every 8 hours 2. dilTIAZem HCl-Dextrose  Order ([id number]): diltiazem (CARDIZEM) 100 mg in dextrose 5% 138mL (1 mg/mL) infusion Route: Intravenous Start: 06/12/2017 0330 End: 06/14/2017 0840 Frequency: Continuous   Management Code: Professional review suggested  Effects: Plasma concentrations and pharmacologic effects of Rivaroxaban may be increased by verapamil, dilTIAZem HCl-Dextrose especially in patients with renal impairment.  Mechanism: Inhibition of CYP3A4 and P-glycoprotein by verapamil, dilTIAZem HCl-Dextrose may decrease the metabolic elimination of Rivaroxaban and increase the bioavailability of Rivaroxaban.  Management: Routine clinical monitoring is indicated. Use alternatives to verapamil, dilTIAZem HCl-Dextrose in patients receiving Rivaroxaban that have renal insufficiency (i.e. CrCl 15 to 80 ml/min) unless the potential benefit outweighs the potential risk.    Plan:  - Closer monitoring for this patient with renal insufficiency is waranted.  Rober Minion, PharmD., MS Clinical Pharmacist Pager:  [phone] Thank you for allowing pharmacy to be part of this patients care  team. 06/14/2017,9:22 AM

## 2017-06-14 NOTE — Consult Note (Signed)
Reason for Consult:atrial fib with tachy-brady syndrome  Referring Physician: Dr. Nolene Bernheim [name] is an 83 y.o. female.   HPI: The patient is an 83 yo very frail woman who has developed atrial fib in the setting of ILBBB and chronic lung disease, ?MAI, who has had multiple admits with atrial fib and a slow VR as well as a RVR. She was transferred from [facility] with the above problems. She has not had syncope recently. She has dyspnea which is multifactorial. She denies anginal symptoms.   PMH: Past Medical History:  Diagnosis Date  . Anemia   . Atrial fibrillation ([place])   . 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 Chronic abdominal pain   . Chronic neck and back pain   . COPD (chronic obstructive pulmonary disease) ([place])   . GAD (generalized anxiety disorder)   . Gastroesophageal reflux disease   . Gastroparesis   . IBS (irritable bowel syndrome)   . Migraines   . Osteoporosis   . 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 Renal insufficiency   . S/P colostomy ([place])   . Tachycardia-bradycardia syndrome ([place])     PSHX: Past Surgical History:  Procedure Laterality Date  . ABDOMINAL HYSTERECTOMY    . APPENDECTOMY    . CHOLECYSTECTOMY    . COLOSTOMY    . ESOPHAGOGASTRODUODENOSCOPY    . ESOPHAGOGASTRODUODENOSCOPY N/A 08/25/2015   Procedure: ESOPHAGOGASTRODUODENOSCOPY (EGD);  Surgeon: Rogene Houston, MD;  Location: [facility] END[facility];  Service: Endoscopy;  Laterality: N/A;  730  . ESOPHAGOGASTRODUODENOSCOPY N/A 06/03/2017   Procedure: ESOPHAGOGASTRODUODENOSCOPY (EGD);  Surgeon: Rogene Houston, MD;  Location: [facility];  Service: Endoscopy;  Laterality: N/A;  . FOREIGN BODY REMOVAL  06/03/2017   Procedure: FOREIGN BODY REMOVAL;  Surgeon: Rogene Houston, MD;  Location: [facility];  Service: Endoscopy;;  . ILEOSCOPY N/A 08/25/2015   Procedure: ILEOSCOPY THROUGH STOMA;  Surgeon: Rogene Houston, MD;  Location: [facility];  Service: Endoscopy;  Laterality: N/A;  . PARTIAL GASTRECTOMY   1967   3/4 gastric resection for bleeding ulcers  . TONSILLECTOMY      FAMHX: Family History  Problem Relation Age of Onset  . Stroke Mother   . Stroke Father   . Diabetes Sister   . Diabetes Brother   . Stroke Brother   . Cancer Sister        unknown type  . Diabetes Sister   . Emphysema Sister   . Diabetes Brother   . Stroke Brother   . Diabetes Son   . Irritable bowel syndrome Son   . Diabetes Son   . Hypertension Son     Social History:  reports that  has never smoked. she has never used smokeless tobacco. She reports that she does not drink alcohol or use drugs.  Allergies:  Allergies  Allergen Reactions  . Levaquin [Levofloxacin] Other (See Comments)    Patient states that she became weak and extremely tired. Also, it made her real dizzy.  . Aspirin Nausea And Vomiting    Nausea/vomiting blood  . Tetanus Toxoid Hives and Swelling  . Zolpidem Tartrate     Not in right state of mind  . Fosamax [Alendronate Sodium] Other (See Comments)    Severe gastritis reflux.    Medications: I have reviewed the patient's current medications.  No results found.  ROS  As stated in the HPI and negative for all other systems.  Physical Exam  Vitals:Blood pressure (!) 91/49, pulse (!) 116,  temperature 98.6 F (37 C), temperature source Oral, resp. rate 16, height 4\' 11"  (1.499 m), weight 77 lb 6.1 oz (35.1 kg), SpO2 98 %.  frail appearing elderly woman, NAD HEENT: Unremarkable Neck:  6 cm JVD, no thyromegally Lymphatics:  No adenopathy Back:  No CVA tenderness Lungs:  Clear, with no wheezes HEART:  IRegular rate rhythm, no murmurs, no rubs, no clicks Abd:  Flat, scaphoid, positive bowel sounds, no organomegally, no rebound, no guarding Ext:  2 plus pulses, no edema, no cyanosis, no clubbing Skin:  No rashes no nodules Neuro:  CN II through XII intact, motor grossly intact  Tele - atrial fib with a RVR  Labs/echo/cxr all reviewed directly  Assessment/Plan: 1.  Atrial fib with a RVR and slow VR with long pauses - At this point and despite her poor long term prognosis, PPM insertion appears to be indicated. I will try and wean her off of her IV cardizem and switch to oral verapamil along with her beta blocker. If her rates are controlled then she might avoid a PPM. If not or if she goes too slow, then PPM would be placed on Monday.  2. Chronic lung disease - she is currently not coughing. No fever or chills. Will follow. cxr demonstrates no extra fluid. Cavities noted.  Carleene Overlie TaylorMD 06/14/2017, 8:41 AM

## 2017-06-14 NOTE — Telephone Encounter (Signed)
6 refills on each 

## 2017-06-14 NOTE — Progress Notes (Signed)
PROGRESS NOTE    [name]  RSW:[id number] DOB: 04/04/1929 DOA: 06/12/2017 PCP: Kathyrn Drown, MD     Brief Narrative:  83 year old woman admitted from home on 11/29 due to shortness of breath and weakness.  She was recently discharged due to tachybradycardia syndrome.  During her prior hospitalization there was difficulty adjusting her rate medications due to long pauses of up to 5 seconds when doses were increased, followed by rapid rates when doses were decreased.  Cardiology was involved at that time and decision was finally made to discharge her on metoprolol 12.5 twice daily and Cardizem CD 360 mg.  She was found to again be in A. fib/flutter with rates in the 140s-160s.  Cardiology was consulted and recommended transfer to [facility] to be evaluated by the EP service.     Assessment & Plan:   Principal Problem:   Tachy-brady syndrome ([place]) Active Problems:   Gastroparesis   Osteoporosis   Ileostomy in place [facility])   Chronic respiratory failure with hypoxia (HCC)   Tachycardia   Atrial fibrillation/flutter with RVR - She first presented to [facility] with A. fib with RVR in the 140s-160s and difficult to control heart rates which was limited by associated tachycardia-bradycardia syndrome with pauses up to 3-5 seconds on her recent admission that limited escalating doses of AV nodal blockers. - She was on IV diltiazem 15 mg/h, metoprolol 12.5 MG twice a day and was transferred to [facility] for evaluation by EP cardiology. - She remains on Xarelto for stroke prophylaxis. - EP cardiology input appreciated. Discussed with Dr. Lovena Le. Weaning off IV Cardizem, switching to oral verapamil along with metoprolol and monitoring over weekend. If her heart rates are controlled then may avoid PPM. If not or if she goes too slow, then plan for PPM placement on 12/3.  Chronic hypoxemic failure -Uses 2 L of oxygen chronically. -CT scan of the chest dated 11/17 was found  to have findings that could have been consistent with MAC. -  Dr. Jerilee Hoh, colleague hospitalist personally discussed via phone with Dr. Linus Salmons, infectious diseases on 11/29.  He stated no need for treatment.  He would obtain AFB sputums but there is no need for respiratory isolation. -No need seen for pulmonary evaluation at present, if situation changes could have pulmonary at [facility] assess her.  Gastroparesis/chronic pancreatitis -Continue Reglan and Creon. Stable.  Severe protein calorie malnutrition/Body mass index is 15.63 kg/m. - Consulted dietitian   Hypothyroid - Continue Synthroid.  Adult failure to thrive - Multifactorial due to advanced age and multiple complex medical conditions.? Palliative care consultation for goals.  Acute on stage 2-3 chronic kidney disease - Creatinine on 11/19:1.01. Creatinine currently 1.3. Follow BMP.  Hypomagnesemia Magnesium 1.3 on 11/29 and received IV magnesium supplements. Follow magnesium in a.m.  Type II DM A1c on 11/12:6.9. SSI.   DVT prophylaxis: Xarelto   Code Status: Full code Family Communication: Discussed with patient's niece and caregiver at bedside. Disposition Plan: DC home when medically improved.   Consultants:   Cardiology  ID via phone  Procedures:   None  Antimicrobials:  Anti-infectives (From admission, onward)   None       Subjective: Denies complaints. No chest pain, palpitations, dyspnea, dizziness or lightheadedness. Getting ready to eat breakfast this morning.   Objective: Vitals:   06/14/17 0500 06/14/17 0814 06/14/17 0910 06/14/17 1104  BP: (!) 92/52 (!) 91/49 121/82 122/81  Pulse: 98 (!) 116 94 99  Resp:  16  16  Temp: 97.6 F (36.4 C) 98.6 F (37 C)  98 F (36.7 C)  TempSrc: Oral Oral  Axillary  SpO2: 98% 98% 98% 97%  Weight: 35.1 kg (77 lb 6.1 oz)     Height:        Intake/Output Summary (Last 24 hours) at 06/14/2017 1556 Last data filed at 06/14/2017 0355 Gross per 24 hour   Intake 240 ml  Output 375 ml  Net -135 ml   Filed Weights   06/13/17 0500 06/13/17 1647 06/14/17 0500  Weight: 34.7 kg (76 lb 8 oz) 35 kg (77 lb 2.6 oz) 35.1 kg (77 lb 6.1 oz)    Examination:  General exam:  elderly female, small built, cachectic, chronically ill-looking, sitting up in bed without distress.  Respiratory system: Harsh breath sounds bilaterally with scattered few basilar dry crackles. No wheezing or rhonchi. No increased work of breathing Cardiovascular system: S1 and S2 heard, RRR. No JVD, murmurs or pedal edema. Telemetry: A. fib with controlled ventricular rate in the 90s despite morning but had periods of RVR in the 130s overnight.  Gastrointestinal system: Abdomen is nondistended, soft and nontender. No organomegaly or masses felt. Normal bowel sounds heard. Colostomy draining liquid green stools.  Central nervous system: Alert and oriented. No focal neurological deficits. Extremities: No C/C/E, +pedal pulses Skin: No rashes, lesions or ulcers Psychiatry: Judgement and insight appear normal. Mood & affect appropriate.     Data Reviewed: I have personally reviewed following labs and imaging studies  CBC: Recent Labs  Lab 06/12/17 0347 06/13/17 0447  WBC 15.4* 16.2*  NEUTROABS 12.1*  --   HGB 12.2 11.7*  HCT 38.9 37.2  MCV 99.0 97.9  PLT 369 974   Basic Metabolic Panel: Recent Labs  Lab 06/12/17 0347 06/13/17 0447  NA 136 136  K 4.1 3.6  CL 102 92*  CO2 26 33*  GLUCOSE 218* 161*  BUN 18 19  CREATININE 1.33* 1.31*  CALCIUM 8.4* 8.6*  MG 1.3*  --   PHOS 3.0  --    GFR: Estimated Creatinine Clearance: 17.1 mL/min (A) (by C-G formula based on SCr of 1.31 mg/dL (H)). Liver Function Tests: Recent Labs  Lab 06/12/17 0347  AST 21  ALT 10*  ALKPHOS 120  BILITOT 0.6  PROT 7.4  ALBUMIN 3.3*   No results for input(s): LIPASE, AMYLASE in the last 168 hours. No results for input(s): AMMONIA in the last 168 hours. Coagulation Profile: No  results for input(s): INR, PROTIME in the last 168 hours. Cardiac Enzymes: Recent Labs  Lab 06/12/17 0347 06/12/17 0816  TROPONINI <0.03 <0.03   BNP (last 3 results) No results for input(s): PROBNP in the last 8760 hours. HbA1C: No results for input(s): HGBA1C in the last 72 hours. CBG: Recent Labs  Lab 06/13/17 2132  GLUCAP 140*   Lipid Profile: No results for input(s): CHOL, HDL, LDLCALC, TRIG, CHOLHDL, LDLDIRECT in the last 72 hours. Thyroid Function Tests: Recent Labs    06/12/17 0827  TSH 1.955       Radiology Studies: No results found.      Scheduled Meds: . citalopram  10 mg Oral Daily  . levothyroxine  25 mcg Oral QAC breakfast  . lipase/protease/amylase  12,000 Units Oral TID AC  . magnesium oxide  200 mg Oral Daily  . mouth rinse  15 mL Mouth Rinse BID  . memantine  10 mg Oral BID  . metoCLOPramide  5 mg Oral TID AC & HS  .  metoprolol tartrate  12.5 mg Oral BID  . Rivaroxaban  15 mg Oral Q supper  . sodium chloride flush  3 mL Intravenous Q12H  . venlafaxine XR  37.5 mg Oral Q breakfast  . verapamil  40 mg Oral Q8H   Continuous Infusions: . sodium chloride       LOS: 2 days    Vernell Leep, MD, FACP, [facility]. [facility] Pager [phone]  If 7PM-7AM, please contact night-coverage [email or web address] Password [email or web address] 06/14/2017, 4:09 PM

## 2017-06-15 DIAGNOSIS — N179 Acute kidney failure, unspecified: Secondary | ICD-10-CM

## 2017-06-15 DIAGNOSIS — R627 Adult failure to thrive: Secondary | ICD-10-CM

## 2017-06-15 DIAGNOSIS — E43 Unspecified severe protein-calorie malnutrition: Secondary | ICD-10-CM

## 2017-06-15 DIAGNOSIS — N189 Chronic kidney disease, unspecified: Secondary | ICD-10-CM

## 2017-06-15 LAB — GLUCOSE, CAPILLARY
GLUCOSE-CAPILLARY: 223 mg/dL — AB (ref 65–99)
Glucose-Capillary: 141 mg/dL — ABNORMAL HIGH (ref 65–99)
Glucose-Capillary: 197 mg/dL — ABNORMAL HIGH (ref 65–99)
Glucose-Capillary: 105 mg/dL — ABNORMAL HIGH (ref 65–99)

## 2017-06-15 LAB — BASIC METABOLIC PANEL
Anion gap: 7 (ref 5–15)
BUN: 31 mg/dL — AB (ref 6–20)
CALCIUM: 9.1 mg/dL (ref 8.9–10.3)
CO2: 31 mmol/L (ref 22–32)
CREATININE: 1.45 mg/dL — AB (ref 0.44–1.00)
Chloride: 98 mmol/L — ABNORMAL LOW (ref 101–111)
GFR calc Af Amer: 37 mL/min — ABNORMAL LOW (ref >60)
GFR, EST NON AFRICAN AMERICAN: 32 mL/min — AB (ref >60)
GLUCOSE: 128 mg/dL — AB (ref 65–99)
POTASSIUM: 3.8 mmol/L (ref 3.5–5.1)
SODIUM: 136 mmol/L (ref 135–145)

## 2017-06-15 LAB — MAGNESIUM: MAGNESIUM: 1.6 mg/dL — AB (ref 1.7–2.4)

## 2017-06-15 LAB — CBC
HCT: 39.1 % (ref 36.0–46.0)
Hemoglobin: 12.3 g/dL (ref 12.0–15.0)
MCH: 30.8 pg (ref 26.0–34.0)
MCHC: 31.5 g/dL (ref 30.0–36.0)
MCV: 98 fL (ref 78.0–100.0)
PLATELETS: 394 10*3/uL (ref 150–400)
RBC: 3.99 MIL/uL (ref 3.87–5.11)
RDW: 14.4 % (ref 11.5–15.5)
WBC: 13.5 10*3/uL — ABNORMAL HIGH (ref 4.0–10.5)

## 2017-06-15 MED ORDER — SODIUM CHLORIDE 0.9 % IV SOLN
INTRAVENOUS | Status: AC
  Administered 2017-06-15: 09:00:00 via INTRAVENOUS

## 2017-06-15 MED ORDER — VERAPAMIL HCL 120 MG PO TABS
60.0000 mg | ORAL_TABLET | Freq: Three times a day (TID) | ORAL | Status: DC
  Administered 2017-06-15 – 2017-06-17 (×6): 60 mg via ORAL
  Filled 2017-06-15 (×7): qty 0.5

## 2017-06-15 MED ORDER — MAGNESIUM SULFATE IN D5W 1-5 GM/100ML-% IV SOLN
1.0000 g | Freq: Once | INTRAVENOUS | Status: AC
  Administered 2017-06-15: 1 g via INTRAVENOUS
  Filled 2017-06-15: qty 100

## 2017-06-15 MED ORDER — DIAZEPAM 2 MG PO TABS
2.0000 mg | ORAL_TABLET | Freq: Every day | ORAL | Status: DC
  Administered 2017-06-15 – 2017-06-19 (×5): 2 mg via ORAL
  Filled 2017-06-15 (×5): qty 1

## 2017-06-15 MED ORDER — METOPROLOL TARTRATE 12.5 MG HALF TABLET
12.5000 mg | ORAL_TABLET | Freq: Three times a day (TID) | ORAL | Status: DC
  Administered 2017-06-15 – 2017-06-16 (×3): 12.5 mg via ORAL
  Filled 2017-06-15 (×3): qty 1

## 2017-06-15 NOTE — Progress Notes (Signed)
Progress Note  Patient Name: [name] Date of Encounter: 06/15/2017  Primary Cardiologist: Domenic Polite  Subjective   Denies any problems with chest pain or shortness of breath. Ventricular rate at rest is in the 115/125 range most of the time, rarely dipping below 100 bpm. No additional problems with pauses.  Inpatient Medications    Scheduled Meds: . citalopram  10 mg Oral Daily  . diazepam  2 mg Oral QHS  . insulin aspart  0-9 Units Subcutaneous TID WC  . levothyroxine  25 mcg Oral QAC breakfast  . lipase/protease/amylase  12,000 Units Oral TID AC  . magnesium oxide  200 mg Oral Daily  . mouth rinse  15 mL Mouth Rinse BID  . memantine  10 mg Oral BID  . metoCLOPramide  5 mg Oral TID AC & HS  . metoprolol tartrate  12.5 mg Oral TID  . Rivaroxaban  15 mg Oral Q supper  . sodium chloride flush  3 mL Intravenous Q12H  . venlafaxine XR  37.5 mg Oral Q breakfast  . verapamil  40 mg Oral Q8H   Continuous Infusions: . sodium chloride    . sodium chloride 50 mL/hr at 06/15/17 0909  . magnesium sulfate 1 - 4 g bolus IVPB     PRN Meds: sodium chloride, acetaminophen **OR** acetaminophen, acetaminophen, ondansetron **OR** ondansetron (ZOFRAN) IV, senna-docusate, sodium chloride flush   Vital Signs    Vitals:   06/15/17 0558 06/15/17 0635 06/15/17 0753 06/15/17 1129  BP: 104/61 116/86 130/85 126/84  Pulse: (!) 107  (!) 115 (!) 109  Resp: 14  14 14   Temp: 98.5 F (36.9 C)  98.1 F (36.7 C) 98.1 F (36.7 C)  TempSrc: Oral  Oral Axillary  SpO2: 98%  100% 97%  Weight: 73 lb 3.2 oz (33.2 kg)     Height:        Intake/Output Summary (Last 24 hours) at 06/15/2017 1238 Last data filed at 06/15/2017 0600 Gross per 24 hour  Intake 480 ml  Output 900 ml  Net -420 ml   Filed Weights   06/13/17 1647 06/14/17 0500 06/15/17 0558  Weight: 77 lb 2.6 oz (35 kg) 77 lb 6.1 oz (35.1 kg) 73 lb 3.2 oz (33.2 kg)    Telemetry    Fibrillation rapid ventricular response -  Personally Reviewed  ECG    No new tracing - Personally Reviewed  Physical Exam  Appears extremely frail very slender, elderly GEN: No acute distress.   Neck: at most 4-5 centimeters JVD Cardiac: irregular, no murmurs, rubs, or gallops.  Respiratory: Clear to auscultation bilaterally. GI: Soft, nontender, non-distended  MS: No edema; No deformity. Neuro:  Nonfocal  Psych: Normal affect   Labs    Chemistry Recent Labs  Lab 06/12/17 0347 06/13/17 0447 06/15/17 0359  NA 136 136 136  K 4.1 3.6 3.8  CL 102 92* 98*  CO2 26 33* 31  GLUCOSE 218* 161* 128*  BUN 18 19 31*  CREATININE 1.33* 1.31* 1.45*  CALCIUM 8.4* 8.6* 9.1  PROT 7.4  --   --   ALBUMIN 3.3*  --   --   AST 21  --   --   ALT 10*  --   --   ALKPHOS 120  --   --   BILITOT 0.6  --   --   GFRNONAA 35* 36* 32*  GFRAA 41* 41* 37*  ANIONGAP 8 11 7      Hematology Recent Labs  Lab 06/12/17  9242 06/13/17 0447 06/15/17 0359  WBC 15.4* 16.2* 13.5*  RBC 3.93 3.80* 3.99  HGB 12.2 11.7* 12.3  HCT 38.9 37.2 39.1  MCV 99.0 97.9 98.0  MCH 31.0 30.8 30.8  MCHC 31.4 31.5 31.5  RDW 14.7 14.2 14.4  PLT 369 352 394    Cardiac Enzymes Recent Labs  Lab 06/12/17 0347 06/12/17 0816  TROPONINI <0.03 <0.03   No results for input(s): TROPIPOC in the last 168 hours.   BNP Recent Labs  Lab 06/12/17 0347  BNP 1,758.0*     DDimer No results for input(s): DDIMER in the last 168 hours.   Radiology    No results found.  Cardiac Studies   Echocardiogram 05/27/2017 Study Conclusions  - Left ventricle: The cavity size was normal. Wall thickness was   increased in a pattern of mild LVH. Systolic function was normal.   The estimated ejection fraction was in the range of 55% to 60%.   Wall motion was normal; there were no regional wall motion   abnormalities. The study was not technically sufficient to allow   evaluation of LV diastolic dysfunction due to atrial   fibrillation. Doppler parameters are consistent  with high   ventricular filling pressure. - Aortic valve: Trileaflet; mildly thickened, mildly calcified   leaflets. There was mild stenosis. There was mild regurgitation. - Mitral valve: Moderately calcified annulus. Mildly thickened   leaflets . There was mild regurgitation. - Left atrium: The atrium was mildly to moderately dilated. - Atrial septum: No defect or patent foramen ovale was identified. - Tricuspid valve: Mildly thickened leaflets. There was moderate   regurgitation. - Pulmonary arteries: PA peak pressure: 49 mm Hg (S).  Patient Profile     83 y.o. female with tachycardia due to atrial fibrillation rapid ventricular response, but also with episodes of transient AV block with pauses of up to 5 seconds.  Assessment & Plan    Due to overall poor clinical and functional status, Dr. Lovena Le felt it indicated to avoid the pacemaker if possible. At this point no further pauses have been recorded, but ventricular rate control remains poor. We'll slightly increase her dose of verapamil to 60 mg 3 times a day. Will leave decision to EP service tomorrow regarding pacemaker implantation.  For questions or updates, please contact [place] Please consult [email or web address] for contact info under Cardiology/STEMI.      Signed, Sanda Klein, MD  06/15/2017, 12:38 PM

## 2017-06-15 NOTE — Plan of Care (Signed)
Pt remains stable condition but HR still A-Fib in the 110-140's depending on what pt's activity is. Pt started on NS at 50cc/hr and received 1 GM Magnesium for a level of 1.6, will continue to monitor. Pt denies pain and most of her care is done by her [place].

## 2017-06-15 NOTE — Evaluation (Signed)
Physical Therapy Evaluation Patient Details Name: [name] MRN: [id number] DOB: 03-19-1929 Today's Date: 06/15/2017   History of Present Illness  Pt is an 83 y.o. female admitted with tachy-brady syndrome. Pt had recent admission for same dx. PMH consists of anemia, cavitary lung disease, lung mass, and s/p colostomy.   Clinical Impression  Pt admitted with above diagnosis. Pt currently with functional limitations due to the deficits listed below (see PT Problem List). On eval, pt required min guard assist transfers and ambulation 25 feet HHA of 1. Pt provided with extended oxygen tubing and on 3 L O2 throughout session. Mobility limited by tachycardia. HR 119 at rest in bed, 137 upon sitting EOB, and ranged 125-161 erratically during ambulation. Pt returned to supine in bed at end of session.  Pt will benefit from skilled PT to increase their independence and safety with mobility to allow discharge to the venue listed below.       Follow Up Recommendations Home health PT;Supervision/Assistance - 24 hour;Supervision for mobility/OOB    Equipment Recommendations  None recommended by PT    Recommendations for Other Services       Precautions / Restrictions Precautions Precautions: Fall;Other (comment) Precaution Comments: watch HR      Mobility  Bed Mobility Overal bed mobility: Modified Independent                Transfers Overall transfer level: Needs assistance Equipment used: Ambulation equipment used Transfers: Sit to/from Stand;Stand Pivot Transfers Sit to Stand: Min guard Stand pivot transfers: Min guard          Ambulation/Gait Ambulation/Gait assistance: Min guard Ambulation Distance (Feet): 25 Feet Assistive device: 1 person hand held assist Gait Pattern/deviations: Step-through pattern;Decreased stride length   Gait velocity interpretation: <1.8 ft/sec, indicative of risk for recurrent falls General Gait Details: HR varied widely during mobility,  ranging from 119 at rest to 125-161 during in room ambulation.  Pt on 3 L O2 throughout session.  Stairs            Wheelchair Mobility    Modified Rankin (Stroke Patients Only)       Balance Overall balance assessment: Needs assistance Sitting-balance support: No upper extremity supported;Feet supported Sitting balance-Leahy Scale: Good     Standing balance support: Single extremity supported;During functional activity Standing balance-Leahy Scale: Fair                               Pertinent Vitals/Pain Pain Assessment: No/denies pain    Home Living Family/patient expects to be discharged to:: Private residence Living Arrangements: Spouse/significant other Available Help at Discharge: Family;Personal care attendant;Available 24 hours/day Type of Home: House Home Access: Stairs to enter Entrance Stairs-Rails: Chemical engineer of Steps: 5 Home Layout: One level;Laundry or work area in [place]: Environmental consultant - 4 wheels;Wheelchair - manual;Grab bars - tub/shower;Grab bars - toilet      Prior Function Level of Independence: Needs assistance   Gait / Transfers Assistance Needed: ambulates household distances with rollator  ADL's / Homemaking Assistance Needed: Caregivers provide mod A for dressing and bathing, min A for toiletting and colostomy management; Max-total assist with meals and housework. Pt feeds self independently.  Comments: Pt on 2 L O2 continuously at home.     Hand Dominance   Dominant Hand: Right    Extremity/Trunk Assessment   Upper Extremity Assessment Upper Extremity Assessment: Generalized weakness    Lower Extremity Assessment Lower  Extremity Assessment: Generalized weakness    Cervical / Trunk Assessment Cervical / Trunk Assessment: Kyphotic  Communication   Communication: No difficulties  Cognition Arousal/Alertness: Awake/alert Behavior During Therapy: WFL for tasks  assessed/performed Overall Cognitive Status: Within Functional Limits for tasks assessed                                        General Comments      Exercises     Assessment/Plan    PT Assessment Patient needs continued PT services  PT Problem List Decreased strength;Cardiopulmonary status limiting activity;Decreased balance;Decreased activity tolerance;Decreased mobility       PT Treatment Interventions Balance training;Gait training;Stair training;Functional mobility training;Therapeutic activities;Therapeutic exercise;Patient/family education    PT Goals (Current goals can be found in the Care Plan section)  Acute Rehab PT Goals Patient Stated Goal: get stronger PT Goal Formulation: With patient Time For Goal Achievement: 06/29/17 Potential to Achieve Goals: Fair    Frequency Min 3X/week   Barriers to discharge        Co-evaluation               AM-PAC PT "6 Clicks" Daily Activity  Outcome Measure Difficulty turning over in bed (including adjusting bedclothes, sheets and blankets)?: None Difficulty moving from lying on back to sitting on the side of the bed? : None Difficulty sitting down on and standing up from a chair with arms (e.g., wheelchair, bedside commode, etc,.)?: A Little Help needed moving to and from a bed to chair (including a wheelchair)?: A Little Help needed walking in hospital room?: A Little Help needed climbing 3-5 steps with a railing? : A Lot 6 Click Score: 19    End of Session Equipment Utilized During Treatment: Gait belt;Oxygen Activity Tolerance: Treatment limited secondary to medical complications (Comment)(tachy) Patient left: in bed;with call bell/phone within reach;with family/visitor present Nurse Communication: Mobility status PT Visit Diagnosis: Unsteadiness on feet (R26.81);Difficulty in walking, not elsewhere classified (R26.2);Muscle weakness (generalized) (M62.81)    Time: 5885-0277 PT Time  Calculation (min) (ACUTE ONLY): 14 min   Charges:   PT Evaluation $PT Eval Low Complexity: 1 Low     PT G Codes:        Lorrin Goodell, PT  Office # [phone] Pager [phone]   Lorriane Shire 06/15/2017, 3:10 PM

## 2017-06-15 NOTE — Progress Notes (Signed)
PROGRESS NOTE    [name]  EXH:[id number] DOB: March 07, 1929 DOA: 06/12/2017 PCP: Kathyrn Drown, MD     Brief Narrative:  83 year old woman admitted from home on 11/29 due to shortness of breath and weakness.  She was recently discharged due to tachybradycardia syndrome.  During her prior hospitalization there was difficulty adjusting her rate medications due to long pauses of up to 5 seconds when doses were increased, followed by rapid rates when doses were decreased.  Cardiology was involved at that time and decision was finally made to discharge her on metoprolol 12.5 twice daily and Cardizem CD 360 mg.  She was found to again be in A. fib/flutter with rates in the 140s-160s.  Cardiology was consulted and recommended transfer to [facility] to be evaluated by the EP service.     Assessment & Plan:   Principal Problem:   Tachy-brady syndrome ([place]) Active Problems:   Gastroparesis   Osteoporosis   Ileostomy in place [facility])   Chronic respiratory failure with hypoxia (HCC)   Tachycardia   Atrial fibrillation/flutter with RVR - She first presented to [facility] with A. fib with RVR in the 140s-160s and difficult to control heart rates which was limited by associated tachycardia-bradycardia syndrome with pauses up to 3-5 seconds on her recent admission that limited escalating doses of AV nodal blockers. - She was on IV diltiazem 15 mg/h, metoprolol 12.5 MG twice a day and was transferred to [facility] for evaluation by EP cardiology. - She remains on Xarelto for stroke prophylaxis. - EP cardiology input appreciated. Weaned off IV Cardizem and switched to oral verapamil along with metoprolol.  - Heart rate still not adequately controlled. As per cardiology follow-up, due to her very poor long-term prognosis, extremely frail size and malnourishment, would like to avoid a PPM. Titrating medications.  Chronic hypoxemic failure -Uses 2 L of oxygen chronically. -CT scan  of the chest dated 11/17 was found to have findings that could have been consistent with MAC. -  Dr. Jerilee Hoh, colleague hospitalist personally discussed via phone with Dr. Linus Salmons, infectious diseases on 11/29.  He stated no need for treatment.  He would obtain AFB sputums but there is no need for respiratory isolation. -No need seen for pulmonary evaluation at present, if situation changes could have pulmonary at [facility] assess her.  Gastroparesis/chronic pancreatitis -Continue Reglan and Creon. Stable.  Severe protein calorie malnutrition/Body mass index is 14.78 kg/m. - Consulted dietitian, input pending.   Hypothyroid - Continue Synthroid. TSH 1.955.  Adult failure to thrive - Multifactorial due to 83 and multiple complex medical conditions.? Palliative care consultation for goals. I discussed in detail with patient's niece/HCPOA who indicates that patient has moderate dementia, prior to 3 weeks ago was ambulating independently and was in usual state of health but has declined due to last 2 hospitalizations. Discussed overall poor long-term prognosis given 83, dementia, frail size and multiple significant comorbidities and to think regarding palliative care options. Will need to continue to have these discussions.  Acute on stage 2-3 chronic kidney disease - Creatinine on 11/19:1.01. Creatinine has gradually increased to 1.4.? Related to volume depletion. Brief and gentle IV fluids. Follow BMP in a.m.  Hypomagnesemia Magnesium 1.3 on 11/29 and received IV magnesium supplements. Magnesium 1.6. Replace and follow.  Type II DM A1c on 11/12:6.9. SSI.  Dementia without behavioral disturbance. Continue home medications.  Insomnia - As per family, patient does take Valium 2 MG daily at bedtime  and has been on it for a long time.  DVT prophylaxis: Xarelto   Code Status: Full code Family Communication: Discussed with patient's niece/HCPOA and caregiver at  bedside. Disposition Plan: DC home when medically improved.   Consultants:   Cardiology  ID via phone  Procedures:   None  Antimicrobials:  Anti-infectives (From admission, onward)   None       Subjective: Patient sleeping. Apparently did not sleep well last night. No active issues reported by family at bedside. No acute issues as per RN.  Objective: Vitals:   06/15/17 0558 06/15/17 0635 06/15/17 0753 06/15/17 1129  BP: 104/61 116/86 130/85 126/84  Pulse: (!) 107  (!) 115 (!) 109  Resp: _0 Temp: 98.5 F (36.9 C)  98.1 F (36.7 C) 98.1 F (36.7 C)  TempSrc: Oral  Oral Axillary  SpO2: 98%  100% 97%  Weight: 33.2 kg (73 lb 3.2 oz)     Height:        Intake/Output Summary (Last 24 hours) at 06/15/2017 1218 Last data filed at 06/15/2017 0600 Gross per 24 hour  Intake 480 ml  Output 900 ml  Net -420 ml   Filed Weights   06/13/17 1647 06/14/17 0500 06/15/17 0558  Weight: 35 kg (77 lb 2.6 oz) 35.1 kg (77 lb 6.1 oz) 33.2 kg (73 lb 3.2 oz)    Examination:  General exam:  elderly female, small built, cachectic, chronically ill-looking, sleeping comfortably in bed. Respiratory system: Harsh breath sounds bilaterally with scattered few basilar dry crackles. No wheezing or rhonchi. No increased work of breathing. Stable without change. Cardiovascular system: S1 and S2 heard, RRR. No JVD, murmurs or pedal edema. Telemetry personally reviewed: A. fib with ventricular rates frequently fluctuating in the 110s-130s. Gastrointestinal system: Abdomen is nondistended, soft and nontender. No organomegaly or masses felt. Normal bowel sounds heard. Colostomy draining liquid green stools. Stable without change. Central nervous system: Sleeping. No focal neurological deficits. Extremities: No C/C/E, +pedal pulses Skin: No rashes, lesions or ulcers Psychiatry: Judgement and insight impaired. Mood & affect appropriate.     Data Reviewed: I have personally reviewed following  labs and imaging studies  CBC: Recent Labs  Lab 06/12/17 0347 06/13/17 0447 06/15/17 0359  WBC 15.4* 16.2* 13.5*  NEUTROABS 12.1*  --   --   HGB 12.2 11.7* 12.3  HCT 38.9 37.2 39.1  MCV 99.0 97.9 98.0  PLT 369 352 546   Basic Metabolic Panel: Recent Labs  Lab 06/12/17 0347 06/13/17 0447 06/15/17 0359  NA 136 136 136  K 4.1 3.6 3.8  CL 102 92* 98*  CO2 26 33* 31  GLUCOSE 218* 161* 128*  BUN 18 19 31*  CREATININE 1.33* 1.31* 1.45*  CALCIUM 8.4* 8.6* 9.1  MG 1.3*  --  1.6*  PHOS 3.0  --   --    GFR: Estimated Creatinine Clearance: 14.6 mL/min (A) (by C-G formula based on SCr of 1.45 mg/dL (H)). Liver Function Tests: Recent Labs  Lab 06/12/17 0347  AST 21  ALT 10*  ALKPHOS 120  BILITOT 0.6  PROT 7.4  ALBUMIN 3.3*   Cardiac Enzymes: Recent Labs  Lab 06/12/17 0347 06/12/17 0816  TROPONINI <0.03 <0.03   CBG: Recent Labs  Lab 06/13/17 2132 06/14/17 1715 06/15/17 0831 06/15/17 1129  GLUCAP 140* 135* 141* 197*      Radiology Studies: No results found.      Scheduled Meds: . citalopram  10 mg Oral Daily  . insulin  aspart  0-9 Units Subcutaneous TID WC  . levothyroxine  25 mcg Oral QAC breakfast  . lipase/protease/amylase  12,000 Units Oral TID AC  . magnesium oxide  200 mg Oral Daily  . mouth rinse  15 mL Mouth Rinse BID  . memantine  10 mg Oral BID  . metoCLOPramide  5 mg Oral TID AC & HS  . metoprolol tartrate  12.5 mg Oral TID  . Rivaroxaban  15 mg Oral Q supper  . sodium chloride flush  3 mL Intravenous Q12H  . venlafaxine XR  37.5 mg Oral Q breakfast  . verapamil  40 mg Oral Q8H   Continuous Infusions: . sodium chloride    . sodium chloride 50 mL/hr at 06/15/17 0909     LOS: 3 days    Vernell Leep, MD, FACP, [facility]. [facility] Pager [phone]  If 7PM-7AM, please contact night-coverage [email or web address] Password [facility] 06/15/2017, 12:18 PM

## 2017-06-15 NOTE — Discharge Instructions (Addendum)

## 2017-06-15 NOTE — Progress Notes (Signed)
Progress Note  Patient Name: [name] Date of Encounter: 06/15/2017  Primary Cardiologist: Domenic Polite  Subjective   Not very talkative today. Does not complain of shortness of breath or chest pain.  Inpatient Medications    Scheduled Meds: . citalopram  10 mg Oral Daily  . insulin aspart  0-9 Units Subcutaneous TID WC  . levothyroxine  25 mcg Oral QAC breakfast  . lipase/protease/amylase  12,000 Units Oral TID AC  . magnesium oxide  200 mg Oral Daily  . mouth rinse  15 mL Mouth Rinse BID  . memantine  10 mg Oral BID  . metoCLOPramide  5 mg Oral TID AC & HS  . metoprolol tartrate  12.5 mg Oral BID  . Rivaroxaban  15 mg Oral Q supper  . sodium chloride flush  3 mL Intravenous Q12H  . venlafaxine XR  37.5 mg Oral Q breakfast  . verapamil  40 mg Oral Q8H   Continuous Infusions: . sodium chloride    . sodium chloride 50 mL/hr at 06/15/17 0909   PRN Meds: sodium chloride, acetaminophen **OR** acetaminophen, acetaminophen, diazepam, ondansetron **OR** ondansetron (ZOFRAN) IV, senna-docusate, sodium chloride flush   Vital Signs    Vitals:   06/15/17 0558 06/15/17 0635 06/15/17 0753 06/15/17 1129  BP: 104/61 116/86 130/85 126/84  Pulse: (!) 107  (!) 115 (!) 109  Resp: 14  14 14   Temp: 98.5 F (36.9 C)  98.1 F (36.7 C) 98.1 F (36.7 C)  TempSrc: Oral  Oral Axillary  SpO2: 98%  100% 97%  Weight: 73 lb 3.2 oz (33.2 kg)     Height:        Intake/Output Summary (Last 24 hours) at 06/15/2017 1154 Last data filed at 06/15/2017 0600 Gross per 24 hour  Intake 480 ml  Output 900 ml  Net -420 ml   Filed Weights   06/13/17 1647 06/14/17 0500 06/15/17 0558  Weight: 77 lb 2.6 oz (35 kg) 77 lb 6.1 oz (35.1 kg) 73 lb 3.2 oz (33.2 kg)    Telemetry    Atrial fib with a CVR/RVR - Personally Reviewed  ECG    none - Personally Reviewed  Physical Exam   GEN: frail, elderly woman, no acute distress.   Neck: 7 cm JVD Cardiac: IRIRR, no murmurs, rubs, or gallops.    Respiratory: Clear to auscultation bilaterally. GI: Soft, nontender, non-distended  MS: No edema; No deformity. Neuro:  Nonfocal  Psych: Normal affect   Labs    Chemistry Recent Labs  Lab 06/12/17 0347 06/13/17 0447 06/15/17 0359  NA 136 136 136  K 4.1 3.6 3.8  CL 102 92* 98*  CO2 26 33* 31  GLUCOSE 218* 161* 128*  BUN 18 19 31*  CREATININE 1.33* 1.31* 1.45*  CALCIUM 8.4* 8.6* 9.1  PROT 7.4  --   --   ALBUMIN 3.3*  --   --   AST 21  --   --   ALT 10*  --   --   ALKPHOS 120  --   --   BILITOT 0.6  --   --   GFRNONAA 35* 36* 32*  GFRAA 41* 41* 37*  ANIONGAP 8 11 7      Hematology Recent Labs  Lab 06/12/17 0347 06/13/17 0447 06/15/17 0359  WBC 15.4* 16.2* 13.5*  RBC 3.93 3.80* 3.99  HGB 12.2 11.7* 12.3  HCT 38.9 37.2 39.1  MCV 99.0 97.9 98.0  MCH 31.0 30.8 30.8  MCHC 31.4 31.5 31.5  RDW  14.7 14.2 14.4  PLT 369 352 394    Cardiac Enzymes Recent Labs  Lab 06/12/17 0347 06/12/17 0816  TROPONINI <0.03 <0.03   No results for input(s): TROPIPOC in the last 168 hours.   BNP Recent Labs  Lab 06/12/17 0347  BNP 1,758.0*     DDimer No results for input(s): DDIMER in the last 168 hours.   Radiology    No results found.  Cardiac Studies   none  Patient Profile     83 y.o. female admitted in transfer for uncontrolled atrial fib.   Assessment & Plan    1. Atrial fib - her rates are improved. Her frail size and malnourishment make her a very poor candidate for PPM insertion. We will hope to avoid this as much as possible. 2. Transient AV block - she has had none since she has been in the hospital. I plan to uptitrate her meds slightly and if her rates are stable, DC tomorrow. 3. Disp. - her long term prognosis is very poor and for this reason I would hope to avoid a PPM.  Joshuan Bolander,M.D.  For questions or updates, please contact [place] Please consult [email or web address] for contact info under Cardiology/STEMI.      Signed, Cristopher Peru,  MD  06/15/2017, 11:54 AM  Patient ID: [name], female   DOB: September 22, 1928, 83 y.o.   MRN: [id number]

## 2017-06-16 LAB — BASIC METABOLIC PANEL
Anion gap: 6 (ref 5–15)
BUN: 21 mg/dL — AB (ref 6–20)
CHLORIDE: 102 mmol/L (ref 101–111)
CO2: 30 mmol/L (ref 22–32)
Calcium: 8.7 mg/dL — ABNORMAL LOW (ref 8.9–10.3)
Creatinine, Ser: 1.19 mg/dL — ABNORMAL HIGH (ref 0.44–1.00)
GFR calc Af Amer: 47 mL/min — ABNORMAL LOW (ref >60)
GFR, EST NON AFRICAN AMERICAN: 40 mL/min — AB (ref >60)
GLUCOSE: 119 mg/dL — AB (ref 65–99)
POTASSIUM: 4 mmol/L (ref 3.5–5.1)
Sodium: 138 mmol/L (ref 135–145)

## 2017-06-16 LAB — GLUCOSE, CAPILLARY
GLUCOSE-CAPILLARY: 74 mg/dL (ref 65–99)
Glucose-Capillary: 130 mg/dL — ABNORMAL HIGH (ref 65–99)
Glucose-Capillary: 263 mg/dL — ABNORMAL HIGH (ref 65–99)
Glucose-Capillary: 167 mg/dL — ABNORMAL HIGH (ref 65–99)

## 2017-06-16 LAB — MAGNESIUM: Magnesium: 1.7 mg/dL (ref 1.7–2.4)

## 2017-06-16 MED ORDER — METOPROLOL TARTRATE 25 MG PO TABS
25.0000 mg | ORAL_TABLET | Freq: Two times a day (BID) | ORAL | Status: DC
  Administered 2017-06-16 – 2017-06-19 (×6): 25 mg via ORAL
  Filled 2017-06-16 (×6): qty 1

## 2017-06-16 MED ORDER — ADULT MULTIVITAMIN W/MINERALS CH
1.0000 | ORAL_TABLET | Freq: Every day | ORAL | Status: DC
  Administered 2017-06-16 – 2017-06-18 (×3): 1 via ORAL
  Filled 2017-06-16 (×3): qty 1

## 2017-06-16 MED ORDER — BENEPROTEIN PO POWD
1.0000 | Freq: Three times a day (TID) | ORAL | Status: DC
  Administered 2017-06-17 – 2017-06-20 (×8): 6 g via ORAL
  Filled 2017-06-16 (×2): qty 227

## 2017-06-16 MED ORDER — MAGNESIUM SULFATE 2 GM/50ML IV SOLN
2.0000 g | Freq: Once | INTRAVENOUS | Status: AC
  Administered 2017-06-16: 2 g via INTRAVENOUS
  Filled 2017-06-16: qty 50

## 2017-06-16 NOTE — Progress Notes (Signed)
Initial Nutrition Assessment  DOCUMENTATION CODES:   Severe malnutrition in context of chronic illness, Underweight  INTERVENTION:   -1 scoop Beneprotein TID between meals -MVI daily -Snacks TID  NUTRITION DIAGNOSIS:   Severe Malnutrition related to chronic illness(COPD, partial gastrectomy 2/2 bleeding ulcers) as evidenced by severe fat depletion, severe muscle depletion.  GOAL:   Patient will meet greater than or equal to 90% of their needs  MONITOR:   PO intake, Supplement acceptance, Labs, Diet advancement, Weight trends, Skin, I & O's  REASON FOR ASSESSMENT:   Consult Assessment of nutrition requirement/status  ASSESSMENT:   [name] is a 83 y.o. female who was recently discharged due to tachybradycardia syndrome who presents today with SOB and weakness.  Pt admitted with tachy-brady syndrome.   11/30- transferred [facility] to [facility] with pt and caregiver at bedside. Per pt, she has been very petite her entire life. She reports (as confirmed by her caregiver) that weight is usually stable around 75#. This statement has been consistent over the past 6 months, per wt hx.   Per pt caregiver, pt eats about every 2 hours throughout the day (about 5-6 times per day) due to pt being unable to tolerate large amounts of food. Meal consist of an egg and cheese biscuit or peanut butter biscuit, snacks consist of either ice cream, jello, or pudding with protein powder mixed in. Pt has tried supplements may times, however, has been unable to tolerate well ("thye run right through me"). Pt enjoys the [employer] provided on meal trays. Noted pt consumed about 75% of breakfast; meal completion 40-60%.   Discussed with pt and caregiver ways to increase oral intake.   Medication reviewed and include Creon and Megace.   Labs reviewed: CBGS: 105-223 (inpatient orders for 0-9 units insulin aspart TID).   NUTRITION - FOCUSED PHYSICAL EXAM:    Most Recent Value   Orbital Region  Severe depletion  Upper Arm Region  Severe depletion  Thoracic and Lumbar Region  Unable to assess  Buccal Region  Moderate depletion  Temple Region  Severe depletion  Clavicle Bone Region  Severe depletion  Clavicle and Acromion Bone Region  Severe depletion  Scapular Bone Region  Severe depletion  Dorsal Hand  Severe depletion  Patellar Region  Severe depletion  Anterior Thigh Region  Moderate depletion  Posterior Calf Region  Moderate depletion  Edema (RD Assessment)  Mild  Hair  Reviewed  Eyes  Reviewed  Mouth  Reviewed  Skin  Reviewed  Nails  Reviewed       Diet Order:  Diet heart healthy/carb modified Room service appropriate? Yes; Fluid consistency: Thin  EDUCATION NEEDS:   Education needs have been addressed  11Skin:  Skin Assessment: Reviewed RN Assessment  Last BM:  06/16/17 (via colostomy)  Height:   Ht Readings from Last 1 Encounters:  06/13/17 4\' 11"  (1.499 m)    Weight:   Wt Readings from Last 1 Encounters:  06/15/17 73 lb 3.2 oz (33.2 kg)    Ideal Body Weight:  44.5 kg  BMI:  Body mass index is 14.78 kg/m.  Estimated Nutritional Needs:   Kcal:  1100-1300  Protein:  45-60 grams  Fluid:  1.1-1.3 L    Zeniya Lapidus A. Jimmye Norman, RD, LDN, CDE Pager: [phone] After hours Pager: [phone]

## 2017-06-16 NOTE — Progress Notes (Signed)
Progress Note  Patient Name: [name] Date of Encounter: 06/16/2017  Primary Cardiologist: Domenic Polite  Subjective   Patient denies any active complaints this AM, mentioned a "spell" of SOB yesterday self resolved, no CP or sensation of palpitations  Inpatient Medications    Scheduled Meds: . citalopram  10 mg Oral Daily  . diazepam  2 mg Oral QHS  . insulin aspart  0-9 Units Subcutaneous TID WC  . levothyroxine  25 mcg Oral QAC breakfast  . lipase/protease/amylase  12,000 Units Oral TID AC  . magnesium oxide  200 mg Oral Daily  . mouth rinse  15 mL Mouth Rinse BID  . memantine  10 mg Oral BID  . metoCLOPramide  5 mg Oral TID AC & HS  . metoprolol tartrate  12.5 mg Oral TID  . Rivaroxaban  15 mg Oral Q supper  . sodium chloride flush  3 mL Intravenous Q12H  . venlafaxine XR  37.5 mg Oral Q breakfast  . verapamil  60 mg Oral Q8H   Continuous Infusions: . sodium chloride    . sodium chloride Stopped (06/16/17 0548)  . magnesium sulfate 1 - 4 g bolus IVPB     PRN Meds: sodium chloride, acetaminophen **OR** acetaminophen, acetaminophen, ondansetron **OR** ondansetron (ZOFRAN) IV, senna-docusate, sodium chloride flush   Vital Signs    Vitals:   06/15/17 2320 06/16/17 0034 06/16/17 0441 06/16/17 0547  BP: 115/86  (!) 148/97 (!) 148/97  Pulse: (!) 40  (!) 112   Resp:   17   Temp:  98 F (36.7 C) 98.6 F (37 C)   TempSrc:   Oral   SpO2: 97%  100%   Weight:      Height:        Intake/Output Summary (Last 24 hours) at 06/16/2017 0735 Last data filed at 06/16/2017 0442 Gross per 24 hour  Intake 1542.5 ml  Output 400 ml  Net 1142.5 ml   Filed Weights   06/13/17 1647 06/14/17 0500 06/15/17 0558  Weight: 77 lb 2.6 oz (35 kg) 77 lb 6.1 oz (35.1 kg) 73 lb 3.2 oz (33.2 kg)    Telemetry    AFib 100-130's - Personally Reviewed  ECG    No new tracing - Personally Reviewed  Physical Exam   Appears unchanged, body habitus is extremely frail very slender,  cachectic, elderly GEN: No acute distress.   Neck: at most 4-5 centimeters JVD Cardiac: irregular, no murmurs, rubs, or gallops.  Respiratory: crackles left base GI: Soft, nontender, non-distended  MS: No edema; No deformity. Neuro:  Nonfocal  Psych: Normal affect, pleasent and conversational  Labs    Chemistry Recent Labs  Lab 06/12/17 0347 06/13/17 0447 06/15/17 0359 06/16/17 0408  NA 136 136 136 138  K 4.1 3.6 3.8 4.0  CL 102 92* 98* 102  CO2 26 33* 31 30  GLUCOSE 218* 161* 128* 119*  BUN 18 19 31* 21*  CREATININE 1.33* 1.31* 1.45* 1.19*  CALCIUM 8.4* 8.6* 9.1 8.7*  PROT 7.4  --   --   --   ALBUMIN 3.3*  --   --   --   AST 21  --   --   --   ALT 10*  --   --   --   ALKPHOS 120  --   --   --   BILITOT 0.6  --   --   --   GFRNONAA 35* 36* 32* 40*  GFRAA 41* 41* 37* 47*  ANIONGAP 8 11 7 6      Hematology Recent Labs  Lab 06/12/17 0347 06/13/17 0447 06/15/17 0359  WBC 15.4* 16.2* 13.5*  RBC 3.93 3.80* 3.99  HGB 12.2 11.7* 12.3  HCT 38.9 37.2 39.1  MCV 99.0 97.9 98.0  MCH 31.0 30.8 30.8  MCHC 31.4 31.5 31.5  RDW 14.7 14.2 14.4  PLT 369 352 394    Cardiac Enzymes Recent Labs  Lab 06/12/17 0347 06/12/17 0816  TROPONINI <0.03 <0.03   No results for input(s): TROPIPOC in the last 168 hours.   BNP Recent Labs  Lab 06/12/17 0347  BNP 1,758.0*     DDimer No results for input(s): DDIMER in the last 168 hours.   Radiology    No results found.  Cardiac Studies   Echocardiogram 05/27/2017 Study Conclusions - Left ventricle: The cavity size was normal. Wall thickness was   increased in a pattern of mild LVH. Systolic function was normal.   The estimated ejection fraction was in the range of 55% to 60%.   Wall motion was normal; there were no regional wall motion   abnormalities. The study was not technically sufficient to allow   evaluation of LV diastolic dysfunction due to atrial   fibrillation. Doppler parameters are consistent with high    ventricular filling pressure. - Aortic valve: Trileaflet; mildly thickened, mildly calcified   leaflets. There was mild stenosis. There was mild regurgitation. - Mitral valve: Moderately calcified annulus. Mildly thickened   leaflets . There was mild regurgitation. - Left atrium: The atrium was mildly to moderately dilated. - Atrial septum: No defect or patent foramen ovale was identified. - Tricuspid valve: Mildly thickened leaflets. There was moderate   regurgitation. - Pulmonary arteries: PA peak pressure: 49 mm Hg (S).  Patient Profile     83 y.o. female with tachycardia due to atrial fibrillation rapid ventricular response, but also with episodes of transient AV block with pauses of up to 5 seconds.  Assessment & Plan     1. AFib (likeltpersistent/premanent)     CHA2DS2Vasc is 4, on Xarelto (appropriately dosed for clearance of 18)  2. Likely some degree of tachy-brady with reports of pauses     Given significant malnutrition/frail body habitus, overall poor prognosis, would prefer avoiding PPM if at all possible           On metoprolol 12.5mg  TID and Verapamil 60mg  TID (both up-titrate yesterday)  3. Chronic lung disease      Home O2  4. Chronic pancreatitis 5. Dementia      Has home CNA Significant functional decline over the last couple months reported by home CNA at bedsied with a couple hospitalizations Medicine team has begin discussions regarding palliative care with niece, no decisions as of yet     For questions or updates, please contact [facility] Please consult [email or web address] for contact info under Cardiology/STEMI.      Signed, Baldwin Jamaica, PA-C  06/16/2017, 7:35 AM    EP Attending  Patient seen and examined. Agree with above. The patient remains tachycardic and debilitated but bp still ok. Will try and uptitrate her beta blocker today. As she is likely going to be in the bed or chair for the rest of her life which I suspect to be short, no  PPM is an option.   Mikle Bosworth.D.

## 2017-06-16 NOTE — Care Management Important Message (Signed)
Important Message  Patient Details  Name: [name] MRN: [id number] Date of Birth: 09-Dec-1928   Medicare Important Message Given:  Yes    Stefanos Haynesworth Abena 06/16/2017, 11:00 AM

## 2017-06-16 NOTE — Progress Notes (Signed)
PROGRESS NOTE    [name]  TGG:[id number] DOB: 17-Sep-1928 DOA: 06/12/2017 PCP: Kathyrn Drown, MD     Brief Narrative:  83 year old woman admitted from home on 11/29 due to shortness of breath and weakness.  She was recently discharged due to tachybradycardia syndrome.  During her prior hospitalization there was difficulty adjusting her rate medications due to long pauses of up to 5 seconds when doses were increased, followed by rapid rates when doses were decreased.  Cardiology was involved at that time and decision was finally made to discharge her on metoprolol 12.5 twice daily and Cardizem CD 360 mg.  She was found to again be in A. fib/flutter with rates in the 140s-160s.  Cardiology was consulted and recommended transfer to [facility] to be evaluated by the EP service. EP Cardiology following and adjusting rate control medications. Not felt to be appropriate candidate for PPM.   Assessment & Plan:   Principal Problem:   Tachy-brady syndrome ([place]) Active Problems:   Gastroparesis   Osteoporosis   Ileostomy in place [facility])   Atrial fibrillation with rapid ventricular response (HCC)   Chronic respiratory failure with hypoxia ([place])   Tachycardia   Atrial fibrillation/flutter with RVR - She first presented to [facility] with A. fib with RVR in the 140s-160s and difficult to control heart rates which was limited by associated tachycardia-bradycardia syndrome with pauses up to 3-5 seconds on her recent admission that limited escalating doses of AV nodal blockers. - She was on IV diltiazem 15 mg/h, metoprolol 12.5 MG twice a day and was transferred to [facility] for evaluation by EP cardiology. - She remains on Xarelto for stroke prophylaxis. - EP cardiology input appreciated. Weaned off IV Cardizem and switched to oral verapamil along with metoprolol.  - PO verapamil and metoprolol being gradually titrated. Currently on verapamil 60 MG every 8 hourly and  metoprolol 25 MG BID. - As per EP cardiology, due to her severe malnutrition, frail body habitus, overall poor prognosis, not felt to be an appropriate PPM candidate. - Heart rate still not adequately controlled. However no pauses noted.  Chronic hypoxemic failure -Uses 2 L of oxygen chronically. -CT scan of the chest dated 11/17 was found to have findings that could have been consistent with MAC. -  Dr. Jerilee Hoh, colleague hospitalist personally discussed via phone with Dr. Linus Salmons, infectious diseases on 11/29.  He stated no need for treatment.  He would obtain AFB sputums but there is no need for respiratory isolation. Sputum AFB order in place but none sent,? Not productive. -No need seen for pulmonary evaluation at present, if situation changes could have pulmonary at [facility] assess her.  Gastroparesis/chronic pancreatitis -Continue Reglan and Creon. Stable.  Severe protein calorie malnutrition/Body mass index is 14.78 kg/m. - Consulted dietitian, input pending.   Hypothyroid - Continue Synthroid. TSH 1.955.  Adult failure to thrive - Multifactorial due to advanced age and multiple complex medical conditions.? Palliative care consultation for goals. I discussed in detail with patient's niece/HCPOA on 12/2 and she indicated that patient has moderate dementia, prior to 3 weeks ago was ambulating independently and was in usual state of health but has declined due to last 2 hospitalizations. Discussed overall poor long-term prognosis given advanced age, dementia, frail size and multiple significant comorbidities and to think regarding palliative care options. Will need to continue to have these discussions. No family at bedside today.  Acute on stage 2-3 chronic kidney disease - Creatinine on 11/19:1.01.  Creatinine has gradually increased to 1.4.? Related to volume depletion. Resolved after IV fluids. Discontinue fluids.  Hypomagnesemia Magnesium 1.3 on 11/29 and received IV magnesium  supplements. Magnesium replaced to 1.7.  Type II DM A1c on 11/12:6.9. SSI. Mildly uncontrolled and fluctuating.  Dementia without behavioral disturbance. Continue home medications.  Insomnia - As per family, patient does take Valium 2 MG daily at bedtime and has been on it for a long time.  DVT prophylaxis: Xarelto   Code Status: Full code Family Communication: Discussed with patient's caregiver at bedside. Disposition Plan: DC home when medically improved. Not medically ready for discharge.  Consultants:   Cardiology  ID via phone  Procedures:   None  Antimicrobials:  Anti-infectives (From admission, onward)   None       Subjective: Patient states that she feels better today. Had transient dyspnea yesterday which spontaneously resolved. No cough or chest pain reported. No palpitations, dizziness or lightheadedness.  Objective: Vitals:   06/16/17 0547 06/16/17 0700 06/16/17 0834 06/16/17 1100  BP: (!) 148/97  121/87   Pulse:  (!) 111  (!) 108  Resp:  18  19  Temp:  98.2 F (36.8 C)  98 F (36.7 C)  TempSrc:  Oral  Oral  SpO2:    98%  Weight:      Height:        Intake/Output Summary (Last 24 hours) at 06/16/2017 1345 Last data filed at 06/16/2017 0442 Gross per 24 hour  Intake 1542.5 ml  Output 400 ml  Net 1142.5 ml   Filed Weights   06/13/17 1647 06/14/17 0500 06/15/17 0558  Weight: 35 kg (77 lb 2.6 oz) 35.1 kg (77 lb 6.1 oz) 33.2 kg (73 lb 3.2 oz)    Examination:  General exam:  elderly female, small built, cachectic, chronically ill-looking, sleeping comfortably in bed. Appears in good spirits. Does not appear in any distress. Respiratory system: Harsh breath sounds bilaterally with scattered bilateral coarse/Velcro-like crackles (chronic), mostly posteriorly. No wheezing or rhonchi. No increased work of breathing. Cardiovascular system: S1 and S2 heard, irregularly irregular and tachycardic. No JVD or pedal edema. Telemetry: A. fib with ventricular  rate ranging mostly between 110s-130s. Overnight periodically in the 140s-150s as well. Gastrointestinal system: Abdomen is nondistended, soft and nontender. No organomegaly or masses felt. Normal bowel sounds heard. Colostomy draining liquid green stools. Stable without change.  Central nervous system: Alert and oriented 2. No focal neurological deficits. Extremities: No C/C/E, +pedal pulses Skin: No rashes, lesions or ulcers Psychiatry: Judgement and insight impaired. Mood & affect appropriate.     Data Reviewed: I have personally reviewed following labs and imaging studies  CBC: Recent Labs  Lab 06/12/17 0347 06/13/17 0447 06/15/17 0359  WBC 15.4* 16.2* 13.5*  NEUTROABS 12.1*  --   --   HGB 12.2 11.7* 12.3  HCT 38.9 37.2 39.1  MCV 99.0 97.9 98.0  PLT 369 352 559   Basic Metabolic Panel: Recent Labs  Lab 06/12/17 0347 06/13/17 0447 06/15/17 0359 06/16/17 0408  NA 136 136 136 138  K 4.1 3.6 3.8 4.0  CL 102 92* 98* 102  CO2 26 33* 31 30  GLUCOSE 218* 161* 128* 119*  BUN 18 19 31* 21*  CREATININE 1.33* 1.31* 1.45* 1.19*  CALCIUM 8.4* 8.6* 9.1 8.7*  MG 1.3*  --  1.6* 1.7  PHOS 3.0  --   --   --    GFR: Estimated Creatinine Clearance: 17.8 mL/min (A) (by C-G formula based on SCr  of 1.19 mg/dL (H)). Liver Function Tests: Recent Labs  Lab 06/12/17 0347  AST 21  ALT 10*  ALKPHOS 120  BILITOT 0.6  PROT 7.4  ALBUMIN 3.3*   Cardiac Enzymes: Recent Labs  Lab 06/12/17 0347 06/12/17 0816  TROPONINI <0.03 <0.03   CBG: Recent Labs  Lab 06/15/17 1129 06/15/17 1716 06/15/17 2203 06/16/17 0721 06/16/17 1116  GLUCAP 197* 105* 223* 130* 263*      Radiology Studies: No results found.      Scheduled Meds: . citalopram  10 mg Oral Daily  . diazepam  2 mg Oral QHS  . insulin aspart  0-9 Units Subcutaneous TID WC  . levothyroxine  25 mcg Oral QAC breakfast  . lipase/protease/amylase  12,000 Units Oral TID AC  . magnesium oxide  200 mg Oral Daily  .  mouth rinse  15 mL Mouth Rinse BID  . memantine  10 mg Oral BID  . metoCLOPramide  5 mg Oral TID AC & HS  . metoprolol tartrate  25 mg Oral BID  . Rivaroxaban  15 mg Oral Q supper  . sodium chloride flush  3 mL Intravenous Q12H  . venlafaxine XR  37.5 mg Oral Q breakfast  . verapamil  60 mg Oral Q8H   Continuous Infusions: . sodium chloride       LOS: 4 days    Vernell Leep, MD, FACP, [facility]. [facility] Pager [phone]  If 7PM-7AM, please contact night-coverage [email or web address] Password [email or web address] 06/16/2017, 1:45 PM

## 2017-06-17 LAB — GLUCOSE, CAPILLARY
GLUCOSE-CAPILLARY: 111 mg/dL — AB (ref 65–99)
GLUCOSE-CAPILLARY: 202 mg/dL — AB (ref 65–99)
Glucose-Capillary: 177 mg/dL — ABNORMAL HIGH (ref 65–99)
Glucose-Capillary: 164 mg/dL — ABNORMAL HIGH (ref 65–99)

## 2017-06-17 MED ORDER — VERAPAMIL HCL ER 180 MG PO TBCR
180.0000 mg | EXTENDED_RELEASE_TABLET | Freq: Every day | ORAL | Status: DC
  Administered 2017-06-17 – 2017-06-20 (×4): 180 mg via ORAL
  Filled 2017-06-17 (×4): qty 1

## 2017-06-17 NOTE — Progress Notes (Signed)
PROGRESS NOTE    [name]  ONG:[id number] DOB: Jan 26, 1929 DOA: 06/12/2017 PCP: Kathyrn Drown, MD     Brief Narrative:  76 fem admit 11/29 due to shortness of breath and weakness.  Hospitalized [facility] 11/12-11/21 difficult to control tachybradycardia syndrome.   During her prior hospitalization there was difficulty adjusting her rate medications due to long pauses of up to 5 seconds when doses were increased, followed by rapid rates when doses were decreased.   Cardiology was involved at that time and decision was finally made to discharge her on metoprolol 12.5 twice daily and Cardizem CD 360 mg.   She was found to again be in A. fib/flutter with rates in the 140s-160s.  C ardiology was consulted and recommended transfer to [facility] to be evaluated by the EP service.  EP Cardiology following and adjusting rate control medications.  Not felt to be appropriate candidate for PPM.   Assessment & Plan:   Principal Problem:   Tachy-brady syndrome ([place]) Active Problems:   Gastroparesis   Osteoporosis   Ileostomy in place [facility])   Atrial fibrillation with rapid ventricular response (HCC)   Chronic respiratory failure with hypoxia ([place])   Tachycardia   Atrial fibrillation/flutter with RVR - She first presented to [facility] with A. fib with RVR in the 140s-160s and difficult to control heart rates which was limited by associated tachycardia-bradycardia syndrome with pauses up to 3-5 seconds on her recent admission that limited escalating doses of AV nodal blockers. - She was on IV diltiazem 15 mg/h, metoprolol 12.5 MG twice a day and was transferred to [facility] for evaluation by EP cardiology. - She remains on Xarelto for stroke prophylaxis. - EP cardiology input appreciated. Weaned off IV Cardizem and switched to oral verapamil along with metoprolol.  - PO verapamil and metoprolol titrated.  Verapamil 180 SR, metoprolol 25 twice daily - As per EP cardiology,  due to her severe malnutrition, frail body habitus, overall poor prognosis, not appropriate PPM candidate. - Heart rate.today Rates 103-113  Chronic hypoxemic failure -Uses 2 L of oxygen chronically. -CT scan of the chest dated 11/17 was found to have findings that could have been consistent with MAC. -  Dr. Jerilee Hoh, colleague hospitalist personally discussed via phone with Dr. Linus Salmons, infectious diseases on 11/29.  He stated no need for treatment.  He would obtain AFB sputums but there is no need for respiratory isolation. Sputum AFB order in place but none sent,? Not productive. -No need seen for pulmonary evaluation at present  Gastroparesis/chronic pancreatitis -Continue Reglan and Creon. Stable.  Severe protein calorie malnutrition/Body mass index is 15.27 kg/m. - Consulted dietitian, input pending. -Likely secondary to adult failure to thrive  Hypothyroid - Continue Synthroid. TSH 1.955.  Adult failure to thrive - Multifactorial due to advanced age and multiple complex medical conditions.? Palliative care consultation for goals. I discussed in detail with patient's niece/HCPOA on 12/2 and she indicated that patient has moderate dementia, prior to 3 weeks ago was ambulating independently and was in usual state of health but has declined due to last 2 hospitalizations.  Will follow up with these discussions with niece after cardiology has the opportunity to talk with them-I have updated caregiver at the bedside  Acute on stage 2-3 chronic kidney disease - Creatinine on 11/19:1.01. Creatinine has gradually increased to 1.4.? Related to volume depletion. Resolved after IV fluids. Discontinue fluids. Repeat labs in the morning  Hypomagnesemia Magnesium 1.3 on 11/29 and received IV  magnesium supplements. Magnesium replaced to 1.7. Repeat magnesium in a.m.  Type II DM A1c on 11/12:6.9. SSI. Mildly uncontrolled and fluctuating.   Would not aggressively control   Dementia without  behavioral disturbance. Continue Namenda 10 twice daily, citalopram 10, diazepam 2 nightly, Effexor 37.5 daily  Insomnia - As per family, patient does take Valium 2 MG daily at bedtime and has been on it for a long time.  DVT prophylaxis: Xarelto   Code Status: Full code Family Communication: Discussed with patient's caregiver at bedside. Disposition Plan: DC home when medically improved.   Consultants:   Cardiology  ID via phone  Procedures:   None  Antimicrobials:  Anti-infectives (From admission, onward)   None       Subjective:  Patient seen and examined at the bedside with her regular house care person around She is in no distress currently although feels a little short of breath was short of breath overnight She has no chest pain She does not feel any tachypalpitations She has no fever no chills She has had some breakfast and is eating very well Over the past couple of months since the caregiver has been with the patient patient has made a significant decline according to them She had episodic confusional spells in the restroom and felt unsteady on her feet She is currently awake and alert She tells me she would not want to be in a skilled nursing facility-she has had caregivers come to her home for the past year  Objective: Vitals:   06/17/17 0038 06/17/17 0441 06/17/17 0608 06/17/17 0752  BP: 111/78 (!) 144/103 (!) 123/92 (!) 142/94  Pulse: (!) 111 (!) 110  (!) 110  Resp: 18 18    Temp: 97.9 F (36.6 C) (!) 97.4 F (36.3 C)  97.6 F (36.4 C)  TempSrc: Oral Oral  Oral  SpO2: 99% 97%  100%  Weight:  34.3 kg (75 lb 9.6 oz)    Height:        Intake/Output Summary (Last 24 hours) at 06/17/2017 0933 Last data filed at 06/17/2017 0045 Gross per 24 hour  Intake 240 ml  Output 200 ml  Net 40 ml   Filed Weights   06/14/17 0500 06/15/17 0558 06/17/17 0441  Weight: 35.1 kg (77 lb 6.1 oz) 33.2 kg (73 lb 3.2 oz) 34.3 kg (75 lb 9.6 oz)     Examination:   Awake alert pleasant oriented frail on nasal cannula 2 L EOMI NCAT Cannot appreciate JVD S1-S2 tachycardic irregularly irregular 3/6 murmur left lower sternal edge Chest is clinically clear without added sounds Abdomen soft nontender no rebound no guarding No lower extremity edema Neurologically intact moving all 4 limbs equally without deficit Psychiatric Pleasant no distress  Data Reviewed: I have personally reviewed following labs and imaging studies  CBC: Recent Labs  Lab 06/12/17 0347 06/13/17 0447 06/15/17 0359  WBC 15.4* 16.2* 13.5*  NEUTROABS 12.1*  --   --   HGB 12.2 11.7* 12.3  HCT 38.9 37.2 39.1  MCV 99.0 97.9 98.0  PLT 369 352 412   Basic Metabolic Panel: Recent Labs  Lab 06/12/17 0347 06/13/17 0447 06/15/17 0359 06/16/17 0408  NA 136 136 136 138  K 4.1 3.6 3.8 4.0  CL 102 92* 98* 102  CO2 26 33* 31 30  GLUCOSE 218* 161* 128* 119*  BUN 18 19 31* 21*  CREATININE 1.33* 1.31* 1.45* 1.19*  CALCIUM 8.4* 8.6* 9.1 8.7*  MG 1.3*  --  1.6* 1.7  PHOS  3.0  --   --   --    GFR: Estimated Creatinine Clearance: 18.4 mL/min (A) (by C-G formula based on SCr of 1.19 mg/dL (H)). Liver Function Tests: Recent Labs  Lab 06/12/17 0347  AST 21  ALT 10*  ALKPHOS 120  BILITOT 0.6  PROT 7.4  ALBUMIN 3.3*   Cardiac Enzymes: Recent Labs  Lab 06/12/17 0347 06/12/17 0816  TROPONINI <0.03 <0.03   CBG: Recent Labs  Lab 06/16/17 0721 06/16/17 1116 06/16/17 1610 06/16/17 2047 06/17/17 0747  GLUCAP 130* 263* 74 167* 111*      Radiology Studies: No results found.      Scheduled Meds: . citalopram  10 mg Oral Daily  . diazepam  2 mg Oral QHS  . insulin aspart  0-9 Units Subcutaneous TID WC  . levothyroxine  25 mcg Oral QAC breakfast  . lipase/protease/amylase  12,000 Units Oral TID AC  . magnesium oxide  200 mg Oral Daily  . mouth rinse  15 mL Mouth Rinse BID  . memantine  10 mg Oral BID  . metoCLOPramide  5 mg Oral TID AC &  HS  . metoprolol tartrate  25 mg Oral BID  . multivitamin with minerals  1 tablet Oral Daily  . protein supplement  1 scoop Oral TID BM  . Rivaroxaban  15 mg Oral Q supper  . sodium chloride flush  3 mL Intravenous Q12H  . venlafaxine XR  37.5 mg Oral Q breakfast  . verapamil  180 mg Oral Q1400   Continuous Infusions: . sodium chloride       LOS: 5 days    Nita Sells, MD, FACP, [facility]. [facility] Pager [phone]  If 7PM-7AM, please contact night-coverage [email or web address] Password [facility] 06/17/2017, 9:33 AM

## 2017-06-17 NOTE — Progress Notes (Signed)
Progress Note  Patient Name: [name] Date of Encounter: 06/17/2017  Primary Cardiologist: Domenic Polite  Subjective   Patient denies any active complaints this AM, mentioned another "spell" of SOB yesterday last night, no CP or sensation of palpitations  Inpatient Medications    Scheduled Meds: . citalopram  10 mg Oral Daily  . diazepam  2 mg Oral QHS  . insulin aspart  0-9 Units Subcutaneous TID WC  . levothyroxine  25 mcg Oral QAC breakfast  . lipase/protease/amylase  12,000 Units Oral TID AC  . magnesium oxide  200 mg Oral Daily  . mouth rinse  15 mL Mouth Rinse BID  . memantine  10 mg Oral BID  . metoCLOPramide  5 mg Oral TID AC & HS  . metoprolol tartrate  25 mg Oral BID  . multivitamin with minerals  1 tablet Oral Daily  . protein supplement  1 scoop Oral TID BM  . Rivaroxaban  15 mg Oral Q supper  . sodium chloride flush  3 mL Intravenous Q12H  . venlafaxine XR  37.5 mg Oral Q breakfast  . verapamil  180 mg Oral Q1400   Continuous Infusions: . sodium chloride     PRN Meds: sodium chloride, acetaminophen **OR** acetaminophen, acetaminophen, ondansetron **OR** ondansetron (ZOFRAN) IV, senna-docusate, sodium chloride flush   Vital Signs    Vitals:   06/17/17 0038 06/17/17 0441 06/17/17 0608 06/17/17 0752  BP: 111/78 (!) 144/103 (!) 123/92 (!) 142/94  Pulse: (!) 111 (!) 110  (!) 110  Resp: 18 18    Temp: 97.9 F (36.6 C) (!) 97.4 F (36.3 C)  97.6 F (36.4 C)  TempSrc: Oral Oral  Oral  SpO2: 99% 97%  100%  Weight:  75 lb 9.6 oz (34.3 kg)    Height:        Intake/Output Summary (Last 24 hours) at 06/17/2017 0848 Last data filed at 06/17/2017 0045 Gross per 24 hour  Intake 240 ml  Output 200 ml  Net 40 ml   Filed Weights   06/14/17 0500 06/15/17 0558 06/17/17 0441  Weight: 77 lb 6.1 oz (35.1 kg) 73 lb 3.2 oz (33.2 kg) 75 lb 9.6 oz (34.3 kg)    Telemetry    AFib 100-130's - Personally Reviewed  ECG    No new tracing - Personally  Reviewed  Physical Exam   Appears essentially unchanged, body habitus is extremely frail very slender, cachectic, elderly GEN: No acute distress.   Neck: at most 4-5 centimeters JVD Cardiac: irregular, no murmurs, rubs, or gallops.  Respiratory: crackles left base GI: Soft, nontender, non-distended  MS: No edema; No deformity. Neuro:  Nonfocal  Psych: Normal affect, pleasent and conversational  Labs    Chemistry Recent Labs  Lab 06/12/17 0347 06/13/17 0447 06/15/17 0359 06/16/17 0408  NA 136 136 136 138  K 4.1 3.6 3.8 4.0  CL 102 92* 98* 102  CO2 26 33* 31 30  GLUCOSE 218* 161* 128* 119*  BUN 18 19 31* 21*  CREATININE 1.33* 1.31* 1.45* 1.19*  CALCIUM 8.4* 8.6* 9.1 8.7*  PROT 7.4  --   --   --   ALBUMIN 3.3*  --   --   --   AST 21  --   --   --   ALT 10*  --   --   --   ALKPHOS 120  --   --   --   BILITOT 0.6  --   --   --  GFRNONAA 35* 36* 32* 40*  GFRAA 41* 41* 37* 47*  ANIONGAP 8 11 7 6      Hematology Recent Labs  Lab 06/12/17 0347 06/13/17 0447 06/15/17 0359  WBC 15.4* 16.2* 13.5*  RBC 3.93 3.80* 3.99  HGB 12.2 11.7* 12.3  HCT 38.9 37.2 39.1  MCV 99.0 97.9 98.0  MCH 31.0 30.8 30.8  MCHC 31.4 31.5 31.5  RDW 14.7 14.2 14.4  PLT 369 352 394    Cardiac Enzymes Recent Labs  Lab 06/12/17 0347 06/12/17 0816  TROPONINI <0.03 <0.03   No results for input(s): TROPIPOC in the last 168 hours.   BNP Recent Labs  Lab 06/12/17 0347  BNP 1,758.0*     DDimer No results for input(s): DDIMER in the last 168 hours.   Radiology    No results found.  Cardiac Studies   Echocardiogram 05/27/2017 Study Conclusions - Left ventricle: The cavity size was normal. Wall thickness was   increased in a pattern of mild LVH. Systolic function was normal.   The estimated ejection fraction was in the range of 55% to 60%.   Wall motion was normal; there were no regional wall motion   abnormalities. The study was not technically sufficient to allow   evaluation  of LV diastolic dysfunction due to atrial   fibrillation. Doppler parameters are consistent with high   ventricular filling pressure. - Aortic valve: Trileaflet; mildly thickened, mildly calcified   leaflets. There was mild stenosis. There was mild regurgitation. - Mitral valve: Moderately calcified annulus. Mildly thickened   leaflets . There was mild regurgitation. - Left atrium: The atrium was mildly to moderately dilated. - Atrial septum: No defect or patent foramen ovale was identified. - Tricuspid valve: Mildly thickened leaflets. There was moderate   regurgitation. - Pulmonary arteries: PA peak pressure: 49 mm Hg (S).  Patient Profile     83 y.o. female with tachycardia due to atrial fibrillation rapid ventricular response, but also with episodes of transient AV block with pauses of up to 5 seconds.  Assessment & Plan     1. AFib (likeltpersistent/premanent)     CHA2DS2Vasc is 4, on Xarelto (appropriately dosed for clearance of 18)  2. Likely some degree of tachy-brady with reports of pauses     Given significant malnutrition/frail body habitus, overall poor prognosis, not felt to be a PPM candidate     Has not been ambulatory now for some time with progressive functional decline     On metoprolol 25mg  BID and Verapamil 60mg  TID     Will change to calan 180mg  daily today  3. Chronic lung disease      Home O2  4. Chronic pancreatitis 5. Dementia      Has home CNA Significant functional decline over the last couple months reported by home CNA at bedsied with a couple hospitalizations Medicine team has begin discussions regarding palliative care with niece, no decisions as of yet  Agree with palliative discussions, Dr. Lovena Le will reach out to niece today as well.     For questions or updates, please contact [place] Please consult [email or web address] for contact info under Cardiology/STEMI.      Signed, Baldwin Jamaica, PA-C  06/17/2017, 8:48 AM    EP  Attending  Patient seen and examined. Agree with above. She remains in atrial fib with a CVR/RVR. All in all her rates appear reasonably well controlled. I wonder if she needs a little diuretic. See above. Palliative care is reasonable at this point.  Mikle Bosworth.D.

## 2017-06-17 NOTE — Progress Notes (Deleted)
Cardiology Office Note   Date:  06/17/2017   ID:  [name], DOB 07-Jan-1929, MRN [id number]  PCP:  Kathyrn Drown, MD  Cardiologist:  Satira Sark, MD EP: Cristopher Peru, MD  No chief complaint on file.    History of Present Illness: [name] is a 83 y.o. female who presents for posthospitalization follow-up after consultation for persistent atrial fibrillation, acute bradycardia syndrome with pauses, other history of chronic lung disease on home O2, dementia, and chronic pancreatitis.    Past Medical History:  Diagnosis Date  . Anemia   . Atrial fibrillation ([place])   . 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 Chronic abdominal pain   . Chronic neck and back pain   . COPD (chronic obstructive pulmonary disease) ([place])   . GAD (generalized anxiety disorder)   . Gastroesophageal reflux disease   . Gastroparesis   . IBS (irritable bowel syndrome)   . Migraines   . Osteoporosis   . 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 Renal insufficiency   . S/P colostomy ([place])   . Tachycardia-bradycardia syndrome [facility])     Past Surgical History:  Procedure Laterality Date  . ABDOMINAL HYSTERECTOMY    . APPENDECTOMY    . CHOLECYSTECTOMY    . COLOSTOMY    . ESOPHAGOGASTRODUODENOSCOPY    . ESOPHAGOGASTRODUODENOSCOPY N/A 08/25/2015   Procedure: ESOPHAGOGASTRODUODENOSCOPY (EGD);  Surgeon: Rogene Houston, MD;  Location: [facility] ENDO SUITE;  Service: Endoscopy;  Laterality: N/A;  730  . ESOPHAGOGASTRODUODENOSCOPY N/A 06/03/2017   Procedure: ESOPHAGOGASTRODUODENOSCOPY (EGD);  Surgeon: Rogene Houston, MD;  Location: [facility] ENDO SUITE;  Service: Endoscopy;  Laterality: N/A;  . FOREIGN BODY REMOVAL  06/03/2017   Procedure: FOREIGN BODY REMOVAL;  Surgeon: Rogene Houston, MD;  Location: [facility] ENDO SUITE;  Service: Endoscopy;;  . ILEOSCOPY N/A 08/25/2015   Procedure: ILEOSCOPY THROUGH STOMA;  Surgeon: Rogene Houston, MD;  Location: [facility] ENDO SUITE;  Service: Endoscopy;  Laterality: N/A;  . PARTIAL GASTRECTOMY   1967   3/4 gastric resection for bleeding ulcers  . TONSILLECTOMY       No current facility-administered medications for this visit.    Current Outpatient Medications  Medication Sig Dispense Refill  . citalopram (CELEXA) 10 MG tablet TAKE (1) TABLET BY MOUTH ONCE DAILY. 28 tablet 5  . metoprolol tartrate (LOPRESSOR) 25 MG tablet TAKE 1/2 TABLET TWICE DAILY FOR HIGH BLOOD PRESSURE. 28 tablet 5   Facility-Administered Medications Ordered in Other Visits  Medication Dose Route Frequency Provider Last Rate Last Dose  . 0.9 %  sodium chloride infusion  250 mL Intravenous PRN Isaac Bliss, Rayford Halsted, MD      . acetaminophen (TYLENOL) tablet 650 mg  650 mg Oral Q6H PRN Isaac Bliss, Rayford Halsted, MD   650 mg at 06/17/17 0915   Or  . acetaminophen (TYLENOL) suppository 650 mg  650 mg Rectal Q6H PRN Isaac Bliss, Rayford Halsted, MD      . acetaminophen (TYLENOL) tablet 650 mg  650 mg Oral Q6H PRN Isaac Bliss, Rayford Halsted, MD      . citalopram (CELEXA) tablet 10 mg  10 mg Oral Daily Isaac Bliss, Rayford Halsted, MD   10 mg at 06/17/17 1037  . diazepam (VALIUM) tablet 2 mg  2 mg Oral QHS Hongalgi, Lenis Dickinson, MD   2 mg at 06/16/17 2143  . insulin aspart (novoLOG) injection 0-9 Units  0-9 Units Subcutaneous TID WC Modena Jansky, MD   2 Units at 06/17/17 1212  .  levothyroxine (SYNTHROID, LEVOTHROID) tablet 25 mcg  25 mcg Oral QAC breakfast Isaac Bliss, Rayford Halsted, MD   25 mcg at 06/17/17 [phone]  . lipase/protease/amylase (CREON) capsule 12,000 Units  12,000 Units Oral TID AC Isaac Bliss, Rayford Halsted, MD   12,000 Units at 06/17/17 1211  . magnesium oxide (MAG-OX) tablet 200 mg  200 mg Oral Daily Lendon Colonel, NP   400 mg at 06/17/17 1037  . MEDLINE mouth rinse  15 mL Mouth Rinse BID Isaac Bliss, Rayford Halsted, MD   15 mL at 06/17/17 1046  . memantine (NAMENDA) tablet 10 mg  10 mg Oral BID Isaac Bliss, Rayford Halsted, MD   10 mg at 06/17/17 1037  . metoCLOPramide (REGLAN) tablet 5 mg  5 mg  Oral TID AC & HS Isaac Bliss, Rayford Halsted, MD   5 mg at 06/17/17 1211  . metoprolol tartrate (LOPRESSOR) tablet 25 mg  25 mg Oral BID Evans Lance, MD   25 mg at 06/17/17 1037  . multivitamin with minerals tablet 1 tablet  1 tablet Oral Daily Modena Jansky, MD   1 tablet at 06/17/17 1036  . ondansetron (ZOFRAN) tablet 4 mg  4 mg Oral Q6H PRN Isaac Bliss, Rayford Halsted, MD       Or  . ondansetron [facility]) injection 4 mg  4 mg Intravenous Q6H PRN Isaac Bliss, Rayford Halsted, MD   4 mg at 06/13/17 2149  . protein supplement (RESOURCE BENEPROTEIN) powder 6 g  1 scoop Oral TID BM Hongalgi, Lenis Dickinson, MD   6 g at 06/17/17 1439  . Rivaroxaban (XARELTO) tablet 15 mg  15 mg Oral Q supper Isaac Bliss, Rayford Halsted, MD   15 mg at 06/16/17 1819  . senna-docusate (Senokot-S) tablet 1 tablet  1 tablet Oral QHS PRN Isaac Bliss, Rayford Halsted, MD      . sodium chloride flush (NS) 0.9 % injection 3 mL  3 mL Intravenous Q12H Isaac Bliss, Rayford Halsted, MD   3 mL at 06/17/17 1047  . sodium chloride flush (NS) 0.9 % injection 3 mL  3 mL Intravenous PRN Isaac Bliss, Rayford Halsted, MD      . venlafaxine XR [facility]) 24 hr capsule 37.5 mg  37.5 mg Oral Q breakfast Modena Jansky, MD   37.5 mg at 06/17/17 0915  . verapamil (CALAN-SR) CR tablet 180 mg  180 mg Oral Q1400 Baldwin Jamaica, PA-C   180 mg at 06/17/17 1433    Allergies:   Levaquin [levofloxacin]; Aspirin; Tetanus toxoid; Zolpidem tartrate; and Fosamax [alendronate sodium]    Social History:  The patient  reports that  has never smoked. she has never used smokeless tobacco. She reports that she does not drink alcohol or use drugs.   Family History:  The patient's family history includes Cancer in her sister; Diabetes in her brother, brother, sister, sister, son, and son; Emphysema in her sister; Hypertension in her son; Irritable bowel syndrome in her son; Stroke in her brother, brother, father, and mother.    ROS: All other systems are  reviewed and negative. Unless otherwise mentioned in H&P    PHYSICAL EXAM: VS:  There were no vitals taken for this visit. , BMI There is no height or weight on file to calculate BMI. GEN: Well nourished, well developed, in no acute distress  HEENT: normal  Neck: no JVD, carotid bruits, or masses Cardiac: ***RRR; no murmurs, rubs, or gallops,no edema  Respiratory:  clear to auscultation bilaterally, normal work  of breathing GI: soft, nontender, nondistended, + BS MS: no deformity or atrophy  Skin: warm and dry, no rash Neuro:  Strength and sensation are intact Psych: euthymic mood, full affect   EKG:  EKG {ACTION; IS/IS [id number]} ordered today. The ekg ordered today demonstrates ***   Recent Labs: 06/12/2017: ALT 10; B Natriuretic Peptide 1,758.0; TSH 1.955 06/15/2017: Hemoglobin 12.3; Platelets 394 06/16/2017: BUN 21; Creatinine, Ser 1.19; Magnesium 1.7; Potassium 4.0; Sodium 138    Lipid Panel    Component Value Date/Time   CHOL 154 10/15/2013 0916   TRIG 177 (H) 10/15/2013 0916   HDL 50 10/15/2013 0916   CHOLHDL 3.1 10/15/2013 0916   VLDL 35 10/15/2013 0916   LDLCALC 69 10/15/2013 0916      Wt Readings from Last 3 Encounters:  06/17/17 75 lb 9.6 oz (34.3 kg)  06/04/17 83 lb 3.2 oz (37.7 kg)  05/26/17 77 lb (34.9 kg)      Other studies Reviewed: Additional studies/ records that were reviewed today include: ***. Review of the above records demonstrates: ***   ASSESSMENT AND PLAN:  1.  ***   Current medicines are reviewed at length with the patient today.    Labs/ tests ordered today include: *** Phill Myron. [place], ANP, AACC   06/17/2017 4:54 PM    [facility] [address] [place], [place] [address] Phone: [phone]; Fax: [phone]

## 2017-06-17 NOTE — Progress Notes (Signed)
Physical Therapy Treatment Patient Details Name: [name] MRN: [id number] DOB: [birth year] Today's Date: 06/17/2017    History of Present Illness Pt is an 83 y.o. female admitted with tachy-brady syndrome. Pt had recent admission for same dx. PMH consists of anemia, cavitary lung disease, lung mass, and s/p colostomy.     PT Comments    Pt is progressing with mobility and activity tolerance--amb ~80' with RW today, VSS, see note for details; continue current plan of care  Follow Up Recommendations  Home health PT;Supervision/Assistance - 24 hour;Supervision for mobility/OOB     Equipment Recommendations  None recommended by PT    Recommendations for Other Services       Precautions / Restrictions Precautions Precaution Comments: monitor HR    Mobility  Bed Mobility Overal bed mobility: Modified Independent             General bed mobility comments: incr time only, HOB  @30 *  Transfers Overall transfer level: Needs assistance Equipment used: Rolling walker (2 wheeled) Transfers: Sit to/from Stand Sit to Stand: Min guard         General transfer comment: min/guard   Ambulation/Gait Ambulation/Gait assistance: Min guard Ambulation Distance (Feet): 80 Feet Assistive device: Rolling walker (2 wheeled) Gait Pattern/deviations: Step-through pattern;Decreased stride length     General Gait Details: pt more steady with RW although requiring intermittent assist to maneuver (uses rollator at baseline/furniture walks); very slow gait speed, no overt LOB, one brief standing rest d/t slightly incr WOB; HR 119-122-116, SpO2 stable at 98% on 2.5 L [place] O2, most recent supine BP 125/103 priro to PT session, BP sitting EOB 128/83   Stairs            Wheelchair Mobility    Modified Rankin (Stroke Patients Only)       Balance Overall balance assessment: Needs assistance Sitting-balance support: No upper extremity supported;Feet supported Sitting balance-Leahy  Scale: Good       Standing balance-Leahy Scale: Fair Standing balance comment: pt is able to maintain static stand without UE support                            Cognition Arousal/Alertness: Awake/alert Behavior During Therapy: WFL for tasks assessed/performed Overall Cognitive Status: Within Functional Limits for tasks assessed                                 General Comments: very pleasant       Exercises General Exercises - Lower Extremity Ankle Circles/Pumps: AROM;Both;10 reps Quad Sets: AROM;Both;10 reps Gluteal Sets: AROM;10 reps;Both    General Comments        Pertinent Vitals/Pain Pain Assessment: No/denies pain    Home Living                      Prior Function            PT Goals (current goals can now be found in the care plan section) Acute Rehab PT Goals Patient Stated Goal: get stronger PT Goal Formulation: With patient Time For Goal Achievement: 06/29/17 Potential to Achieve Goals: Good Progress towards PT goals: Progressing toward goals    Frequency    Min 3X/week      PT Plan Current plan remains appropriate    Co-evaluation              AM-PAC PT "6  Clicks" Daily Activity  Outcome Measure  Difficulty turning over in bed (including adjusting bedclothes, sheets and blankets)?: None Difficulty moving from lying on back to sitting on the side of the bed? : None Difficulty sitting down on and standing up from a chair with arms (e.g., wheelchair, bedside commode, etc,.)?: A Little Help needed moving to and from a bed to chair (including a wheelchair)?: A Little Help needed walking in hospital room?: A Little Help needed climbing 3-5 steps with a railing? : A Little 6 Click Score: 20    End of Session Equipment Utilized During Treatment: Gait belt;Oxygen Activity Tolerance: Patient tolerated treatment well Patient left: in bed;with call bell/phone within reach;with family/visitor present   PT  Visit Diagnosis: Unsteadiness on feet (R26.81);Difficulty in walking, not elsewhere classified (R26.2);Muscle weakness (generalized) (M62.81)     Time: 0932-3557 PT Time Calculation (min) (ACUTE ONLY): 23 min  Charges:  $Gait Training: 23-37 mins                    G CodesKenyon Ana, PT Pager: [phone] 06/17/2017    Kenyon Ana 06/17/2017, 11:12 AM

## 2017-06-18 DIAGNOSIS — Z7189 Other specified counseling: Secondary | ICD-10-CM

## 2017-06-18 DIAGNOSIS — Z66 Do not resuscitate: Secondary | ICD-10-CM

## 2017-06-18 DIAGNOSIS — I509 Heart failure, unspecified: Secondary | ICD-10-CM

## 2017-06-18 DIAGNOSIS — Z515 Encounter for palliative care: Secondary | ICD-10-CM

## 2017-06-18 LAB — COMPREHENSIVE METABOLIC PANEL
ALBUMIN: 3 g/dL — AB (ref 3.5–5.0)
ALK PHOS: 95 U/L (ref 38–126)
ALT: 15 U/L (ref 14–54)
ANION GAP: 9 (ref 5–15)
AST: 19 U/L (ref 15–41)
BILIRUBIN TOTAL: 0.6 mg/dL (ref 0.3–1.2)
BUN: 20 mg/dL (ref 6–20)
CALCIUM: 9.7 mg/dL (ref 8.9–10.3)
CO2: 36 mmol/L — AB (ref 22–32)
CREATININE: 1.02 mg/dL — AB (ref 0.44–1.00)
Chloride: 94 mmol/L — ABNORMAL LOW (ref 101–111)
GFR calc Af Amer: 56 mL/min — ABNORMAL LOW (ref >60)
GFR calc non Af Amer: 48 mL/min — ABNORMAL LOW (ref >60)
GLUCOSE: 122 mg/dL — AB (ref 65–99)
Potassium: 4.4 mmol/L (ref 3.5–5.1)
SODIUM: 139 mmol/L (ref 135–145)
TOTAL PROTEIN: 6.8 g/dL (ref 6.5–8.1)

## 2017-06-18 LAB — CBC WITH DIFFERENTIAL/PLATELET
BASOS PCT: 0 %
Basophils Absolute: 0 10*3/uL (ref 0.0–0.1)
Eosinophils Absolute: 0.3 10*3/uL (ref 0.0–0.7)
Eosinophils Relative: 2 %
HEMATOCRIT: 38.2 % (ref 36.0–46.0)
HEMOGLOBIN: 12 g/dL (ref 12.0–15.0)
LYMPHS ABS: 4.5 10*3/uL — AB (ref 0.7–4.0)
Lymphocytes Relative: 31 %
MCH: 31.2 pg (ref 26.0–34.0)
MCHC: 31.4 g/dL (ref 30.0–36.0)
MCV: 99.2 fL (ref 78.0–100.0)
MONOS PCT: 10 %
Monocytes Absolute: 1.5 10*3/uL — ABNORMAL HIGH (ref 0.1–1.0)
NEUTROS ABS: 8.3 10*3/uL — AB (ref 1.7–7.7)
NEUTROS PCT: 57 %
Platelets: 389 10*3/uL (ref 150–400)
RBC: 3.85 MIL/uL — AB (ref 3.87–5.11)
RDW: 14.5 % (ref 11.5–15.5)
WBC: 14.7 10*3/uL — AB (ref 4.0–10.5)

## 2017-06-18 LAB — GLUCOSE, CAPILLARY
GLUCOSE-CAPILLARY: 147 mg/dL — AB (ref 65–99)
Glucose-Capillary: 123 mg/dL — ABNORMAL HIGH (ref 65–99)
Glucose-Capillary: 133 mg/dL — ABNORMAL HIGH (ref 65–99)
Glucose-Capillary: 183 mg/dL — ABNORMAL HIGH (ref 65–99)

## 2017-06-18 LAB — MAGNESIUM: Magnesium: 1.6 mg/dL — ABNORMAL LOW (ref 1.7–2.4)

## 2017-06-18 MED ORDER — MORPHINE SULFATE (CONCENTRATE) 10 MG/0.5ML PO SOLN: 5.0000 mg | ORAL | Status: DC | PRN

## 2017-06-18 NOTE — Care Management Important Message (Signed)
Important Message  Patient Details  Name: [name] MRN: [id number] Date of Birth: 1928/09/08   Medicare Important Message Given:  Yes    [name] 06/18/2017, 10:33 AM

## 2017-06-18 NOTE — Progress Notes (Signed)
PROGRESS NOTE    [name]  YDX:[id number] DOB: 04/04/1929 DOA: 06/12/2017 PCP: Kathyrn Drown, MD     Brief Narrative:   58 fem admit 11/29 due to shortness of breath and weakness.  Hospitalized [facility] 11/12-11/21 difficult to control tachybradycardia syndrome.   During her prior hospitalization there was difficulty adjusting her rate medications due to long pauses of up to 5 seconds when doses were increased, followed by rapid rates when doses were decreased.   Cardiology was involved at that time and decision was finally made to discharge her on metoprolol 12.5 twice daily and Cardizem CD 360 mg.   She was found to again be in A. fib/flutter with rates in the 140s-160s.  C ardiology was consulted and recommended transfer to [facility] to be evaluated by the EP service.  EP Cardiology following and adjusting rate control medications.  Not felt to be appropriate candidate for PPM.   Assessment & Plan:   Principal Problem:   Tachy-brady syndrome ([place]) Active Problems:   Gastroparesis   Osteoporosis   Ileostomy in place [facility])   Atrial fibrillation with rapid ventricular response (HCC)   Chronic respiratory failure with hypoxia (HCC)   Tachycardia   Acute on chronic congestive heart failure ([place])   Palliative care encounter   DNR (do not resuscitate)   Encounter for hospice care discussion   Atrial fibrillation/flutter with RVR - She first presented to [facility] with A. fib with RVR in the 140s-160s and difficult to control heart rates which was limited by associated tachycardia-bradycardia syndrome with pauses up to 3-5 seconds on her recent admission that limited escalating doses of AV nodal blockers. - She was on IV diltiazem 15 mg/h, metoprolol 12.5 MG twice a day and was transferred to [facility] for evaluation by EP cardiology. - She remains on Xarelto for stroke prophylaxis. - EP cardiology input appreciated. Weaned off IV Cardizem and switched to  oral verapamil along with metoprolol.  - PO verapamil and metoprolol titrated.  Verapamil 180 SR, metoprolol 25 twice daily - As per EP cardiology, due to her severe malnutrition, frail body habitus, overall poor prognosis, not appropriate PPM candidate. - Heart rate 120-130 -see palliatve notes as below  Chronic hypoxemic failure -Uses 2 L of oxygen chronically. -CT scan of the chest dated 11/17 was found to have findings that could have been consistent with MAC. -  Dr. Jerilee Hoh, colleague hospitalist personally discussed via phone with Dr. Linus Salmons, infectious diseases on 11/29.  He stated no need for treatment.  He would obtain AFB sputums but there is no need for respiratory isolation. Sputum AFB order in place but none sent,? Not productive. -No need seen for pulmonary evaluation at present  Gastroparesis/chronic pancreatitis -Continue Reglan and Creon. Stable.  Severe protein calorie malnutrition/Body mass index is 15.23 kg/m. - Consulted dietitian, input pending. -Likely secondary to adult failure to thrive  Hypothyroid - Continue Synthroid. TSH 1.955.  Adult failure to thrive - Multifactorial due to advanced age and multiple complex medical conditions.? Palliative care consultation for goals. I discussed in detail with patient's niece/HCPOA on 12/2 and she indicated that patient has moderate dementia, prior to 3 weeks ago was ambulating independently and was in usual state of health but has declined due to last 2 hospitalizations.  Called to D/w niece [name] after palliative and Cardiology discussions  Acute on stage 2-3 chronic kidney disease - Creatinine on 11/19:1.01. Creatinine has gradually increased to 1.4.? Related to volume depletion. Resolved  after IV fluids. Discontinue fluids. Repeat labs in the morning  Hypomagnesemia Magnesium 1.3 on 11/29 and received IV magnesium supplements. Magnesium replaced to 1.7. Repeat magnesium 1.8  Type II DM A1c on 11/12:6.9. SSI.  Mildly uncontrolled and fluctuating.   Would not aggressively control  Dementia without behavioral disturbance. Continue Namenda 10 twice daily, citalopram 10, diazepam 2 nightly, Effexor 37.5 daily  Insomnia - As per family, patient does take Valium 2 MG daily at bedtime and has been on it for a long time.  DVT prophylaxis: Xarelto   Code Status: Full code Family Communication: Discussed with patient's caregiver at bedside. Disposition Plan: DC home when medically improved. Needs [facility]  Consultants:   Cardiology  ID via phone  Procedures:   None  Antimicrobials:  Anti-infectives (From admission, onward)   None      Subjective:  Well Some cp-tachy no fever no chills   Objective: Vitals:   06/18/17 0420 06/18/17 0424 06/18/17 0751 06/18/17 0900  BP:  (!) 134/92 (!) 129/97 (!) 141/93  Pulse:  (!) 109 (!) 122 (!) 119  Resp:  19  20  Temp:  97.8 F (36.6 C)  97.9 F (36.6 C)  TempSrc:  Oral  Oral  SpO2:  100%  97%  Weight: 34.2 kg (75 lb 6.4 oz)     Height:        Intake/Output Summary (Last 24 hours) at 06/18/2017 1336 Last data filed at 06/18/2017 0800 Gross per 24 hour  Intake 603 ml  Output 775 ml  Net -172 ml   Filed Weights   06/15/17 0558 06/17/17 0441 06/18/17 0420  Weight: 33.2 kg (73 lb 3.2 oz) 34.3 kg (75 lb 9.6 oz) 34.2 kg (75 lb 6.4 oz)    Examination:   Awake anxious frail on nasal cannula 2 L EOMI NCAT Cannot appreciate JVD S1-S2 tachycardic irregularly irregular 3/6 murmur left lower sternal edge Chest is clinically clear without added sounds Abdomen soft nontender no rebound no guarding No lower extremity edema Neurologically intact moving all 4 limbs equally without deficit Pleasant no distress  Data Reviewed: I have personally reviewed following labs and imaging studies  CBC: Recent Labs  Lab 06/12/17 0347 06/13/17 0447 06/15/17 0359 06/18/17 0414  WBC 15.4* 16.2* 13.5* 14.7*  NEUTROABS 12.1*  --   --   8.3*  HGB 12.2 11.7* 12.3 12.0  HCT 38.9 37.2 39.1 38.2  MCV 99.0 97.9 98.0 99.2  PLT 369 352 394 161   Basic Metabolic Panel: Recent Labs  Lab 06/12/17 0347 06/13/17 0447 06/15/17 0359 06/16/17 0408 06/18/17 0414  NA 136 136 136 138 139  K 4.1 3.6 3.8 4.0 4.4  CL 102 92* 98* 102 94*  CO2 26 33* 31 30 36*  GLUCOSE 218* 161* 128* 119* 122*  BUN 18 19 31* 21* 20  CREATININE 1.33* 1.31* 1.45* 1.19* 1.02*  CALCIUM 8.4* 8.6* 9.1 8.7* 9.7  MG 1.3*  --  1.6* 1.7 1.6*  PHOS 3.0  --   --   --   --    GFR: Estimated Creatinine Clearance: 21.4 mL/min (A) (by C-G formula based on SCr of 1.02 mg/dL (H)). Liver Function Tests: Recent Labs  Lab 06/12/17 0347 06/18/17 0414  AST 21 19  ALT 10* 15  ALKPHOS 120 95  BILITOT 0.6 0.6  PROT 7.4 6.8  ALBUMIN 3.3* 3.0*   Cardiac Enzymes: Recent Labs  Lab 06/12/17 0347 06/12/17 0816  TROPONINI <0.03 <0.03   CBG: Recent Labs  Lab 06/17/17 1114 06/17/17 1640 06/17/17 2140 06/18/17 0817 06/18/17 1126  GLUCAP 177* 202* 164* 123* 133*   Radiology Studies: No results found.  Scheduled Meds: . citalopram  10 mg Oral Daily  . diazepam  2 mg Oral QHS  . insulin aspart  0-9 Units Subcutaneous TID WC  . levothyroxine  25 mcg Oral QAC breakfast  . lipase/protease/amylase  12,000 Units Oral TID AC  . magnesium oxide  200 mg Oral Daily  . mouth rinse  15 mL Mouth Rinse BID  . metoCLOPramide  5 mg Oral TID AC & HS  . metoprolol tartrate  25 mg Oral BID  . protein supplement  1 scoop Oral TID BM  . Rivaroxaban  15 mg Oral Q supper  . sodium chloride flush  3 mL Intravenous Q12H  . venlafaxine XR  37.5 mg Oral Q breakfast  . verapamil  180 mg Oral Q1400   Continuous Infusions: . sodium chloride       LOS: 6 days    Verneita Griffes, MD [facility] ([phone]   If 7PM-7AM, please contact night-coverage [email or web address] Password [email or web address] 06/18/2017, 1:36 PM

## 2017-06-18 NOTE — Consult Note (Signed)
Consultation Note Date: 06/18/2017   Patient Name: [name]  DOB: 1928-09-16  MRN: [id number]  Age / Sex: 83 y.o., female  PCP: Kathyrn Drown, MD Referring Physician: Nita Sells, MD  Reason for Consultation: Establishing goals of care  HPI/Patient Profile: 83 y.o. female  with past medical history of chronic lung disease, tachy/brady syndrome, chronic pancreatitis and GI dysmotility, severe malnutrition, and dementia who was admitted on 06/12/2017 with shortness of breath and weakness. She had a recent admission in November for the same symptoms.   [name] has been cared for by the hospitalists and evaluated by cardiology.  Her recurrent symptoms appear to be caused by a combination of chronic lung disease, very difficult to control afib, and general frailty/deconditioning.  She is not a candidate for invasive procedures or pacemaker placement.  This morning she is alert, comfortable and without complaint.  She appears medically optimized unfortunately the medical system is unable to change the course of nature so her symptoms will recur.  Clinical Assessment and Goals of Care:  I have reviewed medical records including EPIC notes, labs and imaging, received report from Dr. Verlon Au, assessed the patient and then met at the bedside along with her niece, [name], [name]  to discuss diagnosis prognosis, [place], EOL wishes, disposition and options.  I introduced Palliative Medicine as specialized medical care for people living with serious illness. It focuses on providing relief from the symptoms and stress of a serious illness. The goal is to improve quality of life for both the patient and the family.  We discussed a brief life review of the patient. She was born and raised in [place].  She worked her entire career at [employer] but never smoked.  She lives with her husband at  home with full time care.  They had 3 sons - all of which have unfortunately pre-deceased them.  As far as functional and nutritional status she has difficulty swallowing and processing her food due to GI dysmotility.  Her desire to eat is inconsistent - to be expected with dementia.  She was able to walk 48' with a rolling walker in the hospital with PT.  [name] indicates that her decline has been rapid over the last month.   We discussed their current illness and what it means in the larger context of their on-going co-morbidities.  Natural disease trajectory and expectations at EOL were discussed.  I attempted to elicit values and goals of care important to the patient.   The difference between aggressive medical intervention and comfort care was considered in light of the patient's goals of care. Advanced directives, concepts specific to code status, artifical feeding and hydration, and rehospitalization were considered and discussed.  The patient wants to be at home with her husband.  She does not want to continue returning to the hospital.  However, [name] appreciated the re-assurance that if they are unable to make her comfortable at home - she could bring her to the hospital for comfort care.  We discussed code status.  [name] understands that at this point a Code Blue would only make her Aunt's death a traumatic.  Consequently she has chosen DNR for Mrs.  [name].  Hospice and Palliative Care services outpatient were explained and offered.  [name] would like for her Aunt to go home with [facility] - but she requests that we describe them as [facility] rather than Hospice for now.  Her son died at [place] wants time to introduce the concept to [place] without triggering her anxiety.  Questions and concerns were addressed.  The family was encouraged to call with questions or concerns.        Primary Decision Maker:  HCPOA, niece,  [name]    SUMMARY OF RECOMMENDATIONS    DNR - please send home with a [facility] DNR Form.  Home with [facility] services - please refer to them as "home health" in front of the patient.  Morphine concentrate PRN SOB, Anxiety at home.  Namenda discontinued.  Would continue all other home meds for now including synthroid and creon.   Code Status/Advance Care Planning:  DNR   Additional Recommendations (Limitations, Scope, Preferences):  Avoid Hospitalization  Psycho-social/Spiritual:   Desire for further Chaplaincy support: yes - hospice to follow up at home.  Prognosis: weeks to less than 6 months. She is at high risk for a life ending event - I.e. Arrhythmia, respiratory failure.  Patient has severe malnutrition with rapid physical decline;  Rapid atrial fibrillation resistant to medical management; chronic lung disease; and chronic kidney disease.    Discharge Planning: Home with Hospice      Primary Diagnoses: Present on Admission: . Tachycardia . Tachy-brady syndrome ([place]) . Gastroparesis . Osteoporosis . Chronic respiratory failure with hypoxia ([place])   I have reviewed the medical record, interviewed the patient and family, and examined the patient. The following aspects are pertinent.  Past Medical History:  Diagnosis Date  . Anemia   . Atrial fibrillation ([place])   . 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 Chronic abdominal pain   . Chronic neck and back pain   . COPD (chronic obstructive pulmonary disease) ([place])   . GAD (generalized anxiety disorder)   . Gastroesophageal reflux disease   . Gastroparesis   . IBS (irritable bowel syndrome)   . Migraines   . Osteoporosis   . 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 Renal insufficiency   . S/P colostomy ([place])   . Tachycardia-bradycardia syndrome [facility])    Social History   Socioeconomic History  . Marital status: Married    Spouse name: None  . Number of children: None  . Years of education:  None  . Highest education level: None  Social Needs  . Financial resource strain: None  . Food insecurity - worry: None  . Food insecurity - inability: None  . Transportation needs - medical: None  . Transportation needs - non-medical: None  Occupational History  . None  Tobacco Use  . Smoking status: Never Smoker  . Smokeless tobacco: Never Used  Substance and Sexual Activity  . Alcohol use: No    Alcohol/week: 0.0 oz  . Drug use: No  . Sexual activity: None  Other Topics Concern  . None  Social History Narrative  . None   Family History  Problem Relation Age of Onset  . Stroke Mother   . Stroke Father   . Diabetes Sister   . Diabetes Brother   .  Stroke Brother   . Cancer Sister        unknown type  . Diabetes Sister   . Emphysema Sister   . Diabetes Brother   . Stroke Brother   . Diabetes Son   . Irritable bowel syndrome Son   . Diabetes Son   . Hypertension Son    Scheduled Meds: . citalopram  10 mg Oral Daily  . diazepam  2 mg Oral QHS  . insulin aspart  0-9 Units Subcutaneous TID WC  . levothyroxine  25 mcg Oral QAC breakfast  . lipase/protease/amylase  12,000 Units Oral TID AC  . magnesium oxide  200 mg Oral Daily  . mouth rinse  15 mL Mouth Rinse BID  . metoCLOPramide  5 mg Oral TID AC & HS  . metoprolol tartrate  25 mg Oral BID  . protein supplement  1 scoop Oral TID BM  . Rivaroxaban  15 mg Oral Q supper  . sodium chloride flush  3 mL Intravenous Q12H  . venlafaxine XR  37.5 mg Oral Q breakfast  . verapamil  180 mg Oral Q1400   Continuous Infusions: . sodium chloride     PRN Meds:.sodium chloride, acetaminophen **OR** acetaminophen, acetaminophen, ondansetron **OR** ondansetron (ZOFRAN) IV, senna-docusate, sodium chloride flush Allergies  Allergen Reactions  . Levaquin [Levofloxacin] Other (See Comments)    Patient states that she became weak and extremely tired. Also, it made her real dizzy.  . Aspirin Nausea And Vomiting    Nausea/vomiting  blood  . Tetanus Toxoid Hives and Swelling  . Zolpidem Tartrate     Not in right state of mind  . Fosamax [Alendronate Sodium] Other (See Comments)    Severe gastritis reflux.   Review of Systems no complaints currently.  Has been suffering with weakness, SOB, palpitations.  Physical Exam  Elderly frail female, alert, orientated, calm CV irreg irreg resp no distress Abdomen soft, nt, nd Ext without edema.  Vital Signs: BP (!) 129/97   Pulse (!) 122   Temp 97.8 F (36.6 C) (Oral)   Resp 19   Ht _0  (1.499 m)   Wt 34.2 kg (75 lb 6.4 oz)   SpO2 100%   BMI 15.23 kg/m  Pain Assessment: No/denies pain   Pain Score: 0-No pain   SpO2: SpO2: 100 % O2 Device:SpO2: 100 % O2 Flow Rate: .O2 Flow Rate (L/min): 2.5 L/min  IO: Intake/output summary:   Intake/Output Summary (Last 24 hours) at 06/18/2017 1012 Last data filed at 06/18/2017 0800 Gross per 24 hour  Intake 843 ml  Output 775 ml  Net 68 ml    LBM: Last BM Date: 06/18/17 Baseline Weight: Weight: 37.6 kg (83 lb) Most recent weight: Weight: 34.2 kg (75 lb 6.4 oz)     Palliative Assessment/Data: 40     Time In: 9:00 Time Out: 10:30 Time Total: 90 min. Greater than 50%  of this time was spent counseling and coordinating care related to the above assessment and plan.  Signed by: Florentina Jenny, PA-C Palliative Medicine Pager: [phone]  Please contact Palliative Medicine Team phone at [phone] for questions and concerns.  For individual provider: See Shea Evans

## 2017-06-18 NOTE — Progress Notes (Signed)
Progress Note  Patient Name: [name] Date of Encounter: 06/18/2017  Primary Cardiologist: Domenic Polite  Subjective   Patient denies any active complaints this AM, no reports of SOB yesterday or last night, no CP or sensation of palpitations  Inpatient Medications    Scheduled Meds: . citalopram  10 mg Oral Daily  . diazepam  2 mg Oral QHS  . insulin aspart  0-9 Units Subcutaneous TID WC  . levothyroxine  25 mcg Oral QAC breakfast  . lipase/protease/amylase  12,000 Units Oral TID AC  . magnesium oxide  200 mg Oral Daily  . mouth rinse  15 mL Mouth Rinse BID  . memantine  10 mg Oral BID  . metoCLOPramide  5 mg Oral TID AC & HS  . metoprolol tartrate  25 mg Oral BID  . multivitamin with minerals  1 tablet Oral Daily  . protein supplement  1 scoop Oral TID BM  . Rivaroxaban  15 mg Oral Q supper  . sodium chloride flush  3 mL Intravenous Q12H  . venlafaxine XR  37.5 mg Oral Q breakfast  . verapamil  180 mg Oral Q1400   Continuous Infusions: . sodium chloride     PRN Meds: sodium chloride, acetaminophen **OR** acetaminophen, acetaminophen, ondansetron **OR** ondansetron (ZOFRAN) IV, senna-docusate, sodium chloride flush   Vital Signs    Vitals:   06/17/17 2140 06/18/17 0000 06/18/17 0420 06/18/17 0424  BP: 114/81 123/74  (!) 134/92  Pulse: (!) 114 97  (!) 109  Resp:  18  19  Temp:  97.8 F (36.6 C)  97.8 F (36.6 C)  TempSrc:  Oral  Oral  SpO2: 98% 99%  100%  Weight:   75 lb 6.4 oz (34.2 kg)   Height:        Intake/Output Summary (Last 24 hours) at 06/18/2017 0745 Last data filed at 06/18/2017 0400 Gross per 24 hour  Intake 843 ml  Output 475 ml  Net 368 ml   Filed Weights   06/15/17 0558 06/17/17 0441 06/18/17 0420  Weight: 73 lb 3.2 oz (33.2 kg) 75 lb 9.6 oz (34.3 kg) 75 lb 6.4 oz (34.2 kg)    Telemetry    AFib 80's-110, still gets up to about 120's though not as consistantly - Personally Reviewed  ECG    No new tracing - Personally  Reviewed  Physical Exam   Appears essentially unchanged, body habitus is extremely frail very slender, cachectic, elderly GEN: sleeping, easily woken, no acute distress.   Neck: ano appreciated JVD Cardiac: irregular, no murmurs, rubs, or gallops.  Respiratory: diminished at bases GI: Soft, nontender, non-distended  MS: No edema; No deformity. Neuro:  Nonfocal  Psych: Normal affect, pleasent and conversational  Labs    Chemistry Recent Labs  Lab 06/12/17 0347  06/15/17 0359 06/16/17 0408 06/18/17 0414  NA 136   < > 136 138 139  K 4.1   < > 3.8 4.0 4.4  CL 102   < > 98* 102 94*  CO2 26   < > 31 30 36*  GLUCOSE 218*   < > 128* 119* 122*  BUN 18   < > 31* 21* 20  CREATININE 1.33*   < > 1.45* 1.19* 1.02*  CALCIUM 8.4*   < > 9.1 8.7* 9.7  PROT 7.4  --   --   --  6.8  ALBUMIN 3.3*  --   --   --  3.0*  AST 21  --   --   --  19  ALT 10*  --   --   --  15  ALKPHOS 120  --   --   --  95  BILITOT 0.6  --   --   --  0.6  GFRNONAA 35*   < > 32* 40* 48*  GFRAA 41*   < > 37* 47* 56*  ANIONGAP 8   < > 7 6 9    < > = values in this interval not displayed.     Hematology Recent Labs  Lab 06/13/17 0447 06/15/17 0359 06/18/17 0414  WBC 16.2* 13.5* 14.7*  RBC 3.80* 3.99 3.85*  HGB 11.7* 12.3 12.0  HCT 37.2 39.1 38.2  MCV 97.9 98.0 99.2  MCH 30.8 30.8 31.2  MCHC 31.5 31.5 31.4  RDW 14.2 14.4 14.5  PLT 352 394 389    Cardiac Enzymes Recent Labs  Lab 06/12/17 0347 06/12/17 0816  TROPONINI <0.03 <0.03   No results for input(s): TROPIPOC in the last 168 hours.   BNP Recent Labs  Lab 06/12/17 0347  BNP 1,758.0*     DDimer No results for input(s): DDIMER in the last 168 hours.   Radiology    No results found.  Cardiac Studies   Echocardiogram 05/27/2017 Study Conclusions - Left ventricle: The cavity size was normal. Wall thickness was   increased in a pattern of mild LVH. Systolic function was normal.   The estimated ejection fraction was in the range of 55%  to 60%.   Wall motion was normal; there were no regional wall motion   abnormalities. The study was not technically sufficient to allow   evaluation of LV diastolic dysfunction due to atrial   fibrillation. Doppler parameters are consistent with high   ventricular filling pressure. - Aortic valve: Trileaflet; mildly thickened, mildly calcified   leaflets. There was mild stenosis. There was mild regurgitation. - Mitral valve: Moderately calcified annulus. Mildly thickened   leaflets . There was mild regurgitation. - Left atrium: The atrium was mildly to moderately dilated. - Atrial septum: No defect or patent foramen ovale was identified. - Tricuspid valve: Mildly thickened leaflets. There was moderate   regurgitation. - Pulmonary arteries: PA peak pressure: 49 mm Hg (S).  Patient Profile     83 y.o. female with tachycardia due to atrial fibrillation rapid ventricular response, but also with episodes of transient AV block with pauses of up to 5 seconds.  Assessment & Plan     1. AFib (likeltpersistent/premanent)     CHA2DS2Vasc is 4, on Xarelto, appropriately dosed     HR seems to be somewhat improved, no concerns regarding nocturnal pause  2. Likely some degree of tachy-brady with reports of pauses     Given significant malnutrition/frail body habitus, overall poor prognosis, not felt to be a PPM candidate     Has not been ambulatory now for some time with progressive functional decline     On metoprolol 25mg  BID and Verapamil 180mg  QD     Monitor today without change  3. Chronic lung disease      Home O2  4. Chronic pancreatitis 5. Dementia      Has home CNA Significant functional decline over the last couple months reported by home CNA at bedsied with a couple hospitalizations Medicine team has begin discussions regarding palliative care with niece, no decisions as of yet   Agree with palliative discussions,  Dr. Lovena Le will see again today     For questions or  updates, please contact [facility] Please consult [email or web address] for contact info under Cardiology/STEMI.      Signed, Baldwin [place], PA-C  06/18/2017, 7:45 AM    EP Attending  Patient seen and examined. Her rates are still not optimal but probably close to the best we can do. Overall she has failed to thrive, and will almost certainly not get better. I think palliative care is best next approach and home hospice or SNF. At 75 lbs, and multiple other comorbidities, a PPM will not change her clinical course. She almost certainly has weeks or months to live and not more.   Mikle Bosworth.D.

## 2017-06-18 NOTE — Progress Notes (Signed)
At about 2355 patient had a 2.07 second pause, patient asymptomatic.  Vital signs stable (see vitals charted at 0000).  Will continue to monitor.

## 2017-06-19 ENCOUNTER — Ambulatory Visit: Payer: Medicare Other | Admitting: Adult Health

## 2017-06-19 DIAGNOSIS — I509 Heart failure, unspecified: Secondary | ICD-10-CM

## 2017-06-19 LAB — GLUCOSE, CAPILLARY
GLUCOSE-CAPILLARY: 108 mg/dL — AB (ref 65–99)
GLUCOSE-CAPILLARY: 229 mg/dL — AB (ref 65–99)
GLUCOSE-CAPILLARY: 79 mg/dL (ref 65–99)
Glucose-Capillary: 123 mg/dL — ABNORMAL HIGH (ref 65–99)

## 2017-06-19 MED ORDER — METOPROLOL TARTRATE 50 MG PO TABS
50.0000 mg | ORAL_TABLET | Freq: Two times a day (BID) | ORAL | Status: DC
  Administered 2017-06-19 – 2017-06-20 (×2): 50 mg via ORAL
  Filled 2017-06-19 (×2): qty 1

## 2017-06-19 MED ORDER — GUAIFENESIN-DM 100-10 MG/5ML PO SYRP: 5.0000 mL | ORAL_SOLUTION | ORAL | Status: DC | PRN

## 2017-06-19 NOTE — Consult Note (Signed)
   [facility] CM Inpatient Consult   06/19/2017  [name] Apr 09, 1929 [id number]   Chart reviewed for Medicare ACO.for re-admission. Chart review reveals that this patient will discharge with [facility]. No [facility] Care Management would be appropriate and will receive full care management with Hospice.  Natividad Brood, RN BSN [place] [facility] Liaison  [phone] business mobile phone Toll free office [phone]

## 2017-06-19 NOTE — Care Management Note (Addendum)
Case Management Note  Patient Details  Name: [name] MRN: [id number] Date of Birth: 05-27-1929  Subjective/Objective:   FTT, chronic respiratory failure, afib/aflutter, CKD                 Action/Plan: Discharge Planning: NCM spoke to pt and caregiver at bedside. Gave permission to contact niece, [name] [facility]. Offered choice for Home Hospice. She requested [facility]. Contacted Hospice with new referral. Requested NCM fax orders, notes and facesheet to [phone]. Pt has oxygen, RW and bedside commode. Niece is not requesting a hospital bed. Caregiver will transport home at dc.   Will need Gold DNR form to go with pt at discharge. NCM please fax dc summary to [facility] [phone] at time of dc.   PCP [name] Lange MD  Expected Discharge Date:                Expected Discharge Plan:  [place] Referral:  NA  Discharge planning Services  CM Consult  Post Acute Care Choice:  Hospice Choice offered to:  [facility] POA / Guardian  DME Arranged:  N/A DME Agency:  NA  HH Arranged:  NA HH Agency:  [facility]  Status of Service:  Completed, signed off  If discussed at [place] of Stay Meetings, dates discussed:  06/19/17  Additional Comments: 06/19/2017 1533 Pt was approved for Hospice with [facility]. Contacted attending to make aware for dc today. Hospice RN can do a start of care on 06/20/2017. Jonnie Finner RN CCM Case Mgmt phone [phone]  Erenest Rasher, RN 06/19/2017, 12:41 PM

## 2017-06-19 NOTE — Progress Notes (Signed)
Nutrition Brief Note  Chart reviewed. Palliative Care team is following with plans for d/c home with Hospice care with focus on making her comfortable. No further nutrition interventions warranted at this time.  Please re-consult as needed.   Molli Barrows, RD, LDN, [place] Pager [phone] After Hours Pager [phone]

## 2017-06-19 NOTE — Progress Notes (Signed)
Physical Therapy Treatment Patient Details Name: [name] MRN: [id number] DOB: Aug 06, 1928 Today's Date: 06/19/2017    History of Present Illness Pt is an 83 y.o. female admitted with tachy-brady syndrome. Pt had recent admission for same dx. PMH consists of anemia, cavitary lung disease, lung mass, and s/p colostomy.     PT Comments    Pt was experiencing HR's at 140 at times, variable and volatile and deferred PT as a result.  Her tolerance for bed ex was minimal as the minor activity associated with bed ex also drove up her rates.  Will follow her for strength and gait as tolerated, discharging home on Hospice care when arranged.   Follow Up Recommendations  Home health PT;Supervision/Assistance - 24 hour;Supervision for mobility/OOB     Equipment Recommendations  None recommended by PT    Recommendations for Other Services       Precautions / Restrictions Precautions Precaution Comments: monitor HR Restrictions Weight Bearing Restrictions: No    Mobility  Bed Mobility                  Transfers                    Ambulation/Gait                 Stairs            Wheelchair Mobility    Modified Rankin (Stroke Patients Only)       Balance                                            Cognition Arousal/Alertness: Awake/alert Behavior During Therapy: WFL for tasks assessed/performed Overall Cognitive Status: Within Functional Limits for tasks assessed                                 General Comments: very pleasant       Exercises General Exercises - Lower Extremity Ankle Circles/Pumps: AROM;Both;5 reps Quad Sets: AROM;Both;10 reps Gluteal Sets: AROM;Both;10 reps Heel Slides: AROM;Both;20 reps Hip ABduction/ADduction: AROM;Both;20 reps Hip Flexion/Marching: AROM;Both;10 reps    General Comments        Pertinent Vitals/Pain Pain Assessment: No/denies pain    Home Living                       Prior Function            PT Goals (current goals can now be found in the care plan section) Acute Rehab PT Goals Patient Stated Goal: get stronger PT Goal Formulation: With patient Progress towards PT goals: Progressing toward goals    Frequency    Min 3X/week      PT Plan Current plan remains appropriate    Co-evaluation              AM-PAC PT "6 Clicks" Daily Activity  Outcome Measure  Difficulty turning over in bed (including adjusting bedclothes, sheets and blankets)?: A Little Difficulty moving from lying on back to sitting on the side of the bed? : A Little Difficulty sitting down on and standing up from a chair with arms (e.g., wheelchair, bedside commode, etc,.)?: A Little Help needed moving to and from a bed to chair (including a wheelchair)?: A Little Help needed walking in hospital room?:  A Little Help needed climbing 3-5 steps with a railing? : A Little 6 Click Score: 18    End of Session Equipment Utilized During Treatment: Oxygen Activity Tolerance: Patient tolerated treatment well Patient left: in bed;with call bell/phone within reach;with family/visitor present;with nursing/sitter in room Nurse Communication: Mobility status PT Visit Diagnosis: Unsteadiness on feet (R26.81);Difficulty in walking, not elsewhere classified (R26.2);Muscle weakness (generalized) (M62.81)     Time: 3343-5686 PT Time Calculation (min) (ACUTE ONLY): 19 min  Charges:  $Therapeutic Exercise: 8-22 mins                    G Codes:  Functional Assessment Tool Used: AM-PAC 6 Clicks Basic Mobility     Ramond Dial 06/19/2017, 3:06 PM   Mee Hives, PT MS Acute Rehab Dept. Number: [place] and [place]

## 2017-06-19 NOTE — Progress Notes (Addendum)
NCM contacted HCPOA, [name] via phone # [phone] to explain HINN 12, HCPOA wanted to speak to attending. Attending notified. Jonnie Finner RN CCM Case Mgmt phone [phone]

## 2017-06-19 NOTE — Progress Notes (Signed)
[facility] PROGRESS NOTE    Progress Note  [name]  [name]     Brief Narrative:   [name] is an 83 y.o. female past medical history of tachybradycardia syndrome recently discharged from the hospital, admitted on 06/12/2017 for shortness of breath and weakness, who came into the hospital for shortness of breath and weakness, recently discharged with tachybradycardia syndrome during this time it was difficult to adjust her medication due to long pauses more than 5 seconds when the dose was increased, cardiology was consulted during this hospitalization she was discharged on metoprolol 12.5 twice a day, Cardizem CD 360, comes into the hospital for the above-mentioned symptoms and was found to be in A. fib/A flutter with RVR EP was consulted who felt she was not a candidate for PPM  Assessment/Plan:   A. fib/A flutter with RVR: On admission she was started on IV diltiazem plus metoprolol, transferred to [name] EP evaluated the patient. She is now on p.o. verapamil and oral metoprolol which are being titrated She continues to be on Xarelto. Due to her severe caloric malnutrition and frail body habitus EP commented she has an overall poor prognosis and not a candidate for PPM. Palliative care was consulted, the patient became a DNR/DNI, will go home on hospice of [facility]. The also recommended oral morphine concentrated for shortness of breath and anxiety Namenda has been discontinued.  Chronic respiratory failure with hypoxemia: She chronically uses 2 L of oxygen which has been stable.  Adult failure to thrive: Multifactorial due to advanced disease and multiple medical complex conditions. It was discussed with healthcare power of attorney and patient.  Acute kidney injury on chronic kidney disease stage III: With a baseline creatinine of 1.0 likely due to overdiuresis now resolved.  Protein  caloric malnutrition: Nutritions was consulted, continue Ensure 3 times daily.  Hypothyroidism: Continue Synthroid.  Hypomagnesimia: Was repleted IV, now resolved.  DNR (do not resuscitate)/Encounter for hospice care discussion   DVT prophylaxis: Xarelto Family Communication:none Disposition Plan/Barrier to D/C: home with residential hospice hopefully tomorrow. Code Status:     Code Status Orders  (From admission, onward)        Start     Ordered   06/18/17 1015  Do not attempt resuscitation (DNR)  Continuous    Question Answer Comment  In the event of cardiac or respiratory ARREST Do not call a "code blue"   In the event of cardiac or respiratory ARREST Do not perform Intubation, CPR, defibrillation or ACLS   In the event of cardiac or respiratory ARREST Use medication by any route, position, wound care, and other measures to relive pain and suffering. May use oxygen, suction and manual treatment of airway obstruction as needed for comfort.      06/18/17 1014    Code Status History    Date Active Date Inactive Code Status Order ID Comments User Context   06/12/2017 15:45 06/18/2017 10:14 Full Code [id number]  [name] Hau, [name] Inpatient   05/26/2017 16:54 06/04/2017 16:38 Full Code [id number]  [name] Lever, [name] ED   06/10/2016 00:59 06/10/2016 19:45 DNR [id number]  [name] Bulls, [name] ED   12/19/2015 19:42 12/22/2015 21:51 DNR [id number]  [name] Hillock, [name] Inpatient   12/18/2015 18:12 12/19/2015 19:42 Full Code [id number]  [name] Gravel, [name] Inpatient   01/15/2015 04:49 01/20/2015 18:49 Full Code [id number]  [name] Hillock, [name] Inpatient  Advance Directive Documentation     Most Recent Value  Type of Advance Directive  Healthcare Power of Attorney  Pre-existing out of facility DNR order (yellow form or pink MOST form)  No data  "MOST" Form in Place?  No data        IV Access:    Peripheral IV   Procedures and diagnostic studies:   No results found.   Medical  Consultants:    None.  Anti-Infectives:   none  Subjective:    [name] relates feels great no new complaints.  Objective:    Vitals:   06/18/17 2057 06/19/17 0437 06/19/17 0452 06/19/17 0808  BP: 136/84 (!) 145/98    Pulse: (!) 122 (!) 113  (!) 119  Resp: (!) 22     Temp: 98.3 F (36.8 C) 98.6 F (37 C)    TempSrc: Oral Oral    SpO2: 98% 99%    Weight:   34 kg (75 lb)   Height:        Intake/Output Summary (Last 24 hours) at 06/19/2017 0927 Last data filed at 06/19/2017 0450 Gross per 24 hour  Intake 476 ml  Output 1102 ml  Net -626 ml   Filed Weights   06/17/17 0441 06/18/17 0420 06/19/17 0452  Weight: 34.3 kg (75 lb 9.6 oz) 34.2 kg (75 lb 6.4 oz) 34 kg (75 lb)    Exam: General exam: In no acute distress, cachectic Respiratory system: Good air movement and clear to auscultation. Cardiovascular system: S1 & S2 heard, RRR.  Gastrointestinal system: Abdomen is nondistended, soft and nontender.  Central nervous system: Alert and oriented. No focal neurological deficits. Extremities: No pedal edema. Skin: No rashes, lesions or ulcers    Data Reviewed:    Labs: Basic Metabolic Panel: Recent Labs  Lab 06/13/17 0447 06/15/17 0359 06/16/17 0408 06/18/17 0414  NA 136 136 138 139  K 3.6 3.8 4.0 4.4  CL 92* 98* 102 94*  CO2 33* 31 30 36*  GLUCOSE 161* 128* 119* 122*  BUN 19 31* 21* 20  CREATININE 1.31* 1.45* 1.19* 1.02*  CALCIUM 8.6* 9.1 8.7* 9.7  MG  --  1.6* 1.7 1.6*   GFR Estimated Creatinine Clearance: 21.3 mL/min (A) (by C-G formula based on SCr of 1.02 mg/dL (H)). Liver Function Tests: Recent Labs  Lab 06/18/17 0414  AST 19  ALT 15  ALKPHOS 95  BILITOT 0.6  PROT 6.8  ALBUMIN 3.0*   No results for input(s): LIPASE, AMYLASE in the last 168 hours. No results for input(s): AMMONIA in the last 168 hours. Coagulation profile No results for input(s): INR, PROTIME in the last 168 hours.  CBC: Recent Labs  Lab 06/13/17 0447  06/15/17 0359 06/18/17 0414  WBC 16.2* 13.5* 14.7*  NEUTROABS  --   --  8.3*  HGB 11.7* 12.3 12.0  HCT 37.2 39.1 38.2  MCV 97.9 98.0 99.2  PLT 352 394 389   Cardiac Enzymes: No results for input(s): CKTOTAL, CKMB, CKMBINDEX, TROPONINI in the last 168 hours. BNP (last 3 results) No results for input(s): PROBNP in the last 8760 hours. CBG: Recent Labs  Lab 06/18/17 0817 06/18/17 1126 06/18/17 1643 06/18/17 2102 06/19/17 0746  GLUCAP 123* 133* 147* 183* 123*   D-Dimer: No results for input(s): DDIMER in the last 72 hours. Hgb A1c: No results for input(s): HGBA1C in the last 72 hours. Lipid Profile: No results for input(s): CHOL, HDL, LDLCALC, TRIG, CHOLHDL, LDLDIRECT in the last 72 hours. Thyroid  function studies: No results for input(s): TSH, T4TOTAL, T3FREE, THYROIDAB in the last 72 hours.  Invalid input(s): FREET3 Anemia work up: No results for input(s): VITAMINB12, FOLATE, FERRITIN, TIBC, IRON, RETICCTPCT in the last 72 hours. Sepsis Labs: Recent Labs  Lab 06/13/17 0447 06/15/17 0359 06/18/17 0414  WBC 16.2* 13.5* 14.7*   Microbiology No results found for this or any previous visit (from the past 240 hour(s)).   Medications:   . citalopram  10 mg Oral Daily  . diazepam  2 mg Oral QHS  . insulin aspart  0-9 Units Subcutaneous TID WC  . levothyroxine  25 mcg Oral QAC breakfast  . lipase/protease/amylase  12,000 Units Oral TID AC  . magnesium oxide  200 mg Oral Daily  . mouth rinse  15 mL Mouth Rinse BID  . metoCLOPramide  5 mg Oral TID AC & HS  . metoprolol tartrate  25 mg Oral BID  . protein supplement  1 scoop Oral TID BM  . Rivaroxaban  15 mg Oral Q supper  . sodium chloride flush  3 mL Intravenous Q12H  . venlafaxine XR  37.5 mg Oral Q breakfast  . verapamil  180 mg Oral Q1400   Continuous Infusions: . sodium chloride       LOS: 7 days   [place] [facility] Pager [phone]  *Please refer to [email or web address], password [email or web address] to  get updated schedule on who will round on this patient, as hospitalists switch teams weekly. If 7PM-7AM, please contact night-coverage at [email or web address], password [email or web address] for any overnight needs.  06/19/2017, 9:27 AM

## 2017-06-20 DIAGNOSIS — Z66 Do not resuscitate: Secondary | ICD-10-CM

## 2017-06-20 DIAGNOSIS — Z7189 Other specified counseling: Secondary | ICD-10-CM

## 2017-06-20 LAB — GLUCOSE, CAPILLARY
GLUCOSE-CAPILLARY: 87 mg/dL (ref 65–99)
GLUCOSE-CAPILLARY: 123 mg/dL — AB (ref 65–99)
GLUCOSE-CAPILLARY: 254 mg/dL — AB (ref 65–99)

## 2017-06-20 MED ORDER — VERAPAMIL HCL ER 180 MG PO TBCR: 180.0000 mg | EXTENDED_RELEASE_TABLET | Freq: Every day | ORAL | 0 refills | Status: AC

## 2017-06-20 MED ORDER — METOPROLOL TARTRATE 50 MG PO TABS: 50.0000 mg | ORAL_TABLET | Freq: Two times a day (BID) | ORAL | 0 refills | Status: AC

## 2017-06-20 MED ORDER — MORPHINE SULFATE (CONCENTRATE) 10 MG/0.5ML PO SOLN: 5.0000 mg | ORAL | 0 refills | Status: AC | PRN

## 2017-06-20 NOTE — Discharge Summary (Addendum)
Physician Discharge Summary  [name] OHY:[id number] DOB: January 09, 1929 DOA: 06/12/2017  PCP: Kathyrn Drown, MD  Admit date: 06/12/2017 Discharge date: 06/20/2017  Admitted From: home Disposition:  Home  Recommendations for Outpatient Follow-up:  1. [facility] to follow up at home  Home Health:Yes Equipment/Devices:none  Discharge Condition:stable CODE STATUS:DNR Diet recommendation: Heart Healthy  Brief/Interim Summary: 83 y.o. female past medical history of tachybradycardia syndrome recently discharged from the hospital, admitted on 06/12/2017 for shortness of breath and weakness, who came into the hospital for shortness of breath and weakness, recently discharged with tachybradycardia syndrome during this time it was difficult to adjust her medication due to long pauses more than 5 seconds when the dose was increased, cardiology was consulted during this hospitalization she was discharged on metoprolol 12.5 twice a day, Cardizem CD 360, comes into the hospital for the above-mentioned symptoms and was found to be in A. fib/A flutter    Discharge Diagnoses:  Principal Problem:   Tachy-brady syndrome ([place]) Active Problems:   Gastroparesis   Osteoporosis   Ileostomy in place [facility])   Atrial fibrillation with rapid ventricular response ([place])   Chronic respiratory failure with hypoxia ([place])   Tachycardia   Acute on chronic congestive heart failure ([place])   Palliative care encounter   DNR (do not resuscitate)   Encounter for hospice care discussion  A. fib/A flutter with RVR: On admission she was started on IV diltiazem plus metoprolol, transferred to [facility] EP evaluated the patient. EP was consulted. Due to her severe caloric malnutrition and frail body habitus EP commented she has an overall poor prognosis and not a candidate for PPM. Palliative care was consulted, the patient became a DNR/DNI, will [facility] [place] [facility]. She is now on p.o.  verapamil and oral metoprolol, with a heart rate persistently around 110. She continues to be on Xarelto. The also recommended oral morphine  for shortness of breath and anxiety, Namenda has been discontinued.  Chronic respiratory failure with hypoxemia: She chronically uses 2 L of oxygen which has been stable.  Adult failure to thrive: Multifactorial due to advanced disease and multiple medical complex conditions. It was discussed with healthcare power of attorney and patient. And she agrees with plan will go home with hospice.  Acute kidney injury on chronic kidney disease stage III: With a baseline creatinine of 1.0 likely due to overdiuresis now resolved.  Protein caloric malnutrition: Nutritions was consulted, continue Ensure 3 times daily.  Hypothyroidism: Continue Synthroid.  Hypomagnesimia: Was repleted IV, now resolved.  DNR (do not resuscitate)/Encounter for hospice care discussion   Discharge Instructions  Discharge Instructions    Diet - low sodium heart healthy   Complete by:  As directed    Increase activity slowly   Complete by:  As directed      Allergies as of 06/20/2017      Reactions   Levaquin [levofloxacin] Other (See Comments)   Patient states that she became weak and extremely tired. Also, it made her real dizzy.   Aspirin Nausea And Vomiting   Nausea/vomiting blood   Tetanus Toxoid Hives, Swelling   Zolpidem Tartrate    Not in right state of mind   Fosamax [alendronate Sodium] Other (See Comments)   Severe gastritis reflux.      Medication List    STOP taking these medications   diltiazem 360 MG 24 hr capsule Commonly known as:  CARDIZEM CD   loperamide 2 MG capsule Commonly known as:  IMODIUM   potassium chloride 10 MEQ tablet Commonly known as:  K-DUR   ROBITUSSIN NIGHTTIME COUGH DM 12.5-30 MG/10ML Liqd Generic drug:  Doxylamine-DM     TAKE these medications   acetaminophen 325 MG tablet Commonly known as:   TYLENOL Take 2 tablets (650 mg total) by mouth every 6 (six) hours as needed for mild pain or headache (or Fever >/= 101).   chlorpheniramine-HYDROcodone 10-8 MG/5ML Suer Commonly known as:  TUSSIONEX PENNKINETIC ER Take 5 mLs by mouth every 12 (twelve) hours as needed for cough.   citalopram 10 MG tablet Commonly known as:  CELEXA TAKE (1) TABLET BY MOUTH ONCE DAILY.   diazepam 2 MG tablet Commonly known as:  VALIUM Take 1 tablets twice a daily as needed. Take sparingly (Prescription must last 30 days)   GAS RELIEF PO Take 1 tablet by mouth daily as needed (for gas relief).   ipratropium 0.03 % nasal spray Commonly known as:  ATROVENT Place 2 sprays into both nostrils every 12 (twelve) hours.   ipratropium 17 MCG/ACT inhaler Commonly known as:  ATROVENT HFA 2 PUFFS FOUR TIMES DAILY. What changed:    how much to take  how to take this  when to take this  additional instructions   levothyroxine 25 MCG tablet Commonly known as:  SYNTHROID, LEVOTHROID TAKE 2 TABLETS BY MOUTH ON [place]. & 1 TABLET ALL OTHER DAYS   lipase/protease/amylase 12000 units Cpep capsule Commonly known as:  CREON TAKE 2 CAPSULES BY MOUTH 3 TIMES DAILY WITH MEALS. TAKE 1 CAPSULE TWICE DAILY WITH SNACKS   Melatonin 5 MG Caps Take 5 mg by mouth at bedtime.   memantine 10 MG tablet Commonly known as:  NAMENDA TAKE 1 TABLET BY MOUTH TWICE DAILY.   metoCLOPramide 5 MG tablet Commonly known as:  REGLAN Take 1 tablet (5 mg total) by mouth 4 (four) times daily -  before meals and at bedtime.   metoprolol tartrate 50 MG tablet Commonly known as:  LOPRESSOR Take 1 tablet (50 mg total) by mouth 2 (two) times daily.   morphine CONCENTRATE 10 MG/0.5ML Soln concentrated solution Place 0.25 mLs (5 mg total) under the tongue every hour as needed for anxiety or shortness of breath.   mupirocin ointment 2 % Commonly known as:  BACTROBAN Apply to affected area 2 times daily   OXYGEN Inhale  into the lungs. 2 liters -Nasal Canual   Rivaroxaban 15 MG Tabs tablet Commonly known as:  XARELTO Take 1 tablet (15 mg total) by mouth daily with supper.   venlafaxine XR 37.5 MG 24 hr capsule Commonly known as:  EFFEXOR-XR TAKE 1 CAPSULE BY MOUTH ONCE DAILY WITH BREAKFAST   verapamil 180 MG CR tablet Commonly known as:  CALAN-SR Take 1 tablet (180 mg total) by mouth daily at 2 PM.      [facility] up.   Contact information: [address] [place] [address] [phone]          Allergies  Allergen Reactions  . Levaquin [Levofloxacin] Other (See Comments)    Patient states that she became weak and extremely tired. Also, it made her real dizzy.  . Aspirin Nausea And Vomiting    Nausea/vomiting blood  . Tetanus Toxoid Hives and Swelling  . Zolpidem Tartrate     Not in right state of mind  . Fosamax [Alendronate Sodium] Other (See Comments)    Severe gastritis reflux.    Consultations:  Cardiology EP   Procedures/Studies: Dg Chest 2 View  Result Date: 05/26/2017 CLINICAL DATA:  Chest pain.  Cavitary lung disease. EXAM: CHEST  2 VIEW COMPARISON:  04/22/2017 FINDINGS: Heart size remains normal. Chronic aortic atherosclerosis. Chronic lung disease with scarring and cavity formation as seen previously. Pulmonary density appears similar. No sign of consolidation or fluid accumulation within the cystic areas. No pleural effusions. No acute bone finding. IMPRESSION: Advanced chronic lung disease with scarring and areas of cavitation. No sign of identifiable acute inflammation. A would certainly be possible for minor inflammatory changes to be hidden amongst the chronic densities. Electronically Signed   By: Nelson Chimes M.D.   On: 05/26/2017 13:10   Dg Esophagus  Result Date: 06/02/2017 CLINICAL DATA:  83 year old female with history of dysphagia. Excessive belching. EXAM: ESOPHOGRAM / BARIUM SWALLOW / BARIUM TABLET STUDY  TECHNIQUE: Single contrast examination was performed using thin barium or water soluble. The patient was observed with fluoroscopy swallowing a 13 mm barium sulphate tablet. FLUOROSCOPY TIME:  Fluoroscopy Time:  2 minutes and 54 seconds Radiation Exposure Index (if provided by the fluoroscopic device): 6.2 mGy COMPARISON:  None. FINDINGS: Single contrast images of the esophagus demonstrate grossly abnormal esophageal motility with failure to propagate a normal primary peristaltic wave and extensive tertiary contractions. Distal esophagus appears dilated shortly above the gastroesophageal junction. Gastroesophageal junction appeared narrowed, with some adjacent surgical clips, which could indicate prior fundoplication procedure. Importantly, however, there was a persistent filling defect in the distal esophagus immediately above the gastroesophageal junction which could represent an underlying mass, or may simply reflect retained food products. A barium tablet was administered which failed to pass beyond this area of narrowing. IMPRESSION: 1. Nonspecific esophageal motility disorder with failure to propagate normal primary peristaltic waves and extensive tertiary contractions. 2. Narrowing of the esophagus in the region of the gastroesophageal junction. There are findings which could suggest prior fundoplication, however, the patient's surgical history is uncertain. Regardless, there is a persistent filling defect immediately above the gastroesophageal junction, concerning for potential neoplasm versus retained food products. Correlation with endoscopy is recommended if clinically appropriate. Electronically Signed   By: Vinnie Langton M.D.   On: 06/02/2017 12:30   Dg Chest Port 1 View  Result Date: 06/12/2017 CLINICAL DATA:  Shortness of breath for 3 days, dizziness and weakness. Wheezing. EXAM: PORTABLE CHEST 1 VIEW COMPARISON:  Chest radiograph May 26, 2017. CT chest April 30, 2017 FINDINGS: Cardiac  silhouette is mildly enlarged and unchanged. Calcified aortic knob. Increased lung volumes with diffusely coarsened interstitium, small bilateral pleural effusions RIGHT upper lobe cavitation and scarring with additional small cavitary nodules better seen on prior CT. Scattered parenchymal calcifications. No pneumothorax. Widened at LEFT acromioclavicular joint space may be postoperative. Surgical clips projected GE junction. IMPRESSION: Increasing small pleural effusions. Pleuroparenchymal scarring better characterized April 30, 2017, superimposed pneumonia possible. Mild cardiomegaly. Aortic Atherosclerosis (ICD10-I70.0). Electronically Signed   By: Elon Alas M.D.   On: 06/12/2017 03:37   Dg Swallowing Func-speech Pathology  Result Date: 05/29/2017 Objective Swallowing Evaluation: Type of Study: MBS-Modified Barium Swallow Study  Patient Details Name: BERENIZE GATLIN MRN: [id number] Date of Birth: 05/01/1929 Today's Date: 05/29/2017 Time: SLP Start Time (ACUTE ONLY): 1100 -SLP Stop Time (ACUTE ONLY): 1130 SLP Time Calculation (min) (ACUTE ONLY): 30 min Past Medical History: Past Medical History: Diagnosis Date . Anemia  . Cavitary lung disease 03/15/2015 . Chronic abdominal pain  . Chronic neck and back pain  . COPD (chronic obstructive pulmonary  disease) ([place])  . GAD (generalized anxiety disorder)  . Gastroesophageal reflux disease  . Gastroparesis  . IBS (irritable bowel syndrome)  . Lung mass  . Migraines  . Osteoporosis  . 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 Renal insufficiency  . S/P colostomy ([place])  . Tachycardia  Past Surgical History: Past Surgical History: Procedure Laterality Date . ABDOMINAL HYSTERECTOMY   . APPENDECTOMY   . CHOLECYSTECTOMY   . COLOSTOMY   . ESOPHAGOGASTRODUODENOSCOPY   . ESOPHAGOGASTRODUODENOSCOPY N/A 08/25/2015  Procedure: ESOPHAGOGASTRODUODENOSCOPY (EGD);  Surgeon: Rogene Houston, MD;  Location: [facility];  Service: Endoscopy;  Laterality: N/A;  730 . ILEOSCOPY N/A 08/25/2015   Procedure: ILEOSCOPY THROUGH STOMA;  Surgeon: Rogene Houston, MD;  Location: [facility];  Service: Endoscopy;  Laterality: N/A; . PARTIAL GASTRECTOMY  1967  3/4 gastric resection for bleeding ulcers . TONSILLECTOMY   HPI: 83 year old female with a history of cavitary lung disease, hypothyroidism, admitted to the hospital with rapid atrial fibrillation. She had increasing palpitations and increased work of breathing for 4-5 days prior to admission. She was admitted to the stepdown unit, started on a Cardizem infusion. Cardiology following. Pt was schedule for outpatient MBSS on 05/26/17, but was admitted to hospital before it could be completed. MBSS ordered as inpatient by Dr. Bronson Ing.  Subjective: "I feel like I may throw up." She had ileostomy several years ago for colonic obstruction secondary to Alosetron and extensive adhesions. She has remote history of TB and cavitary lung disease UGI with small bowel 08/14/2015: Esophageal dysmotility compatible with age. Prior subtotal gastrectomy with small gastric pouch. Focal narrowing at the gastrojejunostomy anastomosis, long and tapered proximally with shouldering distally raising question ofmass/tumor ; endoscopic assessment recommended. She therefore underwent EGD and ileoscopy on 08/25/2015. EGD revealed small sliding hiatal hernia and mild changes of reflux esophagitis limited gastric junction. Small gastric pouch with patent gastro-jejunostomy. Nonspecific finding of jejunal mucosal edema but biopsy was unremarkable. Current chest x-ray: Advanced chronic lung disease with scarring and areas of cavitation. Assessment / Plan / Recommendation CHL IP CLINICAL IMPRESSIONS 05/29/2017 Clinical Impression Pt presents with mild/mod oral phase dysphagia characterized by impaired mastication due to edentulous status and weak lingual manipulation resulting in prolonged oral phase and piecemeal deglutition. Pharyngeal phase is essentially WNL with flash penetration  of thins during sequential cup sips with immediate removal and no aspiration observed. Suspect primary esophageal phase dysphagia as Pt noted to have distended, air-filled esophagus. Pt immediately vomited following sips of thin barium (started as belching and then vomited in four heaves- appeared to be breakfast and medication). MBSS was somewhat limited in intake due to Pt belching, regurgitation, and vomiting. Esophageal sweep completed several times throughout the study revealed esophageal distention and bird-beaking (this occurred after liquids appeared to backflow from stomach into esophagus). Would like radiologist to review imaging (Series #11, 12, and 13 labeled as cup thin sweep). Recommend D3/mech soft and thin liquids. Pt already consumes frequent, small meals due to previous gastrectomy. Pt appears at highest risk for post prandial aspiration at this time given regurgitation and suspected primary esophageal dysphagia. Reflux precautions should be adhered to.  SLP Visit Diagnosis Dysphagia, oral phase (R13.11);Dysphagia, pharyngoesophageal phase (R13.14) Attention and concentration deficit following -- Frontal lobe and executive function deficit following -- Impact on safety and function Mild aspiration risk;Risk for inadequate nutrition/hydration   CHL IP TREATMENT RECOMMENDATION 05/29/2017 Treatment Recommendations No treatment recommended at this time   Prognosis 05/29/2017 Prognosis for Safe Diet Advancement Fair Barriers to Reach  Goals (No Data) Barriers/Prognosis Comment -- CHL IP DIET RECOMMENDATION 05/29/2017 SLP Diet Recommendations Dysphagia 3 (Mech soft) solids;Thin liquid Liquid Administration via Cup Medication Administration Whole meds with puree Compensations Slow rate Postural Changes Remain semi-upright after after feeds/meals (Comment);Seated upright at 90 degrees   CHL IP OTHER RECOMMENDATIONS 05/29/2017 Recommended Consults Consider GI evaluation;Consider esophageal assessment Oral  Care Recommendations Oral care BID Other Recommendations Clarify dietary restrictions   CHL IP FOLLOW UP RECOMMENDATIONS 05/29/2017 Follow up Recommendations None   CHL IP FREQUENCY AND DURATION 12/21/2015 Speech Therapy Frequency (ACUTE ONLY) min 1 x/week Treatment Duration 1 week      CHL IP ORAL PHASE 05/29/2017 Oral Phase Impaired Oral - Pudding Teaspoon -- Oral - Pudding Cup -- Oral - Honey Teaspoon -- Oral - Honey Cup -- Oral - Nectar Teaspoon -- Oral - Nectar Cup -- Oral - Nectar Straw -- Oral - Thin Teaspoon -- Oral - Thin Cup -- Oral - Thin Straw -- Oral - Puree -- Oral - Mech Soft Impaired mastication;Weak lingual manipulation;Piecemeal swallowing;Delayed oral transit Oral - Regular -- Oral - Multi-Consistency -- Oral - Pill -- Oral Phase - Comment --  CHL IP PHARYNGEAL PHASE 05/29/2017 Pharyngeal Phase Impaired Pharyngeal- Pudding Teaspoon -- Pharyngeal -- Pharyngeal- Pudding Cup -- Pharyngeal -- Pharyngeal- Honey Teaspoon -- Pharyngeal -- Pharyngeal- Honey Cup -- Pharyngeal -- Pharyngeal- Nectar Teaspoon -- Pharyngeal -- Pharyngeal- Nectar Cup -- Pharyngeal -- Pharyngeal- Nectar Straw -- Pharyngeal -- Pharyngeal- Thin Teaspoon -- Pharyngeal -- Pharyngeal- Thin Cup Delayed swallow initiation-vallecula;Penetration/Aspiration during swallow Pharyngeal Material does not enter airway;Material enters airway, remains ABOVE vocal cords then ejected out Pharyngeal- Thin Straw -- Pharyngeal -- Pharyngeal- Puree WFL;Delayed swallow initiation-vallecula Pharyngeal -- Pharyngeal- Mechanical Soft Delayed swallow initiation-vallecula;WFL Pharyngeal -- Pharyngeal- Regular -- Pharyngeal -- Pharyngeal- Multi-consistency -- Pharyngeal -- Pharyngeal- Pill WFL Pharyngeal -- Pharyngeal Comment Essentially WNL for age  CHL IP CERVICAL ESOPHAGEAL PHASE 05/29/2017 Cervical Esophageal Phase Impaired Pudding Teaspoon -- Pudding Cup -- Honey Teaspoon -- Honey Cup -- Nectar Teaspoon -- Nectar Cup -- Nectar Straw -- Thin Teaspoon --  Thin Cup Other (Comment) Thin Straw -- Puree -- Mechanical Soft -- Regular -- Multi-consistency -- Pill -- Cervical Esophageal Comment -- No flowsheet data found. PORTER,DABNEY 05/29/2017, 6:38 PM                 Subjective: No complains  Discharge Exam: Vitals:   06/20/17 0515 06/20/17 0844  BP: (!) 142/80 102/69  Pulse: (!) 118 (!) 120  Resp:    Temp: 98 F (36.7 C)   SpO2: 100% 96%   Vitals:   06/19/17 2121 06/20/17 0515 06/20/17 0535 06/20/17 0844  BP: 110/64 (!) 142/80  102/69  Pulse: 62 (!) 118  (!) 120  Resp: 20     Temp: 98.7 F (37.1 C) 98 F (36.7 C)    TempSrc: Oral Oral    SpO2: 97% 100%  96%  Weight:   33.9 kg (74 lb 12.8 oz)   Height:        General: Pt is alert, awake, not in acute distress Cardiovascular: RRR, S1/S2 +, no rubs, no gallops Respiratory: CTA bilaterally, no wheezing, no rhonchi Abdominal: Soft, NT, ND, bowel sounds + Extremities: no edema, no cyanosis    The results of significant diagnostics from this hospitalization (including imaging, microbiology, ancillary and laboratory) are listed below for reference.     Microbiology: No results found for this or any previous visit (from the past 240 hour(s)).   Labs: BNP (  last 3 results) Recent Labs    05/26/17 1703 06/12/17 0347  BNP 1,803.0* 0,932.6*   Basic Metabolic Panel: Recent Labs  Lab 06/15/17 0359 06/16/17 0408 06/18/17 0414  NA 136 138 139  K 3.8 4.0 4.4  CL 98* 102 94*  CO2 31 30 36*  GLUCOSE 128* 119* 122*  BUN 31* 21* 20  CREATININE 1.45* 1.19* 1.02*  CALCIUM 9.1 8.7* 9.7  MG 1.6* 1.7 1.6*   Liver Function Tests: Recent Labs  Lab 06/18/17 0414  AST 19  ALT 15  ALKPHOS 95  BILITOT 0.6  PROT 6.8  ALBUMIN 3.0*   No results for input(s): LIPASE, AMYLASE in the last 168 hours. No results for input(s): AMMONIA in the last 168 hours. CBC: Recent Labs  Lab 06/15/17 0359 06/18/17 0414  WBC 13.5* 14.7*  NEUTROABS  --  8.3*  HGB 12.3 12.0  HCT 39.1  38.2  MCV 98.0 99.2  PLT 394 389   Cardiac Enzymes: No results for input(s): CKTOTAL, CKMB, CKMBINDEX, TROPONINI in the last 168 hours. BNP: Invalid input(s): POCBNP CBG: Recent Labs  Lab 06/19/17 1139 06/19/17 1610 06/19/17 2239 06/20/17 0203 06/20/17 0751  GLUCAP 108* 229* 79 87 123*   D-Dimer No results for input(s): DDIMER in the last 72 hours. Hgb A1c No results for input(s): HGBA1C in the last 72 hours. Lipid Profile No results for input(s): CHOL, HDL, LDLCALC, TRIG, CHOLHDL, LDLDIRECT in the last 72 hours. Thyroid function studies No results for input(s): TSH, T4TOTAL, T3FREE, THYROIDAB in the last 72 hours.  Invalid input(s): FREET3 Anemia work up No results for input(s): VITAMINB12, FOLATE, FERRITIN, TIBC, IRON, RETICCTPCT in the last 72 hours. Urinalysis    Component Value Date/Time   COLORURINE AMBER (A) 08/22/2016 1747   APPEARANCEUR CLEAR 08/22/2016 1747   LABSPEC 1.010 08/22/2016 1747   PHURINE 5.0 08/22/2016 1747   GLUCOSEU NEGATIVE 08/22/2016 1747   HGBUR NEGATIVE 08/22/2016 1747   BILIRUBINUR NEGATIVE 08/22/2016 1747   KETONESUR NEGATIVE 08/22/2016 1747   PROTEINUR NEGATIVE 08/22/2016 1747   UROBILINOGEN 0.2 08/30/2014 2021   NITRITE POSITIVE (A) 08/22/2016 1747   LEUKOCYTESUR NEGATIVE 08/22/2016 1747   Sepsis Labs Invalid input(s): PROCALCITONIN,  WBC,  LACTICIDVEN Microbiology No results found for this or any previous visit (from the past 240 hour(s)).   Time coordinating discharge: Over 30 minutes  SIGNED:   Charlynne Cousins, MD  [facility] 06/20/2017, 10:59 AM Pager   If 7PM-7AM, please contact night-coverage [email or web address] Password [email or web address]

## 2017-06-20 NOTE — Progress Notes (Addendum)
Patient with order to Dc home w hospice. [place] who will be at her house for intake appointment today at 2:00. Verified w niece that she will be home to accept patient and home for appointment time. Notified Elana RN that patient can be DC'd asap so she gets home in time. Care giver is in room who will provide transportation home. Portable O2 in room.  DC note faxed to hospice

## 2017-06-23 ENCOUNTER — Other Ambulatory Visit: Payer: Self-pay

## 2017-06-23 ENCOUNTER — Emergency Department (HOSPITAL_COMMUNITY)

## 2017-06-23 ENCOUNTER — Observation Stay (HOSPITAL_COMMUNITY)
Admission: EM | Admit: 2017-06-23 | Discharge: 2017-07-15 | Disposition: E | Attending: Internal Medicine | Admitting: Internal Medicine

## 2017-06-23 ENCOUNTER — Encounter (HOSPITAL_COMMUNITY): Payer: Self-pay | Admitting: *Deleted

## 2017-06-23 DIAGNOSIS — R627 Adult failure to thrive: Secondary | ICD-10-CM | POA: Diagnosis not present

## 2017-06-23 DIAGNOSIS — I481 Persistent atrial fibrillation: Secondary | ICD-10-CM | POA: Diagnosis not present

## 2017-06-23 DIAGNOSIS — N183 Chronic kidney disease, stage 3 unspecified: Secondary | ICD-10-CM

## 2017-06-23 DIAGNOSIS — N182 Chronic kidney disease, stage 2 (mild): Secondary | ICD-10-CM | POA: Diagnosis not present

## 2017-06-23 DIAGNOSIS — I509 Heart failure, unspecified: Secondary | ICD-10-CM | POA: Insufficient documentation

## 2017-06-23 DIAGNOSIS — Z932 Ileostomy status: Secondary | ICD-10-CM | POA: Diagnosis not present

## 2017-06-23 DIAGNOSIS — J449 Chronic obstructive pulmonary disease, unspecified: Secondary | ICD-10-CM | POA: Insufficient documentation

## 2017-06-23 DIAGNOSIS — Z79899 Other long term (current) drug therapy: Secondary | ICD-10-CM | POA: Diagnosis not present

## 2017-06-23 DIAGNOSIS — E1143 Type 2 diabetes mellitus with diabetic autonomic (poly)neuropathy: Secondary | ICD-10-CM | POA: Insufficient documentation

## 2017-06-23 DIAGNOSIS — I4891 Unspecified atrial fibrillation: Secondary | ICD-10-CM | POA: Diagnosis not present

## 2017-06-23 DIAGNOSIS — Z933 Colostomy status: Secondary | ICD-10-CM | POA: Insufficient documentation

## 2017-06-23 DIAGNOSIS — I4819 Other persistent atrial fibrillation: Secondary | ICD-10-CM

## 2017-06-23 DIAGNOSIS — I499 Cardiac arrhythmia, unspecified: Secondary | ICD-10-CM | POA: Diagnosis not present

## 2017-06-23 DIAGNOSIS — J9690 Respiratory failure, unspecified, unspecified whether with hypoxia or hypercapnia: Secondary | ICD-10-CM

## 2017-06-23 DIAGNOSIS — J9601 Acute respiratory failure with hypoxia: Secondary | ICD-10-CM | POA: Diagnosis present

## 2017-06-23 DIAGNOSIS — Z66 Do not resuscitate: Secondary | ICD-10-CM | POA: Insufficient documentation

## 2017-06-23 DIAGNOSIS — E039 Hypothyroidism, unspecified: Secondary | ICD-10-CM | POA: Diagnosis not present

## 2017-06-23 DIAGNOSIS — J9621 Acute and chronic respiratory failure with hypoxia: Principal | ICD-10-CM | POA: Insufficient documentation

## 2017-06-23 DIAGNOSIS — Z7901 Long term (current) use of anticoagulants: Secondary | ICD-10-CM | POA: Diagnosis not present

## 2017-06-23 DIAGNOSIS — I130 Hypertensive heart and chronic kidney disease with heart failure and stage 1 through stage 4 chronic kidney disease, or unspecified chronic kidney disease: Secondary | ICD-10-CM | POA: Insufficient documentation

## 2017-06-23 DIAGNOSIS — R0603 Acute respiratory distress: Secondary | ICD-10-CM | POA: Diagnosis present

## 2017-06-23 DIAGNOSIS — Z515 Encounter for palliative care: Secondary | ICD-10-CM

## 2017-06-23 DIAGNOSIS — R531 Weakness: Secondary | ICD-10-CM | POA: Diagnosis not present

## 2017-06-23 DIAGNOSIS — R0600 Dyspnea, unspecified: Secondary | ICD-10-CM | POA: Diagnosis present

## 2017-06-23 DIAGNOSIS — J849 Interstitial pulmonary disease, unspecified: Secondary | ICD-10-CM | POA: Diagnosis not present

## 2017-06-23 DIAGNOSIS — R680 Hypothermia, not associated with low environmental temperature: Secondary | ICD-10-CM | POA: Diagnosis not present

## 2017-06-23 DIAGNOSIS — R401 Stupor: Secondary | ICD-10-CM | POA: Diagnosis present

## 2017-06-23 DIAGNOSIS — I495 Sick sinus syndrome: Secondary | ICD-10-CM | POA: Diagnosis not present

## 2017-06-23 DIAGNOSIS — R001 Bradycardia, unspecified: Secondary | ICD-10-CM | POA: Diagnosis not present

## 2017-06-23 LAB — CBG MONITORING, ED: Glucose-Capillary: 242 mg/dL — ABNORMAL HIGH (ref 65–99)

## 2017-06-23 MED ORDER — LORAZEPAM 2 MG/ML IJ SOLN
1.0000 mg | INTRAMUSCULAR | Status: DC | PRN
  Administered 2017-06-23: 1 mg via INTRAVENOUS

## 2017-06-23 MED ORDER — INSULIN ASPART 100 UNIT/ML ~~LOC~~ SOLN
SUBCUTANEOUS | Status: AC
  Filled 2017-06-23: qty 1

## 2017-06-23 MED ORDER — MORPHINE SULFATE (PF) 4 MG/ML IV SOLN
4.0000 mg | Freq: Once | INTRAVENOUS | Status: AC
Start: 2017-06-23 — End: 2017-06-23
  Administered 2017-06-23: 4 mg via INTRAVENOUS
  Filled 2017-06-23: qty 1

## 2017-06-23 MED ORDER — HALOPERIDOL LACTATE 5 MG/ML IJ SOLN: 0.5000 mg | INTRAMUSCULAR | Status: DC | PRN

## 2017-06-23 MED ORDER — BIOTENE DRY MOUTH MT LIQD: 15.0000 mL | OROMUCOSAL | Status: DC | PRN

## 2017-06-23 MED ORDER — HALOPERIDOL 0.5 MG PO TABS: 0.5000 mg | ORAL_TABLET | ORAL | Status: DC | PRN

## 2017-06-23 MED ORDER — GLYCOPYRROLATE 0.2 MG/ML IJ SOLN
0.2000 mg | INTRAMUSCULAR | Status: DC | PRN
  Filled 2017-06-23: qty 1

## 2017-06-23 MED ORDER — MORPHINE SULFATE (PF) 2 MG/ML IV SOLN
INTRAVENOUS | Status: AC
  Filled 2017-06-23: qty 1

## 2017-06-23 MED ORDER — ONDANSETRON HCL 4 MG/2ML IJ SOLN: 4.0000 mg | Freq: Four times a day (QID) | INTRAMUSCULAR | Status: DC | PRN

## 2017-06-23 MED ORDER — ATROPINE SULFATE 1 MG/10ML IJ SOSY
1.0000 mg | PREFILLED_SYRINGE | Freq: Once | INTRAMUSCULAR | Status: AC
Start: 2017-06-23 — End: 2017-06-23
  Administered 2017-06-23: 1 mg via INTRAVENOUS

## 2017-06-23 MED ORDER — SODIUM BICARBONATE 8.4 % IV SOLN
50.0000 meq | Freq: Once | INTRAVENOUS | Status: AC
  Administered 2017-06-23: 50 meq via INTRAVENOUS

## 2017-06-23 MED ORDER — NALOXONE HCL 2 MG/2ML IJ SOSY
1.0000 mg | PREFILLED_SYRINGE | Freq: Once | INTRAMUSCULAR | Status: AC
Start: 2017-06-23 — End: 2017-06-23
  Administered 2017-06-23: 1 mg via INTRAVENOUS

## 2017-06-23 MED ORDER — EPINEPHRINE PF 1 MG/10ML IJ SOSY
1.0000 mg | PREFILLED_SYRINGE | Freq: Once | INTRAMUSCULAR | Status: AC
  Administered 2017-06-23: 1 mg via INTRAVENOUS

## 2017-06-23 MED ORDER — INSULIN ASPART 100 UNIT/ML ~~LOC~~ SOLN
10.0000 [IU] | Freq: Once | SUBCUTANEOUS | Status: AC
  Administered 2017-06-23: 10 [IU] via INTRAVENOUS

## 2017-06-23 MED ORDER — ACETAMINOPHEN 650 MG RE SUPP: 650.0000 mg | Freq: Four times a day (QID) | RECTAL | Status: DC | PRN

## 2017-06-23 MED ORDER — ATROPINE SULFATE 1 MG/10ML IJ SOSY
1.0000 mg | PREFILLED_SYRINGE | Freq: Once | INTRAMUSCULAR | Status: AC
  Administered 2017-06-23: 1 mg via INTRAVENOUS

## 2017-06-23 MED ORDER — HALOPERIDOL LACTATE 2 MG/ML PO CONC: 0.5000 mg | ORAL | Status: DC | PRN

## 2017-06-23 MED ORDER — NALOXONE HCL 2 MG/2ML IJ SOSY
PREFILLED_SYRINGE | INTRAMUSCULAR | Status: AC
  Filled 2017-06-23: qty 2

## 2017-06-23 MED ORDER — GLYCOPYRROLATE 1 MG PO TABS: 1.0000 mg | ORAL_TABLET | ORAL | Status: DC | PRN

## 2017-06-23 MED ORDER — EPINEPHRINE PF 1 MG/ML IJ SOLN: 1.0000 mg | Freq: Once | INTRAMUSCULAR | Status: DC

## 2017-06-23 MED ORDER — SODIUM CHLORIDE 0.9 % IV SOLN: 250.0000 mL | INTRAVENOUS | Status: DC | PRN

## 2017-06-23 MED ORDER — ONDANSETRON 4 MG PO TBDP: 4.0000 mg | ORAL_TABLET | Freq: Four times a day (QID) | ORAL | Status: DC | PRN

## 2017-06-23 MED ORDER — MORPHINE SULFATE (PF) 2 MG/ML IV SOLN
2.0000 mg | INTRAVENOUS | Status: DC | PRN
  Administered 2017-06-23 (×3): 2 mg via INTRAVENOUS
  Filled 2017-06-23 (×2): qty 1

## 2017-06-23 MED ORDER — CALCIUM CHLORIDE 10 % IV SOLN
1.0000 g | Freq: Once | INTRAVENOUS | Status: AC
  Administered 2017-06-23: 1 g via INTRAVENOUS

## 2017-06-23 MED ORDER — DEXTROSE 50 % IV SOLN
1.0000 | Freq: Once | INTRAVENOUS | Status: AC
  Administered 2017-06-23: 50 mL via INTRAVENOUS

## 2017-06-23 MED ORDER — ACETAMINOPHEN 325 MG PO TABS: 650.0000 mg | ORAL_TABLET | Freq: Four times a day (QID) | ORAL | Status: DC | PRN

## 2017-06-23 MED ORDER — SODIUM CHLORIDE 0.9% FLUSH: 3.0000 mL | Freq: Two times a day (BID) | INTRAVENOUS | Status: DC

## 2017-06-23 MED ORDER — POLYVINYL ALCOHOL 1.4 % OP SOLN: 1.0000 [drp] | Freq: Four times a day (QID) | OPHTHALMIC | Status: DC | PRN

## 2017-06-23 MED ORDER — SODIUM CHLORIDE 0.9% FLUSH: 3.0000 mL | INTRAVENOUS | Status: DC | PRN

## 2017-06-23 MED ORDER — LORAZEPAM 2 MG/ML IJ SOLN
INTRAMUSCULAR | Status: AC
  Filled 2017-06-23: qty 1

## 2017-06-26 MED FILL — Medication: Qty: 1 | Status: AC

## 2017-06-30 ENCOUNTER — Ambulatory Visit: Payer: Medicare Other | Admitting: Family Medicine

## 2017-07-15 NOTE — Death Summary Note (Addendum)
DEATH SUMMARY   Patient Details  Name: [name] MRN: [id number] DOB: 08-30-1928  Admission/Discharge Information   Admit Date:  07-12-17  Date of Death:  July 12, 2017  Time of Death:  10-11-1037  Length of Stay: 0  Referring Physician: Kathyrn Drown, MD   Reason(s) for Hospitalization  Dyspnea, Bradycardia, Acute on chronic respiratory failure  Diagnoses  Preliminary cause of death: Acute on chronic respiratory Failure Secondary Diagnoses (including complications and co-morbidities):   Acute on chronic respiratory failure with hypoxia -Patient is on 2 L nasal cannula at home -Noted to be hypoxic with oxygen saturation in the 50-60 range -Initially placed on BiPAP with continued hypoxia in 50s -Dr. Rolland Porter discussed with the patient's nephew (HPOA) regarding the pateint's poor prognosis in the setting of patient's numerous comorbidities and advanced age.  Her nephew expressed understand and did not want any aggressive measure to be performed. -After discussion with patient's family, the patient's focus of care was transitioned to focus on full comfort  Tachybradycardia syndrome -Patient previously admitted June 12, 2017 through June 20, 2017 -She was found to be a poor candidate for PPM by electrophysiology -Patient was discharged with metoprolol tartrate 12.5 mg twice daily and diltiazem CD 360 mg daily -The patient presented this admission with heart rate in the 20s with -Dr. Rolland Porter discussed with the patient's nephew (HPOA) regarding the pateint's poor prognosis in the setting of patient's numerous comorbidities and advanced age.  Her nephew expressed understand and did not want any aggressive measure to be performed. -After discussion with patient's family, the patient's focus of care was transitioned to focus on full comfort  Atrial fibrillation/atrial flutter--persistent -CHADSVASc = 4 -previously on Xarelto --Dr. Rolland Porter discussed with the patient's nephew  (HPOA) regarding the pateint's poor prognosis in the setting of patient's numerous comorbidities and advanced age.  Her nephew expressed understand and did not want any aggressive measure to be performed. -After discussion with patient's family, the patient's focus of care was transitioned to focus on full comfort  Hypothermia -initially placed on warming blanket --Dr. Rolland Porter discussed with the patient's nephew (HPOA) regarding the pateint's poor prognosis in the setting of patient's numerous comorbidities and advanced age.  Her nephew expressed understand and did not want any aggressive measure to be performed. -After discussion with patient's family, the patient's focus of care was transitioned to focus on full comfort  Interstitial lung disease with presumptive MAI -No need for respiratory isolation -No longer an active issue as the patient's focus of care has been transition to full comfort  CKD 3 -Dr. Rolland Porter discussed with the patient's nephew (HPOA) regarding the pateint's poor prognosis in the setting of patient's numerous comorbidities and advanced age.  Her nephew expressed understand and did not want any aggressive measure to be performed. -After discussion with patient's family, the patient's focus of care was transitioned to focus on full comfort  Gastroparesis/chronic pancreatitis -Dr. Rolland Porter discussed with the patient's nephew (HPOA) regarding the pateint's poor prognosis in the setting of patient's numerous comorbidities and advanced age.  Her nephew expressed understand and did not want any aggressive measure to be performed. -After discussion with patient's family, the patient's focus of care was transitioned to focus on full comfort  Severe protein calorie malnutrition/Body mass index is 15.23 kg/m. -Dr. Rolland Porter discussed with the patient's nephew (HPOA) regarding the pateint's poor prognosis in the setting of patient's numerous comorbidities and advanced age.  Her  nephew expressed understand and  did not want any aggressive measure to be performed. -After discussion with patient's family, the patient's focus of care was transitioned to focus on full comfort  Dementia without behavioral disturbance. -previously on Namenda 10 twice daily, citalopram 10, diazepam 2 nightly, Effexor 37.5 daily -Dr. Rolland Porter discussed with the patient's nephew (HPOA) regarding the pateint's poor prognosis in the setting of patient's numerous comorbidities and advanced age.  Her nephew expressed understand and did not want any aggressive measure to be performed. -After discussion with patient's family, the patient's focus of care was transitioned to focus on full comfort  Diabetes Mellitus type 2 -no longer active issue as pt's focus of care has been transitioned to focus on full comfort      Brief Hospital Course (including significant findings, care, treatment, and services provided and events leading to death)  [name] is a 84 y.o. year old female with a history of tachybradycardia syndrome, atrial fibrillation, COPD, chronic respiratory failure on 2 L, CKD stage III, dementia, diabetes mellitus type 2 presenting with shortness of breath and noted blood in the ostomy.  The patient was recently discharged from the hospital after a stay from June 12, 2017 through June 20, 2017.  During that hospitalization, the patient was treated for her atrial fibrillation and tachybradycardia syndrome as well as acute on chronic renal failure.  She was seen by cardiology and electrophysiology.  She was felt to be a poor candidate for aggressive interventions and pacemaker.  Patient was discharged home with home hospice.  The patient was brought to the emergency department on the morning of July 19, 2017 because of noted vomiting and shortness of breath by family.  Apparently they noted some blood in the patient's ostomy bag.  Upon arrival, EMS noted the patient's heart rate to  be 22-25.  Patient was given atropine 1 mg.  In the emergency department, the patient had an EKG with wide complex and prominent T waves concerning for hyperkalemia.  EDP, Dr. Rolland Porter, give the patient calcium gluconate, bicarbonate, insulin, and D50.  The patient continued to be bradycardic.  The patient was given additional atropine and subsequently epinephrine with improvement of her heart rate to 55.  The patient was noted to be encephalopathic and given Narcan.  She was noted to be hypothermic with a temperature of 92.7 F and hypoxic with oxygen saturation in the 50-60 range.  The patient initially was placed on BiPAP.  However her oxygen saturation remained in the 50s.  She was placed on a warming blanket.  Upon her healthcare power of attorney's arrival whom is her nephew, Dr. Rolland Porter, discussed the patient's plan of care with him.  The patient's very poor prognosis was discussed in the setting of multisystem organ failure, her advanced age, and established comorbidities.  After discussion with the patient's family, the patient's nephew decided that he did not want any further aggressive measures to be performed.  As a result, laboratory testing and chest x-ray was canceled.  Patient was transitioned back to a nonrebreather, and morphine was given for the patient's comfort.  Going forward, the patient's focus of care was transition to focus on full comfort.  I also discussed the family whom confirmed that they desired comfort care only.    Pertinent Labs and Studies  Significant Diagnostic Studies Dg Chest 2 View  Result Date: 05/26/2017 CLINICAL DATA:  Chest pain.  Cavitary lung disease. EXAM: CHEST  2 VIEW COMPARISON:  04/22/2017 FINDINGS: Heart size remains normal.  Chronic aortic atherosclerosis. Chronic lung disease with scarring and cavity formation as seen previously. Pulmonary density appears similar. No sign of consolidation or fluid accumulation within the cystic areas. No pleural  effusions. No acute bone finding. IMPRESSION: Advanced chronic lung disease with scarring and areas of cavitation. No sign of identifiable acute inflammation. A would certainly be possible for minor inflammatory changes to be hidden amongst the chronic densities. Electronically Signed   By: Nelson Chimes M.D.   On: 05/26/2017 13:10   Dg Esophagus  Result Date: 06/02/2017 CLINICAL DATA:  84 year old female with history of dysphagia. Excessive belching. EXAM: ESOPHOGRAM / BARIUM SWALLOW / BARIUM TABLET STUDY TECHNIQUE: Single contrast examination was performed using thin barium or water soluble. The patient was observed with fluoroscopy swallowing a 13 mm barium sulphate tablet. FLUOROSCOPY TIME:  Fluoroscopy Time:  2 minutes and 54 seconds Radiation Exposure Index (if provided by the fluoroscopic device): 6.2 mGy COMPARISON:  None. FINDINGS: Single contrast images of the esophagus demonstrate grossly abnormal esophageal motility with failure to propagate a normal primary peristaltic wave and extensive tertiary contractions. Distal esophagus appears dilated shortly above the gastroesophageal junction. Gastroesophageal junction appeared narrowed, with some adjacent surgical clips, which could indicate prior fundoplication procedure. Importantly, however, there was a persistent filling defect in the distal esophagus immediately above the gastroesophageal junction which could represent an underlying mass, or may simply reflect retained food products. A barium tablet was administered which failed to pass beyond this area of narrowing. IMPRESSION: 1. Nonspecific esophageal motility disorder with failure to propagate normal primary peristaltic waves and extensive tertiary contractions. 2. Narrowing of the esophagus in the region of the gastroesophageal junction. There are findings which could suggest prior fundoplication, however, the patient's surgical history is uncertain. Regardless, there is a persistent filling  defect immediately above the gastroesophageal junction, concerning for potential neoplasm versus retained food products. Correlation with endoscopy is recommended if clinically appropriate. Electronically Signed   By: Vinnie Langton M.D.   On: 06/02/2017 12:30   Dg Chest Port 1 View  Result Date: 06/12/2017 CLINICAL DATA:  Shortness of breath for 3 days, dizziness and weakness. Wheezing. EXAM: PORTABLE CHEST 1 VIEW COMPARISON:  Chest radiograph May 26, 2017. CT chest April 30, 2017 FINDINGS: Cardiac silhouette is mildly enlarged and unchanged. Calcified aortic knob. Increased lung volumes with diffusely coarsened interstitium, small bilateral pleural effusions RIGHT upper lobe cavitation and scarring with additional small cavitary nodules better seen on prior CT. Scattered parenchymal calcifications. No pneumothorax. Widened at LEFT acromioclavicular joint space may be postoperative. Surgical clips projected GE junction. IMPRESSION: Increasing small pleural effusions. Pleuroparenchymal scarring better characterized April 30, 2017, superimposed pneumonia possible. Mild cardiomegaly. Aortic Atherosclerosis (ICD10-I70.0). Electronically Signed   By: Elon Alas M.D.   On: 06/12/2017 03:37   Dg Swallowing Func-speech Pathology  Result Date: 05/29/2017 Objective Swallowing Evaluation: Type of Study: MBS-Modified Barium Swallow Study  Patient Details Name: VALENTINA ALCOSER MRN: [id number] Date of Birth: [birth year] Today's Date: 05/29/2017 Time: SLP Start Time (ACUTE ONLY): 1100 -SLP Stop Time (ACUTE ONLY): 1130 SLP Time Calculation (min) (ACUTE ONLY): 30 min Past Medical History: Past Medical History: Diagnosis Date . Anemia  . Cavitary lung disease 03/15/2015 . Chronic abdominal pain  . Chronic neck and back pain  . COPD (chronic obstructive pulmonary disease) ([place])  . GAD (generalized anxiety disorder)  . Gastroesophageal reflux disease  . Gastroparesis  . IBS (irritable bowel syndrome)  . Lung mass   . Migraines  . Osteoporosis  .  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 Renal insufficiency  . S/P colostomy ([place])  . Tachycardia  Past Surgical History: Past Surgical History: Procedure Laterality Date . ABDOMINAL HYSTERECTOMY   . APPENDECTOMY   . CHOLECYSTECTOMY   . COLOSTOMY   . ESOPHAGOGASTRODUODENOSCOPY   . ESOPHAGOGASTRODUODENOSCOPY N/A 08/25/2015  Procedure: ESOPHAGOGASTRODUODENOSCOPY (EGD);  Surgeon: Rogene Houston, MD;  Location: [facility];  Service: Endoscopy;  Laterality: N/A;  730 . ILEOSCOPY N/A 08/25/2015  Procedure: ILEOSCOPY THROUGH STOMA;  Surgeon: Rogene Houston, MD;  Location: [facility];  Service: Endoscopy;  Laterality: N/A; . PARTIAL GASTRECTOMY  1967  3/4 gastric resection for bleeding ulcers . TONSILLECTOMY   HPI: 84 year old female with a history of cavitary lung disease, hypothyroidism, admitted to the hospital with rapid atrial fibrillation. She had increasing palpitations and increased work of breathing for 4-5 days prior to admission. She was admitted to the stepdown unit, started on a Cardizem infusion. Cardiology following. Pt was schedule for outpatient MBSS on 05/26/17, but was admitted to hospital before it could be completed. MBSS ordered as inpatient by Dr. Bronson Ing.  Subjective: "I feel like I may throw up." She had ileostomy several years ago for colonic obstruction secondary to Alosetron and extensive adhesions. She has remote history of TB and cavitary lung disease UGI with small bowel 08/14/2015: Esophageal dysmotility compatible with age. Prior subtotal gastrectomy with small gastric pouch. Focal narrowing at the gastrojejunostomy anastomosis, long and tapered proximally with shouldering distally raising question ofmass/tumor ; endoscopic assessment recommended. She therefore underwent EGD and ileoscopy on 08/25/2015. EGD revealed small sliding hiatal hernia and mild changes of reflux esophagitis limited gastric junction. Small gastric pouch with patent gastro-jejunostomy.  Nonspecific finding of jejunal mucosal edema but biopsy was unremarkable. Current chest x-ray: Advanced chronic lung disease with scarring and areas of cavitation. Assessment / Plan / Recommendation CHL IP CLINICAL IMPRESSIONS 05/29/2017 Clinical Impression Pt presents with mild/mod oral phase dysphagia characterized by impaired mastication due to edentulous status and weak lingual manipulation resulting in prolonged oral phase and piecemeal deglutition. Pharyngeal phase is essentially WNL with flash penetration of thins during sequential cup sips with immediate removal and no aspiration observed. Suspect primary esophageal phase dysphagia as Pt noted to have distended, air-filled esophagus. Pt immediately vomited following sips of thin barium (started as belching and then vomited in four heaves- appeared to be breakfast and medication). MBSS was somewhat limited in intake due to Pt belching, regurgitation, and vomiting. Esophageal sweep completed several times throughout the study revealed esophageal distention and bird-beaking (this occurred after liquids appeared to backflow from stomach into esophagus). Would like radiologist to review imaging (Series #11, 12, and 13 labeled as cup thin sweep). Recommend D3/mech soft and thin liquids. Pt already consumes frequent, small meals due to previous gastrectomy. Pt appears at highest risk for post prandial aspiration at this time given regurgitation and suspected primary esophageal dysphagia. Reflux precautions should be adhered to.  SLP Visit Diagnosis Dysphagia, oral phase (R13.11);Dysphagia, pharyngoesophageal phase (R13.14) Attention and concentration deficit following -- Frontal lobe and executive function deficit following -- Impact on safety and function Mild aspiration risk;Risk for inadequate nutrition/hydration   CHL IP TREATMENT RECOMMENDATION 05/29/2017 Treatment Recommendations No treatment recommended at this time   Prognosis 05/29/2017 Prognosis for Safe  Diet Advancement Fair Barriers to Reach Goals (No Data) Barriers/Prognosis Comment -- CHL IP DIET RECOMMENDATION 05/29/2017 SLP Diet Recommendations Dysphagia 3 (Mech soft) solids;Thin liquid Liquid Administration via Cup Medication Administration Whole meds with puree Compensations Slow rate Postural  Changes Remain semi-upright after after feeds/meals (Comment);Seated upright at 90 degrees   CHL IP OTHER RECOMMENDATIONS 05/29/2017 Recommended Consults Consider GI evaluation;Consider esophageal assessment Oral Care Recommendations Oral care BID Other Recommendations Clarify dietary restrictions   CHL IP FOLLOW UP RECOMMENDATIONS 05/29/2017 Follow up Recommendations None   CHL IP FREQUENCY AND DURATION 12/21/2015 Speech Therapy Frequency (ACUTE ONLY) min 1 x/week Treatment Duration 1 week      CHL IP ORAL PHASE 05/29/2017 Oral Phase Impaired Oral - Pudding Teaspoon -- Oral - Pudding Cup -- Oral - Honey Teaspoon -- Oral - Honey Cup -- Oral - Nectar Teaspoon -- Oral - Nectar Cup -- Oral - Nectar Straw -- Oral - Thin Teaspoon -- Oral - Thin Cup -- Oral - Thin Straw -- Oral - Puree -- Oral - Mech Soft Impaired mastication;Weak lingual manipulation;Piecemeal swallowing;Delayed oral transit Oral - Regular -- Oral - Multi-Consistency -- Oral - Pill -- Oral Phase - Comment --  CHL IP PHARYNGEAL PHASE 05/29/2017 Pharyngeal Phase Impaired Pharyngeal- Pudding Teaspoon -- Pharyngeal -- Pharyngeal- Pudding Cup -- Pharyngeal -- Pharyngeal- Honey Teaspoon -- Pharyngeal -- Pharyngeal- Honey Cup -- Pharyngeal -- Pharyngeal- Nectar Teaspoon -- Pharyngeal -- Pharyngeal- Nectar Cup -- Pharyngeal -- Pharyngeal- Nectar Straw -- Pharyngeal -- Pharyngeal- Thin Teaspoon -- Pharyngeal -- Pharyngeal- Thin Cup Delayed swallow initiation-vallecula;Penetration/Aspiration during swallow Pharyngeal Material does not enter airway;Material enters airway, remains ABOVE vocal cords then ejected out Pharyngeal- Thin Straw -- Pharyngeal -- Pharyngeal-  Puree WFL;Delayed swallow initiation-vallecula Pharyngeal -- Pharyngeal- Mechanical Soft Delayed swallow initiation-vallecula;WFL Pharyngeal -- Pharyngeal- Regular -- Pharyngeal -- Pharyngeal- Multi-consistency -- Pharyngeal -- Pharyngeal- Pill WFL Pharyngeal -- Pharyngeal Comment Essentially WNL for age  CHL IP CERVICAL ESOPHAGEAL PHASE 05/29/2017 Cervical Esophageal Phase Impaired Pudding Teaspoon -- Pudding Cup -- Honey Teaspoon -- Honey Cup -- Nectar Teaspoon -- Nectar Cup -- Nectar Straw -- Thin Teaspoon -- Thin Cup Other (Comment) Thin Straw -- Puree -- Mechanical Soft -- Regular -- Multi-consistency -- Pill -- Cervical Esophageal Comment -- No flowsheet data found. PORTER,DABNEY 05/29/2017, 6:38 PM               Microbiology No results found for this or any previous visit (from the past 240 hour(s)).  Lab Basic Metabolic Panel: Recent Labs  Lab 06/18/17 0414  NA 139  K 4.4  CL 94*  CO2 36*  GLUCOSE 122*  BUN 20  CREATININE 1.02*  CALCIUM 9.7  MG 1.6*   Liver Function Tests: Recent Labs  Lab 06/18/17 0414  AST 19  ALT 15  ALKPHOS 95  BILITOT 0.6  PROT 6.8  ALBUMIN 3.0*   No results for input(s): LIPASE, AMYLASE in the last 168 hours. No results for input(s): AMMONIA in the last 168 hours. CBC: Recent Labs  Lab 06/18/17 0414  WBC 14.7*  NEUTROABS 8.3*  HGB 12.0  HCT 38.2  MCV 99.2  PLT 389   Cardiac Enzymes: No results for input(s): CKTOTAL, CKMB, CKMBINDEX, TROPONINI in the last 168 hours. Sepsis Labs: Recent Labs  Lab 06/18/17 0414  WBC 14.7*    Procedures/Operations  none   Shanon Brow Crayton Savarese 06-25-17, 7:57 AM

## 2017-07-15 NOTE — ED Notes (Signed)
cbg [id number]

## 2017-07-15 NOTE — ED Notes (Addendum)
Decision made for comfort care. Labs & xray cancelled. Pt removed from bypap, placed on nonrebreather mask. Family at beside. No needs voiced at this time.

## 2017-07-15 NOTE — Progress Notes (Signed)
Patient in agonal to labored breathing, tried to oxygenate patient without success. MD discontinued BiPAP , patient is DNI. Placed on 100% NRB mask.

## 2017-07-15 NOTE — Progress Notes (Signed)
Pt deceased at 33. Verified with another RN. MD made aware. Will sign death certificate. CDS called and # is [phone], pt not a candidate. Family at bedside. Will update me with funeral home arrangements.

## 2017-07-15 NOTE — ED Provider Notes (Signed)
[facility] EMERGENCY DEPARTMENT Provider Note   CSN: [id number] Arrival date & time: 2017-07-21  0214  Time seen 02:18 AM   History   Chief Complaint Chief Complaint  Patient presents with  . Respiratory Distress   Level 5 caveat for age  HPI [name] is a 84 y.o. female.  HPI EMS states they were called because patient had vomiting at home and they noticed some blood in her colostomy bag.  EMS reports on their arrival her heart rate was 22-25.  They gave her 1 mg of atropine which briefly increased her heart rate.  Patient is DNR and is in hospice care.  Patient has had recent hospital admissions for atrial fibrillation with fast ventricular response.  PCP Kathyrn Drown, MD   Patient is DNR  Past Medical History:  Diagnosis Date  . Anemia   . Atrial fibrillation ([place])   . 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 Chronic abdominal pain   . Chronic neck and back pain   . COPD (chronic obstructive pulmonary disease) ([place])   . GAD (generalized anxiety disorder)   . Gastroesophageal reflux disease   . Gastroparesis   . IBS (irritable bowel syndrome)   . Migraines   . Osteoporosis   . 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 Renal insufficiency   . S/P colostomy ([place])   . Tachycardia-bradycardia syndrome [facility])     Patient Active Problem List   Diagnosis Date Noted  . Acute on chronic congestive heart failure ([place])   . Palliative care encounter   . DNR (do not resuscitate)   . Encounter for hospice care discussion   . Tachycardia 06/12/2017  . Tachy-brady syndrome ([place]) 06/12/2017  . Chronic respiratory failure with hypoxia ([place]) 05/27/2017  . Interstitial lung disease ([place]) 05/27/2017  . Atrial fibrillation with rapid ventricular response ([place]) 05/26/2017  . Aortic atherosclerosis ([place]) 04/30/2017  . Depression with anxiety 06/10/2016  . Pubic ramus fracture, left, closed, initial encounter ([place]) 06/10/2016  . Pubic ramus fracture, right, closed, initial encounter ([facility])  06/10/2016  . COPD (chronic obstructive pulmonary disease) ([place]) 06/10/2016  . Hypertension 06/10/2016  . Diabetes mellitus ([place]) 06/10/2016  . Hyponatremia 06/10/2016  . Leukocytosis 06/10/2016  . Fall at home 06/10/2016  . Pelvic fracture ([place]) 06/10/2016  . Closed bilateral fracture of pubic rami ([place])   . Fall   . Cachexia ([place]) 04/23/2016  . Frailty 01/23/2016  . Abdominal pain 01/01/2016  . Acute respiratory failure with hypoxia ([place]) 12/20/2015  . Dyspnea 07/25/2015  . Adult failure to thrive 05/30/2015  . 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 Hemoptysis 01/15/2015  . Pernicious anemia 09/13/2014  . Prediabetes 12/17/2013  . Hypothyroidism 10/04/2013  . Muscle cramps 05/03/2013  . Lung mass 03/17/2013  . Chronic headaches 11/11/2012  . Weight loss 09/21/2012  . Diarrhea 05/11/2012  . Gastroparesis 11/26/2011  . Flatulence 11/26/2011  . Osteoporosis 11/26/2011  . Ileostomy in place [facility]) 11/26/2011  . SCIATICA 08/24/2007    Past Surgical History:  Procedure Laterality Date  . ABDOMINAL HYSTERECTOMY    . APPENDECTOMY    . CHOLECYSTECTOMY    . COLOSTOMY    . ESOPHAGOGASTRODUODENOSCOPY    . ESOPHAGOGASTRODUODENOSCOPY N/A 08/25/2015   Procedure: ESOPHAGOGASTRODUODENOSCOPY (EGD);  Surgeon: Rogene Houston, MD;  Location: [facility] ENDO SUITE;  Service: Endoscopy;  Laterality: N/A;  730  . ESOPHAGOGASTRODUODENOSCOPY N/A 06/03/2017   Procedure: ESOPHAGOGASTRODUODENOSCOPY (EGD);  Surgeon: Rogene Houston, MD;  Location: [facility] ENDO SUITE;  Service: Endoscopy;  Laterality: N/A;  .  FOREIGN BODY REMOVAL  06/03/2017   Procedure: FOREIGN BODY REMOVAL;  Surgeon: Rogene Houston, MD;  Location: [facility] ENDO SUITE;  Service: Endoscopy;;  . ILEOSCOPY N/A 08/25/2015   Procedure: ILEOSCOPY THROUGH STOMA;  Surgeon: Rogene Houston, MD;  Location: [facility] ENDO SUITE;  Service: Endoscopy;  Laterality: N/A;  . PARTIAL GASTRECTOMY  1967   3/4 gastric resection for bleeding ulcers  . TONSILLECTOMY      OB  History    Gravida Para Term Preterm AB Living             3   SAB TAB Ectopic Multiple Live Births                   Home Medications    Prior to Admission medications   Medication Sig Start Date End Date Taking? Authorizing Provider  acetaminophen (TYLENOL) 325 MG tablet Take 2 tablets (650 mg total) by mouth every 6 (six) hours as needed for mild pain or headache (or Fever >/= 101). 12/22/15   Samuella Cota, MD  chlorpheniramine-HYDROcodone [facility] PENNKINETIC ER) 10-8 MG/5ML SUER Take 5 mLs by mouth every 12 (twelve) hours as needed for cough. Patient not taking: Reported on 05/26/2017 03/06/17   Kathyrn Drown, MD  citalopram (CELEXA) 10 MG tablet TAKE (1) TABLET BY MOUTH ONCE DAILY. 06/16/17   Kathyrn Drown, MD  diazepam (VALIUM) 2 MG tablet Take 1 tablets twice a daily as needed. Take sparingly (Prescription must last 30 days) 03/06/17   Kathyrn Drown, MD  ipratropium (ATROVENT HFA) 17 MCG/ACT inhaler 2 PUFFS FOUR TIMES DAILY. Patient taking differently: Inhale 2 puffs into the lungs 4 (four) times daily.  09/20/16   Kathyrn Drown, MD  ipratropium (ATROVENT) 0.03 % nasal spray Place 2 sprays into both nostrils every 12 (twelve) hours. 04/29/17   Kathyrn Drown, MD  levothyroxine (SYNTHROID, LEVOTHROID) 25 MCG tablet TAKE 2 TABLETS BY MOUTH ON MONDAY AND FRIDAY. & 1 TABLET ALL OTHER DAYS 05/08/17   Kathyrn Drown, MD  lipase/protease/amylase (CREON) 12000 units CPEP capsule TAKE 2 CAPSULES BY MOUTH 3 TIMES DAILY WITH MEALS. TAKE 1 CAPSULE TWICE DAILY WITH SNACKS 04/23/17   Rehman, Mechele Dawley, MD  Melatonin 5 MG CAPS Take 5 mg by mouth at bedtime.    [provider]  memantine (NAMENDA) 10 MG tablet TAKE 1 TABLET BY MOUTH TWICE DAILY. 02/20/17   Kathyrn Drown, MD  metoCLOPramide (REGLAN) 5 MG tablet Take 1 tablet (5 mg total) by mouth 4 (four) times daily -  before meals and at bedtime. 06/04/17 06/04/18  Isaac Bliss, Rayford Halsted, MD  metoprolol tartrate (LOPRESSOR)  50 MG tablet Take 1 tablet (50 mg total) by mouth 2 (two) times daily. 06/20/17   Charlynne Cousins, MD  Morphine Sulfate (MORPHINE CONCENTRATE) 10 MG/0.5ML SOLN concentrated solution Place 0.25 mLs (5 mg total) under the tongue every hour as needed for anxiety or shortness of breath. 06/20/17   Charlynne Cousins, MD  mupirocin ointment Drue Stager) 2 % Apply to affected area 2 times daily 05/15/17 05/15/18  Kathyrn Drown, MD  OXYGEN Inhale into the lungs. 2 liters -Nasal Canual    [provider]  Rivaroxaban (XARELTO) 15 MG TABS tablet Take 1 tablet (15 mg total) by mouth daily with supper. 06/04/17   Isaac Bliss, Rayford Halsted, MD  Simethicone (GAS RELIEF PO) Take 1 tablet by mouth daily as needed (for gas relief).     [provider]  venlafaxine XR (EFFEXOR-XR) 37.5 MG 24 hr capsule TAKE 1 CAPSULE BY MOUTH ONCE DAILY WITH BREAKFAST 05/08/17   Kathyrn Drown, MD  verapamil (CALAN-SR) 180 MG CR tablet Take 1 tablet (180 mg total) by mouth daily at 2 PM. 06/20/17   Charlynne Cousins, MD    Family History Family History  Problem Relation Age of Onset  . Stroke Mother   . Stroke Father   . Diabetes Sister   . Diabetes Brother   . Stroke Brother   . Cancer Sister        unknown type  . Diabetes Sister   . Emphysema Sister   . Diabetes Brother   . Stroke Brother   . Diabetes Son   . Irritable bowel syndrome Son   . Diabetes Son   . Hypertension Son     Social History Social History   Tobacco Use  . Smoking status: Never Smoker  . Smokeless tobacco: Never Used  Substance Use Topics  . Alcohol use: No    Alcohol/week: 0.0 oz  . Drug use: No  pt lives at home   Allergies   Levaquin [levofloxacin]; Aspirin; Tetanus toxoid; Zolpidem tartrate; and Fosamax [alendronate sodium]   Review of Systems Review of Systems  Unable to perform ROS: Age     Physical Exam Updated Vital Signs BP (!) 120/53   Pulse (!) 41   Temp (!) 92.7 F (33.7 C) (Rectal)    Resp 19   Ht 4\' 11"  (1.499 m)   Wt 33.6 kg (74 lb)   SpO2 100%   BMI 14.95 kg/m   Physical Exam  Constitutional:  Frail elderly female who appears chronically ill, she is emaciated  HENT:  Head: Normocephalic and atraumatic.  Right Ear: External ear normal.  Left Ear: External ear normal.  Nose: Nose normal.  Eyes:  Pupils are small and equal  Cardiovascular: An irregular rhythm present. Bradycardia present.  I do not feel radial pulse however patient has a femoral pulse is faint  Pulmonary/Chest:  Patient is having agonal breathing  Musculoskeletal: She exhibits no edema or deformity.  Neurological:  Patient will answer some questions, however she does not talk in sentences  Skin:  Patient is cool to touch, she is dusky and pale and cyanotic  Psychiatric:  Unable to assess  Nursing note and vitals reviewed.    ED Treatments / Results  Labs (all labs ordered are listed, but only abnormal results are displayed) Labs Reviewed  CBG MONITORING, ED - Abnormal; Notable for the following components:      Result Value   Glucose-Capillary 242 (*)    All other components within normal limits  CBG MONITORING, ED   Laboratory interpretation all normal except for hyperglycemia   EKG  #1  EKG Interpretation  Date/Time:  2017/07/04 02:25:46 EST Ventricular Rate:  38 PR Interval:    QRS Duration: 150 QT Interval:  616 QTC Calculation: 490 R Axis:   126 Text Interpretation:  Sinus or ectopic atrial bradycardia Paired ventricular premature complexes Nonspecific intraventricular conduction delay Probable anteroseptal infarct, recent Since last tracing rate slower 12 Jun 2017 Confirmed by Rolland Porter [phone]) on Jul 04, 2017 2:45:01 AM       #2  EKG Interpretation  Date/Time:  2017-07-04 02:41:02 EST Ventricular Rate:  58 PR Interval:    QRS Duration: 142 QT Interval:  475 QTC Calculation: 467 R Axis:   125 Text Interpretation:  Unknown  rhythm, irregular rate  Right bundle branch block Anterior infarct, acute (LAD) Since last tracing of earlier today heart rate is faster Confirmed by Rolland Porter [phone]) on 2017-07-23 2:46:32 AM         Radiology No results found.  Procedures .Critical Care Performed by: Rolland Porter, MD Authorized by: Rolland Porter, MD   Critical care provider statement:    Critical care time (minutes):  31   Critical care was necessary to treat or prevent imminent or life-threatening deterioration of the following conditions:  Circulatory failure and respiratory failure   Critical care was time spent personally by me on the following activities:  Discussions with consultants, evaluation of patient's response to treatment, examination of patient, obtaining history from patient or surrogate, pulse oximetry, re-evaluation of patient's condition and review of old charts   (including critical care time)  Medications Ordered in ED Medications  calcium chloride injection 1 g (1 g Intravenous Given 2017-07-23 0223)  atropine 1 MG/10ML injection 1 mg (1 mg Intravenous Given 07-23-2017 0226)  naloxone [facility]) injection 1 mg (1 mg Intravenous Given Jul 23, 2017 0231)  EPINEPHrine (ADRENALIN) 1 MG/10ML injection 1 mg (1 mg Intravenous Given 23-Jul-2017 0226)  sodium bicarbonate injection 50 mEq (50 mEq Intravenous Given July 23, 2017 0234)  dextrose 50 % solution 50 mL (50 mLs Intravenous Given 2017-07-23 0234)  atropine 1 MG/10ML injection 1 mg (1 mg Intravenous Given July 23, 2017 0238)  EPINEPHrine (ADRENALIN) 1 MG/10ML injection 1 mg (1 mg Intravenous Given 07-23-17 0236)  insulin aspart (novoLOG) injection 10 Units (10 Units Intravenous Given 07/23/2017 0241)  morphine 4 MG/ML injection 4 mg (4 mg Intravenous Given 23-Jul-2017 0306)     Initial Impression / Assessment and Plan / ED Course  I have reviewed the triage vital signs and the nursing notes.  Pertinent labs & imaging results that were available during my care of the  patient were reviewed by me and considered in my medical decision making (see chart for details).     Patient was given IV calcium after reviewing her EKG she had a wide complex and prominent T waves for possible hyperkalemia.  Although when I look in her chart her last creatinine was in the 1 range.  She was also given a milligram of epinephrine and atropine.  Her heart rate started to increase shortly after she was given the calcium up to 34.  Hear rate at 2:28 AM after getting her calcium, atropine, and epinephrine her heart rate was 55.  She was given Narcan 1 mg IV after reviewing her medication list and seeing she is on morphine.  That she was given 1 amp of bicarb and 1 amp of D50 with 10 units of regular insulin.  Her CBG was 242.  She was started on BiPAP at 2:34 AM after her pulse ox dropped to 58% on a nonrebreather mask. She had assisted ventilations with BVM while getting BiPap set up.  Patient was noted to be hypothermic and she was placed on a rewarming blanket.  At 2:50 AM patient's nephew who is her POA is here.  Also 1 of her caregivers.  They states she started complaining of shortness of breath tonight.  She did not have any pain that she expressed to them.  They state she was admitted to hospice care on Dec 7. We discussed that the patient is dying, even on BiPAP now her pulse ox is in the 50s.  They state they realize that they have been told that she could go at any time.  They  do not want any aggressive measures to be done.  We are going to put her back on a nonrebreather mask and give her some morphine.  Family is going to stay at her bedside.  3 AM patient was placed back on a nonrebreather.  Laboratory testing and chest x-ray was canceled.  Am not going to give any more medications for her heart rate.  Patient was given morphine for comfort, no pressors for blood pressure support.   4:30 AM patient heart rate still in the 40s, her pulse ox is very variable.  I am going to talk to  the hospitalist about putting the patient upstairs, I feel like death is imminent however it could be several more hours.  Nephew is at bedside with 1 of her caregivers and they are in agreement.  04:50 AM Dr Dena Billet, hospitalist, will come see patient for admission for comfort care.   Final Clinical Impressions(s) / ED Diagnoses   Final diagnoses:  Bradycardia  Respiratory failure, unspecified chronicity, unspecified whether with hypoxia or hypercapnia [facility])  Hospice care    Plan admission for comfort care  Rolland Porter, MD, Barbette Or, MD 2017-07-04 [phone]

## 2017-07-15 NOTE — H&P (Signed)
History and Physical    Patient:  [name] [id number] 1928/10/29   Date of Admission:  July 17, 2017  PRIMARY CARE PROVIDER (PCP): Kathyrn Drown, MD   Patient coming from: Home   Chief Complaint:   HPI:  The patient is an 84 yo woman with severe  tachy-brady syndrome that was thoroughly investigated by EP during [facility] admission and found not to be a candidate for pacemaker, who just started with [facility] 3 days ago on 06/20/17, who woke up SOB at 2 am, came to ED after being directed by Hospice, and had pulse in 20s, obtunded. Patient remains obtunded so history is obtained from the patient's nephew (and healthcare power of atty) and the patient's caregiver, both at bedside, and the emergency department physician. Patient was in her usual state of health until waking up at 2 am and stating she couldn't breathe. Was in distress. Family called Hospice line and no bed was available at [facility], so EMS was activated to bring patient to the emergency department on behalf of hospice. Patient did not complain of chest pain. Regarding the patient's shortness of breath:   Onset: a few hours ago, at 2 am today. Duration: constant. Severity: severe. Context: Was sleeping just before awakening with shortness of breath.  Character/Quality: Respiratory distress, could not get a good breath. Alleviated by: Nothing. Exacerbated by: Nothing. Associated Symptoms: no complaint of chest pain. Patient did not give any other complaints.   Treatments: none at home except usual medications.   ED Course:   Patient arrived in critical condition with obtunded state, heart rate in 20s. DNR status and Hospice services noted. Patient was given acute treatment including epinephrine, IV calcium, O2 supplementation with 100% NRB and trial of BiPAP. After significant treatment in ED, patient developed an improved pulse in the 40s-50s. She remained obtunded. See extensive ED note.   Talked to  POA, her nephew, at bedside, and he elects comfort care only.    Past Medical History:  Diagnosis Date  . Anemia   . Atrial fibrillation ([place])   . 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 Chronic abdominal pain   . Chronic neck and back pain   . COPD (chronic obstructive pulmonary disease) ([place])   . GAD (generalized anxiety disorder)   . Gastroesophageal reflux disease   . Gastroparesis   . IBS (irritable bowel syndrome)   . Migraines   . Osteoporosis   . 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 Renal insufficiency   . S/P colostomy ([place])   . Tachycardia-bradycardia syndrome [facility])     Past Surgical History:  Procedure Laterality Date  . ABDOMINAL HYSTERECTOMY    . APPENDECTOMY    . CHOLECYSTECTOMY    . COLOSTOMY    . ESOPHAGOGASTRODUODENOSCOPY    . ESOPHAGOGASTRODUODENOSCOPY N/A 08/25/2015   Procedure: ESOPHAGOGASTRODUODENOSCOPY (EGD);  Surgeon: Rogene Houston, MD;  [facility];  Service: Endoscopy;  Laterality: N/A;  730  . ESOPHAGOGASTRODUODENOSCOPY N/A 06/03/2017   Procedure: ESOPHAGOGASTRODUODENOSCOPY (EGD);  Surgeon: Rogene Houston, MD;  Location: [facility];  Service: Endoscopy;  Laterality: N/A;  . FOREIGN BODY REMOVAL  06/03/2017   Procedure: FOREIGN BODY REMOVAL;  Surgeon: Rogene Houston, MD;  Location: [facility];  Service: Endoscopy;;  . ILEOSCOPY N/A 08/25/2015   Procedure: ILEOSCOPY THROUGH STOMA;  Surgeon: Rogene Houston, MD;  Location: [facility];  Service: Endoscopy;  Laterality: N/A;  . PARTIAL GASTRECTOMY  1967   3/4 gastric resection  for bleeding ulcers  . TONSILLECTOMY      Allergies  Allergen Reactions  . Levaquin [Levofloxacin] Other (See Comments)    Patient states that she became weak and extremely tired. Also, it made her real dizzy.  . Aspirin Nausea And Vomiting    Nausea/vomiting blood  . Tetanus Toxoid Hives and Swelling  . Zolpidem Tartrate     Not in right state of mind  . Fosamax [Alendronate Sodium] Other (See Comments)     Severe gastritis reflux.    No current facility-administered medications on file prior to encounter.    Current Outpatient Medications on File Prior to Encounter  Medication Sig Dispense Refill  . acetaminophen (TYLENOL) 325 MG tablet Take 2 tablets (650 mg total) by mouth every 6 (six) hours as needed for mild pain or headache (or Fever >/= 101).    . chlorpheniramine-HYDROcodone (TUSSIONEX PENNKINETIC ER) 10-8 MG/5ML SUER Take 5 mLs by mouth every 12 (twelve) hours as needed for cough. (Patient not taking: Reported on 05/26/2017) 60 mL 0  . citalopram (CELEXA) 10 MG tablet TAKE (1) TABLET BY MOUTH ONCE DAILY. 28 tablet 5  . diazepam (VALIUM) 2 MG tablet Take 1 tablets twice a daily as needed. Take sparingly (Prescription must last 30 days) 40 tablet 5  . ipratropium (ATROVENT HFA) 17 MCG/ACT inhaler 2 PUFFS FOUR TIMES DAILY. (Patient taking differently: Inhale 2 puffs into the lungs 4 (four) times daily. ) 12.9 g 5  . ipratropium (ATROVENT) 0.03 % nasal spray Place 2 sprays into both nostrils every 12 (twelve) hours. 30 mL 12  . levothyroxine (SYNTHROID, LEVOTHROID) 25 MCG tablet TAKE 2 TABLETS BY MOUTH ON MONDAY AND FRIDAY. & 1 TABLET ALL OTHER DAYS 36 tablet 11  . lipase/protease/amylase (CREON) 12000 units CPEP capsule TAKE 2 CAPSULES BY MOUTH 3 TIMES DAILY WITH MEALS. TAKE 1 CAPSULE TWICE DAILY WITH SNACKS 224 capsule 11  . Melatonin 5 MG CAPS Take 5 mg by mouth at bedtime.    . memantine (NAMENDA) 10 MG tablet TAKE 1 TABLET BY MOUTH TWICE DAILY. 74 tablet 10  . metoCLOPramide (REGLAN) 5 MG tablet Take 1 tablet (5 mg total) by mouth 4 (four) times daily -  before meals and at bedtime. 120 tablet 1  . metoprolol tartrate (LOPRESSOR) 50 MG tablet Take 1 tablet (50 mg total) by mouth 2 (two) times daily. 30 tablet 0  . Morphine Sulfate (MORPHINE CONCENTRATE) 10 MG/0.5ML SOLN concentrated solution Place 0.25 mLs (5 mg total) under the tongue every hour as needed for anxiety or shortness of  breath. 180 mL 0  . mupirocin ointment (BACTROBAN) 2 % Apply to affected area 2 times daily 22 g 1  . OXYGEN Inhale into the lungs. 2 liters -Nasal [place]    . Rivaroxaban (XARELTO) 15 MG TABS tablet Take 1 tablet (15 mg total) by mouth daily with supper. 30 tablet 2  . Simethicone (GAS RELIEF PO) Take 1 tablet by mouth daily as needed (for gas relief).     . venlafaxine XR (EFFEXOR-XR) 37.5 MG 24 hr capsule TAKE 1 CAPSULE BY MOUTH ONCE DAILY WITH BREAKFAST 30 capsule 11  . verapamil (CALAN-SR) 180 MG CR tablet Take 1 tablet (180 mg total) by mouth daily at 2 PM. 30 tablet 0     Social History   Socioeconomic History  . Marital status: Married    Spouse name: Not on file  . Number of children: Not on file  . Years of education: Not on file  .  Highest education level: Not on file  Social Needs  . Financial resource strain: Not on file  . Food insecurity - worry: Not on file  . Food insecurity - inability: Not on file  . Transportation needs - medical: Not on file  . Transportation needs - non-medical: Not on file  Occupational History  . Not on file  Tobacco Use  . Smoking status: Never Smoker  . Smokeless tobacco: Never Used  Substance and Sexual Activity  . Alcohol use: No    Alcohol/week: 0.0 oz  . Drug use: No  . Sexual activity: Not on file  Other Topics Concern  . Not on file  Social History Narrative  . Not on file    Family History  Problem Relation Age of Onset  . Stroke Mother   . Stroke Father   . Diabetes Sister   . Diabetes Brother   . Stroke Brother   . Cancer Sister        unknown type  . Diabetes Sister   . Emphysema Sister   . Diabetes Brother   . Stroke Brother   . Diabetes Son   . Irritable bowel syndrome Son   . Diabetes Son   . Hypertension Son      Review of Systems:   ROS: Unobtainable; patient obtunded.   Physical Exam:  Vitals:   2017-07-12 0311 July 12, 2017 0312 July 12, 2017 0400 2017-07-12 0415  BP:  (!) 113/16 (!) 120/53   Pulse:  (!) 52  (!) 42 (!) 41  Resp: (!) 24 20 (!) 24 19  Temp:      TempSrc:      SpO2: (!) 70%  99% 100%  Weight:      Height:        GENERAL: Ill-appearing, thin, well-developed.  HEENT: Normocephalic, atraumatic; pupils equal and round, large pupils, minimally reactive to light. Nares patent, without discharge or bleeding. No oropharyngeal lesions or erythema. Mucous membranes are dry.  NECK: is supple, no masses, trachea midline.  RESPIRATORY: Clear to auscultation bilaterally. Chest wall movements are symmetric. No use of accessory muscles to breathe. Tachypnea. Decreased breath sounds. Coarse breath sounds on left.  CARDIOVASCULAR: Normal S1, S2. No rubs, or gallops. PMI non-displaced. Carotids: no carotid bruits. Murmur 2/6 systolic. Irregular. Markedly  bradycardic. Radial pulses 1+ bilaterally. DP pulses 1+ bilaterally. Capillary refill 5-6 seconds.  Edema: none bilaterally.  GI: soft, nontender, non-distended, normal active bowel sounds. No hepatosplenomegaly.  INTEGUMENT: Clean, dry, and intact. No rashes. MUSCULOSKELETAL: No cyanosis. No clubbing. Edema: none bilaterally.  NEUROLOGICAL: Cranial nerves 2-12 grossly intact as best can be determined in this obtunded patient. Reflexes: 2+ bilaterally. Babinski: toes non-reactive bilaterally. Patient moving all extremities spontaneously but rarely. Non-verbal. Obtunded. Not responding to voice. Not following commands.   PSYCHIATRIC: Obtunded.  LYMPHATIC: No cervical lymphadenopathy. No supraclavicular lymphadenopathy.   Labs on Admission: I have personally reviewed following labs and imaging studies.  Results for orders placed or performed during the hospital encounter of 07/12/17 (from the past 24 hour(s))  CBG monitoring, ED   Collection Time: 2017/07/12  2:36 AM  Result Value Ref Range   Glucose-Capillary 242 (H) 65 - 99 mg/dL   Note: further labs were not obtained due to patient's status as comfort care.     Radiological Exams on  Admission:  No orders to display     EKG: Independently reviewed.    Assessment/Plan  No problem-specific Assessment & Plan notes found for this encounter.  Assessment:  1. Tachy-brady  syndrome, present on admission 2. Acute on chronic respiratory failure with hypoxia, present on admission 3. Failure to thrive, present on admission 4. Obtunded, present on admission 5. DNR. Hospice patient. Now comfort care.  Plan:  HOSPICE ADMISSION; COMFORT CARE. [facility] with on call Hospice staff member, who agreed with plan to bring patient in as comfort care under Hospice. Talked to POA, her nephew, at bedside, and he elects comfort care only. Transfer to Hospice house once bed available.    Discussed case in detail with patient's family and with emergency department physician.    DVT prophylaxis: Per Palliative care order set. Code Status:  DNR. Comfort care. Family Communication: With nephew and caregiver at bedside. Disposition Plan: Hospice House Consults called: Hospice, for Hospice admission    Patient requires admission due to risks, which include: Death, worsening pain and suffering.   Tacey Ruiz MD [facility] Pager [phone]

## 2017-07-15 NOTE — ED Triage Notes (Signed)
Pt brought in by [facility] for c/o breathing difficulty; pt also has blood noted to be in colostomy bag; sob started x 1 hour ago; O2 sats in the 80's with heart rate in the 30's; pt is alert and oriented

## 2017-07-15 NOTE — ED Notes (Signed)
Report given to Brittany, RN.

## 2017-07-15 NOTE — Progress Notes (Signed)
Pt has brown/ bloody thick secretions coming from mouth. Will set up suction to help clean up and make breathing easier.

## 2017-07-15 NOTE — ED Notes (Signed)
Caregiver states pt is becoming agitated. Pt placed on bedpan, did not use. Pur wick catheter placed & hooked to suction.

## 2017-07-15 DEATH — deceased

## 2017-09-02 ENCOUNTER — Ambulatory Visit (INDEPENDENT_AMBULATORY_CARE_PROVIDER_SITE_OTHER): Payer: Medicare Other | Admitting: Internal Medicine
